# Patient Record
Sex: Female | Born: 1985 | Race: Black or African American | Hispanic: No | Smoking: Former smoker
Health system: Southern US, Community
[De-identification: ages and names within clinical notes are randomized; demographics above are authoritative.]

## PROBLEM LIST (undated history)

## (undated) ENCOUNTER — Inpatient Hospital Stay (HOSPITAL_COMMUNITY): Payer: Self-pay

## (undated) DIAGNOSIS — O139 Gestational [pregnancy-induced] hypertension without significant proteinuria, unspecified trimester: Secondary | ICD-10-CM

## (undated) DIAGNOSIS — IMO0002 Reserved for concepts with insufficient information to code with codable children: Secondary | ICD-10-CM

## (undated) DIAGNOSIS — K219 Gastro-esophageal reflux disease without esophagitis: Secondary | ICD-10-CM

## (undated) DIAGNOSIS — F319 Bipolar disorder, unspecified: Secondary | ICD-10-CM

## (undated) DIAGNOSIS — F4310 Post-traumatic stress disorder, unspecified: Secondary | ICD-10-CM

## (undated) DIAGNOSIS — N159 Renal tubulo-interstitial disease, unspecified: Secondary | ICD-10-CM

## (undated) DIAGNOSIS — J45909 Unspecified asthma, uncomplicated: Secondary | ICD-10-CM

## (undated) DIAGNOSIS — F419 Anxiety disorder, unspecified: Secondary | ICD-10-CM

## (undated) DIAGNOSIS — R87619 Unspecified abnormal cytological findings in specimens from cervix uteri: Secondary | ICD-10-CM

## (undated) DIAGNOSIS — N390 Urinary tract infection, site not specified: Secondary | ICD-10-CM

## (undated) DIAGNOSIS — F32A Depression, unspecified: Secondary | ICD-10-CM

## (undated) DIAGNOSIS — F603 Borderline personality disorder: Secondary | ICD-10-CM

## (undated) DIAGNOSIS — Z3490 Encounter for supervision of normal pregnancy, unspecified, unspecified trimester: Secondary | ICD-10-CM

## (undated) DIAGNOSIS — Z975 Presence of (intrauterine) contraceptive device: Principal | ICD-10-CM

## (undated) DIAGNOSIS — F329 Major depressive disorder, single episode, unspecified: Secondary | ICD-10-CM

## (undated) HISTORY — PX: TOOTH EXTRACTION: SUR596

## (undated) HISTORY — DX: Unspecified asthma, uncomplicated: J45.909

## (undated) HISTORY — DX: Gastro-esophageal reflux disease without esophagitis: K21.9

## (undated) HISTORY — DX: Borderline personality disorder: F60.3

## (undated) HISTORY — DX: Morbid (severe) obesity due to excess calories: E66.01

## (undated) HISTORY — DX: Gestational (pregnancy-induced) hypertension without significant proteinuria, unspecified trimester: O13.9

## (undated) HISTORY — DX: Post-traumatic stress disorder, unspecified: F43.10

## (undated) HISTORY — DX: Presence of (intrauterine) contraceptive device: Z97.5

---
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
## 2007-03-29 ENCOUNTER — Emergency Department (HOSPITAL_COMMUNITY): Admission: EM | Admit: 2007-03-29 | Discharge: 2007-03-29 | Payer: Self-pay | Admitting: Emergency Medicine

## 2007-10-26 ENCOUNTER — Other Ambulatory Visit: Admission: RE | Admit: 2007-10-26 | Discharge: 2007-10-26 | Payer: Self-pay | Admitting: Obstetrics and Gynecology

## 2008-04-19 ENCOUNTER — Ambulatory Visit (HOSPITAL_COMMUNITY): Admission: RE | Admit: 2008-04-19 | Discharge: 2008-04-19 | Payer: Self-pay | Admitting: Obstetrics & Gynecology

## 2008-04-30 ENCOUNTER — Inpatient Hospital Stay (HOSPITAL_COMMUNITY): Admission: AD | Admit: 2008-04-30 | Discharge: 2008-05-03 | Payer: Self-pay | Admitting: Obstetrics and Gynecology

## 2008-11-07 ENCOUNTER — Other Ambulatory Visit: Admission: RE | Admit: 2008-11-07 | Discharge: 2008-11-07 | Payer: Self-pay | Admitting: Obstetrics and Gynecology

## 2008-12-20 ENCOUNTER — Emergency Department (HOSPITAL_COMMUNITY): Admission: EM | Admit: 2008-12-20 | Discharge: 2008-12-20 | Payer: Self-pay | Admitting: Emergency Medicine

## 2009-01-10 ENCOUNTER — Emergency Department (HOSPITAL_COMMUNITY): Admission: EM | Admit: 2009-01-10 | Discharge: 2009-01-10 | Payer: Self-pay | Admitting: Emergency Medicine

## 2009-01-13 ENCOUNTER — Encounter (HOSPITAL_COMMUNITY): Admission: RE | Admit: 2009-01-13 | Discharge: 2009-02-12 | Payer: Self-pay | Admitting: Emergency Medicine

## 2009-01-13 ENCOUNTER — Ambulatory Visit (HOSPITAL_COMMUNITY): Payer: Self-pay | Admitting: Emergency Medicine

## 2009-03-27 ENCOUNTER — Emergency Department (HOSPITAL_COMMUNITY): Admission: EM | Admit: 2009-03-27 | Discharge: 2009-03-27 | Payer: Self-pay | Admitting: Emergency Medicine

## 2009-05-16 ENCOUNTER — Emergency Department (HOSPITAL_COMMUNITY): Admission: EM | Admit: 2009-05-16 | Discharge: 2009-05-17 | Payer: Self-pay | Admitting: Emergency Medicine

## 2009-08-15 ENCOUNTER — Emergency Department (HOSPITAL_COMMUNITY): Admission: EM | Admit: 2009-08-15 | Discharge: 2009-08-15 | Payer: Self-pay | Admitting: Emergency Medicine

## 2009-11-10 ENCOUNTER — Other Ambulatory Visit: Admission: RE | Admit: 2009-11-10 | Discharge: 2009-11-10 | Payer: Self-pay | Admitting: Obstetrics and Gynecology

## 2010-02-26 ENCOUNTER — Emergency Department (HOSPITAL_COMMUNITY): Admission: EM | Admit: 2010-02-26 | Discharge: 2010-02-26 | Payer: Self-pay | Admitting: Emergency Medicine

## 2010-03-14 ENCOUNTER — Emergency Department (HOSPITAL_COMMUNITY): Admission: EM | Admit: 2010-03-14 | Discharge: 2010-03-14 | Payer: Self-pay | Admitting: Emergency Medicine

## 2010-05-21 ENCOUNTER — Emergency Department (HOSPITAL_COMMUNITY): Admission: EM | Admit: 2010-05-21 | Discharge: 2010-05-21 | Payer: Self-pay | Admitting: Emergency Medicine

## 2010-05-23 ENCOUNTER — Emergency Department (HOSPITAL_COMMUNITY): Admission: EM | Admit: 2010-05-23 | Discharge: 2010-05-23 | Payer: Self-pay | Admitting: Emergency Medicine

## 2010-05-26 ENCOUNTER — Emergency Department (HOSPITAL_COMMUNITY): Admission: EM | Admit: 2010-05-26 | Discharge: 2010-05-26 | Payer: Self-pay | Admitting: Emergency Medicine

## 2010-07-26 ENCOUNTER — Other Ambulatory Visit
Admission: RE | Admit: 2010-07-26 | Discharge: 2010-07-26 | Payer: Self-pay | Source: Home / Self Care | Admitting: Obstetrics and Gynecology

## 2010-08-12 ENCOUNTER — Encounter: Payer: Self-pay | Admitting: Emergency Medicine

## 2010-09-18 ENCOUNTER — Emergency Department (HOSPITAL_COMMUNITY): Payer: Self-pay

## 2010-09-18 ENCOUNTER — Emergency Department (HOSPITAL_COMMUNITY)
Admission: EM | Admit: 2010-09-18 | Discharge: 2010-09-19 | Disposition: A | Discharge status: Home / Self Care | Payer: Self-pay | Attending: Emergency Medicine | Admitting: Emergency Medicine

## 2010-09-18 DIAGNOSIS — R42 Dizziness and giddiness: Secondary | ICD-10-CM | POA: Insufficient documentation

## 2010-09-18 DIAGNOSIS — R079 Chest pain, unspecified: Secondary | ICD-10-CM | POA: Insufficient documentation

## 2010-09-18 DIAGNOSIS — F319 Bipolar disorder, unspecified: Secondary | ICD-10-CM | POA: Insufficient documentation

## 2010-09-18 DIAGNOSIS — Z79899 Other long term (current) drug therapy: Secondary | ICD-10-CM | POA: Insufficient documentation

## 2010-09-18 LAB — DIFFERENTIAL
Basophils Absolute: 0 10*3/uL (ref 0.0–0.1)
Basophils Relative: 0 % (ref 0–1)
Eosinophils Absolute: 0.2 10*3/uL (ref 0.0–0.7)
Eosinophils Relative: 2 % (ref 0–5)
Lymphocytes Relative: 30 % (ref 12–46)
Lymphs Abs: 3 10*3/uL (ref 0.7–4.0)
Monocytes Absolute: 0.8 10*3/uL (ref 0.1–1.0)
Monocytes Relative: 8 % (ref 3–12)
Neutro Abs: 6.1 10*3/uL (ref 1.7–7.7)
Neutrophils Relative %: 61 % (ref 43–77)

## 2010-09-18 LAB — CBC
Hemoglobin: 11.6 g/dL — ABNORMAL LOW (ref 12.0–15.0)
MCHC: 32.5 g/dL (ref 30.0–36.0)
MCV: 81.7 fL (ref 78.0–100.0)
Platelets: 256 10*3/uL (ref 150–400)
RBC: 4.37 MIL/uL (ref 3.87–5.11)

## 2010-09-18 LAB — URINALYSIS, ROUTINE W REFLEX MICROSCOPIC
Bilirubin Urine: NEGATIVE
Ketones, ur: NEGATIVE mg/dL
Nitrite: NEGATIVE
Protein, ur: NEGATIVE mg/dL
Specific Gravity, Urine: 1.02 (ref 1.005–1.030)
Urobilinogen, UA: 1 mg/dL (ref 0.0–1.0)

## 2010-09-18 LAB — POCT CARDIAC MARKERS
CKMB, poc: <1 ng/mL — ABNORMAL LOW (ref 1.0–8.0)
Myoglobin, poc: 44.2 ng/mL (ref 12–200)
Troponin i, poc: <0.05 ng/mL (ref 0.00–0.09)

## 2010-09-18 LAB — POCT PREGNANCY, URINE: Preg Test, Ur: NEGATIVE

## 2010-09-19 LAB — COMPREHENSIVE METABOLIC PANEL
ALT: 35 U/L (ref 0–35)
AST: 23 U/L (ref 0–37)
Albumin: 3.1 g/dL — ABNORMAL LOW (ref 3.5–5.2)
Alkaline Phosphatase: 70 U/L (ref 39–117)
BUN: 11 mg/dL (ref 6–23)
CO2: 27 mEq/L (ref 19–32)
Calcium: 8.9 mg/dL (ref 8.4–10.5)
Chloride: 107 mEq/L (ref 96–112)
Creatinine, Ser: 1 mg/dL (ref 0.4–1.2)
GFR calc Af Amer: >60 mL/min (ref >60)
GFR calc non Af Amer: >60 mL/min (ref >60)
Glucose, Bld: 95 mg/dL (ref 70–99)
Potassium: 3.7 mEq/L (ref 3.5–5.1)
Sodium: 141 mEq/L (ref 135–145)
Total Bilirubin: 0.3 mg/dL (ref 0.3–1.2)
Total Protein: 5.8 g/dL — ABNORMAL LOW (ref 6.0–8.3)

## 2010-10-03 ENCOUNTER — Emergency Department (HOSPITAL_COMMUNITY): Payer: Self-pay

## 2010-10-03 ENCOUNTER — Emergency Department (HOSPITAL_COMMUNITY)
Admission: EM | Admit: 2010-10-03 | Discharge: 2010-10-03 | Disposition: A | Discharge status: Home / Self Care | Payer: Self-pay | Attending: Emergency Medicine | Admitting: Emergency Medicine

## 2010-10-03 DIAGNOSIS — R110 Nausea: Secondary | ICD-10-CM | POA: Insufficient documentation

## 2010-10-03 DIAGNOSIS — H81319 Aural vertigo, unspecified ear: Secondary | ICD-10-CM | POA: Insufficient documentation

## 2010-10-03 LAB — CBC
HCT: 36.3 % (ref 36.0–46.0)
Hemoglobin: 11.8 g/dL — ABNORMAL LOW (ref 12.0–15.0)
MCH: 25.7 pg — ABNORMAL LOW (ref 26.0–34.0)
MCHC: 32.5 g/dL (ref 30.0–36.0)
MCV: 78.9 fL (ref 78.0–100.0)
Platelets: 235 10*3/uL (ref 150–400)
RBC: 4.6 MIL/uL (ref 3.87–5.11)
RDW: 14.9 % (ref 11.5–15.5)
WBC: 9.1 10*3/uL (ref 4.0–10.5)

## 2010-10-03 LAB — BASIC METABOLIC PANEL
BUN: 10 mg/dL (ref 6–23)
CO2: 28 mEq/L (ref 19–32)
Calcium: 8.8 mg/dL (ref 8.4–10.5)
Chloride: 106 mEq/L (ref 96–112)
Creatinine, Ser: 0.86 mg/dL (ref 0.4–1.2)
GFR calc Af Amer: >60 mL/min (ref >60)
GFR calc non Af Amer: >60 mL/min (ref >60)
Glucose, Bld: 82 mg/dL (ref 70–99)
Potassium: 3.7 mEq/L (ref 3.5–5.1)
Sodium: 140 mEq/L (ref 135–145)

## 2010-10-03 LAB — POCT PREGNANCY, URINE
Preg Test, Ur: NEGATIVE
Preg Test, Ur: NEGATIVE

## 2010-10-03 LAB — DIFFERENTIAL
Basophils Absolute: 0 10*3/uL (ref 0.0–0.1)
Basophils Relative: 0 % (ref 0–1)
Eosinophils Absolute: 0.1 10*3/uL (ref 0.0–0.7)
Eosinophils Relative: 1 % (ref 0–5)
Lymphocytes Relative: 21 % (ref 12–46)
Lymphs Abs: 1.9 10*3/uL (ref 0.7–4.0)
Monocytes Absolute: 0.7 10*3/uL (ref 0.1–1.0)
Monocytes Relative: 7 % (ref 3–12)
Neutro Abs: 6.5 10*3/uL (ref 1.7–7.7)
Neutrophils Relative %: 71 % (ref 43–77)

## 2010-10-03 LAB — CK TOTAL AND CKMB (NOT AT ARMC)
CK, MB: 1.5 ng/mL (ref 0.3–4.0)
Relative Index: 1.1 (ref 0.0–2.5)
Total CK: 139 U/L (ref 7–177)

## 2010-10-03 LAB — D-DIMER, QUANTITATIVE: D-Dimer, Quant: 0.42 ug/mL-FEU (ref 0.00–0.48)

## 2010-10-03 LAB — TROPONIN I: Troponin I: 0.01 ng/mL (ref 0.00–0.06)

## 2010-10-05 LAB — URINE MICROSCOPIC-ADD ON

## 2010-10-05 LAB — URINALYSIS, ROUTINE W REFLEX MICROSCOPIC
Bilirubin Urine: NEGATIVE
Bilirubin Urine: NEGATIVE
Hgb urine dipstick: NEGATIVE
Ketones, ur: NEGATIVE mg/dL
Nitrite: NEGATIVE
Specific Gravity, Urine: 1.025 (ref 1.005–1.030)
Specific Gravity, Urine: >1.03 — ABNORMAL HIGH (ref 1.005–1.030)
Urobilinogen, UA: 0.2 mg/dL (ref 0.0–1.0)
pH: 5.5 (ref 5.0–8.0)
pH: 6 (ref 5.0–8.0)

## 2010-10-05 LAB — COMPREHENSIVE METABOLIC PANEL
Alkaline Phosphatase: 82 U/L (ref 39–117)
BUN: 6 mg/dL (ref 6–23)
CO2: 30 mEq/L (ref 19–32)
Chloride: 105 mEq/L (ref 96–112)
Creatinine, Ser: 0.89 mg/dL (ref 0.4–1.2)
GFR calc non Af Amer: >60 mL/min (ref >60)
Glucose, Bld: 84 mg/dL (ref 70–99)
Potassium: 3.4 mEq/L — ABNORMAL LOW (ref 3.5–5.1)
Total Bilirubin: 0.3 mg/dL (ref 0.3–1.2)

## 2010-10-05 LAB — CBC
HCT: 39.3 % (ref 36.0–46.0)
MCH: 26.4 pg (ref 26.0–34.0)
MCV: 81.8 fL (ref 78.0–100.0)
RBC: 4.81 MIL/uL (ref 3.87–5.11)
WBC: 15.1 10*3/uL — ABNORMAL HIGH (ref 4.0–10.5)

## 2010-10-05 LAB — POCT PREGNANCY, URINE: Preg Test, Ur: NEGATIVE

## 2010-10-05 LAB — DIFFERENTIAL
Basophils Absolute: 0.1 10*3/uL (ref 0.0–0.1)
Basophils Relative: 1 % (ref 0–1)
Lymphocytes Relative: 16 % (ref 12–46)
Monocytes Absolute: 0.6 10*3/uL (ref 0.1–1.0)
Neutro Abs: 11.6 10*3/uL — ABNORMAL HIGH (ref 1.7–7.7)
Neutrophils Relative %: 77 % (ref 43–77)

## 2010-10-05 LAB — LIPASE, BLOOD: Lipase: 30 U/L (ref 11–59)

## 2010-10-07 LAB — CBC
HCT: 39.4 % (ref 36.0–46.0)
MCHC: 32.7 g/dL (ref 30.0–36.0)
MCV: 81.3 fL (ref 78.0–100.0)
Platelets: 231 10*3/uL (ref 150–400)
WBC: 9.5 10*3/uL (ref 4.0–10.5)

## 2010-10-07 LAB — URINE CULTURE: Colony Count: >=100000

## 2010-10-07 LAB — DIFFERENTIAL
Basophils Absolute: 0 10*3/uL (ref 0.0–0.1)
Lymphocytes Relative: 20 % (ref 12–46)
Neutro Abs: 7 10*3/uL (ref 1.7–7.7)

## 2010-10-07 LAB — COMPREHENSIVE METABOLIC PANEL
BUN: 9 mg/dL (ref 6–23)
CO2: 28 mEq/L (ref 19–32)
Chloride: 108 mEq/L (ref 96–112)
Creatinine, Ser: 0.83 mg/dL (ref 0.4–1.2)
GFR calc non Af Amer: >60 mL/min (ref >60)
Total Bilirubin: 0.3 mg/dL (ref 0.3–1.2)

## 2010-10-07 LAB — PREGNANCY, URINE: Preg Test, Ur: NEGATIVE

## 2010-10-07 LAB — URINALYSIS, ROUTINE W REFLEX MICROSCOPIC
Glucose, UA: NEGATIVE mg/dL
Ketones, ur: NEGATIVE mg/dL
Urobilinogen, UA: 0.2 mg/dL (ref 0.0–1.0)

## 2010-10-25 LAB — DIFFERENTIAL
Eosinophils Absolute: 0.1 10*3/uL (ref 0.0–0.7)
Eosinophils Relative: 1 % (ref 0–5)
Lymphs Abs: 1.2 10*3/uL (ref 0.7–4.0)
Monocytes Absolute: 0.3 10*3/uL (ref 0.1–1.0)

## 2010-10-25 LAB — BASIC METABOLIC PANEL
BUN: 10 mg/dL (ref 6–23)
Chloride: 109 mEq/L (ref 96–112)
Glucose, Bld: 93 mg/dL (ref 70–99)
Potassium: 3.9 mEq/L (ref 3.5–5.1)

## 2010-10-25 LAB — URINALYSIS, ROUTINE W REFLEX MICROSCOPIC
Bilirubin Urine: NEGATIVE
Glucose, UA: NEGATIVE mg/dL
Ketones, ur: NEGATIVE mg/dL
pH: 5.5 (ref 5.0–8.0)

## 2010-10-25 LAB — CBC
HCT: 44.6 % (ref 36.0–46.0)
MCV: 80.7 fL (ref 78.0–100.0)
Platelets: 233 10*3/uL (ref 150–400)
WBC: 12.8 10*3/uL — ABNORMAL HIGH (ref 4.0–10.5)

## 2010-10-26 LAB — URINALYSIS, ROUTINE W REFLEX MICROSCOPIC
Bilirubin Urine: NEGATIVE
Glucose, UA: NEGATIVE mg/dL
Ketones, ur: NEGATIVE mg/dL
Leukocytes, UA: NEGATIVE
Protein, ur: NEGATIVE mg/dL

## 2010-10-26 LAB — URINE MICROSCOPIC-ADD ON

## 2010-10-26 LAB — URINE CULTURE: Colony Count: 35000

## 2010-10-29 LAB — URINALYSIS, ROUTINE W REFLEX MICROSCOPIC
Nitrite: NEGATIVE
Specific Gravity, Urine: >1.03 — ABNORMAL HIGH (ref 1.005–1.030)
Urobilinogen, UA: 0.2 mg/dL (ref 0.0–1.0)

## 2010-10-29 LAB — URINE CULTURE

## 2010-10-29 LAB — URINE MICROSCOPIC-ADD ON

## 2010-10-29 LAB — PREGNANCY, URINE: Preg Test, Ur: NEGATIVE

## 2010-11-29 ENCOUNTER — Emergency Department (HOSPITAL_COMMUNITY)
Admission: EM | Admit: 2010-11-29 | Discharge: 2010-11-30 | Disposition: A | Discharge status: Home / Self Care | Payer: Self-pay | Attending: Emergency Medicine | Admitting: Emergency Medicine

## 2010-11-29 DIAGNOSIS — R110 Nausea: Secondary | ICD-10-CM | POA: Insufficient documentation

## 2010-11-29 DIAGNOSIS — R51 Headache: Secondary | ICD-10-CM | POA: Insufficient documentation

## 2010-11-29 LAB — URINALYSIS, ROUTINE W REFLEX MICROSCOPIC
Nitrite: POSITIVE — AB
Protein, ur: NEGATIVE mg/dL
Specific Gravity, Urine: >1.03 — ABNORMAL HIGH (ref 1.005–1.030)
Urobilinogen, UA: 0.2 mg/dL (ref 0.0–1.0)

## 2010-11-29 LAB — URINE MICROSCOPIC-ADD ON

## 2010-11-29 LAB — PREGNANCY, URINE: Preg Test, Ur: NEGATIVE

## 2010-12-04 NOTE — Op Note (Signed)
NAME:  BALJIT, LIEBERT               ACCOUNT NO.:  [id number]   MEDICAL RECORD NO.:  [id number]          PATIENT TYPE:  INP   LOCATION:  9126                          FACILITY:  [facility]   PHYSICIAN:  Tilda Burrow, M.D. DATE OF BIRTH:  1985/08/15   DATE OF PROCEDURE:  05/01/2008  DATE OF DISCHARGE:                               OPERATIVE REPORT   LABOR SUMMARY AND DELIVERY NOTE   Ms. Sachs was admitted last night for Cytotec cervical ripening with a  very firm cervix at 40 weeks 6 days for induction.  This morning, she  had made an excellent progress in response to Cytotec and progressed to  labor in a steady fashion.  She received IV Stadol and Phenergan 12.5 mg  x1 at 8 a.m.  Labor was noted for some decreased beat-to-beat  variability.  During active phase, labor with no decelerations.  Fetal  scalp stimulation always resulted in good fetal heart rate changes with  10 x 10 fetal accelerations noted.  She progressed to completely dilated  and pushed through a 35-minute second stage delivering over a deep  second-degree laceration with mild shoulder dystocia, delivering a 9-  pound 5-ounce female infant, Apgars of 8 and 9.  Dystocia was relieved  by identifying the infant's right shoulder, which was posteriorly  located and already in the posterior vaginal vault in combining the  McRobert maneuver with a counter clockwise with screw maneuver using  axillary traction to rotate the vertex steadily.  The baby delivered  easily.  There was significant partial third-degree perineal laceration  that extended well up to vagina.   The repair consisted of firstly delivering the placenta, intact Tomasa Blase  presentation.  Cord blood was collected by the [facility]  representative.   The repair consisted of using Allis clamps to identify the anal  sphincter capsule and repairing it with 3 interrupted figure-of-eight  sutures around the capsule and then a single suture in the center of  the  perineal body.  The remainder of the vaginal repair consisted of  standard two-layer repair beginning at the apex of the laceration well  up to the posterior vaginal wall just to the left of the midline pulling  the tissues together with a running suture.  The perineal body itself  was a two-layer closure with a running 0-Vicryl, and at the end, tissue  approximation was quite good.  EBL was 500 mL.  The patient tolerated  the procedure well under local anesthesia.   ADDENDUM:  Baby had a blood sugar of 36 in the recovery and was attempted early  breast-feeding unsuccessfully, so we took the baby to the nursery for  early feeding.  Baby was noted to have an Erb's palsy in Nursery, with  strong wrist and hand movement and grip, to be followed for resolution.  10.15/2009  Patient will be a candidate for treatment like a gestational diabetic in  future pregnancies; she weighed 10+ pounds at birth, her father is huge,  6'6 and 400plus pounds. Her 1hr GTT was wnl at 28 wks.  Would check 1hr  gtt at 34 wks in future pregnancies and 38 wk u/s for efwt.      Tilda Burrow, M.D.  Electronically Signed     JVF/MEDQ  D:  05/01/2008  T:  05/02/2008  Job:  [id number]   cc:   Francoise Schaumann. Raynelle Highland  Fax: [phone]   [facility]

## 2010-12-08 ENCOUNTER — Emergency Department (HOSPITAL_COMMUNITY)
Admission: EM | Admit: 2010-12-08 | Discharge: 2010-12-08 | Disposition: A | Discharge status: Home / Self Care | Payer: Self-pay | Attending: Emergency Medicine | Admitting: Emergency Medicine

## 2010-12-08 DIAGNOSIS — R112 Nausea with vomiting, unspecified: Secondary | ICD-10-CM | POA: Insufficient documentation

## 2010-12-08 DIAGNOSIS — K089 Disorder of teeth and supporting structures, unspecified: Secondary | ICD-10-CM | POA: Insufficient documentation

## 2011-02-28 ENCOUNTER — Inpatient Hospital Stay (INDEPENDENT_AMBULATORY_CARE_PROVIDER_SITE_OTHER)
Admission: RE | Admit: 2011-02-28 | Discharge: 2011-02-28 | Disposition: A | Discharge status: Home / Self Care | Payer: Self-pay | Source: Ambulatory Visit | Attending: Family Medicine | Admitting: Family Medicine

## 2011-02-28 DIAGNOSIS — N390 Urinary tract infection, site not specified: Secondary | ICD-10-CM

## 2011-02-28 LAB — POCT URINALYSIS DIP (DEVICE)
Bilirubin Urine: NEGATIVE
Glucose, UA: NEGATIVE mg/dL
Ketones, ur: NEGATIVE mg/dL
Specific Gravity, Urine: 1.02 (ref 1.005–1.030)

## 2011-03-02 LAB — URINE CULTURE
Colony Count: >=100000
Culture  Setup Time: 201208091949

## 2011-04-23 LAB — RH IMMUNE GLOB WKUP(>/=20WKS)(NOT WOMEN'S HOSP): Fetal Screen: NEGATIVE

## 2011-04-23 LAB — RPR: RPR Ser Ql: NONREACTIVE

## 2011-04-23 LAB — CBC
HCT: 26.6 — ABNORMAL LOW
HCT: 35.6 — ABNORMAL LOW
Hemoglobin: 8.7 — ABNORMAL LOW
MCHC: 32.7
MCHC: 32.7
MCV: 81
Platelets: 162
Platelets: 210
RDW: 15.5

## 2011-05-01 ENCOUNTER — Inpatient Hospital Stay (INDEPENDENT_AMBULATORY_CARE_PROVIDER_SITE_OTHER)
Admission: RE | Admit: 2011-05-01 | Discharge: 2011-05-01 | Disposition: A | Discharge status: Home / Self Care | Payer: Self-pay | Source: Ambulatory Visit | Attending: Family Medicine | Admitting: Family Medicine

## 2011-05-01 DIAGNOSIS — J069 Acute upper respiratory infection, unspecified: Secondary | ICD-10-CM

## 2011-05-01 LAB — POCT URINALYSIS DIP (DEVICE)
Glucose, UA: NEGATIVE mg/dL
Hgb urine dipstick: NEGATIVE
Ketones, ur: NEGATIVE mg/dL
Specific Gravity, Urine: 1.02 (ref 1.005–1.030)
Urobilinogen, UA: 0.2 mg/dL (ref 0.0–1.0)

## 2011-05-01 LAB — POCT PREGNANCY, URINE: Preg Test, Ur: NEGATIVE

## 2011-05-03 LAB — URINALYSIS, ROUTINE W REFLEX MICROSCOPIC
Nitrite: NEGATIVE
Specific Gravity, Urine: >1.03 — ABNORMAL HIGH
Urobilinogen, UA: 0.2
pH: 6

## 2011-05-03 LAB — PREGNANCY, URINE: Preg Test, Ur: NEGATIVE

## 2011-05-13 ENCOUNTER — Inpatient Hospital Stay (INDEPENDENT_AMBULATORY_CARE_PROVIDER_SITE_OTHER)
Admission: RE | Admit: 2011-05-13 | Discharge: 2011-05-13 | Disposition: A | Discharge status: Home / Self Care | Payer: Self-pay | Source: Ambulatory Visit | Attending: Emergency Medicine | Admitting: Emergency Medicine

## 2011-05-13 DIAGNOSIS — Z331 Pregnant state, incidental: Secondary | ICD-10-CM

## 2011-05-13 DIAGNOSIS — J3489 Other specified disorders of nose and nasal sinuses: Secondary | ICD-10-CM

## 2011-05-13 LAB — POCT PREGNANCY, URINE: Preg Test, Ur: POSITIVE

## 2011-05-14 ENCOUNTER — Emergency Department (HOSPITAL_COMMUNITY)
Admission: EM | Admit: 2011-05-14 | Discharge: 2011-05-14 | Disposition: A | Discharge status: Home / Self Care | Payer: Self-pay | Attending: Emergency Medicine | Admitting: Emergency Medicine

## 2011-05-14 DIAGNOSIS — F319 Bipolar disorder, unspecified: Secondary | ICD-10-CM | POA: Insufficient documentation

## 2011-05-14 DIAGNOSIS — R109 Unspecified abdominal pain: Secondary | ICD-10-CM | POA: Insufficient documentation

## 2011-05-14 DIAGNOSIS — O99891 Other specified diseases and conditions complicating pregnancy: Secondary | ICD-10-CM | POA: Insufficient documentation

## 2011-05-14 DIAGNOSIS — L299 Pruritus, unspecified: Secondary | ICD-10-CM | POA: Insufficient documentation

## 2011-05-14 DIAGNOSIS — L500 Allergic urticaria: Secondary | ICD-10-CM | POA: Insufficient documentation

## 2011-05-14 DIAGNOSIS — F411 Generalized anxiety disorder: Secondary | ICD-10-CM | POA: Insufficient documentation

## 2011-05-20 ENCOUNTER — Emergency Department (HOSPITAL_COMMUNITY)
Admission: EM | Admit: 2011-05-20 | Discharge: 2011-05-20 | Disposition: A | Discharge status: Home / Self Care | Payer: Medicaid Other | Attending: Emergency Medicine | Admitting: Emergency Medicine

## 2011-05-20 DIAGNOSIS — Z3201 Encounter for pregnancy test, result positive: Secondary | ICD-10-CM | POA: Insufficient documentation

## 2011-05-20 DIAGNOSIS — O219 Vomiting of pregnancy, unspecified: Secondary | ICD-10-CM | POA: Insufficient documentation

## 2011-05-20 LAB — URINALYSIS, ROUTINE W REFLEX MICROSCOPIC
Bilirubin Urine: NEGATIVE
Ketones, ur: NEGATIVE mg/dL
Leukocytes, UA: NEGATIVE
Nitrite: NEGATIVE
Protein, ur: NEGATIVE mg/dL
Urobilinogen, UA: 1 mg/dL (ref 0.0–1.0)

## 2011-05-20 LAB — POCT PREGNANCY, URINE: Preg Test, Ur: POSITIVE

## 2011-05-21 ENCOUNTER — Inpatient Hospital Stay (HOSPITAL_COMMUNITY): Payer: Medicaid Other

## 2011-05-21 ENCOUNTER — Inpatient Hospital Stay (HOSPITAL_COMMUNITY)
Admission: AD | Admit: 2011-05-21 | Discharge: 2011-05-21 | Disposition: A | Discharge status: Home / Self Care | Payer: Medicaid Other | Source: Ambulatory Visit | Attending: Obstetrics and Gynecology | Admitting: Obstetrics and Gynecology

## 2011-05-21 ENCOUNTER — Encounter (HOSPITAL_COMMUNITY): Payer: Self-pay

## 2011-05-21 DIAGNOSIS — O26899 Other specified pregnancy related conditions, unspecified trimester: Secondary | ICD-10-CM

## 2011-05-21 DIAGNOSIS — O99891 Other specified diseases and conditions complicating pregnancy: Secondary | ICD-10-CM | POA: Insufficient documentation

## 2011-05-21 DIAGNOSIS — R109 Unspecified abdominal pain: Secondary | ICD-10-CM | POA: Insufficient documentation

## 2011-05-21 LAB — HCG, QUANTITATIVE, PREGNANCY: hCG, Beta Chain, Quant, S: 10010 m[IU]/mL — ABNORMAL HIGH (ref <5)

## 2011-05-21 LAB — WET PREP, GENITAL
Trich, Wet Prep: NONE SEEN
Yeast Wet Prep HPF POC: NONE SEEN

## 2011-05-21 NOTE — Progress Notes (Signed)
Pt states was seen at [facility] yesterday afternoon, told she was pregnant, cultures completed, pt states pain only when voiding, noted spotting since in MAU. Rates 8/10. Denies burning, does have urgency with voiding. No abnormal vaginal d/c changes. Pain began this am.

## 2011-05-21 NOTE — ED Provider Notes (Signed)
History   Pt presents today c/o possible UTI. She states she has noticed lower abd pain and pressure with urination since about 2am today. She denies vag bleeding but states her urine is an orange/red color from taking pyridium. She denies fever. She states she has had recent intercourse. She also c/o some vag dc with irritation.  Chief Complaint  Patient presents with  . Back Pain   HPI  OB History    Grav Para Term Preterm Abortions TAB SAB Ect Mult Living   2 1 1  0 0 0 0 0 0 1      History reviewed. No pertinent past medical history.  Past Surgical History  Procedure Date  . Tooth extraction     No family history on file.  History  Substance Use Topics  . Smoking status: Former Smoker    Quit date: 05/21/2007  . Smokeless tobacco: Never Used  . Alcohol Use: Yes     none in 2 weeks    Allergies:  Allergies  Allergen Reactions  . Shellfish Allergy Anaphylaxis and Rash    No prescriptions prior to admission    Review of Systems  Constitutional: Negative for fever.  Cardiovascular: Negative for chest pain.  Gastrointestinal: Positive for abdominal pain. Negative for nausea, vomiting, diarrhea and constipation.  Genitourinary: Positive for urgency and frequency. Negative for dysuria, hematuria and flank pain.  Neurological: Negative for dizziness and headaches.  Psychiatric/Behavioral: Negative for depression and suicidal ideas.   Physical Exam   Blood pressure 121/82, pulse 101, temperature 97.1 F (36.2 C), temperature source Oral, resp. rate 18, height 5\' 6"  (1.676 m), weight 301 lb 6 oz (136.703 kg), last menstrual period 03/06/2011.  Physical Exam  Nursing note and vitals reviewed. Constitutional: She is oriented to person, place, and time. She appears well-developed and well-nourished. No distress.  HENT:  Head: Normocephalic and atraumatic.  Eyes: EOM are normal. Pupils are equal, round, and reactive to light.  GI: Soft. She exhibits no distension.  There is tenderness (Suprapubic tenderness noted with palpation.). There is no rebound and no guarding.  Genitourinary: No bleeding around the vagina. Vaginal discharge found.       Bimanual exam difficult secondary to increased body habitus.  Neurological: She is alert and oriented to person, place, and time.  Skin: Skin is warm and dry. She is not diaphoretic.  Psychiatric: She has a normal mood and affect. Her behavior is normal. Judgment and thought content normal.    MAU Course  Procedures  Wet prep and GC/Chlamydia cultures done.  Results for orders placed during the hospital encounter of 05/21/11 (from the past 24 hour(s))  HCG, QUANTITATIVE, PREGNANCY     Status: Abnormal   Collection Time   05/21/11  7:45 AM      Component Value Range   hCG, Beta Chain, Quant, S 10010 (*) <5 (mIU/mL)  ABO/RH     Status: Normal   Collection Time   05/21/11  7:45 AM      Component Value Range   ABO/RH(D) O NEG    WET PREP, GENITAL     Status: Abnormal   Collection Time   05/21/11  8:37 AM      Component Value Range   Yeast, Wet Prep NONE SEEN  NONE SEEN    Trich, Wet Prep NONE SEEN  NONE SEEN    Clue Cells, Wet Prep FEW (*) NONE SEEN    WBC, Wet Prep HPF POC FEW (*) NONE SEEN  US shows early IUP with yolk sac consistent with about 6wks. Assessment and Plan  Abd pain in preg: discussed with pt at length. Pt has an early IUP. She is to begin prenatal care. Discussed diet, activity, risks, and precautions.  Clinton Gallant. Karlea Mckibbin III, DrHSc, MPAS, PA-C  05/21/2011, 8:20 AM   Henrietta Hoover, PA 05/21/11 [phone]

## 2011-05-22 LAB — GC/CHLAMYDIA PROBE AMP, GENITAL
Chlamydia, DNA Probe: NEGATIVE
GC Probe Amp, Genital: NEGATIVE

## 2011-05-22 LAB — URINE CULTURE: Colony Count: 15000

## 2011-05-24 ENCOUNTER — Encounter (HOSPITAL_COMMUNITY): Payer: Self-pay

## 2011-06-04 ENCOUNTER — Encounter (HOSPITAL_COMMUNITY): Payer: Self-pay

## 2011-06-18 ENCOUNTER — Inpatient Hospital Stay (HOSPITAL_COMMUNITY)
Admission: AD | Admit: 2011-06-18 | Discharge: 2011-06-18 | Disposition: A | Discharge status: Home / Self Care | Payer: Medicaid Other | Source: Ambulatory Visit | Attending: Obstetrics & Gynecology | Admitting: Obstetrics & Gynecology

## 2011-06-18 ENCOUNTER — Encounter (HOSPITAL_COMMUNITY): Payer: Self-pay | Admitting: *Deleted

## 2011-06-18 DIAGNOSIS — K219 Gastro-esophageal reflux disease without esophagitis: Secondary | ICD-10-CM | POA: Insufficient documentation

## 2011-06-18 DIAGNOSIS — O99891 Other specified diseases and conditions complicating pregnancy: Secondary | ICD-10-CM | POA: Insufficient documentation

## 2011-06-18 HISTORY — DX: Unspecified abnormal cytological findings in specimens from cervix uteri: R87.619

## 2011-06-18 HISTORY — DX: Bipolar disorder, unspecified: F31.9

## 2011-06-18 HISTORY — DX: Major depressive disorder, single episode, unspecified: F32.9

## 2011-06-18 HISTORY — DX: Anxiety disorder, unspecified: F41.9

## 2011-06-18 HISTORY — DX: Depression, unspecified: F32.A

## 2011-06-18 HISTORY — DX: Reserved for concepts with insufficient information to code with codable children: IMO0002

## 2011-06-18 LAB — URINALYSIS, ROUTINE W REFLEX MICROSCOPIC
Bilirubin Urine: NEGATIVE
Ketones, ur: 15 mg/dL — AB
Nitrite: POSITIVE — AB
Specific Gravity, Urine: 1.02 (ref 1.005–1.030)
Urobilinogen, UA: 1 mg/dL (ref 0.0–1.0)
pH: 6 (ref 5.0–8.0)

## 2011-06-18 LAB — URINE MICROSCOPIC-ADD ON

## 2011-06-18 MED ORDER — PROMETHAZINE HCL 25 MG PO TABS: 25.0000 mg | ORAL_TABLET | Freq: Once | ORAL | Status: DC

## 2011-06-18 MED ORDER — PROMETHAZINE HCL 25 MG PO TABS: 25.0000 mg | ORAL_TABLET | Freq: Four times a day (QID) | ORAL | Status: DC | PRN

## 2011-06-18 MED ORDER — OMEPRAZOLE 20 MG PO CPDR: 20.0000 mg | DELAYED_RELEASE_CAPSULE | Freq: Every day | ORAL | Status: DC

## 2011-06-18 MED ORDER — ONDANSETRON 4 MG PO TBDP
4.0000 mg | ORAL_TABLET | Freq: Once | ORAL | Status: AC
  Administered 2011-06-18: 4 mg via ORAL
  Filled 2011-06-18: qty 1

## 2011-06-18 NOTE — Progress Notes (Signed)
About 10days ago, was taking an acid reducer, that was helping keep food down.  Was helping for a while.  Then 2 days ago, got sick of it and decided I wasn't going to eat. Been throwing up since last night.  Sore throat and cough started day before yesterday, non-prod cough. Coughing gags her.

## 2011-06-18 NOTE — Progress Notes (Signed)
Denies fever or chills.  Though sore throat and cough was from throwing up

## 2011-06-18 NOTE — ED Provider Notes (Signed)
History     No chief complaint on file.  HPI This is a G2 P1 001 at 10 weeks who presents with ferning in her throat, nausea, vomiting that started approximately 10 days ago. She has been treated with over-the-counter medicine for acid reflux with ranitidine with good success until about 2 days ago. Medicine stopped working and she developed vomiting since that time. She is not able to keep food down but has tried different foods, drinking milk, and other remedies to no avail. She denies fevers, chills, diarrhea, constipation, body aches, muscle aches. She does have dry cough and scratchy throat that started approximately same time as her uncontrolled reflux.  OB History    Grav Para Term Preterm Abortions TAB SAB Ect Mult Living   2 1 1  0 0 0 0 0 0 1      Past Medical History  Diagnosis Date  . Abnormal Pap smear   . Anxiety   . Depression   . Bipolar 1 disorder     Past Surgical History  Procedure Date  . Tooth extraction     No family history on file.  History  Substance Use Topics  . Smoking status: Former Smoker    Quit date: 05/21/2007  . Smokeless tobacco: Never Used  . Alcohol Use: Yes     none in 2 weeks    Allergies:  Allergies  Allergen Reactions  . Shellfish Allergy Anaphylaxis and Rash    Prescriptions prior to admission  Medication Sig Dispense Refill  . folic acid (FOLVITE) 400 MCG tablet Take 400 mcg by mouth daily.        . Mirtazapine (REMERON PO) Take 1 tablet by mouth daily. Pt's states that she is on the lowest dose, goes to [facility].  Cannot verify at this time due to them being closed. Will follow up       . multivitamin (BARIATRIC VIT W/EXTRA C) CHEW Chew 2 tablets by mouth daily.          Review of Systems  All other systems reviewed and are negative.   Physical Exam   Blood pressure 104/75, pulse 131, temperature 99.2 F (37.3 C), temperature source Oral, resp. rate 24, height 5' 4.5" (1.638 m), weight 129.729 kg (286 lb), last  menstrual period 03/06/2011, SpO2 96.00%.  Physical Exam  Constitutional: She is oriented to person, place, and time. She appears well-developed and well-nourished.  HENT:  Head: Normocephalic and atraumatic.  Eyes: Pupils are equal, round, and reactive to light.  Neck:       Cobblestoning  Cardiovascular: Normal rate, regular rhythm and normal heart sounds.   Respiratory: Effort normal and breath sounds normal. No respiratory distress. She has no wheezes. She has no rales. She exhibits no tenderness.  GI: Soft. Bowel sounds are normal. She exhibits no distension and no mass. There is no tenderness. There is no rebound and no guarding.  Musculoskeletal: Normal range of motion.  Neurological: She is alert and oriented to person, place, and time.  Skin: Skin is warm and dry.    MAU Course  Procedures   Assessment and Plan  #50 25 year old G2 P1 at 10 weeks #2 gastroesophageal reflux disease  The patient was given Zofran ODT to help with nausea. Patient tolerated oral liquids without difficulty. Also the patient home with Phenergan tablets, as well as omeprazole as the H2-blocker has become ineffective. She will be calling for an initial prenatal care appointments in the near future.  Jerry Haugen JEHIEL  06/18/2011, 1:06 PM

## 2011-06-28 ENCOUNTER — Inpatient Hospital Stay (HOSPITAL_COMMUNITY): Payer: Medicaid Other

## 2011-06-28 ENCOUNTER — Encounter (HOSPITAL_COMMUNITY): Payer: Self-pay | Admitting: *Deleted

## 2011-06-28 ENCOUNTER — Inpatient Hospital Stay (HOSPITAL_COMMUNITY)
Admission: AD | Admit: 2011-06-28 | Discharge: 2011-06-28 | Disposition: A | Discharge status: Home / Self Care | Payer: Medicaid Other | Source: Ambulatory Visit | Attending: Family Medicine | Admitting: Family Medicine

## 2011-06-28 DIAGNOSIS — N390 Urinary tract infection, site not specified: Secondary | ICD-10-CM

## 2011-06-28 DIAGNOSIS — O039 Complete or unspecified spontaneous abortion without complication: Secondary | ICD-10-CM | POA: Insufficient documentation

## 2011-06-28 DIAGNOSIS — O034 Incomplete spontaneous abortion without complication: Secondary | ICD-10-CM

## 2011-06-28 LAB — CBC
HCT: 34.6 % — ABNORMAL LOW (ref 36.0–46.0)
Hemoglobin: 11.6 g/dL — ABNORMAL LOW (ref 12.0–15.0)
MCH: 24.6 pg — ABNORMAL LOW (ref 26.0–34.0)
MCV: 73.5 fL — ABNORMAL LOW (ref 78.0–100.0)
Platelets: 125 10*3/uL — ABNORMAL LOW (ref 150–400)
RBC: 4.71 MIL/uL (ref 3.87–5.11)
WBC: 13.6 10*3/uL — ABNORMAL HIGH (ref 4.0–10.5)

## 2011-06-28 LAB — URINALYSIS, ROUTINE W REFLEX MICROSCOPIC
Bilirubin Urine: NEGATIVE
Glucose, UA: NEGATIVE mg/dL
Specific Gravity, Urine: 1.025 (ref 1.005–1.030)

## 2011-06-28 LAB — URINE MICROSCOPIC-ADD ON

## 2011-06-28 MED ORDER — CEPHALEXIN 500 MG PO CAPS: 500.0000 mg | ORAL_CAPSULE | Freq: Three times a day (TID) | ORAL | Status: AC

## 2011-06-28 MED ORDER — MISOPROSTOL 200 MCG PO TABS
600.0000 ug | ORAL_TABLET | Freq: Once | ORAL | Status: AC
  Administered 2011-06-28: 600 ug via VAGINAL
  Filled 2011-06-28: qty 3

## 2011-06-28 MED ORDER — OXYCODONE-ACETAMINOPHEN 5-325 MG PO TABS: 1.0000 | ORAL_TABLET | Freq: Four times a day (QID) | ORAL | Status: DC | PRN

## 2011-06-28 MED ORDER — HYDROMORPHONE HCL PF 1 MG/ML IJ SOLN
1.0000 mg | Freq: Once | INTRAMUSCULAR | Status: AC
  Administered 2011-06-28: 1 mg via INTRAMUSCULAR
  Filled 2011-06-28: qty 1

## 2011-06-28 MED ORDER — RHO D IMMUNE GLOBULIN 1500 UNIT/2ML IJ SOLN
300.0000 ug | Freq: Once | INTRAMUSCULAR | Status: AC
  Administered 2011-06-28: 300 ug via INTRAMUSCULAR

## 2011-06-28 MED ORDER — IBUPROFEN 800 MG PO TABS: 800.0000 mg | ORAL_TABLET | Freq: Three times a day (TID) | ORAL | Status: AC | PRN

## 2011-06-28 MED ORDER — ONDANSETRON 8 MG PO TBDP
8.0000 mg | ORAL_TABLET | Freq: Once | ORAL | Status: AC
  Administered 2011-06-28: 8 mg via ORAL
  Filled 2011-06-28: qty 1

## 2011-06-28 NOTE — ED Provider Notes (Signed)
History     Chief Complaint  Patient presents with  . Vaginal Bleeding   HPI 25 y.o. G2P1001 at [redacted]w[redacted]d with heavy vaginal bleeding, onset about 1 hour ago, c/o ongoing low abd and back pain, which she states has been present throughout this pregnancy. Blood type O neg.     Past Medical History  Diagnosis Date  . Abnormal Pap smear   . Anxiety   . Depression   . Bipolar 1 disorder     Past Surgical History  Procedure Date  . Tooth extraction     No family history on file.  History  Substance Use Topics  . Smoking status: Former Smoker    Quit date: 05/21/2007  . Smokeless tobacco: Never Used  . Alcohol Use: Yes     none in 2 weeks    Allergies:  Allergies  Allergen Reactions  . Shellfish Allergy Anaphylaxis and Rash    Prescriptions prior to admission  Medication Sig Dispense Refill  . multivitamin (BARIATRIC VIT W/EXTRA C) CHEW Chew 2 tablets by mouth daily.        Marland Kitchen omeprazole (PRILOSEC) 20 MG capsule Take 1 capsule (20 mg total) by mouth daily.  30 capsule  0    Review of Systems  Constitutional: Negative.   Respiratory: Negative.   Cardiovascular: Negative.   Gastrointestinal: Positive for abdominal pain. Negative for nausea, vomiting, diarrhea and constipation.  Genitourinary: Negative for dysuria, urgency, frequency, hematuria and flank pain.       Positive for vaginal bleeding  Musculoskeletal: Positive for back pain.  Neurological: Negative.   Psychiatric/Behavioral: Negative.    Physical Exam   Blood pressure 113/81, pulse 127, temperature 100.1 F (37.8 C), temperature source Oral, resp. rate 20, last menstrual period 03/06/2011, SpO2 95.00%.  Physical Exam  Nursing note and vitals reviewed. Constitutional: She appears well-developed and well-nourished. No distress.  Cardiovascular: Normal rate.   Respiratory: Effort normal.  GI: Soft.  Genitourinary: There is no tenderness or lesion on the right labia. There is no tenderness or lesion on  the left labia. There is bleeding (clots removed from cervix, moderate amout of bleeding, no active bleeding after removal of clots from cervix) around the vagina.       Cervix open about 1 cm    MAU Course  Procedures  Results for orders placed during the hospital encounter of 06/28/11 (from the past 24 hour(s))  CBC     Status: Abnormal   Collection Time   06/28/11  3:30 AM      Component Value Range   WBC 13.6 (*) 4.0 - 10.5 (K/uL)   RBC 4.71  3.87 - 5.11 (MIL/uL)   Hemoglobin 11.6 (*) 12.0 - 15.0 (g/dL)   HCT 96.0 (*) 45.4 - 46.0 (%)   MCV 73.5 (*) 78.0 - 100.0 (fL)   MCH 24.6 (*) 26.0 - 34.0 (pg)   MCHC 33.5  30.0 - 36.0 (g/dL)   RDW 09.8  11.9 - 14.7 (%)   Platelets 125 (*) 150 - 400 (K/uL)  RH IG WORKUP, [facility]     Status: Normal (Preliminary result)   Collection Time   06/28/11  3:30 AM      Component Value Range   Gestational Age(Wks) 11.3     ABO/RH(D) O NEG     Antibody Screen NEG     Unit Number [id number]     Blood Component Type RHIG     Unit division 00     Status of Unit  ISSUED     Transfusion Status OK TO TRANSFUSE    URINALYSIS, ROUTINE W REFLEX MICROSCOPIC     Status: Abnormal   Collection Time   06/28/11  4:20 AM      Component Value Range   Color, Urine YELLOW  YELLOW    APPearance CLEAR  CLEAR    Specific Gravity, Urine 1.025  1.005 - 1.030    pH 5.5  5.0 - 8.0    Glucose, UA NEGATIVE  NEGATIVE (mg/dL)   Hgb urine dipstick MODERATE (*) NEGATIVE    Bilirubin Urine NEGATIVE  NEGATIVE    Ketones, ur NEGATIVE  NEGATIVE (mg/dL)   Protein, ur 578 (*) NEGATIVE (mg/dL)   Urobilinogen, UA 4.0 (*) 0.0 - 1.0 (mg/dL)   Nitrite POSITIVE (*) NEGATIVE    Leukocytes, UA MODERATE (*) NEGATIVE   URINE MICROSCOPIC-ADD ON     Status: Abnormal   Collection Time   06/28/11  4:20 AM      Component Value Range   Squamous Epithelial / LPF RARE  RARE    WBC, UA TOO NUMEROUS TO COUNT  <3 (WBC/hpf)   RBC / HPF TOO NUMEROUS TO COUNT  <3 (RBC/hpf)   Bacteria,  UA MANY (*) RARE    US Ob Comp Less 14 Wks  06/28/2011  *RADIOLOGY REPORT*  Clinical Data: Heavy vaginal bleeding.  OBSTETRIC <14 WK ULTRASOUND  Technique:  Transabdominal ultrasound was performed for evaluation of the gestation as well as the maternal uterus and adnexal regions.  Comparison:  Pelvic ultrasound performed 05/21/2011  Intrauterine gestational sac: None seen. Yolk sac: N/A Embryo: N/A Cardiac Activity: N/A  Maternal uterus/Adnexae: There is a large amount of high echogenicity material within the uterus, concerning for clot.  The endometrial echo complex measures 4.2 cm in thickness.  Focally increased blood flow is noted anteriorly on the right side of the uterus, raising concern for underlying retained products of conception.  The ovaries are unremarkable in appearance.  The right ovary measures 3.4 x 2.2 x 2.7 cm, while the left ovary measures 3.2 x 2.0 x 2.0 cm.  No suspicious adnexal masses are seen.  There is no definite evidence for ovarian torsion.  No free fluid is seen within the pelvic cul-de-sac.  IMPRESSION: No intrauterine gestational sac seen.  Large amount of clot noted within the uterus, with thickening of the endometrial echo complex to 4.2 cm.  Focally increased blood flow noted anteriorly on the right side of the uterus, raising concern for underlying retained products of conception.  Findings were discussed with Georges Mouse NP at 05:44 a.m. on 06/28/2011.  Original Report Authenticated By: Tonia Ghent, M.D.     Assessment and Plan  25 y.o. G2P1001 at [redacted]w[redacted]d SAB - possible retained POC on u/s, Cytotec administered in MAU O neg - rhogam administered UTI - rx Keflex  F/U in GYN clinic in 2 weeks  Lindzee Gouge 06/28/2011, 5:03 AM

## 2011-06-28 NOTE — Progress Notes (Signed)
Pt G2 P1 at 11.3wks woke up with vaginal bleeding, having cramping and lower back pain.

## 2011-06-28 NOTE — Progress Notes (Signed)
Pt arrived via EMS

## 2011-06-28 NOTE — ED Notes (Signed)
N. Frazier CNM in to see pt. 

## 2011-06-28 NOTE — ED Provider Notes (Signed)
Chart reviewed and agree with management and plan.  

## 2011-06-29 LAB — RH IG WORKUP (INCLUDES ABO/RH)
Antibody Screen: NEGATIVE
Unit division: 0

## 2011-06-30 LAB — URINE CULTURE: Culture  Setup Time: 201212071439

## 2011-07-03 ENCOUNTER — Ambulatory Visit: Payer: Medicaid Other | Admitting: Advanced Practice Midwife

## 2011-07-18 ENCOUNTER — Inpatient Hospital Stay (HOSPITAL_COMMUNITY)
Admission: EM | Admit: 2011-07-18 | Discharge: 2011-07-24 | DRG: 690 | Disposition: A | Discharge status: Home / Self Care | Payer: Medicaid Other | Attending: Family Medicine | Admitting: Family Medicine

## 2011-07-18 ENCOUNTER — Inpatient Hospital Stay (HOSPITAL_COMMUNITY): Payer: Medicaid Other

## 2011-07-18 ENCOUNTER — Encounter (HOSPITAL_COMMUNITY): Payer: Self-pay | Admitting: *Deleted

## 2011-07-18 ENCOUNTER — Emergency Department (HOSPITAL_COMMUNITY): Payer: Medicaid Other

## 2011-07-18 DIAGNOSIS — N172 Acute kidney failure with medullary necrosis: Secondary | ICD-10-CM

## 2011-07-18 DIAGNOSIS — R1110 Vomiting, unspecified: Secondary | ICD-10-CM

## 2011-07-18 DIAGNOSIS — R161 Splenomegaly, not elsewhere classified: Secondary | ICD-10-CM

## 2011-07-18 DIAGNOSIS — K219 Gastro-esophageal reflux disease without esophagitis: Secondary | ICD-10-CM

## 2011-07-18 DIAGNOSIS — B9789 Other viral agents as the cause of diseases classified elsewhere: Secondary | ICD-10-CM | POA: Diagnosis present

## 2011-07-18 DIAGNOSIS — E871 Hypo-osmolality and hyponatremia: Secondary | ICD-10-CM | POA: Diagnosis present

## 2011-07-18 DIAGNOSIS — N12 Tubulo-interstitial nephritis, not specified as acute or chronic: Principal | ICD-10-CM

## 2011-07-18 DIAGNOSIS — R112 Nausea with vomiting, unspecified: Secondary | ICD-10-CM | POA: Diagnosis present

## 2011-07-18 DIAGNOSIS — F3130 Bipolar disorder, current episode depressed, mild or moderate severity, unspecified: Secondary | ICD-10-CM | POA: Diagnosis present

## 2011-07-18 DIAGNOSIS — N10 Acute tubulo-interstitial nephritis: Secondary | ICD-10-CM

## 2011-07-18 DIAGNOSIS — I959 Hypotension, unspecified: Secondary | ICD-10-CM

## 2011-07-18 DIAGNOSIS — A419 Sepsis, unspecified organism: Secondary | ICD-10-CM

## 2011-07-18 DIAGNOSIS — N136 Pyonephrosis: Secondary | ICD-10-CM

## 2011-07-18 DIAGNOSIS — J069 Acute upper respiratory infection, unspecified: Secondary | ICD-10-CM | POA: Diagnosis present

## 2011-07-18 LAB — CBC
HCT: 31.4 % — ABNORMAL LOW (ref 36.0–46.0)
MCH: 24.7 pg — ABNORMAL LOW (ref 26.0–34.0)
MCHC: 32 g/dL (ref 30.0–36.0)
MCV: 77.3 fL — ABNORMAL LOW (ref 78.0–100.0)
Platelets: 177 10*3/uL (ref 150–400)
Platelets: 164 10*3/uL (ref 150–400)
RBC: 3.96 MIL/uL (ref 3.87–5.11)
RDW: 17.1 % — ABNORMAL HIGH (ref 11.5–15.5)
RDW: 17.3 % — ABNORMAL HIGH (ref 11.5–15.5)
WBC: 15.5 10*3/uL — ABNORMAL HIGH (ref 4.0–10.5)

## 2011-07-18 LAB — BASIC METABOLIC PANEL
Chloride: 96 mEq/L (ref 96–112)
GFR calc Af Amer: 88 mL/min — ABNORMAL LOW (ref >90)
Potassium: 3.6 mEq/L (ref 3.5–5.1)
Sodium: 132 mEq/L — ABNORMAL LOW (ref 135–145)

## 2011-07-18 LAB — HEPATIC FUNCTION PANEL
ALT: 17 U/L (ref 0–35)
AST: 27 U/L (ref 0–37)
Albumin: 2.6 g/dL — ABNORMAL LOW (ref 3.5–5.2)
Alkaline Phosphatase: 72 U/L (ref 39–117)
Bilirubin, Direct: 0.3 mg/dL (ref 0.0–0.3)
Total Bilirubin: 0.7 mg/dL (ref 0.3–1.2)

## 2011-07-18 LAB — URINALYSIS, ROUTINE W REFLEX MICROSCOPIC
Glucose, UA: NEGATIVE mg/dL
Nitrite: NEGATIVE
Protein, ur: NEGATIVE mg/dL

## 2011-07-18 LAB — INFLUENZA PANEL BY PCR (TYPE A & B): Influenza A By PCR: NEGATIVE

## 2011-07-18 LAB — POCT PREGNANCY, URINE: Preg Test, Ur: NEGATIVE

## 2011-07-18 LAB — URINE MICROSCOPIC-ADD ON

## 2011-07-18 LAB — CREATININE, SERUM: Creatinine, Ser: 1.03 mg/dL (ref 0.50–1.10)

## 2011-07-18 LAB — WET PREP, GENITAL: WBC, Wet Prep HPF POC: NONE SEEN

## 2011-07-18 MED ORDER — ACETAMINOPHEN 325 MG PO TABS
650.0000 mg | ORAL_TABLET | ORAL | Status: DC | PRN
  Administered 2011-07-18: 650 mg via ORAL
  Filled 2011-07-18: qty 2

## 2011-07-18 MED ORDER — SODIUM CHLORIDE 0.9 % IV BOLUS (SEPSIS)
2000.0000 mL | Freq: Once | INTRAVENOUS | Status: AC
  Administered 2011-07-18: 1000 mL via INTRAVENOUS

## 2011-07-18 MED ORDER — MORPHINE SULFATE 4 MG/ML IJ SOLN
4.0000 mg | Freq: Once | INTRAMUSCULAR | Status: AC
  Administered 2011-07-18: 4 mg via INTRAVENOUS
  Filled 2011-07-18: qty 1

## 2011-07-18 MED ORDER — ACETAMINOPHEN 325 MG PO TABS
ORAL_TABLET | ORAL | Status: AC
  Filled 2011-07-18: qty 2

## 2011-07-18 MED ORDER — ACETAMINOPHEN 325 MG PO TABS
ORAL_TABLET | ORAL | Status: AC
  Filled 2011-07-18: qty 1

## 2011-07-18 MED ORDER — ONDANSETRON HCL 4 MG/2ML IJ SOLN
4.0000 mg | Freq: Once | INTRAMUSCULAR | Status: AC
  Administered 2011-07-18: 4 mg via INTRAVENOUS
  Filled 2011-07-18: qty 2

## 2011-07-18 MED ORDER — SODIUM CHLORIDE 0.9 % IV SOLN
INTRAVENOUS | Status: DC
  Administered 2011-07-18 – 2011-07-21 (×4): via INTRAVENOUS

## 2011-07-18 MED ORDER — SODIUM CHLORIDE 0.9 % IV BOLUS (SEPSIS): 1000.0000 mL | Freq: Once | INTRAVENOUS | Status: DC

## 2011-07-18 MED ORDER — MORPHINE SULFATE 2 MG/ML IJ SOLN
1.0000 mg | INTRAMUSCULAR | Status: DC | PRN
  Administered 2011-07-18 – 2011-07-19 (×3): 1 mg via INTRAVENOUS
  Filled 2011-07-18 (×4): qty 1

## 2011-07-18 MED ORDER — ONDANSETRON HCL 4 MG/2ML IJ SOLN
4.0000 mg | Freq: Four times a day (QID) | INTRAMUSCULAR | Status: DC | PRN
  Administered 2011-07-18 – 2011-07-19 (×4): 4 mg via INTRAVENOUS
  Filled 2011-07-18 (×4): qty 2

## 2011-07-18 MED ORDER — IOHEXOL 300 MG/ML  SOLN: 20.0000 mL | INTRAMUSCULAR | Status: DC

## 2011-07-18 MED ORDER — IOHEXOL 300 MG/ML  SOLN
100.0000 mL | Freq: Once | INTRAMUSCULAR | Status: AC | PRN
  Administered 2011-07-18: 100 mL via INTRAVENOUS

## 2011-07-18 MED ORDER — ONDANSETRON HCL 4 MG PO TABS: 4.0000 mg | ORAL_TABLET | Freq: Four times a day (QID) | ORAL | Status: DC | PRN

## 2011-07-18 MED ORDER — DEXTROSE 5 % IV SOLN
1.0000 g | INTRAVENOUS | Status: DC
  Administered 2011-07-18 – 2011-07-21 (×4): 1 g via INTRAVENOUS
  Filled 2011-07-18 (×4): qty 10

## 2011-07-18 MED ORDER — CIPROFLOXACIN IN D5W 400 MG/200ML IV SOLN
400.0000 mg | Freq: Two times a day (BID) | INTRAVENOUS | Status: DC
  Administered 2011-07-18 – 2011-07-21 (×6): 400 mg via INTRAVENOUS
  Filled 2011-07-18 (×7): qty 200

## 2011-07-18 MED ORDER — ACETAMINOPHEN 325 MG PO TABS
650.0000 mg | ORAL_TABLET | Freq: Once | ORAL | Status: AC
  Administered 2011-07-18: 650 mg via ORAL
  Filled 2011-07-18: qty 1

## 2011-07-18 MED ORDER — ENOXAPARIN SODIUM 40 MG/0.4ML ~~LOC~~ SOLN
40.0000 mg | SUBCUTANEOUS | Status: DC
  Administered 2011-07-18 – 2011-07-23 (×6): 40 mg via SUBCUTANEOUS
  Filled 2011-07-18 (×7): qty 0.4

## 2011-07-18 MED ORDER — KETOROLAC TROMETHAMINE 30 MG/ML IJ SOLN
30.0000 mg | Freq: Once | INTRAMUSCULAR | Status: AC
  Administered 2011-07-18: 30 mg via INTRAVENOUS
  Filled 2011-07-18: qty 1

## 2011-07-18 NOTE — ED Notes (Signed)
Patient resting comfortably nausea improved.

## 2011-07-18 NOTE — ED Notes (Signed)
Pt states the contrast liquid is not sitting well on her stomach because it taste bad.

## 2011-07-18 NOTE — ED Notes (Signed)
[id number] READY

## 2011-07-18 NOTE — Consult Note (Signed)
Name: [name] [name] MRN: [id number] DOB: [birth year]    LOS: 0  PCCM CONSULT NOTE  History of Present Illness: 66 F who had a spontaneous abortion 2 wks prior to admission to the hospital.  She presents to the hospital with fever and feeling of abdominal fullness with N/V and cough x4 days.  Patient was seen in the ED and PCCM called to evaluate patient for admission.    Lines / Drains: PIV  Cultures: Blood 12/27>>> Urine 12/27>>> Sputum 12/27>>>  Antibiotics: Rocephin 12/27>>>  Tests / Events: Abd/Pelvic CT 12/27>>>Findings compatible with right pyelonephritis. No discrete abscess identified.  Splenomegaly.  Fatty infiltration of the liver.  Past Medical History  Diagnosis Date  . Abnormal Pap smear   . Anxiety   . Depression   . Bipolar 1 disorder    Past Surgical History  Procedure Date  . Tooth extraction    Prior to Admission medications   Medication Sig Start Date End Date Taking? Authorizing Provider  ibuprofen (ADVIL,MOTRIN) 800 MG tablet Take 800 mg by mouth every 8 (eight) hours as needed. For pain    Yes Historical Provider, MD   Allergies Allergies  Allergen Reactions  . Shellfish Allergy Anaphylaxis and Rash   Family History History reviewed. No pertinent family history.  Social History  reports that she quit smoking about 4 years ago. She has never used smokeless tobacco. She reports that she does not drink alcohol or use illicit drugs.  Review Of Systems  11 points review of systems is negative with an exception of listed in HPI.  Vital Signs: Filed Vitals:   07/18/11 1622  BP: 108/77  Pulse: 114  Temp: 100.3 F (37.9 C)  Resp: 22   No intake or output data in the 24 hours ending 07/18/11 1637  Physical Examination: General:  Well appearing, NAD. Neuro:  Alert and oriented, moving all extremities.   HEENT:  [place]/AT, PERRL, EOM-I and dry mucous membrane. Neck:  Supple, -LAN and -thyromegally.   Cardiovascular:  RRR, Nl S1/S2,  -M/R/G. Lungs:  CTA bilaterally. Abdomen:  Soft, NT, ND and +BS. Musculoskeletal:  -edema and -tenderness.  Labs and Imaging:   CBC    Component Value Date/Time   WBC 15.5* 07/18/2011 1014   RBC 4.09 07/18/2011 1014   HGB 10.3* 07/18/2011 1014   HCT 31.4* 07/18/2011 1014   PLT 177 07/18/2011 1014   MCV 76.8* 07/18/2011 1014   MCH 25.2* 07/18/2011 1014   MCHC 32.8 07/18/2011 1014   RDW 17.1* 07/18/2011 1014   LYMPHSABS 1.9 10/03/2010 1718   MONOABS 0.7 10/03/2010 1718   EOSABS 0.1 10/03/2010 1718   BASOSABS 0.0 10/03/2010 1718   BMET    Component Value Date/Time   NA 132* 07/18/2011 1014   K 3.6 07/18/2011 1014   CL 96 07/18/2011 1014   CO2 22 07/18/2011 1014   GLUCOSE 113* 07/18/2011 1014   BUN 8 07/18/2011 1014   CREATININE 1.03 07/18/2011 1014   CALCIUM 8.2* 07/18/2011 1014   GFRNONAA 76* 07/18/2011 1014   GFRAA 88* 07/18/2011 1014   ABG No results found for this basename: phart, pco2, pco2art, po2, po2art, hco3, tco2, acidbasedef, o2sat    No results found for this basename: MG in the last 168 hours Lab Results  Component Value Date   CALCIUM 8.2* 07/18/2011    Assessment and Plan: 27 F, recent abortion 2 wks ago who presents to the hospital with N/V.  She was noted to be hypotensive which is likely  hypovolemia from N/V.  Improved with IVF.  The patient is not a candidate for ICU admission at this point, would admit to SDU.  More importantly however, would strongly recommend that the patient have uterus addressed by OB to make sure that are no retained products of conception.  The other issue would be to address the splenomegaly.   No further need for PCCM, S/O, please call back if needed.  Koren Bound, M.D. Pulmonary and Critical Care Medicine [facility] [phone]  07/18/2011, 4:37 PM

## 2011-07-18 NOTE — ED Notes (Signed)
Admit Doctor at bedside.  

## 2011-07-18 NOTE — ED Notes (Signed)
Attempted to call report to [phone].  French Ana will be RN on [phone] but she was in contact room.  Will return call to ED when out of contact room

## 2011-07-18 NOTE — ED Notes (Signed)
Called report to [employer]

## 2011-07-18 NOTE — ED Notes (Signed)
MD notified of vitals.  

## 2011-07-18 NOTE — ED Provider Notes (Signed)
Pt seen by PCCM in ED, blood pressure has improved.  They have cleared her for admission to the floor.  D/w Family Practice resident who will see patient in the ED for admission.    Ethelda Chick, MD 07/18/11 [phone]

## 2011-07-18 NOTE — ED Notes (Signed)
Pt states throwing up for 3 days.  Unable to keep anything down.  Had miscarriage 2 weeks ago and has been weak since. Pain in abdomin, ribs and back.  Denies fever and diarrhea

## 2011-07-18 NOTE — ED Notes (Signed)
Pt has finished 1/2 contrast liquid

## 2011-07-18 NOTE — H&P (Signed)
[name] [name] is an 25 y.o. female.   Chief Complaint: Generalized abdominal pain, nausea and vomiting HPI:  25 year-old female with a past medical history is significant for 12 week spontaneous abortion, complicated by retained products of conception 3 weeks ago presents with a five-day history of generalized abdominal pain accompanied with nausea vomiting. Patient says her nausea and vomiting started on Sunday afternoon. She reports feeling nauseous and vomiting after eating and drinking. She reports 3-4 episodes daily of nonbloody occasionally bilious emesis. She presented to the ED due to persistent nausea and worsening generalized abdominal pain. She denies trauma to the abdomen, fever, sick contacts, dysuria, urinary frequency, and urinary urgency.   Regarding her spontaneous abortion on December 7. She was treated in [facility] 2 weeks ago. Vaginal ultrasound revealed questionable retained products of conception she was given a dose of oral Cytotec with instructions to followup in the GYN clinic in 2 weeks. She is also diagnosed with UTI at this time and treated with a seven-day course of Keflex. She made an appointment at the earliest available was January 9. She states that she had abdominal cramping and vaginal bleeding stopped after 5 days and she began to to feel better until the nausea vomiting started on Sunday, December 23rd.   In the ED the patient was found to be febrile with temperature 103 F, hypotensive with blood pressure 90/40 and tachycardic the heart rate into the 140s. Initially her blood pressure did not improve with IV fluid resuscitation so CCM was consulted for possible admission for septic shock. The patient was deemed stable for stepped-down. Her blood pressure improved to 108/77.   Past Medical History  Diagnosis Date  . Abnormal Pap smear   . Anxiety   . Depression   . Bipolar 1 disorder     Past Surgical History  Procedure Date  . Tooth extraction      History reviewed. No pertinent family history. Social History:  reports that she quit smoking about 4 years ago. She has never used smokeless tobacco. She reports that she does not drink alcohol or use illicit drugs.  Allergies:  Allergies  Allergen Reactions  . Shellfish Allergy Anaphylaxis and Rash    Pertinent labs: WBC 15.5  Lactic acid 2.2 Na 132 Cr 1.03 UA: mod Hgb, neg protein and nitrite, mod leukocytes,  11-20 WBC and  bacteria on micro Wet prep: negative   U preg negative   Urine culture from 12/7: Greater than 100,000 Escherichia coli sensitive to everything except for Bactrim.  Studies:  Ct Abdomen Pelvis W Contrast:  Mild fatty infiltration of the liver is identified with more focal fatty changes involving the posterior medial and lateral left hepatic segments. Moderate to marked splenomegaly is identified with a splenic volume of 1200 ml.  No focal splenic lesions are identified.  Ill-defined areas of decreased attenuation within the right kidney are noted with adjacent perinephric inflammation, compatible with pyelonephritis.  There is no evidence of discrete renal abscess. The left kidney, adrenal glands and pancreas are unremarkable.  No free fluid, enlarged lymph nodes, biliary dilation or abdominal aortic aneurysm identified. A 2.8 x 3.1 cm left ovarian cyst / follicle is noted. The uterus and right ovary are within normal limits. The bladder is unremarkable.  No acute or suspicious bony abnormalities are identified.  IMPRESSION: Findings compatible with right pyelonephritis.  No discrete abscess identified.  Splenomegaly.  Fatty infiltration of the liver.   ROS Pertinent review of systems  as per history of present illness. In addition the patient endorses sore throat. She denies cough.  Blood pressure 88/50, pulse 112, temperature 100.8 F (38.2 C), temperature source Oral, resp. rate 13, last menstrual period 03/06/2011, SpO2 100.00%, unknown if currently  breastfeeding. Physical Exam  General appearance: Obese female. Asleep but easily arousable. Patient is alert, cooperative and no distress Eyes: conjunctivae/corneas clear. PERRL, EOM's intact. Throat: lips, mucosa, and tongue normal; teeth and gums normal Neck: no adenopathy, no carotid bruit, no JVD, supple, symmetrical, trachea midline and thyroid not enlarged, symmetric, no tenderness/mass/nodules Back: symmetric, no curvature. No paraspinal tenderness. Patient with tenderness over right and left lumbar area. Patient with marked right CVA tenderness.  Lungs: clear to auscultation bilaterally Heart: regular rate and rhythm, S1, S2 normal, no murmur, click, rub or gallop Abdomen: Obese. Decreased bowel sounds. Tenderness to palpation diffusely. Tenderness in left lower abdomen and epigastric area greater than other areas of the abdomen. This without rebound or guarding. Pulses: 2+ and symmetric Skin: Skin color, texture, turgor normal. No rashes or lesions Neurologic: Grossly normal  Genitourinary: Performed by ED Provider Normal external genitalia without any discharge. Vaginal mucosa pink and moist with small amount of white discharge. Cervical os is closed without any discharge. There is no cervical motion tenderness. There is no adnexal mass or tenderness. Negative CVAT  Assessment/Plan 25 year old female presents with nausea vomiting and abdominal pain with CT findings consistent with right-sided pyelonephritis.  1. Pyelonephritis: A: Pyelnephritis is the most likely given the patient's clinical presentation and CT findings. I've also  considered endometritis in the setting of retained products of conception. However this is less likely since the patient reports that she has been without vaginal bleeding for 2 weeks now. She was improving, and her abdominal pain is not localized over uterus. Per her report patient has  completed a seven-day course of Keflex for her UTI diagnosed nearly 3  weeks ago. She does not appear septic or be criteria for septic shock. P:  -IV antibiotics will continue Rocephin and add on Cipro. -Obtain pelvic ultrasound to rule out further retained products of conception. -Followup urine Gram stain and culture. -f/u CG/Chlam  -Followup repeat CBC in a.m. -Followup influenza PCR. -Continue IV fluid resuscitation will give patient another liter bolus then NS at 100 cc per hour. -Tylenol when necessary for fever. -Morphine when necessary for pain.  2. Hyponatremia: Hypovolemic hyponatremia secondary to vomiting and poor by mouth intake. Plan to continue IV fluid resuscitation and recheck been in the morning.   3. FEN GI: Will add on liver function panel to bmet given evidence of fatty liver on CT abdomen. Patient states that she would like to try to eat will order clear liquid diet to be as tolerated. Zofran as needed for nausea.  3. DVT prophylaxis: Lovenox.  4. Disposition: Pending clinical improvement in response to IV fluid resuscitation plan to transfer patient to regular floor bed in the morning. Anticipate that she would be stable for discharge in one to two days.  Merelin Human 07/18/2011, 7:53 PM

## 2011-07-18 NOTE — ED Notes (Signed)
Pt placed in gown, on monitor with continuous blood pressure and pulse oximetry

## 2011-07-18 NOTE — ED Notes (Signed)
Attempted to call report to [name] [name] in [phone].  She will return call as she is getting report on the floor.

## 2011-07-18 NOTE — ED Notes (Addendum)
Patient states she started with N/V and coughing  x 4 days ago. Patient denies fever. Patient states she thinks she is pregnant because this ih how she felt when she was pregnant before.

## 2011-07-18 NOTE — ED Notes (Signed)
Ct came to get pt.  Pt had not finished contrast fluid.  Ct will return when pt finished. Pt advised

## 2011-07-19 ENCOUNTER — Inpatient Hospital Stay (HOSPITAL_COMMUNITY): Payer: Medicaid Other

## 2011-07-19 LAB — BASIC METABOLIC PANEL
CO2: 23 mEq/L (ref 19–32)
Chloride: 100 mEq/L (ref 96–112)
Creatinine, Ser: 1.09 mg/dL (ref 0.50–1.10)
Glucose, Bld: 115 mg/dL — ABNORMAL HIGH (ref 70–99)

## 2011-07-19 LAB — PROTIME-INR
INR: 1.34 (ref 0.00–1.49)
Prothrombin Time: 16.8 seconds — ABNORMAL HIGH (ref 11.6–15.2)

## 2011-07-19 LAB — CBC
Hemoglobin: 8.7 g/dL — ABNORMAL LOW (ref 12.0–15.0)
MCV: 77 fL — ABNORMAL LOW (ref 78.0–100.0)
Platelets: 158 10*3/uL (ref 150–400)
RBC: 3.52 MIL/uL — ABNORMAL LOW (ref 3.87–5.11)
WBC: 10.1 10*3/uL (ref 4.0–10.5)

## 2011-07-19 LAB — LIPASE, BLOOD: Lipase: 11 U/L (ref 11–59)

## 2011-07-19 LAB — MRSA PCR SCREENING: MRSA by PCR: NEGATIVE

## 2011-07-19 LAB — MONONUCLEOSIS SCREEN: Mono Screen: NEGATIVE

## 2011-07-19 MED ORDER — ONDANSETRON HCL 4 MG PO TABS
4.0000 mg | ORAL_TABLET | Freq: Four times a day (QID) | ORAL | Status: DC | PRN
  Administered 2011-07-20 – 2011-07-24 (×4): 4 mg via ORAL
  Filled 2011-07-19 (×5): qty 1

## 2011-07-19 MED ORDER — PROMETHAZINE HCL 25 MG/ML IJ SOLN
25.0000 mg | INTRAMUSCULAR | Status: DC | PRN
  Administered 2011-07-19 – 2011-07-22 (×6): 25 mg via INTRAVENOUS
  Filled 2011-07-19 (×9): qty 1

## 2011-07-19 MED ORDER — ACETAMINOPHEN 325 MG PO TABS
650.0000 mg | ORAL_TABLET | ORAL | Status: DC | PRN
  Administered 2011-07-20 – 2011-07-22 (×6): 650 mg via ORAL
  Filled 2011-07-19 (×6): qty 2

## 2011-07-19 MED ORDER — ACETAMINOPHEN 650 MG RE SUPP: 650.0000 mg | RECTAL | Status: DC | PRN

## 2011-07-19 MED ORDER — PANTOPRAZOLE SODIUM 40 MG PO TBEC
40.0000 mg | DELAYED_RELEASE_TABLET | Freq: Every day | ORAL | Status: DC
  Administered 2011-07-19 – 2011-07-24 (×6): 40 mg via ORAL
  Filled 2011-07-19 (×6): qty 1

## 2011-07-19 MED ORDER — MORPHINE SULFATE 2 MG/ML IJ SOLN
2.0000 mg | INTRAMUSCULAR | Status: DC | PRN
  Administered 2011-07-19 – 2011-07-22 (×15): 2 mg via INTRAVENOUS
  Filled 2011-07-19 (×17): qty 1

## 2011-07-19 MED ORDER — ACETAMINOPHEN 650 MG RE SUPP
650.0000 mg | RECTAL | Status: DC | PRN
  Administered 2011-07-19: 650 mg via RECTAL
  Filled 2011-07-19: qty 1

## 2011-07-19 MED ORDER — ONDANSETRON 8 MG/NS 50 ML IVPB
8.0000 mg | Freq: Four times a day (QID) | INTRAVENOUS | Status: DC | PRN
  Filled 2011-07-19 (×3): qty 8

## 2011-07-19 MED ORDER — BENZONATATE 100 MG PO CAPS
100.0000 mg | ORAL_CAPSULE | Freq: Three times a day (TID) | ORAL | Status: DC | PRN
  Administered 2011-07-19 – 2011-07-23 (×8): 100 mg via ORAL
  Filled 2011-07-19 (×7): qty 1

## 2011-07-19 MED ORDER — PANTOPRAZOLE SODIUM 40 MG PO TBEC: 40.0000 mg | DELAYED_RELEASE_TABLET | Freq: Every day | ORAL | Status: DC

## 2011-07-19 NOTE — H&P (Signed)
Seen and examined.  Chart reviewed and discussed with Dr. Armen Pickup.  Agree with her management.  Briefly, previously healthy 25 yo female with signs, symptoms, labs and X-rays all pointing to Rt pyelonephritis.  She was dehydrated and hypotensive on admit.  VS have nicely improved with hydration.  She does not yet feel better.  Two issues complicate this otherwise straightforward case. 1. She had recent treatment of UTI with Keflex, so we may be dealing with a resistant organism. 2. She had a recent abortion.  As such we need to be aware of the possibility of her having more than one problem causing her abd pain.    Agree with treatment.  Likely transition to the floor either this afternoon or tomorrow morning.

## 2011-07-19 NOTE — Progress Notes (Signed)
I saw Ms. Graves earlier today - please see my note.  Agree with documentation and management by Dr. Armen Pickup.

## 2011-07-19 NOTE — Progress Notes (Signed)
Subjective: Interval History: has no complaint of persistent abdominal pain. She describes anterior abdominal pressure and sharp back pain. Also has persistent nausea and emesis after drinking clears.   Objective: Vital signs in last 24 hours: Temp:  [98.9 F (37.2 C)-102.9 F (39.4 C)] 100.8 F (38.2 C) (12/28 0400) Pulse Rate:  [107-121] 121  (12/27 2015) Resp:  [13-25] 13  (12/27 1927) BP: (88-126)/(49-93) 112/56 mmHg (12/28 0400) SpO2:  [96 %-100 %] 96 % (12/27 2015) Weight:  [263 lb 14.3 oz (119.7 kg)] 263 lb 14.3 oz (119.7 kg) (12/27 2015)  Intake/Output from previous day: 12/27 0701 - 12/28 0700 In: 954 [I.V.:700] Out: -     Physical exam: General appearance: alert, cooperative, mild distress and morbidly obese Lungs: clear to auscultation bilaterally Heart: regular rate and rhythm, S1, S2 normal, no murmur, click, rub or gallop Abdomen: obese, normoactive BS, tender to palpation RUQ, no rebound or guarding.  Extremities: extremities normal, atraumatic, no cyanosis or edema Pulses: 2+ and symmetric Skin: Skin color, texture, turgor normal. Skin warm and dry.  Neurologic: Grossly normal  Labs:  WBC: 15.5 ---> 11.5 --> 10.1 Na 132 --> 134 Cr 1.03 --> 1.09 Lipase 11 UGm stain and U Cx-pending   Studies/Results: US Transvaginal Non-ob 07/18/2011: 1.  No evidence for retained products of conception. 2.  Bilateral ovarian cysts, measuring 4.2 cm on the left and 3.1 cm on the right.  The left-sided ovarian cyst is mildly complex. Given the rapidity of onset and the lack of associated symptoms, these are likely physiologic. 3.  Prominent Nabothian cysts noted at the cervix. 4.  No evidence for ovarian torsion.  Scheduled Meds:  All medications reviewed   Assessment/Plan: 25 yo F presents  with abdominal pain, nausea and vomiting.  1. Pyelonephritis A: symptoms and physical exam findings consistent with pyelonephritis. However, splenomegaly and  anterior abdominal pain is  concerning for additional pathology.  Pelvic U/S relatively normal. Patient with normal lipase, non-drinker, no history of gallstones. Overall, patient remains febrile. WBC and BP improved.  P:  -Continue IV CTX and Cipro -f/u urine Gm stain and culture. -Continue prn: tylenol, anti-emetics and morphine.   2. Cough/Sore throat A: mild cough that has worsened overnight. Patient also complained of sore throat on initial exam. Oropharynx was clear. ? Reflux vs viral etiology. Concerned for Mononucleosis.  P:  -Monospot -Continue protonix and tessalon pearles.    3. FEN/GI: Na improved. Continue NS at 100 cc/hr. continue clear liquid diet. Advance as tolerated. Wean IVF as PO intake improves.   4. DVT PPx: continue Lovenox.   5. Dispo: pending clinical improvement. I will keep patient in the stepdown unit as she is requiring close monitoring and care.    LOS: 1 day   [facility]

## 2011-07-19 NOTE — Progress Notes (Signed)
07-19-11 UR completed. Carlie Corpus RN BSN  

## 2011-07-20 LAB — GC/CHLAMYDIA PROBE AMP, GENITAL
Chlamydia, DNA Probe: NEGATIVE
GC Probe Amp, Genital: NEGATIVE

## 2011-07-20 MED ORDER — SODIUM CHLORIDE 0.9 % IJ SOLN
INTRAMUSCULAR | Status: AC
  Administered 2011-07-20: 10 mL
  Filled 2011-07-20: qty 10

## 2011-07-20 NOTE — Progress Notes (Signed)
Subjective: Nausea has subsided. Having dry persistant cough. Continues to have abdominal pain, but this is improved. Cannot tolerate solids yet.  Objective: Vital signs in last 24 hours: Temp:  [98.8 F (37.1 C)-101.4 F (38.6 C)] 98.8 F (37.1 C) (12/29 0719) Pulse Rate:  [95-112] 95  (12/29 0719) Resp:  [14-35] 29  (12/29 0719) BP: (99-117)/(45-60) 109/58 mmHg (12/29 0719) SpO2:  [94 %-100 %] 95 % (12/29 0719)  Intake/Output from previous day: 12/28 0701 - 12/29 0700 In: 960 [P.O.:360; I.V.:400] Out: -     Physical exam: General appearance: alert, cooperative, mild distress and morbidly obese, coughing Lungs: clear to auscultation bilaterally Heart: regular rate and rhythm, S1, S2 normal, no murmur, click, rub or gallop Abdomen: obese, normoactive BS, tender to palpation diffusly, no rebound or guarding.  Extremities: extremities normal, atraumatic, no cyanosis or edema Pulses: 2+ and symmetric Skin: Skin color, texture, turgor normal. Skin warm and dry. No rash Neurologic: Grossly normal  Labs:  Lab Results  Component Value Date   WBC 10.1 07/19/2011   HGB 8.7* 07/19/2011   HCT 27.1* 07/19/2011   MCV 77.0* 07/19/2011   PLT 158 07/19/2011    Studies/Results: US Transvaginal Non-ob 07/18/2011: 1.  No evidence for retained products of conception. 2.  Bilateral ovarian cysts, measuring 4.2 cm on the left and 3.1 cm on the right.  The left-sided ovarian cyst is mildly complex. Given the rapidity of onset and the lack of associated symptoms, these are likely physiologic. 3.  Prominent Nabothian cysts noted at the cervix. 4.  No evidence for ovarian torsion.  Scheduled Meds:  All medications reviewed   Assessment/Plan: 25 yo F presents  With rt. Pyelonephritis  1. Pyelonephritis A:  Still has abdominal pain and CVAT. Nausea resolved. Wbc decreasing. Still febrile overnight. Urine output not recorded, will add strict I's and O's P:  -Continue IV CTX and Cipro -f/u  urine Gm stain and culture. -Continue prn: tylenol, anti-emetics and morphine.   2. Cough A: continues to have dry cough. Chest xray normal 12/28. Mono negative. Will follow. P:  -Continue protonix and tessalon pearles.   3. FEN/GI: Na improved. Continue NS at 100 cc/hr. continue clear liquid diet. Advance as tolerated. Wean IVF as PO intake improves.   4. DVT PPx: continue Lovenox.   5. Dispo: pending clinical improvement. I will keep patient in the stepdown unit as she is requiring close monitoring and care.    LOS: 2 days   Edd Arbour

## 2011-07-20 NOTE — Progress Notes (Signed)
I interviewed and examined this patient and discussed the care plan with Dr. Rivka Safer and the [facility] team and agree with assessment and plan as documented in the progress note for today.    Caasi Giglia A. Sheffield Slider, MD Family Medicine Teaching Service Attending  07/20/2011 5:25 PM

## 2011-07-21 LAB — CBC
Hemoglobin: 8.6 g/dL — ABNORMAL LOW (ref 12.0–15.0)
MCHC: 31.7 g/dL (ref 30.0–36.0)
RBC: 3.5 MIL/uL — ABNORMAL LOW (ref 3.87–5.11)
WBC: 5.7 10*3/uL (ref 4.0–10.5)

## 2011-07-21 LAB — BASIC METABOLIC PANEL
CO2: 24 mEq/L (ref 19–32)
GFR calc non Af Amer: 85 mL/min — ABNORMAL LOW (ref >90)
Glucose, Bld: 99 mg/dL (ref 70–99)
Potassium: 3.4 mEq/L — ABNORMAL LOW (ref 3.5–5.1)
Sodium: 134 mEq/L — ABNORMAL LOW (ref 135–145)

## 2011-07-21 MED ORDER — POTASSIUM CHLORIDE CRYS ER 20 MEQ PO TBCR
40.0000 meq | EXTENDED_RELEASE_TABLET | Freq: Two times a day (BID) | ORAL | Status: AC
  Administered 2011-07-21 – 2011-07-22 (×2): 40 meq via ORAL
  Filled 2011-07-21 (×2): qty 2

## 2011-07-21 MED ORDER — CIPROFLOXACIN HCL 500 MG PO TABS
500.0000 mg | ORAL_TABLET | Freq: Two times a day (BID) | ORAL | Status: DC
  Administered 2011-07-22 – 2011-07-24 (×6): 500 mg via ORAL
  Filled 2011-07-21 (×9): qty 1

## 2011-07-21 NOTE — Progress Notes (Signed)
Subjective: Eating better. Cough subsided with tessalon perles. No N/V. Abdominal pain improving. No urinary complaints.  Objective: Vital signs in last 24 hours: Temp:  [98.5 F (36.9 C)-102.7 F (39.3 C)] 99.1 F (37.3 C) (12/30 0805) Pulse Rate:  [94-106] 94  (12/30 0805) Resp:  [19-37] 24  (12/30 0805) BP: (103-110)/(53-67) 109/67 mmHg (12/30 0805) SpO2:  [91 %-98 %] 91 % (12/30 0805)  Intake/Output from previous day: 12/29 0701 - 12/30 0700 In: 3030 [P.O.:480; I.V.:2100] Out: 2150 [Urine:2150]  Total I/O In: 100 [I.V.:100] Out: -  Physical exam: General appearance: alert, cooperative, mild distress and morbidly obese, coughing Lungs: clear to auscultation bilaterally Heart: regular rate and rhythm, S1, S2 normal, no murmur, click, rub or gallop Abdomen: obese, normoactive BS, tender to palpation diffusly, no rebound or guarding.  Extremities: extremities normal, atraumatic, no cyanosis or edema Pulses: 2+ and symmetric Skin: Skin color, texture, turgor normal. Skin warm and dry. No rash Neurologic: Grossly normal  Labs:  Lab Results  Component Value Date   WBC 5.7 07/21/2011   HGB 8.6* 07/21/2011   HCT 27.1* 07/21/2011   MCV 77.4* 07/21/2011   PLT 185 07/21/2011    Studies/Results: US Transvaginal Non-ob 07/18/2011: 1.  No evidence for retained products of conception. 2.  Bilateral ovarian cysts, measuring 4.2 cm on the left and 3.1 cm on the right.  The left-sided ovarian cyst is mildly complex. Given the rapidity of onset and the lack of associated symptoms, these are likely physiologic. 3.  Prominent Nabothian cysts noted at the cervix. 4.  No evidence for ovarian torsion.  Scheduled Meds:  All medications reviewed   Assessment/Plan: 25 yo F presents  With rt. Pyelonephritis  1. Pyelonephritis A:  Still has abdominal pain and CVAT. Nausea resolved. Wbc decreasing. Afebrile o/n. P:  - transfer to RNF - change to PO antibiotics. - cipro -Continue prn:  tylenol, anti-emetics and morphine.   2. Cough -Continue protonix and tessalon pearles.   3. FEN/GI: Na improved. Continue NS at 100 cc/hr. continue clear liquid diet. Advance as tolerated. Wean IVF as PO intake improves.   4. DVT PPx: continue Lovenox.   5. Dispo: pending clinical improvement. Transfer to [facility]. If afebrile for 24 hours, DC home.   LOS: 3 days   Tashyra Adduci MD 10:38 AM 07/21/2011

## 2011-07-21 NOTE — Progress Notes (Signed)
Pt transferred to [id number], report call to receiving nurse and all questions answered. Pt belongings transferred to new room.

## 2011-07-21 NOTE — Progress Notes (Signed)
I discussed the care plan with Dr. Rivka Safer and the [facility] team and agree with assessment and plan as documented in the progress note for today.    Faye Sanfilippo A. Sheffield Slider, MD Family Medicine Teaching Service Attending  07/21/2011 5:37 PM

## 2011-07-22 LAB — BASIC METABOLIC PANEL
CO2: 22 mEq/L (ref 19–32)
Calcium: 7.8 mg/dL — ABNORMAL LOW (ref 8.4–10.5)
Glucose, Bld: 98 mg/dL (ref 70–99)
Sodium: 136 mEq/L (ref 135–145)

## 2011-07-22 LAB — CBC
Hemoglobin: 8.3 g/dL — ABNORMAL LOW (ref 12.0–15.0)
MCH: 24.9 pg — ABNORMAL LOW (ref 26.0–34.0)
RBC: 3.33 MIL/uL — ABNORMAL LOW (ref 3.87–5.11)

## 2011-07-22 MED ORDER — CEFTRIAXONE SODIUM 1 G IJ SOLR
1.0000 g | Freq: Once | INTRAMUSCULAR | Status: AC
  Administered 2011-07-22: 1 g via INTRAMUSCULAR
  Filled 2011-07-22 (×2): qty 10

## 2011-07-22 MED ORDER — OXYCODONE-ACETAMINOPHEN 5-325 MG PO TABS
1.0000 | ORAL_TABLET | ORAL | Status: DC | PRN
  Administered 2011-07-23 – 2011-07-24 (×4): 1 via ORAL
  Filled 2011-07-22 (×4): qty 1

## 2011-07-22 MED ORDER — CEFTRIAXONE SODIUM 1 G IJ SOLR
1.0000 g | INTRAMUSCULAR | Status: DC
  Filled 2011-07-22 (×2): qty 10

## 2011-07-22 NOTE — Progress Notes (Signed)
Family Medicine Teaching Service Attending Note  I interviewed and examined patient [name] [name] and reviewed their tests and x-rays.  I discussed with Dr. Tye Savoy and reviewed their note for today.  I agree with their assessment and plan.     Additionally  Agree with ambulating and if able to do ADLs and feels comfortable ok to discharge

## 2011-07-22 NOTE — Progress Notes (Signed)
Subjective: Patient states she feels better today, but complains of low back pain.  She tolerated bagel this morning, but has not been eating as much as she normally does.  Denies any nausea or vomiting.  Tmax o/n was 100.6.  Patient wants to stay one more night; she is concerned she will feel worse if she goes home today and does not want to come back to the hospital.  Denies any urinary symptoms.  Objective: Vital signs in last 24 hours: Temp:  [98.4 F (36.9 C)-100.6 F (38.1 C)] 100.6 F (38.1 C) (12/31 0547) Pulse Rate:  [90-107] 104  (12/31 0547) Resp:  [18-27] 20  (12/31 0547) BP: (102-131)/(68-91) 113/77 mmHg (12/31 0547) SpO2:  [95 %-100 %] 100 % (12/31 0547) Weight change:  Last BM Date: 07/21/11  Intake/Output from previous day: 12/30 0701 - 12/31 0700 In: 340 [P.O.:240; I.V.:100] Out: 350 [Urine:350]   General appearance: alert, cooperative and mild distress Back: mild TTP lumbar paraspinal muscles Resp: clear to auscultation bilaterally Cardio: regular rate and rhythm, S1, S2 normal, no murmur, click, rub or gallop GI: soft, ND, NT, active BS Skin: Skin color, texture, turgor normal. No rashes or lesions  Lab Results:  [facility] 07/22/11 0655 07/21/11 0544  WBC 5.9 5.7  HGB 8.3* 8.6*  HCT 26.0* 27.1*  PLT 204 185   BMET  Basename 07/22/11 0655 07/21/11 0544  NA 136 134*  K 3.8 3.4*  CL 107 104  CO2 22 24  GLUCOSE 98 99  BUN <3* 5*  CREATININE 0.87 0.94  CALCIUM 7.8* 7.4*    Studies/Results: CT: right pyelo, no abscess; L ovarian cyst; fatty liver; mild splenomegaly  Medications: I have reviewed the patient's current medications.  Assessment/Plan: 1. Pyelonephritis: Persistent low back, CVA tenderness.  Nausea resolved.  No leukocytosis.  Tmax 100.6 - Continue PO Cipro for E.coli - Continue prn: tylenol, anti-emetics and morphine  2. Cough  - Continue protonix and tessalon perles   3. FEN/GI - Discontinue NS and see if this increases patient's  appetite - Push PO fluids - Advance to CHO mod diet as tolerated  4. DVT PPx: continue Lovenox.   5. Dispo: pending continued clinical improvement, if tolerating regular diet today, may consider late discharge today or in am   LOS: 4 days   DE LA CRUZ,Jamyson Jirak 07/22/2011, 10:05 AM

## 2011-07-22 NOTE — Progress Notes (Signed)
Oral temperature  101.1   MD notified.  Order for rocephin IV acknowledged.

## 2011-07-23 MED ORDER — MENTHOL 3 MG MT LOZG: 1.0000 | LOZENGE | OROMUCOSAL | Status: DC | PRN

## 2011-07-23 MED ORDER — ACETAMINOPHEN 325 MG PO TABS: 650.0000 mg | ORAL_TABLET | ORAL | Status: AC | PRN

## 2011-07-23 MED ORDER — CIPROFLOXACIN HCL 500 MG PO TABS: 500.0000 mg | ORAL_TABLET | Freq: Two times a day (BID) | ORAL | Status: AC

## 2011-07-23 MED ORDER — BENZONATATE 100 MG PO CAPS: 100.0000 mg | ORAL_CAPSULE | Freq: Three times a day (TID) | ORAL | Status: AC | PRN

## 2011-07-23 MED ORDER — MENTHOL 3 MG MT LOZG
1.0000 | LOZENGE | OROMUCOSAL | Status: DC | PRN
  Administered 2011-07-23: 3 mg via ORAL
  Filled 2011-07-23: qty 9

## 2011-07-23 MED ORDER — ONDANSETRON HCL 4 MG PO TABS: 4.0000 mg | ORAL_TABLET | Freq: Four times a day (QID) | ORAL | Status: AC | PRN

## 2011-07-23 MED ORDER — ACETAMINOPHEN 650 MG RE SUPP: 650.0000 mg | RECTAL | Status: DC | PRN

## 2011-07-23 NOTE — Progress Notes (Signed)
Patient refusing to ambulate in hall, stating, "They want me to walk the halls even though I get dizzy just walking to the bathroom.  I can't walk in the hall."

## 2011-07-23 NOTE — Discharge Summary (Signed)
I discussed with Dr Tye Savoy.  I agree with their plans documented in their  Note for today.

## 2011-07-23 NOTE — Discharge Summary (Signed)
Physician Discharge Summary  Patient ID: [name] [name] [id number] May 14, 1986 26 y.o.  Admit date: 07/18/2011 Discharge date: 07/23/2011  PCP: Ara Kussmaul, MD, MD   Discharge Diagnosis: 1. Right Pyelonephritis 2. Retained products of conception s/p spontaneous abortion, resolved. 3. Viral URI   Discharge Medications  Aftin, Lye  Home Medication Instructions [id number]   Printed on:07/23/11 1129  Medication Information                    acetaminophen (TYLENOL) 325 MG tablet Take 2 tablets (650 mg total) by mouth every 4 (four) hours as needed for fever.           acetaminophen (TYLENOL) 650 MG suppository Place 1 suppository (650 mg total) rectally every 4 (four) hours as needed for fever.           benzonatate (TESSALON) 100 MG capsule Take 1 capsule (100 mg total) by mouth 3 (three) times daily as needed for cough.           ciprofloxacin (CIPRO) 500 MG tablet Take 1 tablet (500 mg total) by mouth 2 (two) times daily. Take twice a day fr 4 more days.           menthol-cetylpyridinium (CEPACOL) 3 MG lozenge Take 1 lozenge (3 mg total) by mouth as needed.           ondansetron (ZOFRAN) 4 MG tablet Take 1 tablet (4 mg total) by mouth every 6 (six) hours as needed for nausea.             Labs: CBC  Lab 07/22/11 0655 07/21/11 0544 07/19/11 0655  WBC 5.9 5.7 10.1  HGB 8.3* 8.6* 8.7*  HCT 26.0* 27.1* 27.1*  PLT 204 185 158   BMET  Lab 07/22/11 0655 07/21/11 0544 07/19/11 0655 07/18/11 1915  NA 136 134* 134* --  K 3.8 3.4* 3.6 --  CL 107 104 100 --  CO2 22 24 23  --  BUN <3* 5* 9 --  CREATININE 0.87 0.94 1.09 --  CALCIUM 7.8* 7.4* 7.4* --  PROT -- -- -- 6.6  BILITOT -- -- -- 0.7  ALKPHOS -- -- -- 72  ALT -- -- -- 17  AST -- -- -- 27  GLUCOSE 98 99 115* --   Results for orders placed during the hospital encounter of 07/18/11 (from the past 72 hour(s))  CBC     Status: Abnormal   Collection Time   07/21/11  5:44 AM      Component Value Range  Comment   WBC 5.7  4.0 - 10.5 (K/uL)    RBC 3.50 (*) 3.87 - 5.11 (MIL/uL)    Hemoglobin 8.6 (*) 12.0 - 15.0 (g/dL)    HCT 29.5 (*) 28.4 - 46.0 (%)    MCV 77.4 (*) 78.0 - 100.0 (fL)    MCH 24.6 (*) 26.0 - 34.0 (pg)    MCHC 31.7  30.0 - 36.0 (g/dL)    RDW 13.2 (*) 44.0 - 15.5 (%)    Platelets 185  150 - 400 (K/uL)   BASIC METABOLIC PANEL     Status: Abnormal   Collection Time   07/21/11  5:44 AM      Component Value Range Comment   Sodium 134 (*) 135 - 145 (mEq/L)    Potassium 3.4 (*) 3.5 - 5.1 (mEq/L)    Chloride 104  96 - 112 (mEq/L)    CO2 24  19 - 32 (mEq/L)    Glucose, Bld 99  70 - 99 (mg/dL)    BUN 5 (*) 6 - 23 (mg/dL)    Creatinine, Ser 1.61  0.50 - 1.10 (mg/dL)    Calcium 7.4 (*) 8.4 - 10.5 (mg/dL)    GFR calc non Af Amer 85 (*) >90 (mL/min)    GFR calc Af Amer >90  >90 (mL/min)   CBC     Status: Abnormal   Collection Time   07/22/11  6:55 AM      Component Value Range Comment   WBC 5.9  4.0 - 10.5 (K/uL)    RBC 3.33 (*) 3.87 - 5.11 (MIL/uL)    Hemoglobin 8.3 (*) 12.0 - 15.0 (g/dL)    HCT 09.6 (*) 04.5 - 46.0 (%)    MCV 78.1  78.0 - 100.0 (fL)    MCH 24.9 (*) 26.0 - 34.0 (pg)    MCHC 31.9  30.0 - 36.0 (g/dL)    RDW 40.9 (*) 81.1 - 15.5 (%)    Platelets 204  150 - 400 (K/uL)   BASIC METABOLIC PANEL     Status: Abnormal   Collection Time   07/22/11  6:55 AM      Component Value Range Comment   Sodium 136  135 - 145 (mEq/L)    Potassium 3.8  3.5 - 5.1 (mEq/L)    Chloride 107  96 - 112 (mEq/L)    CO2 22  19 - 32 (mEq/L)    Glucose, Bld 98  70 - 99 (mg/dL)    BUN <3 (*) 6 - 23 (mg/dL) REPEATED TO VERIFY   Creatinine, Ser 0.87  0.50 - 1.10 (mg/dL)    Calcium 7.8 (*) 8.4 - 10.5 (mg/dL)    GFR calc non Af Amer >90  >90 (mL/min)    GFR calc Af Amer >90  >90 (mL/min)     Procedures/Imaging:  Dg Chest 2 View  07/19/2011  CHEST - 2 VIEW  IMPRESSION:  1.  Low lung volumes and minimal bibasilar atelectasis. 2.  No other significant airspace consolidation.    US Ob  Comp Less 14 Wks  06/28/2011  OBSTETRIC <14 WK ULTRASOUND  IMPRESSION: No intrauterine gestational sac seen.  Large amount of clot noted within the uterus, with thickening of the endometrial echo complex to 4.2 cm.  Focally increased blood flow noted anteriorly on the right side of the uterus, raising concern for underlying retained products of conception.  Findings were discussed with Georges Mouse NP at 05:44 a.m. on 06/28/2011.    US Transvaginal Non-ob  07/18/2011  TRANSABDOMINAL AND TRANSVAGINAL ULTRASOUND OF PELVIS  IMPRESSION:  1.  No evidence for retained products of conception. 2.  Bilateral ovarian cysts, measuring 4.2 cm on the left and 3.1 cm on the right.  The left-sided ovarian cyst is mildly complex. Given the rapidity of onset and the lack of associated symptoms, these are likely physiologic. 3.  Prominent Nabothian cysts noted at the cervix. 4.  No evidence for ovarian torsion.   US Pelvis Complete  07/18/2011  TRANSABDOMINAL AND TRANSVAGINAL ULTRASOUND OF PELVISIMPRESSION:  1.  No evidence for retained products of conception. 2.  Bilateral ovarian cysts, measuring 4.2 cm on the left and 3.1 cm on the right.  The left-sided ovarian cyst is mildly complex. Given the rapidity of onset and the lack of associated symptoms, these are likely physiologic. 3.  Prominent Nabothian cysts noted at the cervix. 4.  No evidence for ovarian torsion.   Ct Abdomen Pelvis W Contrast  07/18/2011  CT ABDOMEN  AND PELVIS WITH CONTRAST IMPRESSION: Findings compatible with right pyelonephritis.  No discrete abscess identified.  Splenomegaly.  Fatty infiltration of the liver.       Brief Hospital Course: 26 year-old female with a past medical history is significant for 12 week spontaneous abortion, complicated by retained products of conception 3 weeks ago presents with a five-day history of generalized abdominal pain accompanied with nausea vomiting.  Found to have temperature 103 on admission.  1.  Pyelonephritis:  Patient completed 5 day course of CTX IV then transitioned to Cipro 400 PO.  Urine culture grew E.coli.  Patient remained afebrile for > 24 hours prior to discharge.  CVA tenderness/low back pain was well controlled on Percocet and Tylenol PRN.  She complained of nausea and one episode of emesis with breakfast prior to discharge, but this improved after one dose of Zofran.  Patient was rehydrated with maintenance IVF.  Will discharge patient home with Zofran ODT PRN.  Patient to follow up with PCP and has an appointment with [facility].  2. Cough/Sore throat: likely secondary to URI, viral.  Mono spot NEG. Treated with Tessalon perles and Cepacol cough crops.  Cough improved throughout hospital course.  Encouraged PO fluids and plenty of rest.    3. FEN/GI:  Patient tolerated regular diet prior to discharge.    4. Retained POC after spontaneous abortion 12/7; repeat US on 12/27 showed no evidence of POC.  [facility]   Patient condition at time of discharge/disposition:  Patient is medically stable for discharge.   Follow up issues: 1. Right pyelonephritis 2. Spontaneous abortion 3. Viral URI  Discharge follow up: Discharge Orders    Future Appointments: Provider: Department: Dept Phone: Center:   07/31/2011 3:15 PM Deirdre Christy Gentles, CNM [facility] [phone] [facility]      Ramin [place], [place] [facility]

## 2011-07-24 LAB — BASIC METABOLIC PANEL
BUN: <3 mg/dL — ABNORMAL LOW (ref 6–23)
GFR calc Af Amer: >90 mL/min (ref >90)
GFR calc non Af Amer: >90 mL/min (ref >90)
Potassium: 3.9 mEq/L (ref 3.5–5.1)
Sodium: 137 mEq/L (ref 135–145)

## 2011-07-24 NOTE — Progress Notes (Signed)
Clinical Child psychotherapist (CSW) spoke with pt who stated her husband would provide her with clothing and transportation home. Pt declines needing transportation home. CSW signing off. Theresia Bough, MSW, Theresia Majors [phone]

## 2011-07-24 NOTE — Discharge Summary (Signed)
Physician Discharge Summary  Patient ID: [name] [name] [id number] 1985-09-07 26 y.o.  Admit date: 07/18/2011 Discharge date: 07/24/2011  PCP: none, patient new to [facility] and request f/u at [facility].    Discharge Diagnosis: 1. Right Pyelonephritis 2. Retained products of conception s/p spontaneous abortion, resolved. 3. Viral URI   Discharge Medications  [name], [name]  Home Medication Instructions [id number]   Printed on:07/23/11 1129  Medication Information                    acetaminophen (TYLENOL) 325 MG tablet Take 2 tablets (650 mg total) by mouth every 4 (four) hours as needed for fever.           benzonatate (TESSALON) 100 MG capsule Take 1 capsule (100 mg total) by mouth 3 (three) times daily as needed for cough.           ciprofloxacin (CIPRO) 500 MG tablet Take 1 tablet (500 mg total) by mouth 2 (two) times daily. Take twice a day fr 4 more days.           menthol-cetylpyridinium (CEPACOL) 3 MG lozenge Take 1 lozenge (3 mg total) by mouth as needed.           ondansetron (ZOFRAN) 4 MG tablet Take 1 tablet (4 mg total) by mouth every 6 (six) hours as needed for nausea.             Labs: CBC  Lab 07/22/11 0655 07/21/11 0544 07/19/11 0655  WBC 5.9 5.7 10.1  HGB 8.3* 8.6* 8.7*  HCT 26.0* 27.1* 27.1*  PLT 204 185 158   BMET  Lab 07/22/11 0655 07/21/11 0544 07/19/11 0655 07/18/11 1915  NA 136 134* 134* --  K 3.8 3.4* 3.6 --  CL 107 104 100 --  CO2 22 24 23  --  BUN <3* 5* 9 --  CREATININE 0.87 0.94 1.09 --  CALCIUM 7.8* 7.4* 7.4* --  PROT -- -- -- 6.6  BILITOT -- -- -- 0.7  ALKPHOS -- -- -- 72  ALT -- -- -- 17  AST -- -- -- 27  GLUCOSE 98 99 115* --    Procedures/Imaging:  Dg Chest 2 View  07/19/2011  CHEST - 2 VIEW  IMPRESSION:  1.  Low lung volumes and minimal bibasilar atelectasis. 2.  No other significant airspace consolidation.    US Ob Comp Less 14 Wks  06/28/2011  OBSTETRIC <14 WK ULTRASOUND  IMPRESSION: No intrauterine  gestational sac seen.  Large amount of clot noted within the uterus, with thickening of the endometrial echo complex to 4.2 cm.  Focally increased blood flow noted anteriorly on the right side of the uterus, raising concern for underlying retained products of conception.  Findings were discussed with Georges Mouse NP at 05:44 a.m. on 06/28/2011.    US Transvaginal Non-ob  07/18/2011  TRANSABDOMINAL AND TRANSVAGINAL ULTRASOUND OF PELVIS  IMPRESSION:  1.  No evidence for retained products of conception. 2.  Bilateral ovarian cysts, measuring 4.2 cm on the left and 3.1 cm on the right.  The left-sided ovarian cyst is mildly complex. Given the rapidity of onset and the lack of associated symptoms, these are likely physiologic. 3.  Prominent Nabothian cysts noted at the cervix. 4.  No evidence for ovarian torsion.   US Pelvis Complete  07/18/2011  TRANSABDOMINAL AND TRANSVAGINAL ULTRASOUND OF PELVISIMPRESSION:  1.  No evidence for retained products of conception. 2.  Bilateral ovarian cysts, measuring 4.2 cm on the left and  3.1 cm on the right.  The left-sided ovarian cyst is mildly complex. Given the rapidity of onset and the lack of associated symptoms, these are likely physiologic. 3.  Prominent Nabothian cysts noted at the cervix. 4.  No evidence for ovarian torsion.   Ct Abdomen Pelvis W Contrast  07/18/2011  CT ABDOMEN AND PELVIS WITH CONTRAST IMPRESSION: Findings compatible with right pyelonephritis.  No discrete abscess identified.  Splenomegaly.  Fatty infiltration of the liver.       Brief Hospital Course: 26 year-old female with a past medical history is significant for 12 week spontaneous abortion, complicated by retained products of conception 3 weeks ago presents with a five-day history of generalized abdominal pain accompanied with nausea vomiting.  Found to have temperature 103 on admission.  1. Pyelonephritis:  Patient completed 5 day course of CTX IV then transitioned to Cipro 400 PO.   Urine culture grew E.coli.  Patient remained afebrile for > 24 hours prior to discharge.  CVA tenderness/low back pain was well controlled on Percocet and Tylenol PRN.  She tolerated breakfast on day of discharge without emesis. Patient to follow up with Dr. Tye Savoy and has an appointment with [facility].  2. Cough/Sore throat: likely secondary to URI, viral.  Mono spot NEG. Treated with Tessalon perles and Cepacol cough crops.  Cough improved throughout hospital course.  Encouraged PO fluids and plenty of rest.    3. FEN/GI:  Patient tolerated regular diet prior to discharge.    4. Retained POC after spontaneous abortion 12/7; repeat US on 12/27 showed no evidence of POC.  [facility]   Patient condition at time of discharge/disposition:  Patient is medically stable for discharge.   Follow up issues: 1. Right pyelonephritis 2. Spontaneous abortion 3. Viral URI  Discharge follow up: Discharge Orders    Future Appointments: Provider: Department: Dept Phone: Center:   07/31/2011 3:15 PM Deirdre Christy Gentles, CNM [facility] [phone] [facility]        Stasia Cavalier [facility] Family Medicine Resident  2:26 PM 07/24/11

## 2011-07-24 NOTE — Discharge Summary (Signed)
I have reviewed this discharge summary and agree.    

## 2011-07-25 LAB — CULTURE, BLOOD (ROUTINE X 2): Culture  Setup Time: 201212281931

## 2011-07-29 ENCOUNTER — Ambulatory Visit: Payer: Medicaid Other | Admitting: Family Medicine

## 2011-07-31 ENCOUNTER — Encounter: Payer: Medicaid Other | Admitting: Obstetrics and Gynecology

## 2011-10-23 ENCOUNTER — Emergency Department (HOSPITAL_COMMUNITY): Payer: Medicaid Other

## 2011-10-23 ENCOUNTER — Encounter (HOSPITAL_COMMUNITY): Payer: Self-pay | Admitting: *Deleted

## 2011-10-23 ENCOUNTER — Emergency Department (HOSPITAL_COMMUNITY)
Admission: EM | Admit: 2011-10-23 | Discharge: 2011-10-24 | Disposition: A | Discharge status: Home / Self Care | Payer: Medicaid Other | Attending: Emergency Medicine | Admitting: Emergency Medicine

## 2011-10-23 ENCOUNTER — Other Ambulatory Visit: Payer: Self-pay

## 2011-10-23 DIAGNOSIS — R0789 Other chest pain: Secondary | ICD-10-CM | POA: Insufficient documentation

## 2011-10-23 DIAGNOSIS — F319 Bipolar disorder, unspecified: Secondary | ICD-10-CM | POA: Insufficient documentation

## 2011-10-23 DIAGNOSIS — F341 Dysthymic disorder: Secondary | ICD-10-CM | POA: Insufficient documentation

## 2011-10-23 DIAGNOSIS — R51 Headache: Secondary | ICD-10-CM | POA: Insufficient documentation

## 2011-10-23 LAB — BASIC METABOLIC PANEL
Chloride: 101 mEq/L (ref 96–112)
GFR calc Af Amer: >90 mL/min (ref >90)
GFR calc non Af Amer: 84 mL/min — ABNORMAL LOW (ref >90)
Glucose, Bld: 86 mg/dL (ref 70–99)
Potassium: 4.2 mEq/L (ref 3.5–5.1)
Sodium: 137 mEq/L (ref 135–145)

## 2011-10-23 LAB — CBC
Hemoglobin: 12.2 g/dL (ref 12.0–15.0)
MCHC: 31.8 g/dL (ref 30.0–36.0)
RBC: 4.86 MIL/uL (ref 3.87–5.11)
WBC: 11.4 10*3/uL — ABNORMAL HIGH (ref 4.0–10.5)

## 2011-10-23 LAB — TROPONIN I: Troponin I: <0.3 ng/mL (ref <0.30)

## 2011-10-23 NOTE — ED Provider Notes (Signed)
History    This chart was scribed for [name] Bonier, MD, MD by Smitty Pluck. The patient was seen in room APA09 and the patient's care was started at 9:15PM.   CSN: [id number]  Arrival date & time 10/23/11  2025   First MD Initiated Contact with Patient 10/23/11 2041      Chief Complaint  Patient presents with  . Chest Pain    (Consider location/radiation/quality/duration/timing/severity/associated sxs/prior treatment) Patient is a 26 y.o. female presenting with chest pain. The history is provided by the patient.  Chest Pain    [name] [name] is a 26 y.o. female who presents to the Emergency Department complaining of moderate left chest pain radiating to right onset 1 week ago. She reports getting a little sweaty with pain. The last time it happened was 4 hours ago. Pt reports that the pain is sharp. Pt reports that today she had a pain that made her "buckle." She denies any specific actions that causes pain. The pain last for about 10-20 minutes. She states that her chest feels heavy at the moment. She denies fevers. She had a TB infection 3 months ago. She reports that her left arm has mild pain. Denies HTN and diabetes. She has family hx of MI (dad was 30-30 yrs old when he had MI). Denies smoking.  [facility]  Past Medical History  Diagnosis Date  . Abnormal Pap smear   . Anxiety   . Depression   . Bipolar 1 disorder     Past Surgical History  Procedure Date  . Tooth extraction     History reviewed. No pertinent family history.  History  Substance Use Topics  . Smoking status: Former Smoker    Quit date: 05/21/2007  . Smokeless tobacco: Never Used  . Alcohol Use: No    OB History    Grav Para Term Preterm Abortions TAB SAB Ect Mult Living   2 1 1  0 1 0 1 0 0 1      Review of Systems  Cardiovascular: Positive for chest pain.  All other systems reviewed and are negative.   10 Systems reviewed and all are negative for acute change except as noted  in the HPI.   Allergies  Shellfish allergy  Home Medications   Current Outpatient Rx  Name Route Sig Dispense Refill  . MENTHOL 3 MG MT LOZG Oral Take 1 lozenge (3 mg total) by mouth as needed. 100 tablet 0    BP 125/73  Pulse 100  Temp(Src) 97.3 F (36.3 C) (Oral)  Resp 16  Ht 5\' 6"  (1.676 m)  Wt 263 lb (119.296 kg)  BMI 42.45 kg/m2  SpO2 100%  LMP 09/06/2011  Breastfeeding? No  Physical Exam  Nursing note and vitals reviewed. Constitutional: She is oriented to person, place, and time. She appears well-developed and well-nourished. No distress.  HENT:  Head: Normocephalic and atraumatic.  Eyes: EOM are normal. Pupils are equal, round, and reactive to light.  Cardiovascular: Normal rate, regular rhythm and normal heart sounds.  Exam reveals no gallop and no friction rub.   No murmur heard.      Good radial pulse in left radial artery Good profusion of hand in left   Pulmonary/Chest: Effort normal and breath sounds normal. No respiratory distress. She has no wheezes. She has no rales.  Abdominal: Soft. Bowel sounds are normal. She exhibits no distension. There is no tenderness. There is no rebound and no guarding.  Musculoskeletal: She exhibits  no tenderness.  Neurological: She is alert and oriented to person, place, and time.  Skin: Skin is warm and dry.  Psychiatric: She has a normal mood and affect. Her behavior is normal.    ED Course  Procedures (including critical care time) DIAGNOSTIC STUDIES: Oxygen Saturation is 100% on room air, normal by my interpretation.    COORDINATION OF CARE: 9:19PM EDP discusses pt ED treatment with pt    Labs Reviewed  CBC - Abnormal; Notable for the following:    WBC 11.4 (*)    MCH 25.1 (*)    All other components within normal limits  BASIC METABOLIC PANEL - Abnormal; Notable for the following:    GFR calc non Af Amer 84 (*)    All other components within normal limits  PRO B NATRIURETIC PEPTIDE  TROPONIN I   Chest  Portable 1 View  10/23/2011  *RADIOLOGY REPORT*  Clinical Data: Chest pain and headache.  PORTABLE CHEST - 1 VIEW  Comparison: 07/19/2011  Findings: Shallow inspiration.  Heart size and pulmonary vascularity are normal for technique.  No focal airspace consolidation in the lungs.  No blunting of costophrenic angles. No pneumothorax.  Atelectasis seen previously has resolved.  IMPRESSION: No evidence of active pulmonary disease.  Original Report Authenticated By: Marlon Pel, M.D.     No diagnosis found.   Date: 10/24/2011  Rate: 99  Rhythm: normal sinus rhythm  QRS Axis: normal  Intervals: normal  ST/T Wave abnormalities: normal  Conduction Disutrbances: none  Narrative Interpretation: unremarkable      MDM  Musculoskeletal chest pain, costochondritis, GERD, Gastrointestinal Chest Pain, Pleuritic Chest Pain, Pneumonia, Pneumothorax, Pulmonary Embolism, Esophageal Spasm, Arrhythmia considered among other potential etiologies in the patient's differential diagnosis. Unstable Angina, Acute Coronary Syndrome, Myocardial infarction are thought less likely based on lack of CAD risk factors, atypical features of chest pain, normal ECG and physical exam.  I personally performed the services described in this documentation, which was scribed in my presence. The recorded information has been reviewed and considered.         [name] Bonier, MD 10/24/11 [phone]

## 2011-10-23 NOTE — ED Notes (Signed)
Chest pain and headache,  NO cough or cold sx, no fever.

## 2011-10-24 MED ORDER — IBUPROFEN 200 MG PO TABS: 800.0000 mg | ORAL_TABLET | Freq: Three times a day (TID) | ORAL | Status: DC | PRN

## 2011-10-24 NOTE — Discharge Instructions (Signed)
Cardiac Biomarkers Cardiac biomarkers are enzymes, proteins, and hormones that are associated with heart function, damage or failure. Some of the tests are specific for the heart while others are also elevated with skeletal muscle damage. Cardiac biomarkers are used for diagnostic and prognostic purposes and are frequently ordered by caregivers when someone comes into the Emergency Room complaining of symptoms, such as chest pain, pressure, nausea, and shortness of breath. These tests are ordered, along with other laboratory and non-laboratory tests, to detect heart failure (which is often a chronic, progressive condition affecting the ability of the heart to fill with blood and pump efficiently) and the acute coronary syndromes (ACS) as well as to help determine prognosis for people who have had a heart attack. ACS is a group of symptoms that reflect a sudden decrease in the amount of blood and oxygen, also termed 'ischemia,' reaching the heart. This decrease is frequently due to either a narrowing of the coronary arteries (atherosclerosis or vessel spasm) or unstable plaques, which can cause a blood clot (thrombus) and blockage of blood flow. If the oxygen supply is low, it can cause angina (pain); if blood flow is reduced, it can cause death of heart cells (called myocardial infarction or heart attack) and can lead to death of the affected heart muscle cells and to permanent damage and scarring of the heart.  The goal with cardiac biomarkers is to be able to detect the presence and severity of an acute heart condition as soon as possible so that appropriate treatment can be initiated.  There are only a few cardiac biomarkers that are being routinely used by physicians. Some have been phased out because they are not as specific as the marker of choice - troponin. Many other potential cardiac biomarkers are still being researched but their clinical utility has yet to be established.  Note: Cardiac biomarkers  are not the same tests as those that are used to screen the general healthy population for their risk of developing heart disease. Those can be found under Cardiac Risk Assessment. LABORATORY TESTS CURRENT CARDIAC BIOMARKERS   CK and CK-MB   BNP or (NT-proBNP)   Troponin   Myoglobin (not always used; sometimes ordered with troponin)  MORE GENERAL TESTS FREQUENTLY ORDERED ALONG WITH CARDIAC BIOMARKERS   Blood Gases   CMP   BMP   Electrolytes   CBC  ON THE HORIZON Ischemia modified albumin (IMA) - Test has received FDA approval for use with troponin and electrocardiogram to rule out acute coronary syndrome (ACS) in patients with chest pain. May become useful for identifying patients at higher risk of heart attack and potentially could replace myoglobin one day.  NON-LABORATORY TESTS These tests allow caregivers to look at the size, shape, and function of the heart as it is beating. They can be used to detect changes to the rhythm of the heart as well as to detect and evaluate damaged tissues and blocked arteries.   EKG (ECG, electrocardiogram)   Coronary angiography (or arteriography)   Stress testing   Nuclear scan   ECG (echocardiogram)   Chest X-ray  THE FOLLOWING SUMMARIZES CURRENTLY USED CARDIAC BIOMARKERS. Marker: CK  What: Enzyme that exists in three different isoforms   Where Found: Heart, brain, and skeletal muscle   What Indicates: Injury to muscle cells   Time to Increase: 4 to 6 hours after injury, peaks in 18 to 24 hours   Time back to Normal: Normal in 48 to 72 hours, unless due to continuing injury  When/How Used: Being phased out, may be ordered prior to CK-MB  Marker: CK-MB  What: Heart- related portion of total CK enzyme   Where Found: Heart primarily, but also in skeletal muscle   What Indicates: Injury (cell death) to heart   Time to Increase: 4 to 6 hrs after heart attack, peaks in 12 to 20 hours   Time back to Normal: Returns to normal  in 24 to 48 hours unless new/continual damage   When/How Used: Not as specific as Troponin for heart injury/attack, may be ordered when Troponin is not available, may be ordered to monitor new/continuing damage  Marker: Myoglobin  What: Small oxygen-storing protein   Where Found: Heart and other muscle cells   What Indicates: Injury to heart or other muscle cells. Also elevated with kidney problems.   Time to Increase: Starts to rise within 2 to 3 hours, peaks in 8 to 12 hours.   Time back to Normal: Falls back to normal by about one day after injury occurred   When/How Used: Ordered along with Troponin, helps diagnose heart injury/attack  Marker: Cardiac Troponin  What: Components of a Regulatory protein complex. Two cardiac specific isoforms: T and I   Where Found: Heart muscle   What Indicates: Heart injury/damage   Time to Increase: 4 to 8 hours   Time back to Normal: Remains elevated for 7 to 14 days   When/How Used: Ordered to help assess prognosis and diagnose heart attack  Marker: LDH  What: Enzyme   Where Found: Almost all body tissues   What Indicates: General marker of injury to cells   When/How Used: Phased out, not specific  Marker: AST  What: Enzyme   Where Found: Almost all body tissues   What Indicates: General marker of injury to cells   When/How Used: Phased out, not specific  Marker: Hs-CRP  What: Protein   Where Found: Associated with athero-sclerosis   What Indicates: Inflammatory process   Time back to Normal: Elevated with inflammation   When/How Used: May help determine prognosis of patients who have had heart attack  Marker: BNP  What: Hormone   Where Found: Heart's left ventricle   What Indicates: Heart failure   Time back to Normal: Elevation related to severity   When/How Used: Help diagnose and evaluate heart failure, prognosis, and to monitor therapy  Document Released: 07/31/2004 Document Revised: 06/27/2011 Document  Reviewed: 04/17/2005 [facility] Patient Information 2012 [place], [place].Chest Pain (Nonspecific) It is often hard to give a specific diagnosis for the cause of chest pain. There is always a chance that your pain could be related to something serious, such as a heart attack or a blood clot in the lungs. You need to follow up with your caregiver for further evaluation. CAUSES   Heartburn.   Pneumonia or bronchitis.   Anxiety or stress.   Inflammation around your heart (pericarditis) or lung (pleuritis or pleurisy).   A blood clot in the lung.   A collapsed lung (pneumothorax). It can develop suddenly on its own (spontaneous pneumothorax) or from injury (trauma) to the chest.   Shingles infection (herpes zoster virus).  The chest wall is composed of bones, muscles, and cartilage. Any of these can be the source of the pain.  The bones can be bruised by injury.   The muscles or cartilage can be strained by coughing or overwork.   The cartilage can be affected by inflammation and become sore (costochondritis).  DIAGNOSIS  Lab tests or other  studies, such as X-rays, electrocardiography, stress testing, or cardiac imaging, may be needed to find the cause of your pain.  TREATMENT   Treatment depends on what may be causing your chest pain. Treatment may include:   Acid blockers for heartburn.   Anti-inflammatory medicine.   Pain medicine for inflammatory conditions.   Antibiotics if an infection is present.   You may be advised to change lifestyle habits. This includes stopping smoking and avoiding alcohol, caffeine, and chocolate.   You may be advised to keep your head raised (elevated) when sleeping. This reduces the chance of acid going backward from your stomach into your esophagus.   Most of the time, nonspecific chest pain will improve within 2 to 3 days with rest and mild pain medicine.  HOME CARE INSTRUCTIONS   If antibiotics were prescribed, take your antibiotics as  directed. Finish them even if you start to feel better.   For the next few days, avoid physical activities that bring on chest pain. Continue physical activities as directed.   Do not smoke.   Avoid drinking alcohol.   Only take over-the-counter or prescription medicine for pain, discomfort, or fever as directed by your caregiver.   Follow your caregiver's suggestions for further testing if your chest pain does not go away.   Keep any follow-up appointments you made. If you do not go to an appointment, you could develop lasting (chronic) problems with pain. If there is any problem keeping an appointment, you must call to reschedule.  SEEK MEDICAL CARE IF:   You think you are having problems from the medicine you are taking. Read your medicine instructions carefully.   Your chest pain does not go away, even after treatment.   You develop a rash with blisters on your chest.  SEEK IMMEDIATE MEDICAL CARE IF:   You have increased chest pain or pain that spreads to your arm, neck, jaw, back, or abdomen.   You develop shortness of breath, an increasing cough, or you are coughing up blood.   You have severe back or abdominal pain, feel nauseous, or vomit.   You develop severe weakness, fainting, or chills.   You have a fever.  THIS IS AN EMERGENCY. Do not wait to see if the pain will go away. Get medical help at once. Call your local emergency services (911 in [place] Do not drive yourself to the hospital. MAKE SURE YOU:   Understand these instructions.   Will watch your condition.   Will get help right away if you are not doing well or get worse.  Document Released: 04/17/2005 Document Revised: 06/27/2011 Document Reviewed: 02/11/2008 [facility] Patient Information 2012 [place], [place].Electrocardiography An electrocardiogram is also known as an EKG or ECG. An EKG records the electrical impulses of the heart and is an important test to assess heart health. It provides insight regarding  your heart rhythm, heart size, heart function and can can provide information as to whether you have had a heart attack. An EKG may be part of a routine physical exam and is done if you have experienced chest pain.  PROCEDURE   You will be asked to remove your clothes from the waist up, wear a hospital gown and lie down on your back for the test.   Electrodes or sticky patches are placed on your arms, legs and chest.   The electrodes are attached by wires to an EKG machine which records the electrical activity of your heart.   You will be asked to  relax and lie still for a few seconds.   The EKG test is simple, safe and painless. It takes only a few minutes to perform the test. You cannot be shocked by the machine and no electricity goes through your body.  AFTER THE EKG  If the EKG was part of a routine physical exam, you may return to normal activities as told by your caregiver.   If the EKG was done to evaluate chest pain, you may need additional testing as determined by your caregiver for your safety.   Your doctor or a specially trained heart doctor (Cardiologist) will read the EKG results.   Not all test results are available during your visit. If your test results are not back during the visit, make an appointment with your caregiver to find out the results. Do not assume everything is normal if you have not heard from your caregiver or the medical facility. It is important for you to follow up on all of your test results.  Document Released: 07/05/2000 Document Revised: 06/27/2011 Document Reviewed: 04/27/2008 [facility] Patient Information 2012 [place], [place].Heart Attack in Women Heart attack (myocardial infarction) is one of the leading causes of sudden, unexpected death in women. Early recognition of heart attack symptoms is critical. Do not ignore heart attack symptoms. If you experience heart attack symptoms, get immediate help. Early treatment helps reduce heart damage. CAUSES    A heart attack happens because the heart (coronary) arteries become blocked by fatty deposits (plaque) or blood clots. This reduces the oxygen and blood supply to the heart. When one or more of the heart arteries becomes blocked, that area of the heart will begin to die, causing the pain felt during a heart attack.  RISK FACTORS In women, as the level of estrogen in the blood decreases after menopause, the risk of heart attack increases. Other risk factors of heart attack in women include:  High blood pressure.   High cholesterol levels.   Diabetes.   Smoking.   Obesity.   Menopause.   Hysterectomy.   Previous heart attack.   Lack of regular exercise.   Family history of heart attacks.  SYMPTOMS  In women, heart attack symptoms may be different than those in men. Women may not experience the typical chest discomfort or pain, which is considered the primary heart attack symptom in men. Women may describe a feeling of pressure, ache, or tightness in the chest. Women may experience new or different physical symptoms sometimes a month or more before a heart attack. Unusual, unexplained fatigue may be the most frequently identified symptom. Sleep disturbances and weakness in the arms may also be considered warning signs.  Other heart attack symptoms that may occur more often in women are:  Unexplained feelings of nervousness or anxiety.   Discomfort between the shoulder blades.   Tingling in the hands and arms.   Swollen arms.   Headaches.  Heart attack symptoms for both men and women include:  Pain or discomfort spreading to the neck, shoulder, arm, or jaw.   Shortness of breath.   Sudden cold sweats.   Pain or discomfort in the abdomen.   Heartburn or indigestion with or without vomiting.   Sudden lightheadedness.   Sudden fainting or blackout.  PREVENTION The following healthy lifestyle habits may help decrease your risk of heart attacks:  Quitting smoking.    Keeping your blood pressure, blood sugar, and cholesterol levels within normal limits.   Maintaining a healthy weight.   Staying physically  active and exercising regularly.   Decreasing your salt intake.   Eating a diet low in saturated fats and cholesterol.   Increasing your fiber intake by including whole grains, vegetables, and fruits in your diet.   Avoiding situations that cause stress, anger, or depression.   Taking medicine as advised by your caregiver.  SEEK IMMEDIATE MEDICAL CARE IF:   You have pain or discomfort in the middle of your chest.   You have pain or discomfort in the upper part of your body, such as the arms, back, neck, jaw, or stomach.   You develop shortness of breath.   You break out into a cold sweat.   You feel nauseous or lightheaded.   You have a fainting episode.   You feel very unusual weakness.   You feel that your heart is pounding very hard or is beating irregularly.  MAKE SURE YOU:   Understand these instructions.   Will watch your condition.   Will get help right away if you are not doing well or get worse.  Document Released: 01/04/2008 Document Revised: 06/27/2011 Document Reviewed: 01/04/2008 [facility] Patient Information 2012 [place], [place].Pain of Unknown Etiology (Pain Without a Known Cause) You have come to your caregiver because of pain. Pain can occur in any part of the body. Often there is not a definite cause. If your laboratory (blood or urine) work was normal and x-rays or other studies were normal, your caregiver may treat you without knowing the cause of the pain. An example of this is the headache. Most headaches are diagnosed by taking a history. This means your caregiver asks you questions about your headaches. Your caregiver determines a treatment based on your answers. Usually testing done for headaches is normal. Often testing is not done unless there is no response to medications. Regardless of where your pain is  located today, you can be given medications to make you comfortable. If no physical cause of pain can be found, most cases of pain will gradually leave as suddenly as they came.  If you have a painful condition and no reason can be found for the pain, It is importantthat you follow up with your caregiver. If the pain becomes worse or does not go away, it may be necessary to repeat tests and look further for a possible cause.  Only take over-the-counter or prescription medicines for pain, discomfort, or fever as directed by your caregiver.   For the protection of your privacy, test results can not be given over the phone. Make sure you receive the results of your test. Ask as to how these results are to be obtained if you have not been informed. It is your responsibility to obtain your test results.   You may continue all activities unless the activities cause more pain. When the pain lessens, it is important to gradually resume normal activities. Resume activities by beginning slowly and gradually increasing the intensity and duration of the activities or exercise. During periods of severe pain, bed-rest may be helpful. Lay or sit in any position that is comfortable.   Ice used for acute (sudden) conditions may be effective. Use a large plastic bag filled with ice and wrapped in a towel. This may provide pain relief.   See your caregiver for continued problems. They can help or refer you for exercises or physical therapy if necessary.  If you were given medications for your condition, do not drive, operate machinery or power tools, or sign legal documents for 24  hours. Do not drink alcohol, take sleeping pills, or take other medications that may interfere with treatment. See your caregiver immediately if you have pain that is becoming worse and not relieved by medications. Document Released: 04/02/2001 Document Revised: 06/27/2011 Document Reviewed: 07/08/2005 [facility] Patient Information 2012  [place], [place].

## 2011-11-05 ENCOUNTER — Encounter (HOSPITAL_COMMUNITY): Payer: Self-pay

## 2011-11-05 ENCOUNTER — Emergency Department (HOSPITAL_COMMUNITY)
Admission: EM | Admit: 2011-11-05 | Discharge: 2011-11-05 | Disposition: A | Discharge status: Home / Self Care | Payer: Medicaid Other | Attending: Emergency Medicine | Admitting: Emergency Medicine

## 2011-11-05 DIAGNOSIS — R300 Dysuria: Secondary | ICD-10-CM | POA: Insufficient documentation

## 2011-11-05 DIAGNOSIS — R112 Nausea with vomiting, unspecified: Secondary | ICD-10-CM | POA: Insufficient documentation

## 2011-11-05 DIAGNOSIS — F3130 Bipolar disorder, current episode depressed, mild or moderate severity, unspecified: Secondary | ICD-10-CM | POA: Insufficient documentation

## 2011-11-05 DIAGNOSIS — Z79899 Other long term (current) drug therapy: Secondary | ICD-10-CM | POA: Insufficient documentation

## 2011-11-05 DIAGNOSIS — Z331 Pregnant state, incidental: Secondary | ICD-10-CM | POA: Insufficient documentation

## 2011-11-05 DIAGNOSIS — F411 Generalized anxiety disorder: Secondary | ICD-10-CM | POA: Insufficient documentation

## 2011-11-05 LAB — URINALYSIS, ROUTINE W REFLEX MICROSCOPIC
Ketones, ur: NEGATIVE mg/dL
Leukocytes, UA: NEGATIVE
Nitrite: NEGATIVE
Protein, ur: NEGATIVE mg/dL
pH: 5.5 (ref 5.0–8.0)

## 2011-11-05 NOTE — ED Provider Notes (Signed)
History     CSN: [id number]  Arrival date & time 11/05/11  1344   First MD Initiated Contact with Patient 11/05/11 1435      Chief Complaint  Patient presents with  . Emesis  . Possible Pregnancy  . Dysuria    (Consider location/radiation/quality/duration/timing/severity/associated sxs/prior treatment) Patient is a 26 y.o. female presenting with dysuria. The history is provided by the patient.  Dysuria  This is a new problem. The current episode started more than 1 week ago. The problem occurs intermittently. The problem has not changed since onset.The quality of the pain is described as burning. The pain is at a severity of 5/10. The pain is moderate. There has been no fever. She is sexually active. There is no history of pyelonephritis. Associated symptoms include nausea and possible pregnancy. Pertinent negatives include no chills, no vomiting, no discharge, no frequency, no hematuria, no hesitancy, no urgency and no flank pain. Associated symptoms comments: She took a home pregnancy test last week which was positive.  Her LMP was mid February, exact date unknown.  She denies vaginal discharge, no vaginal bleeding, pelvic cramping or pain.  She does have early morning nausea and vomited one time several days ago.. She has tried nothing for the symptoms. Her past medical history is significant for recurrent UTIs.    Past Medical History  Diagnosis Date  . Abnormal Pap smear   . Anxiety   . Depression   . Bipolar 1 disorder     Past Surgical History  Procedure Date  . Tooth extraction     No family history on file.  History  Substance Use Topics  . Smoking status: Former Smoker    Quit date: 05/21/2007  . Smokeless tobacco: Never Used  . Alcohol Use: No    OB History    Grav Para Term Preterm Abortions TAB SAB Ect Mult Living   2 1 1  0 1 0 1 0 0 1      Review of Systems  Constitutional: Negative for fever and chills.  HENT: Negative for congestion, sore throat  and neck pain.   Eyes: Negative.   Respiratory: Negative for chest tightness and shortness of breath.   Cardiovascular: Negative for chest pain.  Gastrointestinal: Positive for nausea. Negative for vomiting and abdominal pain.  Genitourinary: Positive for dysuria. Negative for hesitancy, urgency, frequency, hematuria, flank pain, vaginal bleeding, vaginal discharge, vaginal pain and pelvic pain.  Musculoskeletal: Negative for joint swelling and arthralgias.  Skin: Negative.  Negative for rash and wound.  Neurological: Negative for dizziness, weakness, light-headedness, numbness and headaches.  Hematological: Negative.   Psychiatric/Behavioral: Negative.     Allergies  Shellfish allergy  Home Medications   Current Outpatient Rx  Name Route Sig Dispense Refill  . BECLOMETHASONE DIPROPIONATE 40 MCG/ACT IN AERS Inhalation Inhale 1-2 puffs into the lungs 2 (two) times daily.    . IBUPROFEN 200 MG PO TABS Oral Take 4 tablets (800 mg total) by mouth every 8 (eight) hours as needed for pain. 30 tablet 0  . RISPERIDONE 1 MG PO TABS Oral Take 1 mg by mouth 2 (two) times daily.    . ASPIRIN-SALICYLAMIDE-CAFFEINE 325-95-16 MG PO TABS Oral Take 1-2 packets by mouth as needed. FOR HEADACHES      BP 126/74  Pulse 91  Temp(Src) 97.9 F (36.6 C) (Oral)  Resp 16  Ht 5\' 4"  (1.626 m)  Wt 280 lb (127.007 kg)  BMI 48.06 kg/m2  SpO2 100%  LMP 09/06/2011  Physical  Exam  Nursing note and vitals reviewed. Constitutional: She is oriented to person, place, and time. She appears well-developed and well-nourished.  HENT:  Head: Normocephalic and atraumatic.  Eyes: Conjunctivae are normal.  Neck: Normal range of motion.  Cardiovascular: Normal rate, regular rhythm, normal heart sounds and intact distal pulses.   Pulmonary/Chest: Effort normal and breath sounds normal. She has no wheezes.  Abdominal: Soft. Bowel sounds are normal. She exhibits no distension and no mass. There is no tenderness. There is  no rebound and no guarding.  Musculoskeletal: Normal range of motion.  Neurological: She is alert and oriented to person, place, and time.  Skin: Skin is warm and dry.  Psychiatric: She has a normal mood and affect.    ED Course  Procedures (including critical care time)  Labs Reviewed  URINALYSIS, ROUTINE W REFLEX MICROSCOPIC - Abnormal; Notable for the following:    Specific Gravity, Urine >1.030 (*)    All other components within normal limits  POCT PREGNANCY, URINE - Abnormal; Notable for the following:    Preg Test, Ur POSITIVE (*)    All other components within normal limits  URINE CULTURE   No results found.   1. Pregnancy as incidental finding       MDM  Positive pregnancy test.  Pelvic exam was not performed as patient denies any vaginal discharge bleeding or pelvic pain.  Urine has been sent for culture, but completely normal UA at this time - treatment for urinary symptoms pending culture results.  She was prescribed a prenatal vitamin and was encouraged to follow up with Dr. Emelda Fear who is her gynecologist.  Patient was completely comfortable during his ED visit with no nausea, no complaints of pain.  The patient appears reasonably screened and/or stabilized for discharge and I doubt any other medical condition or other [facility] requiring further screening, evaluation, or treatment in the ED at this time prior to discharge.         Candis Musa, PA 11/05/11 1805

## 2011-11-05 NOTE — ED Notes (Signed)
Nausea, vomiting, feels light headed at times.   Thinks she may be pregnant.  Dysuria

## 2011-11-05 NOTE — ED Notes (Signed)
Pt reports with multiple complaints. " I took a pregnancy last week and 3 days ago and it was positive. Im not sure if it's my kidney acting up or if Im pregnant. Im sick and hurts to pee really bad"

## 2011-11-05 NOTE — Discharge Instructions (Signed)
ABCs of Pregnancy A Antepartum care is very important. Be sure you see your doctor and get prenatal care as soon as you think you are pregnant. At this time, you will be tested for infection, genetic abnormalities and potential problems with you and the pregnancy. This is the time to discuss diet, exercise, work, medications, labor, pain medication during labor and the possibility of a cesarean delivery. Ask any questions that may concern you. It is important to see your doctor regularly throughout your pregnancy. Avoid exposure to toxic substances and chemicals - such as cleaning solvents, lead and mercury, some insecticides, and paint. Pregnant women should avoid exposure to paint fumes, and fumes that cause you to feel ill, dizzy or faint. When possible, it is a good idea to have a pre-pregnancy consultation with your caregiver to begin some important recommendations your caregiver suggests such as, taking folic acid, exercising, quitting smoking, avoiding alcoholic beverages, etc. B Breastfeeding is the healthiest choice for both you and your baby. It has many nutritional benefits for the baby and health benefits for the mother. It also creates a very tight and loving bond between the baby and mother. Talk to your doctor, your family and friends, and your employer about how you choose to feed your baby and how they can support you in your decision. Not all birth defects can be prevented, but a woman can take actions that may increase her chance of having a healthy baby. Many birth defects happen very early in pregnancy, sometimes before a woman even knows she is pregnant. Birth defects or abnormalities of any child in your or the father's family should be discussed with your caregiver. Get a good support bra as your breast size changes. Wear it especially when you exercise and when nursing.  C Celebrate the news of your pregnancy with the your spouse/father and family. Childbirth classes are helpful to  take for you and the spouse/father because it helps to understand what happens during the pregnancy, labor and delivery. Cesarean delivery should be discussed with your doctor so you are prepared for that possibility. The pros and cons of circumcision if it is a boy, should be discussed with your pediatrician. Cigarette smoking during pregnancy can result in low birth weight babies. It has been associated with infertility, miscarriages, tubal pregnancies, infant death (mortality) and poor health (morbidity) in childhood. Additionally, cigarette smoking may cause long-term learning disabilities. If you smoke, you should try to quit before getting pregnant and not smoke during the pregnancy. Secondary smoke may also harm a mother and her developing baby. It is a good idea to ask people to stop smoking around you during your pregnancy and after the baby is born. Extra calcium is necessary when you are pregnant and is found in your prenatal vitamin, in dairy products, green leafy vegetables and in calcium supplements. D A healthy diet according to your current weight and height, along with vitamins and mineral supplements should be discussed with your caregiver. Domestic abuse or violence should be made known to your doctor right away to get the situation corrected. Drink more water when you exercise to keep hydrated. Discomfort of your back and legs usually develops and progresses from the middle of the second trimester through to delivery of the baby. This is because of the enlarging baby and uterus, which may also affect your balance. Do not take illegal drugs. Illegal drugs can seriously harm the baby and you. Drink extra fluids (water is best) throughout pregnancy to help   your body keep up with the increases in your blood volume. Drink at least 6 to 8 glasses of water, fruit juice, or milk each day. A good way to know you are drinking enough fluid is when your urine looks almost like clear water or is very light  yellow.  E Eat healthy to get the nutrients you and your unborn baby need. Your meals should include the five basic food groups. Exercise (30 minutes of light to moderate exercise a day) is important and encouraged during pregnancy, if there are no medical problems or problems with the pregnancy. Exercise that causes discomfort or dizziness should be stopped and reported to your caregiver. Emotions during pregnancy can change from being ecstatic to depression and should be understood by you, your partner and your family. F Fetal screening with ultrasound, amniocentesis and monitoring during pregnancy and labor is common and sometimes necessary. Take 400 micrograms of folic acid daily both before, when possible, and during the first few months of pregnancy to reduce the risk of birth defects of the brain and spine. All women who could possibly become pregnant should take a vitamin with folic acid, every day. It is also important to eat a healthy diet with fortified foods (enriched grain products, including cereals, rice, breads, and pastas) and foods with natural sources of folate (orange juice, green leafy vegetables, beans, peanuts, broccoli, asparagus, peas, and lentils). The father should be involved with all aspects of the pregnancy including, the prenatal care, childbirth classes, labor, delivery, and postpartum time. Fathers may also have emotional concerns about being a father, financial needs, and raising a family. G Genetic testing should be done appropriately. It is important to know your family and the father's history. If there have been problems with pregnancies or birth defects in your family, report these to your doctor. Also, genetic counselors can talk with you about the information you might need in making decisions about having a family. You can call a major medical center in your area for help in finding a board-certified genetic counselor. Genetic testing and counseling should be done  before pregnancy when possible, especially if there is a history of problems in the mother's or father's family. Certain ethnic backgrounds are more at risk for genetic defects. H Get familiar with the hospital where you will be having your baby. Get to know how long it takes to get there, the labor and delivery area, and the hospital procedures. Be sure your medical insurance is accepted there. Get your home ready for the baby including, clothes, the baby's room (when possible), furniture and car seat. Hand washing is important throughout the day, especially after handling raw meat and poultry, changing the baby's diaper or using the bathroom. This can help prevent the spread of many bacteria and viruses that cause infection. Your hair may become dry and thinner, but will return to normal a few weeks after the baby is born. Heartburn is a common problem that can be treated by taking antacids recommended by your caregiver, eating smaller meals 5 or 6 times a day, not drinking liquids when eating, drinking between meals and raising the head of your bed 2 to 3 inches. I Insurance to cover you, the baby, doctor and hospital should be reviewed so that you will be prepared to pay any costs not covered by your insurance plan. If you do not have medical insurance, there are usually clinics and services available for you in your community. Take 30 milligrams of iron during   your pregnancy as prescribed by your doctor to reduce the risk of low red blood cells (anemia) later in pregnancy. All women of childbearing age should eat a diet rich in iron. J There should be a joint effort for the mother, father and any other children to adapt to the pregnancy financially, emotionally, and psychologically during the pregnancy. Join a support group for moms-to-be. Or, join a class on parenting or childbirth. Have the family participate when possible. K Know your limits. Let your caregiver know if you experience any of the  following:   Pain of any kind.   Strong cramps.   You develop a lot of weight in a short period of time (5 pounds in 3 to 5 days).   Vaginal bleeding, leaking of amniotic fluid.   Headache, vision problems.   Dizziness, fainting, shortness of breath.   Chest pain.   Fever of 102 F (38.9 C) or higher.   Gush of clear fluid from your vagina.   Painful urination.   Domestic violence.   Irregular heartbeat (palpitations).   Rapid beating of the heart (tachycardia).   Constant feeling sick to your stomach (nauseous) and vomiting.   Trouble walking, fluid retention (edema).   Muscle weakness.   If your baby has decreased activity.   Persistent diarrhea.   Abnormal vaginal discharge.   Uterine contractions at 20-minute intervals.   Back pain that travels down your leg.  L Learn and practice that what you eat and drink should be in moderation and healthy for you and your baby. Legal drugs such as alcohol and caffeine are important issues for pregnant women. There is no safe amount of alcohol a woman can drink while pregnant. Fetal alcohol syndrome, a disorder characterized by growth retardation, facial abnormalities, and central nervous system dysfunction, is caused by a woman's use of alcohol during pregnancy. Caffeine, found in tea, coffee, soft drinks and chocolate, should also be limited. Be sure to read labels when trying to cut down on caffeine during pregnancy. More than 200 foods, beverages, and over-the-counter medications contain caffeine and have a high salt content! There are coffees and teas that do not contain caffeine. M Medical conditions such as diabetes, epilepsy, and high blood pressure should be treated and kept under control before pregnancy when possible, but especially during pregnancy. Ask your caregiver about any medications that may need to be changed or adjusted during pregnancy. If you are currently taking any medications, ask your caregiver if it  is safe to take them while you are pregnant or before getting pregnant when possible. Also, be sure to discuss any herbs or vitamins you are taking. They are medicines, too! Discuss with your doctor all medications, prescribed and over-the-counter, that you are taking. During your prenatal visit, discuss the medications your doctor may give you during labor and delivery. N Never be afraid to ask your doctor or caregiver questions about your health, the progress of the pregnancy, family problems, stressful situations, and recommendation for a pediatrician, if you do not have one. It is better to take all precautions and discuss any questions or concerns you may have during your office visits. It is a good idea to write down your questions before you visit the doctor. O Over-the-counter cough and cold remedies may contain alcohol or other ingredients that should be avoided during pregnancy. Ask your caregiver about prescription, herbs or over-the-counter medications that you are taking or may consider taking while pregnant.  P Physical activity during pregnancy can   benefit both you and your baby by lessening discomfort and fatigue, providing a sense of well-being, and increasing the likelihood of early recovery after delivery. Light to moderate exercise during pregnancy strengthens the belly (abdominal) and back muscles. This helps improve posture. Practicing yoga, walking, swimming, and cycling on a stationary bicycle are usually safe exercises for pregnant women. Avoid scuba diving, exercise at high altitudes (over 3000 feet), skiing, horseback riding, contact sports, etc. Always check with your doctor before beginning any kind of exercise, especially during pregnancy and especially if you did not exercise before getting pregnant. Q Queasiness, stomach upset and morning sickness are common during pregnancy. Eating a couple of crackers or dry toast before getting out of bed. Foods that you normally love may  make you feel sick to your stomach. You may need to substitute other nutritious foods. Eating 5 or 6 small meals a day instead of 3 large ones may make you feel better. Do not drink with your meals, drink between meals. Questions that you have should be written down and asked during your prenatal visits. R Read about and make plans to baby-proof your home. There are important tips for making your home a safer environment for your baby. Review the tips and make your home safer for you and your baby. Read food labels regarding calories, salt and fat content in the food. S Saunas, hot tubs, and steam rooms should be avoided while you are pregnant. Excessive high heat may be harmful during your pregnancy. Your caregiver will screen and examine you for sexually transmitted diseases and genetic disorders during your prenatal visits. Learn the signs of labor. Sexual relations while pregnant is safe unless there is a medical or pregnancy problem and your caregiver advises against it. T Traveling long distances should be avoided especially in the third trimester of your pregnancy. If you do have to travel out of state, be sure to take a copy of your medical records and medical insurance plan with you. You should not travel long distances without seeing your doctor first. Most airlines will not allow you to travel after 36 weeks of pregnancy. Toxoplasmosis is an infection caused by a parasite that can seriously harm an unborn baby. Avoid eating undercooked meat and handling cat litter. Be sure to wear gloves when gardening. Tingling of the hands and fingers is not unusual and is due to fluid retention. This will go away after the baby is born. U Womb (uterus) size increases during the first trimester. Your kidneys will begin to function more efficiently. This may cause you to feel the need to urinate more often. You may also leak urine when sneezing, coughing or laughing. This is due to the growing uterus pressing  against your bladder, which lies directly in front of and slightly under the uterus during the first few months of pregnancy. If you experience burning along with frequency of urination or bloody urine, be sure to tell your doctor. The size of your uterus in the third trimester may cause a problem with your balance. It is advisable to maintain good posture and avoid wearing high heels during this time. An ultrasound of your baby may be necessary during your pregnancy and is safe for you and your baby. V Vaccinations are an important concern for pregnant women. Get needed vaccines before pregnancy. Center for Disease Control (www.cdc.gov) has clear guidelines for the use of vaccines during pregnancy. Review the list, be sure to discuss it with your doctor. Prenatal vitamins are helpful   and healthy for you and the baby. Do not take extra vitamins except what is recommended. Taking too much of certain vitamins can cause overdose problems. Continuous vomiting should be reported to your caregiver. Varicose veins may appear especially if there is a family history of varicose veins. They should subside after the delivery of the baby. Support hose helps if there is leg discomfort. W Being overweight or underweight during pregnancy may cause problems. Try to get within 15 pounds of your ideal weight before pregnancy. Remember, pregnancy is not a time to be dieting! Do not stop eating or start skipping meals as your weight increases. Both you and your baby need the calories and nutrition you receive from a healthy diet. Be sure to consult with your doctor about your diet. There is a formula and diet plan available depending on whether you are overweight or underweight. Your caregiver or nutritionist can help and advise you if necessary. X Avoid X-rays. If you must have dental work or diagnostic tests, tell your dentist or physician that you are pregnant so that extra care can be taken. X-rays should only be taken when  the risks of not taking them outweigh the risk of taking them. If needed, only the minimum amount of radiation should be used. When X-rays are necessary, protective lead shields should be used to cover areas of the body that are not being X-rayed. Y Your baby loves you. Breastfeeding your baby creates a loving and very close bond between the two of you. Give your baby a healthy environment to live in while you are pregnant. Infants and children require constant care and guidance. Their health and safety should be carefully watched at all times. After the baby is born, rest or take a nap when the baby is sleeping. Z Get your ZZZs. Be sure to get plenty of rest. Resting on your side as often as possible, especially on your left side is advised. It provides the best circulation to your baby and helps reduce swelling. Try taking a nap for 30 to 45 minutes in the afternoon when possible. After the baby is born rest or take a nap when the baby is sleeping. Try elevating your feet for that amount of time when possible. It helps the circulation in your legs and helps reduce swelling.  Most information courtesy of the CDC. Document Released: 07/08/2005 Document Revised: 06/27/2011 Document Reviewed: 03/22/2009 [employer] Patient Information 2012 [employer]. 

## 2011-11-06 LAB — URINE CULTURE: Culture  Setup Time: 201304161845

## 2011-11-06 NOTE — ED Provider Notes (Signed)
Medical screening examination/treatment/procedure(s) were performed by non-physician practitioner and as supervising physician I was immediately available for consultation/collaboration.   Benny Lennert, MD 11/06/11 [phone]

## 2011-11-10 ENCOUNTER — Encounter (HOSPITAL_COMMUNITY): Payer: Self-pay

## 2011-11-10 ENCOUNTER — Emergency Department (HOSPITAL_COMMUNITY)
Admission: EM | Admit: 2011-11-10 | Discharge: 2011-11-10 | Disposition: A | Discharge status: Home / Self Care | Payer: Medicaid Other | Attending: Emergency Medicine | Admitting: Emergency Medicine

## 2011-11-10 DIAGNOSIS — L259 Unspecified contact dermatitis, unspecified cause: Secondary | ICD-10-CM | POA: Insufficient documentation

## 2011-11-10 DIAGNOSIS — M25473 Effusion, unspecified ankle: Secondary | ICD-10-CM | POA: Insufficient documentation

## 2011-11-10 DIAGNOSIS — Z79899 Other long term (current) drug therapy: Secondary | ICD-10-CM | POA: Insufficient documentation

## 2011-11-10 DIAGNOSIS — R109 Unspecified abdominal pain: Secondary | ICD-10-CM | POA: Insufficient documentation

## 2011-11-10 DIAGNOSIS — F3130 Bipolar disorder, current episode depressed, mild or moderate severity, unspecified: Secondary | ICD-10-CM | POA: Insufficient documentation

## 2011-11-10 DIAGNOSIS — M25476 Effusion, unspecified foot: Secondary | ICD-10-CM | POA: Insufficient documentation

## 2011-11-10 DIAGNOSIS — F411 Generalized anxiety disorder: Secondary | ICD-10-CM | POA: Insufficient documentation

## 2011-11-10 DIAGNOSIS — E669 Obesity, unspecified: Secondary | ICD-10-CM | POA: Insufficient documentation

## 2011-11-10 DIAGNOSIS — M7989 Other specified soft tissue disorders: Secondary | ICD-10-CM | POA: Insufficient documentation

## 2011-11-10 LAB — URINALYSIS, ROUTINE W REFLEX MICROSCOPIC
Bilirubin Urine: NEGATIVE
Glucose, UA: NEGATIVE mg/dL
Hgb urine dipstick: NEGATIVE
Ketones, ur: NEGATIVE mg/dL
pH: 6 (ref 5.0–8.0)

## 2011-11-10 MED ORDER — TRIAMCINOLONE ACETONIDE 0.1 % EX CREA: TOPICAL_CREAM | Freq: Two times a day (BID) | CUTANEOUS | Status: DC

## 2011-11-10 MED ORDER — HYDROCORTISONE 1 % EX CREA
TOPICAL_CREAM | Freq: Once | CUTANEOUS | Status: AC
  Administered 2011-11-10: 1 via TOPICAL
  Filled 2011-11-10: qty 1.5

## 2011-11-10 NOTE — ED Notes (Signed)
Pt has red, warm and painful area to the left lower quad of abd since Thursday. Pt states it has gotten progressively bigger since then.

## 2011-11-10 NOTE — Discharge Instructions (Signed)
Contact Dermatitis Contact dermatitis is a rash that happens when something touches the skin. You touched something that irritates your skin, or you have allergies to something you touched. HOME CARE   Avoid the thing that caused your rash.   Keep your rash away from hot water, soap, sunlight, chemicals, and other things that might bother it.   Do not scratch your rash.   You can take cool baths to help stop itching.   Only take medicine as told by your doctor.   Keep all doctor visits as told.  GET HELP RIGHT AWAY IF:   Your rash is not better after 3 days.   Your rash gets worse.   Your rash is puffy (swollen), tender, red, sore, or warm.   You have problems with your medicine.  MAKE SURE YOU:   Understand these instructions.   Will watch your condition.   Will get help right away if you are not doing well or get worse.  Document Released: 05/05/2009 Document Revised: 06/27/2011 Document Reviewed: 12/11/2010 [facility] Patient Information 2012 [place], [place].   Elevate legs. Apply ointment 2 times a day. Recheck with your primary care Dr. This week

## 2011-11-10 NOTE — ED Provider Notes (Signed)
History     CSN: [id number]  Arrival date & time 11/10/11  1239   First MD Initiated Contact with Patient 11/10/11 1547      Chief Complaint  Patient presents with  . Abdominal Cramping  . Leg Swelling   Chief complaint rash on lower extremity. (Consider location/radiation/quality/duration/timing/severity/associated sxs/prior treatment) HPI.Marland Kitchen Chief Complaint  Patient presents with  . Abdominal Cramping  . Leg Swelling  patient complains of rash on bilateral lower extremity for approximately 2 days. she stands at a [employer] all day.  wears socks up to her ankles.  Recent diagnosis of pregnancy. Slight ankle swelling. No fever, chills, dysuria, vaginal bleeding, vaginal discharge. No abdominal cramping and now  Past Medical History  Diagnosis Date  . Abnormal Pap smear   . Anxiety   . Depression   . Bipolar 1 disorder     Past Surgical History  Procedure Date  . Tooth extraction     No family history on file.  History  Substance Use Topics  . Smoking status: Former Smoker    Quit date: 05/21/2007  . Smokeless tobacco: Never Used  . Alcohol Use: No    OB History    Grav Para Term Preterm Abortions TAB SAB Ect Mult Living   2 1 1  0 1 0 1 0 0 1      Review of Systems  All other systems reviewed and are negative.    Allergies  Shellfish allergy  Home Medications   Current Outpatient Rx  Name Route Sig Dispense Refill  . ASPIRIN-SALICYLAMIDE-CAFFEINE 325-95-16 MG PO TABS Oral Take 1-2 packets by mouth as needed. FOR HEADACHES    . BECLOMETHASONE DIPROPIONATE 40 MCG/ACT IN AERS Inhalation Inhale 1-2 puffs into the lungs 2 (two) times daily.    . IBUPROFEN 200 MG PO TABS Oral Take 4 tablets (800 mg total) by mouth every 8 (eight) hours as needed for pain. 30 tablet 0  . RISPERIDONE 1 MG PO TABS Oral Take 1 mg by mouth 2 (two) times daily.    . TRIAMCINOLONE ACETONIDE 0.1 % EX CREA Topical Apply topically 2 (two) times daily. 60 g 0    BP  110/53  Pulse 94  Temp(Src) 97.8 F (36.6 C) (Oral)  Resp 24  Ht 5\' 4"  (1.626 m)  Wt 280 lb (127.007 kg)  BMI 48.06 kg/m2  SpO2 100%  LMP 09/06/2011  Physical Exam  Nursing note and vitals reviewed. Constitutional: She is oriented to person, place, and time. She appears well-developed and well-nourished.       obese  HENT:  Head: Normocephalic and atraumatic.  Eyes: Conjunctivae and EOM are normal. Pupils are equal, round, and reactive to light.  Neck: Normal range of motion. Neck supple.  Cardiovascular: Normal rate and regular rhythm.   Pulmonary/Chest: Effort normal and breath sounds normal.  Abdominal: Soft. Bowel sounds are normal.       nontender   Musculoskeletal: Normal range of motion.  Neurological: She is alert and oriented to person, place, and time.  Skin: Skin is warm and dry.       Bilateral ankles: Macular erythematous rash more consistent with contact dermatitis than cellulitis  Psychiatric: She has a normal mood and affect.    ED Course  Procedures (including critical care time)  Labs Reviewed  POCT PREGNANCY, URINE - Abnormal; Notable for the following:    Preg Test, Ur POSITIVE (*)    All other components within normal limits  GLUCOSE, CAPILLARY - Abnormal;  Notable for the following:    Glucose-Capillary 101 (*)    All other components within normal limits  URINALYSIS, ROUTINE W REFLEX MICROSCOPIC   No results found.   1. Contact dermatitis       MDM  History and physical more consistent with contact dermatitis and cellulitis. Will Rx for Aristocort ointment. Patient has followup appointment with OB/GYN this week. We'll recheck at that time        Donnetta Hutching, MD 11/10/11 1933

## 2011-11-10 NOTE — ED Notes (Signed)
1.Pt having ab cramping for 2 days, was told last week by er that she was pregnant, denies any vaginal discharge. 2. bil leg swelling for 2 days.

## 2011-11-28 ENCOUNTER — Other Ambulatory Visit: Payer: Self-pay | Admitting: Adult Health

## 2011-11-28 ENCOUNTER — Other Ambulatory Visit (HOSPITAL_COMMUNITY)
Admission: RE | Admit: 2011-11-28 | Discharge: 2011-11-28 | Disposition: A | Discharge status: Home / Self Care | Payer: Medicaid Other | Source: Ambulatory Visit | Attending: Obstetrics and Gynecology | Admitting: Obstetrics and Gynecology

## 2011-11-28 DIAGNOSIS — Z01419 Encounter for gynecological examination (general) (routine) without abnormal findings: Secondary | ICD-10-CM | POA: Insufficient documentation

## 2011-11-28 DIAGNOSIS — Z113 Encounter for screening for infections with a predominantly sexual mode of transmission: Secondary | ICD-10-CM | POA: Insufficient documentation

## 2011-12-01 IMAGING — CT CT HEAD W/O CM
1 series · 16 of 30 positions shown, 20 images · non-contrast
Comparison: Head CT scan 09/18/2010.

CLINICAL DATA: Dizziness and headache.

CT HEAD WITHOUT CONTRAST
TECHNIQUE: Contiguous axial images were obtained from the base of
the skull through the vertex without contrast.

[Series 2: headseq 4.8 h37s · axial · 0.47mm/px · z∈[+78,+235]mm · 16 of 36 slices shown, 20 images]
[im 2/36  brain]
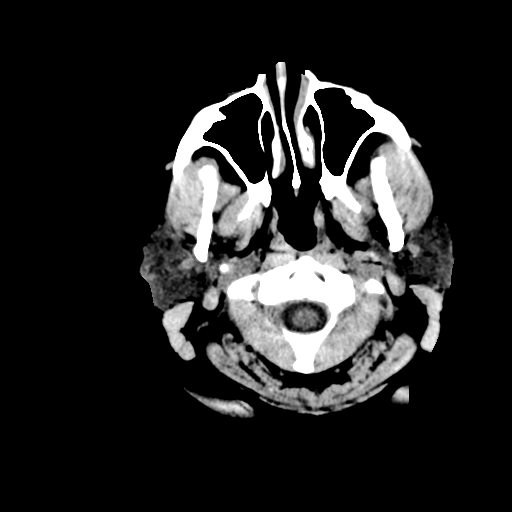
[im 2/36  bone]
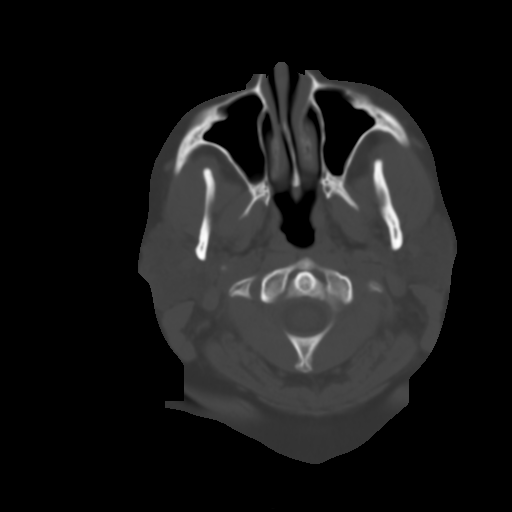
[im 4/36  brain]
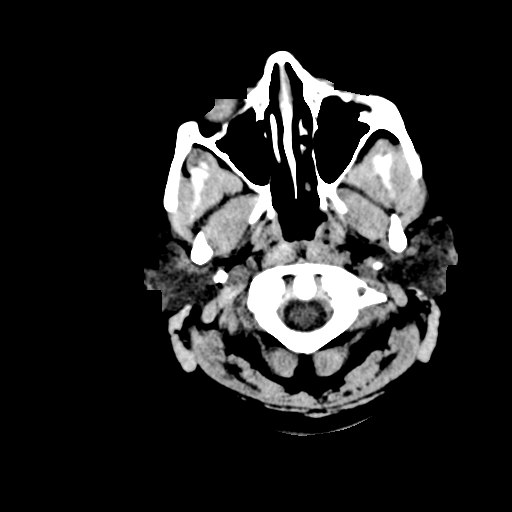
[im 7/36  brain]
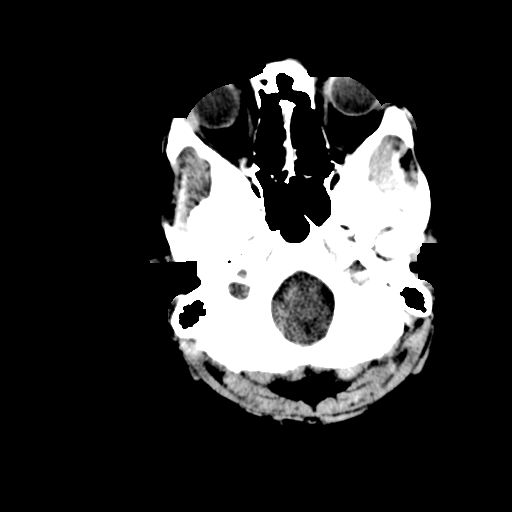
[im 9/36  brain]
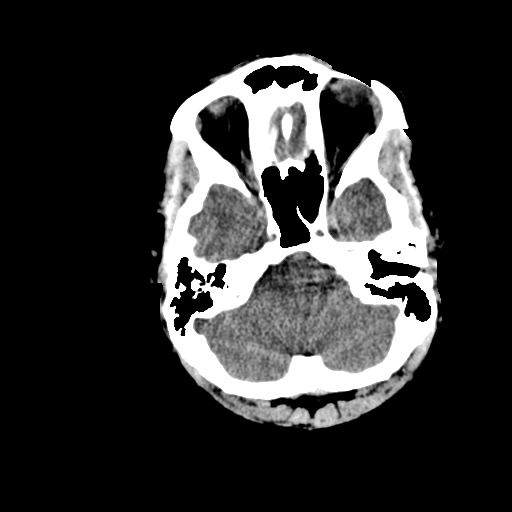
[im 10/36  brain]
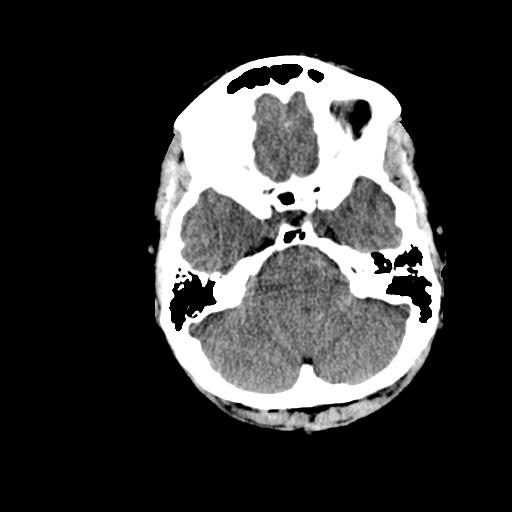
[im 10/36  bone]
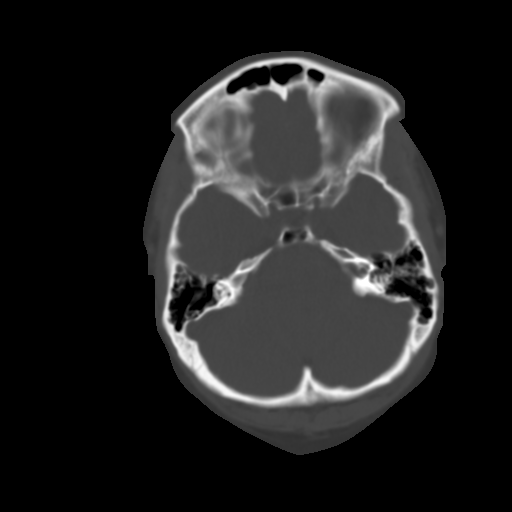
[im 13/36  brain]
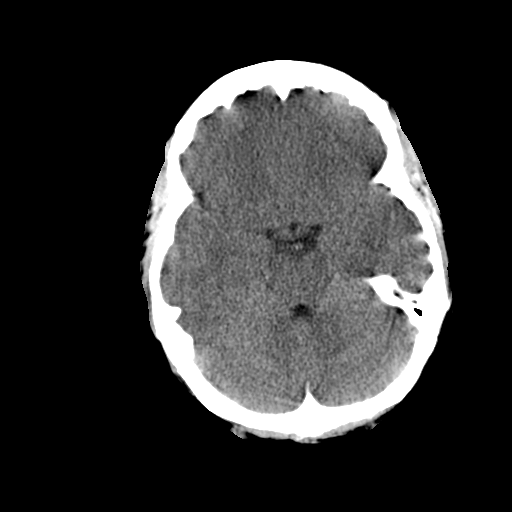
[im 15/36  brain]
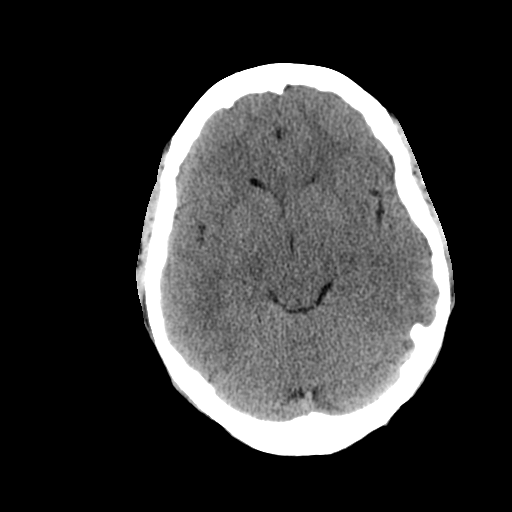
[im 17/36  brain]
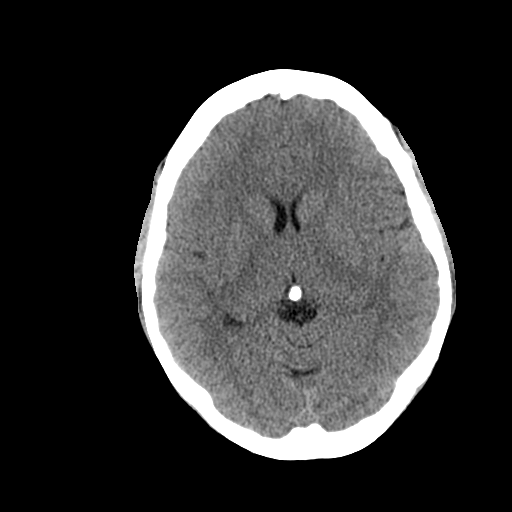
[im 19/36  brain]
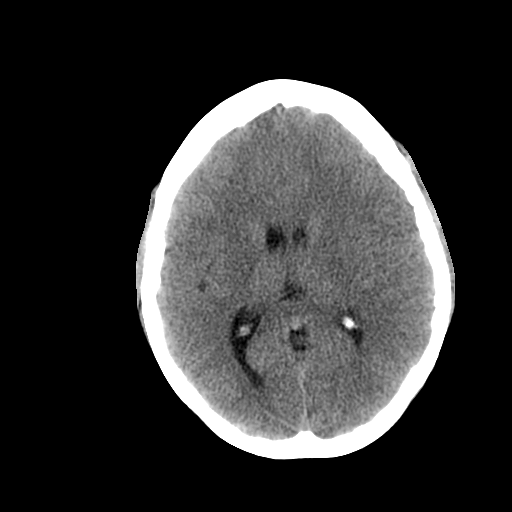
[im 19/36  bone]
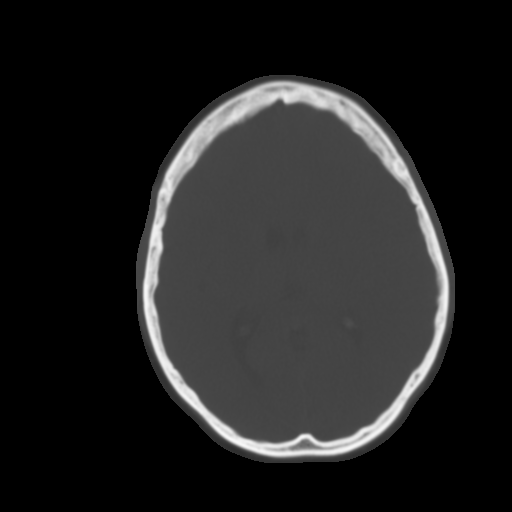
[im 21/36  brain]
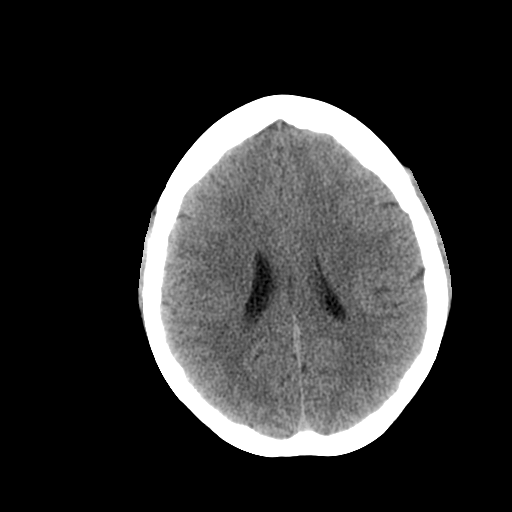
[im 23/36  brain]
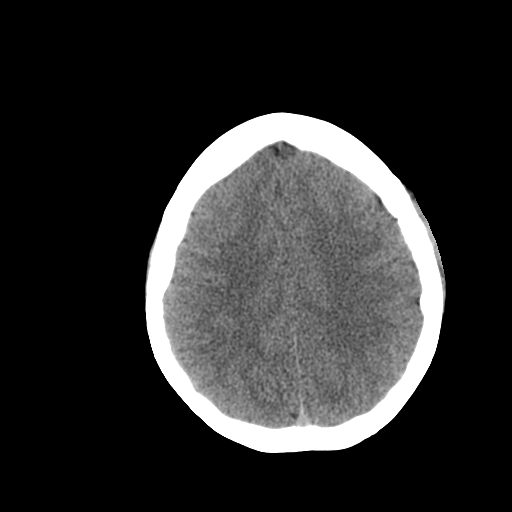
[im 26/36  brain]
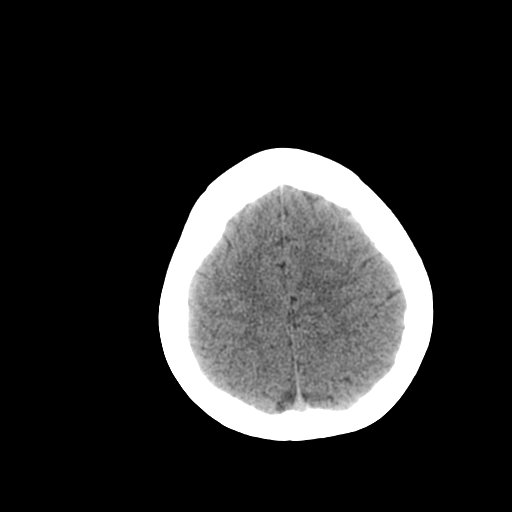
[im 27/36  brain]
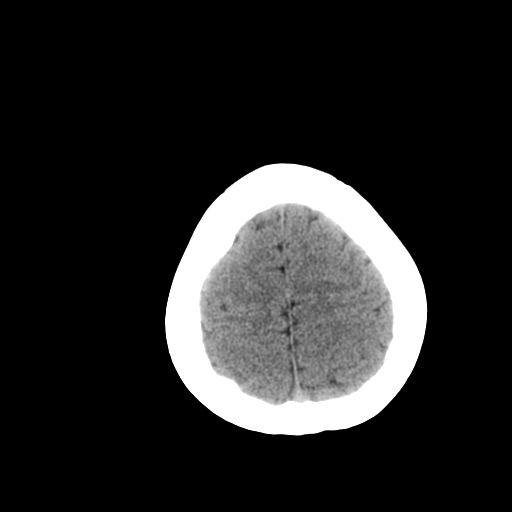
[im 27/36  bone]
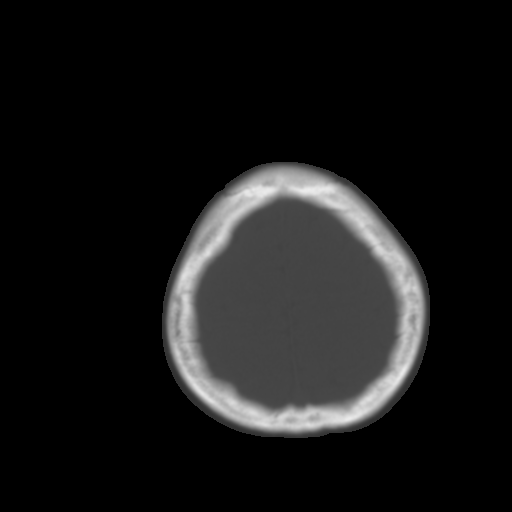
[im 29/36  brain]
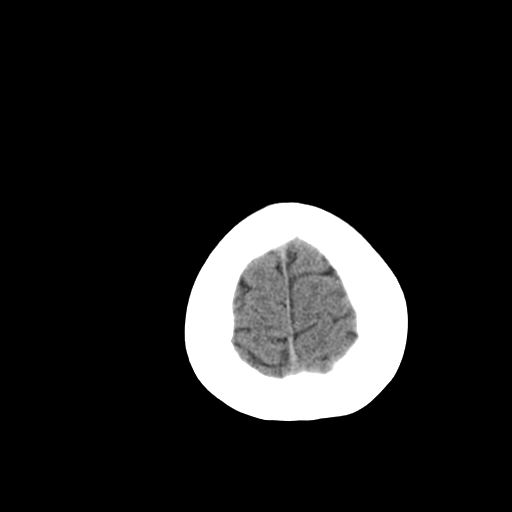
[im 32/36  brain]
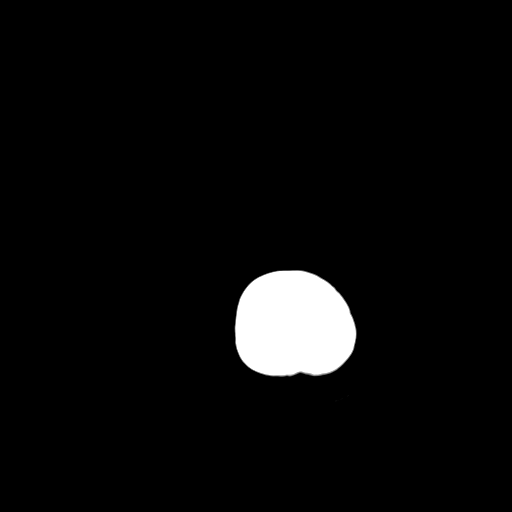
[im 34/36  brain]
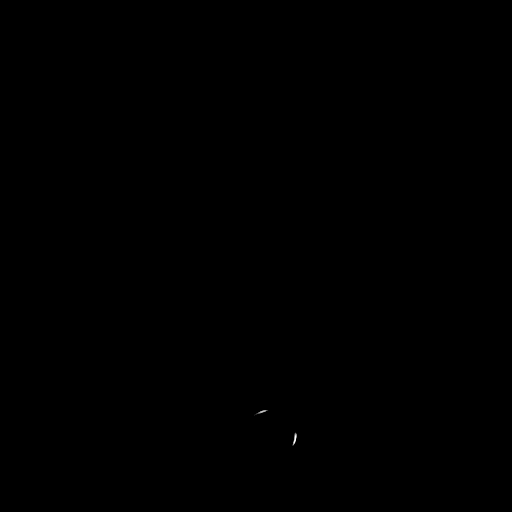

[16 of 30 positions shown; findings below may reference images not displayed]

FINDINGS: The brain appears normal without evidence of acute
infarction, hemorrhage, mass lesion, mass effect, midline shift or
abnormal extra-axial fluid collection.  There is no hydrocephalus.
The calvarium is intact.  Imaged paranasal sinuses and mastoid air
cells are clear.
IMPRESSION: Normal exam.

## 2011-12-05 ENCOUNTER — Encounter (HOSPITAL_COMMUNITY): Payer: Self-pay | Admitting: *Deleted

## 2011-12-05 ENCOUNTER — Emergency Department (HOSPITAL_COMMUNITY)
Admission: EM | Admit: 2011-12-05 | Discharge: 2011-12-05 | Disposition: A | Discharge status: Home / Self Care | Payer: Medicaid Other | Attending: Emergency Medicine | Admitting: Emergency Medicine

## 2011-12-05 DIAGNOSIS — N390 Urinary tract infection, site not specified: Secondary | ICD-10-CM | POA: Insufficient documentation

## 2011-12-05 DIAGNOSIS — R350 Frequency of micturition: Secondary | ICD-10-CM | POA: Insufficient documentation

## 2011-12-05 DIAGNOSIS — R300 Dysuria: Secondary | ICD-10-CM | POA: Insufficient documentation

## 2011-12-05 DIAGNOSIS — Z87891 Personal history of nicotine dependence: Secondary | ICD-10-CM | POA: Insufficient documentation

## 2011-12-05 HISTORY — DX: Renal tubulo-interstitial disease, unspecified: N15.9

## 2011-12-05 HISTORY — DX: Urinary tract infection, site not specified: N39.0

## 2011-12-05 LAB — URINALYSIS, ROUTINE W REFLEX MICROSCOPIC
Glucose, UA: NEGATIVE mg/dL
pH: 6 (ref 5.0–8.0)

## 2011-12-05 LAB — URINE MICROSCOPIC-ADD ON

## 2011-12-05 MED ORDER — NITROFURANTOIN MONOHYD MACRO 100 MG PO CAPS: 100.0000 mg | ORAL_CAPSULE | Freq: Two times a day (BID) | ORAL | Status: AC

## 2011-12-05 NOTE — Discharge Instructions (Signed)

## 2011-12-05 NOTE — ED Provider Notes (Signed)
Medical screening examination/treatment/procedure(s) were performed by non-physician practitioner and as supervising physician I was immediately available for consultation/collaboration.  Donnetta Hutching, MD 12/05/11 1626

## 2011-12-05 NOTE — ED Provider Notes (Signed)
History     CSN: [id number]  Arrival date & time 12/05/11  1436   First MD Initiated Contact with Patient 12/05/11 1510      Chief Complaint  Patient presents with  . Urinary Tract Infection    (Consider location/radiation/quality/duration/timing/severity/associated sxs/prior treatment) HPI Comments: Patient presents for evaluation of increased urinary frequency and burning with urination which started this morning.  She is approximately [redacted] weeks pregnant and hasn't established care with Dr. Emelda Fear for this pregnancy.  She was seen last week in his office and was diagnosed with bacterial vaginosis but just had her metronidazole filled yesterday, has had 2 doses so far.  She denies fevers or chills, no low back pain or flank pain.  She denies hematuria.  She has had nausea with emesis, but not worsened since this morning's symptoms began, and she blames this on her pregnancy area she is using Zofran when necessary for the nausea which has been helpful.  Patient is a 26 y.o. female presenting with urinary tract infection.  Urinary Tract Infection This is a new problem. The current episode started today. Pertinent negatives include no abdominal pain, arthralgias, chest pain, congestion, fever, headaches, joint swelling, nausea, neck pain, numbness, rash, sore throat or weakness.    Past Medical History  Diagnosis Date  . Abnormal Pap smear   . Anxiety   . Depression   . Bipolar 1 disorder   . Kidney infection   . UTI (urinary tract infection)     Past Surgical History  Procedure Date  . Tooth extraction     No family history on file.  History  Substance Use Topics  . Smoking status: Former Smoker    Quit date: 05/21/2007  . Smokeless tobacco: Never Used  . Alcohol Use: No    OB History    Grav Para Term Preterm Abortions TAB SAB Ect Mult Living   3 1 1  0 1 0 1 0 0 1      Review of Systems  Constitutional: Negative for fever.  HENT: Negative for congestion, sore  throat and neck pain.   Eyes: Negative.   Respiratory: Negative for chest tightness and shortness of breath.   Cardiovascular: Negative for chest pain.  Gastrointestinal: Negative for nausea and abdominal pain.  Genitourinary: Positive for dysuria and frequency. Negative for vaginal bleeding and vaginal discharge.  Musculoskeletal: Negative for joint swelling and arthralgias.  Skin: Negative.  Negative for rash and wound.  Neurological: Negative for dizziness, weakness, light-headedness, numbness and headaches.  Hematological: Negative.   Psychiatric/Behavioral: Negative.     Allergies  Shellfish allergy  Home Medications   Current Outpatient Rx  Name Route Sig Dispense Refill  . METRONIDAZOLE 250 MG PO TABS Oral Take 250 mg by mouth 3 (three) times daily.    Marland Kitchen ONDANSETRON HCL 8 MG PO TABS Oral Take by mouth every 8 (eight) hours as needed.    . ASPIRIN-SALICYLAMIDE-CAFFEINE 325-95-16 MG PO TABS Oral Take 1-2 packets by mouth as needed. FOR HEADACHES    . BECLOMETHASONE DIPROPIONATE 40 MCG/ACT IN AERS Inhalation Inhale 1-2 puffs into the lungs 2 (two) times daily.    . IBUPROFEN 200 MG PO TABS Oral Take 4 tablets (800 mg total) by mouth every 8 (eight) hours as needed for pain. 30 tablet 0  . NITROFURANTOIN MONOHYD MACRO 100 MG PO CAPS Oral Take 1 capsule (100 mg total) by mouth 2 (two) times daily. 14 capsule 0  . RISPERIDONE 1 MG PO TABS Oral Take 1  mg by mouth 2 (two) times daily.    . TRIAMCINOLONE ACETONIDE 0.1 % EX CREA Topical Apply topically 2 (two) times daily. 60 g 0    BP 121/65  Pulse 93  Temp(Src) 98.5 F (36.9 C) (Oral)  Resp 18  Ht 5\' 6"  (1.676 m)  Wt 285 lb (129.275 kg)  BMI 46.00 kg/m2  SpO2 97%  LMP 03/06/2011  Physical Exam  Nursing note and vitals reviewed. Constitutional: She appears well-developed and well-nourished.  HENT:  Head: Normocephalic and atraumatic.  Eyes: Conjunctivae are normal.  Neck: Normal range of motion.  Cardiovascular: Normal  rate, regular rhythm, normal heart sounds and intact distal pulses.   Pulmonary/Chest: Effort normal and breath sounds normal. She has no wheezes.  Abdominal: Soft. Bowel sounds are normal. There is no tenderness. There is no rebound and no guarding.       No CVA tenderness.  Musculoskeletal: Normal range of motion.  Neurological: She is alert.  Skin: Skin is warm and dry.  Psychiatric: She has a normal mood and affect.    ED Course  Procedures (including critical care time)  Labs Reviewed  URINALYSIS, ROUTINE W REFLEX MICROSCOPIC - Abnormal; Notable for the following:    Specific Gravity, Urine >1.030 (*)    Hgb urine dipstick MODERATE (*)    Protein, ur TRACE (*)    Leukocytes, UA TRACE (*)    All other components within normal limits  URINE MICROSCOPIC-ADD ON - Abnormal; Notable for the following:    Squamous Epithelial / LPF FEW (*)    Bacteria, UA FEW (*)    All other components within normal limits  URINE CULTURE   No results found.   1. UTI (lower urinary tract infection)       MDM  Patient was prescribed Macrobid twice a day x7 days.  Encouraged to increase fluid intake.  Followup with Dr. Emelda Fear the symptoms are not improved over the next several days, or if symptoms worsen, specifically fevers or chills, uncontrolled vomiting.        Burgess Amor, [place] 12/05/11 1538

## 2011-12-05 NOTE — ED Notes (Signed)
Burning with urinary frequency that started this morning.  Started abx for bacterial vaginosis yesterday.  Denies any other sx.

## 2011-12-07 LAB — URINE CULTURE: Colony Count: >=100000

## 2011-12-27 ENCOUNTER — Emergency Department (HOSPITAL_COMMUNITY)
Admission: EM | Admit: 2011-12-27 | Discharge: 2011-12-27 | Disposition: A | Discharge status: Home / Self Care | Payer: Medicaid Other | Attending: Emergency Medicine | Admitting: Emergency Medicine

## 2011-12-27 ENCOUNTER — Encounter (HOSPITAL_COMMUNITY): Payer: Self-pay | Admitting: *Deleted

## 2011-12-27 DIAGNOSIS — Z3490 Encounter for supervision of normal pregnancy, unspecified, unspecified trimester: Secondary | ICD-10-CM

## 2011-12-27 DIAGNOSIS — N390 Urinary tract infection, site not specified: Secondary | ICD-10-CM | POA: Insufficient documentation

## 2011-12-27 DIAGNOSIS — O9989 Other specified diseases and conditions complicating pregnancy, childbirth and the puerperium: Secondary | ICD-10-CM | POA: Insufficient documentation

## 2011-12-27 DIAGNOSIS — O2390 Unspecified genitourinary tract infection in pregnancy, unspecified trimester: Secondary | ICD-10-CM | POA: Insufficient documentation

## 2011-12-27 HISTORY — DX: Encounter for supervision of normal pregnancy, unspecified, unspecified trimester: Z34.90

## 2011-12-27 LAB — URINALYSIS, ROUTINE W REFLEX MICROSCOPIC
Glucose, UA: NEGATIVE mg/dL
Nitrite: NEGATIVE
Protein, ur: NEGATIVE mg/dL
Urobilinogen, UA: 0.2 mg/dL (ref 0.0–1.0)

## 2011-12-27 LAB — WET PREP, GENITAL

## 2011-12-27 LAB — URINE MICROSCOPIC-ADD ON

## 2011-12-27 MED ORDER — NITROFURANTOIN MONOHYD MACRO 100 MG PO CAPS: 100.0000 mg | ORAL_CAPSULE | Freq: Two times a day (BID) | ORAL | Status: AC

## 2011-12-27 MED ORDER — ABCIXIMAB 2 MG/ML IV SOLN
INTRAVENOUS | Status: AC
  Filled 2011-12-27: qty 5

## 2011-12-27 NOTE — ED Notes (Signed)
Pt c/o vaginal pressure, frequency with urination, burning with urination, pt states that she is [redacted] weeks pregnant, due date is 07/11/2012

## 2011-12-27 NOTE — ED Notes (Signed)
Pt reports for the past 2 days has felt pressure in vagina and feels like has to void all of the time.  Also reports vomiting x 1 week and diarrhea x 1 1/2 weeks.  Pt says her OBGYN says the vomiting is from acid reflux.  Pt reports has been eating and drinking well despite n/v/d.  Last vomited last night and last episode of diarrhea is this am.  Denies abd pain, denies abnormal vaginal bleeding or discharge.

## 2011-12-27 NOTE — ED Provider Notes (Addendum)
This chart was scribed for Ward Givens, MD by Wallis Mart. The patient was seen in room [facility] and the patient's care was started at 2:00 PM.   CSN: [id number]  Arrival date & time 12/27/11  1235   First MD Initiated Contact with Patient 12/27/11 1329      Chief Complaint  Patient presents with  . Vaginal Pain  . Urinary Frequency    (Consider location/radiation/quality/duration/timing/severity/associated sxs/prior treatment) HPI  [name] [name] is a 26 y.o. female who presents to the Emergency Department complaining of sudden onset, persistence of constat, gradually worsening, moderate to severe urinary frequency, dribbling and dysuria onset 2-3 days ago. Pt c/o vomiting and nausea since she has been pregnant, and  vaginal pressure.  Denies vaginal bleeding or discharge.  Pt also c/o assoicated right sided abdominal pain that shoots across her abdomen onset a week ago.  Pt is [redacted] weeks pregnant, due date is Dec 21. Pt has had some nausea and heart burn w/ the pregnancy. Pt was seen by her prenatal doctor yesterday and prescrived prilosec. She did not have a pelvic done.  She reports her UA was normal.  This is the patients 3rd pregnancy. She has one child and had a miscarriage at 8 weeks. [id number].Pt's last period was in March. Pt denies smoking  Pt was seen by Dr Emelda Fear yesterday and told her UA was normal. She was started on prilosec which she hasn't filled yet for GERD (burning heartburn).   Prenatal care: Dr Emelda Fear PCP: [facility] [place] [facility]  Past Medical History  Diagnosis Date  . Abnormal Pap smear   . Anxiety   . Depression   . Bipolar 1 disorder   . Kidney infection   . UTI (urinary tract infection)   . Pregnancy     Past Surgical History  Procedure Date  . Tooth extraction     No family history on file.  History  Substance Use Topics  . Smoking status: Former Smoker    Quit date: 05/21/2007  . Smokeless tobacco: Never Used  . Alcohol Use:  No  employed  OB History    Grav Para Term Preterm Abortions TAB SAB Ect Mult Living   3 1 1  0 1 0 1 0 0 1      Review of Systems  10 Systems reviewed and all are negative for acute change except as noted in the HPI.    Allergies  Shellfish allergy  Home Medications   Current Outpatient Rx  Name Route Sig Dispense Refill  . FLINTSTONES COMPLETE 60 MG PO CHEW Oral Chew 1 tablet by mouth daily.    Marland Kitchen FOLIC ACID 1 MG PO TABS Oral Take 1 mg by mouth daily.      BP 117/69  Pulse 87  Temp(Src) 98.3 F (36.8 C) (Oral)  Resp 20  Ht 5\' 5"  (1.651 m)  Wt 290 lb (131.543 kg)  BMI 48.26 kg/m2  SpO2 100%  LMP 03/06/2011  Vital signs normal    Physical Exam  Nursing note and vitals reviewed. Constitutional: She is oriented to person, place, and time. She appears well-developed and well-nourished. No distress.       Obese   HENT:  Head: Normocephalic and atraumatic.  Right Ear: External ear normal.  Nose: Nose normal.  Mouth/Throat: Oropharynx is clear and moist.  Eyes: Conjunctivae and EOM are normal. Pupils are equal, round, and reactive to light.  Neck: Normal range of motion. Neck supple. No tracheal deviation present.  Cardiovascular:  Normal rate, regular rhythm and normal heart sounds.   Pulmonary/Chest: Effort normal and breath sounds normal. No respiratory distress. She has no wheezes. She has no rales.  Abdominal: Soft. She exhibits no distension. There is no tenderness. There is no rebound and no guarding.  Genitourinary: No vaginal discharge found.       Normal external genitalia, minimal white vaginal discharge, adnexa nontender bilaterally without masses, uterus hard to palpate because of the thickness of the abdominal wall.  Musculoskeletal: Normal range of motion. She exhibits no edema.  Neurological: She is alert and oriented to person, place, and time. No sensory deficit.  Skin: Skin is warm and dry.  Psychiatric: She has a normal mood and affect. Her  behavior is normal.    ED Course  Procedures (including critical care time) DIAGNOSTIC STUDIES: Oxygen Saturation is 100% on room air, normal by my interpretation.    Bedside US done, one intrauterine fetus visualized with good heart activity, this machine does not allow for measurements to be done. Two views archived.  Nurse reports FHR 158    COORDINATION OF CARE:   Results for orders placed during the hospital encounter of 12/27/11  URINALYSIS, ROUTINE W REFLEX MICROSCOPIC      Component Value Range   Color, Urine YELLOW  YELLOW    APPearance CLOUDY (*) CLEAR    Specific Gravity, Urine >1.030 (*) 1.005 - 1.030    pH 5.5  5.0 - 8.0    Glucose, UA NEGATIVE  NEGATIVE (mg/dL)   Hgb urine dipstick MODERATE (*) NEGATIVE    Bilirubin Urine NEGATIVE  NEGATIVE    Ketones, ur NEGATIVE  NEGATIVE (mg/dL)   Protein, ur NEGATIVE  NEGATIVE (mg/dL)   Urobilinogen, UA 0.2  0.0 - 1.0 (mg/dL)   Nitrite NEGATIVE  NEGATIVE    Leukocytes, UA SMALL (*) NEGATIVE   URINE MICROSCOPIC-ADD ON      Component Value Range   Squamous Epithelial / LPF RARE  RARE    WBC, UA 3-6  <3 (WBC/hpf)   RBC / HPF 3-6  <3 (RBC/hpf)   Bacteria, UA FEW (*) RARE   WET PREP, GENITAL      Component Value Range   Yeast Wet Prep HPF POC NONE SEEN  NONE SEEN    Trich, Wet Prep NONE SEEN  NONE SEEN    Clue Cells Wet Prep HPF POC NONE SEEN  NONE SEEN    WBC, Wet Prep HPF POC FEW (*) NONE SEEN    Laboratory interpretation all normal except possible mild UTI     1. Urinary tract infection   2. Intrauterine pregnancy     New Prescriptions   NITROFURANTOIN, MACROCRYSTAL-MONOHYDRATE, (MACROBID) 100 MG CAPSULE    Take 1 capsule (100 mg total) by mouth 2 (two) times daily.   Plan discharge   Devoria Albe, MD, FACEP   MDM   I personally performed the services described in this documentation, which was scribed in my presence. The recorded information has been reviewed and considered. Devoria Albe, MD, FACEP   Ward Givens, MD 12/27/11 1622  Ward Givens, MD 12/27/11 [phone]

## 2011-12-27 NOTE — Discharge Instructions (Signed)
Drink plenty of fluids. Take the antibiotic until gone. Recheck if you get a fever, vomiting, vaginal spotting, pelvic pain or not improving over the weekend.

## 2012-02-06 ENCOUNTER — Emergency Department (HOSPITAL_COMMUNITY)
Admission: EM | Admit: 2012-02-06 | Discharge: 2012-02-06 | Disposition: A | Discharge status: Home / Self Care | Payer: Medicaid Other | Attending: Emergency Medicine | Admitting: Emergency Medicine

## 2012-02-06 ENCOUNTER — Encounter (HOSPITAL_COMMUNITY): Payer: Self-pay | Admitting: *Deleted

## 2012-02-06 DIAGNOSIS — Z87891 Personal history of nicotine dependence: Secondary | ICD-10-CM | POA: Insufficient documentation

## 2012-02-06 DIAGNOSIS — N390 Urinary tract infection, site not specified: Secondary | ICD-10-CM | POA: Insufficient documentation

## 2012-02-06 DIAGNOSIS — O2390 Unspecified genitourinary tract infection in pregnancy, unspecified trimester: Secondary | ICD-10-CM | POA: Insufficient documentation

## 2012-02-06 DIAGNOSIS — F319 Bipolar disorder, unspecified: Secondary | ICD-10-CM | POA: Insufficient documentation

## 2012-02-06 DIAGNOSIS — O99340 Other mental disorders complicating pregnancy, unspecified trimester: Secondary | ICD-10-CM | POA: Insufficient documentation

## 2012-02-06 LAB — ABO/RH: ABO/RH(D): O NEG

## 2012-02-06 LAB — URINALYSIS, ROUTINE W REFLEX MICROSCOPIC
Nitrite: NEGATIVE
Specific Gravity, Urine: >1.03 — ABNORMAL HIGH (ref 1.005–1.030)
pH: 6 (ref 5.0–8.0)

## 2012-02-06 LAB — BASIC METABOLIC PANEL
BUN: 8 mg/dL (ref 6–23)
Chloride: 103 mEq/L (ref 96–112)
GFR calc Af Amer: >90 mL/min (ref >90)
Glucose, Bld: 79 mg/dL (ref 70–99)
Potassium: 4.1 mEq/L (ref 3.5–5.1)
Sodium: 137 mEq/L (ref 135–145)

## 2012-02-06 LAB — CBC WITH DIFFERENTIAL/PLATELET
Hemoglobin: 13.1 g/dL (ref 12.0–15.0)
Lymphs Abs: 2.3 10*3/uL (ref 0.7–4.0)
Monocytes Relative: 5 % (ref 3–12)
Neutro Abs: 9.4 10*3/uL — ABNORMAL HIGH (ref 1.7–7.7)
Neutrophils Relative %: 75 % (ref 43–77)
RBC: 5.05 MIL/uL (ref 3.87–5.11)
WBC: 12.5 10*3/uL — ABNORMAL HIGH (ref 4.0–10.5)

## 2012-02-06 LAB — URINE MICROSCOPIC-ADD ON

## 2012-02-06 MED ORDER — MORPHINE SULFATE 4 MG/ML IJ SOLN
4.0000 mg | Freq: Once | INTRAMUSCULAR | Status: AC
  Administered 2012-02-06: 4 mg via INTRAVENOUS
  Filled 2012-02-06: qty 1

## 2012-02-06 MED ORDER — CEPHALEXIN 500 MG PO CAPS: 500.0000 mg | ORAL_CAPSULE | Freq: Four times a day (QID) | ORAL | Status: AC

## 2012-02-06 MED ORDER — DEXTROSE 5 % IV SOLN
1.0000 g | Freq: Once | INTRAVENOUS | Status: AC
  Administered 2012-02-06: 1 g via INTRAVENOUS
  Filled 2012-02-06: qty 10

## 2012-02-06 MED ORDER — HYDROCODONE-ACETAMINOPHEN 5-325 MG PO TABS: ORAL_TABLET | ORAL | Status: DC

## 2012-02-06 MED ORDER — CEFTRIAXONE SODIUM 1 G IJ SOLR
1.0000 g | Freq: Once | INTRAMUSCULAR | Status: DC
  Filled 2012-02-06: qty 10

## 2012-02-06 MED ORDER — SODIUM CHLORIDE 0.9 % IV SOLN
Freq: Once | INTRAVENOUS | Status: AC
  Administered 2012-02-06: 14:00:00 via INTRAVENOUS

## 2012-02-06 NOTE — ED Provider Notes (Signed)
History     CSN: [id number]  Arrival date & time 02/06/12  1316   First MD Initiated Contact with Patient 02/06/12 1348      Chief Complaint  Patient presents with  . Abdominal Pain    (Consider location/radiation/quality/duration/timing/severity/associated sxs/prior treatment) Patient is a 26 y.o. female presenting with abdominal pain. The history is provided by the patient (the pt complains of dysuria and lowe abd pain). No language interpreter was used.  Abdominal Pain The primary symptoms of the illness include abdominal pain and dysuria. The primary symptoms of the illness do not include fatigue or diarrhea. The current episode started 13 to 24 hours ago. The onset of the illness was gradual. The problem has not changed since onset. The dysuria is not associated with hematuria or frequency.  Associated with: hurts with urination. The patient states that she believes she is currently pregnant. The patient has not had a change in bowel habit. Symptoms associated with the illness do not include chills, anorexia, hematuria, frequency or back pain. Significant associated medical issues do not include PUD.    Past Medical History  Diagnosis Date  . Abnormal Pap smear   . Anxiety   . Depression   . Bipolar 1 disorder   . UTI (urinary tract infection)   . Pregnancy   . Kidney infection     Past Surgical History  Procedure Date  . Tooth extraction     History reviewed. No pertinent family history.  History  Substance Use Topics  . Smoking status: Former Smoker    Quit date: 05/21/2007  . Smokeless tobacco: Never Used  . Alcohol Use: No    OB History    Grav Para Term Preterm Abortions TAB SAB Ect Mult Living   3 1 1  0 1 0 1 0 0 1      Review of Systems  Constitutional: Negative for chills and fatigue.  HENT: Negative for congestion, sinus pressure and ear discharge.   Eyes: Negative for discharge.  Respiratory: Negative for cough.   Cardiovascular: Negative for  chest pain.  Gastrointestinal: Positive for abdominal pain. Negative for diarrhea and anorexia.  Genitourinary: Positive for dysuria. Negative for frequency and hematuria.  Musculoskeletal: Negative for back pain.  Skin: Negative for rash.  Neurological: Negative for seizures and headaches.  Hematological: Negative.   Psychiatric/Behavioral: Negative for hallucinations.    Allergies  Shellfish allergy  Home Medications   Current Outpatient Rx  Name Route Sig Dispense Refill  . FLINTSTONES COMPLETE 60 MG PO CHEW Oral Chew 1 tablet by mouth daily.    Marland Kitchen FOLIC ACID 400 MCG PO TABS Oral Take 400 mcg by mouth every morning.    Marland Kitchen OMEPRAZOLE 20 MG PO CPDR Oral Take 20 mg by mouth every morning.      BP 105/59  Pulse 92  Temp 97.5 F (36.4 C) (Oral)  Resp 22  Ht 5\' 7"  (1.702 m)  Wt 280 lb (127.007 kg)  BMI 43.85 kg/m2  SpO2 98%  LMP 03/06/2011  Physical Exam  Constitutional: She is oriented to person, place, and time. She appears well-developed.  HENT:  Head: Normocephalic and atraumatic.  Eyes: Conjunctivae and EOM are normal. No scleral icterus.  Neck: Neck supple. No thyromegaly present.  Cardiovascular: Normal rate and regular rhythm.  Exam reveals no gallop and no friction rub.   No murmur heard. Pulmonary/Chest: No stridor. She has no wheezes. She has no rales. She exhibits no tenderness.  Abdominal:  Pt has a uterus consitent with 18 weeks pregnancy.  Mild suprapubic tenderness  Genitourinary:       Vaginal exam normal.  No blood cervix closed and no blood seen in vault.  Cervix not tender.    Musculoskeletal: Normal range of motion. She exhibits no edema.  Lymphadenopathy:    She has no cervical adenopathy.  Neurological: She is oriented to person, place, and time. Coordination normal.  Skin: No rash noted. No erythema.  Psychiatric: She has a normal mood and affect. Her behavior is normal.    ED Course  Procedures (including critical care time)  Labs  Reviewed  URINALYSIS, ROUTINE W REFLEX MICROSCOPIC - Abnormal; Notable for the following:    Color, Urine BROWN (*)  BIOCHEMICALS MAY BE AFFECTED BY COLOR   APPearance CLOUDY (*)     Specific Gravity, Urine >1.030 (*)     Hgb urine dipstick LARGE (*)     Bilirubin Urine SMALL (*)     Protein, ur >300 (*)     Leukocytes, UA MODERATE (*)     All other components within normal limits  CBC WITH DIFFERENTIAL - Abnormal; Notable for the following:    WBC 12.5 (*)     MCH 25.9 (*)     RDW 15.9 (*)     Neutro Abs 9.4 (*)     All other components within normal limits  URINE MICROSCOPIC-ADD ON - Abnormal; Notable for the following:    Squamous Epithelial / LPF FEW (*)     Bacteria, UA MANY (*)     All other components within normal limits  BASIC METABOLIC PANEL  ABO/RH  URINE CULTURE   No results found.   No diagnosis found.   I spoke with dr. Emelda Fear and he agrees with tx of keflex.  He will follow up monday MDM  uti        Benny Lennert, MD 02/06/12 [phone]

## 2012-02-06 NOTE — ED Notes (Signed)
Low abd pain , dysuria, N/V/D,  Pt is pregnant.  Vag "spotting" for 2 days.

## 2012-02-08 LAB — URINE CULTURE: Colony Count: >=100000

## 2012-03-16 ENCOUNTER — Emergency Department (HOSPITAL_COMMUNITY)
Admission: EM | Admit: 2012-03-16 | Discharge: 2012-03-16 | Disposition: A | Discharge status: Home / Self Care | Payer: Medicaid Other | Attending: Emergency Medicine | Admitting: Emergency Medicine

## 2012-03-16 ENCOUNTER — Emergency Department (HOSPITAL_COMMUNITY): Payer: Medicaid Other

## 2012-03-16 ENCOUNTER — Encounter (HOSPITAL_COMMUNITY): Payer: Self-pay | Admitting: *Deleted

## 2012-03-16 DIAGNOSIS — Z87891 Personal history of nicotine dependence: Secondary | ICD-10-CM | POA: Insufficient documentation

## 2012-03-16 DIAGNOSIS — Y92009 Unspecified place in unspecified non-institutional (private) residence as the place of occurrence of the external cause: Secondary | ICD-10-CM | POA: Insufficient documentation

## 2012-03-16 DIAGNOSIS — S93409A Sprain of unspecified ligament of unspecified ankle, initial encounter: Secondary | ICD-10-CM | POA: Insufficient documentation

## 2012-03-16 DIAGNOSIS — X500XXA Overexertion from strenuous movement or load, initial encounter: Secondary | ICD-10-CM | POA: Insufficient documentation

## 2012-03-16 DIAGNOSIS — N390 Urinary tract infection, site not specified: Secondary | ICD-10-CM | POA: Insufficient documentation

## 2012-03-16 DIAGNOSIS — F319 Bipolar disorder, unspecified: Secondary | ICD-10-CM | POA: Insufficient documentation

## 2012-03-16 DIAGNOSIS — S93402A Sprain of unspecified ligament of left ankle, initial encounter: Secondary | ICD-10-CM

## 2012-03-16 DIAGNOSIS — Z3490 Encounter for supervision of normal pregnancy, unspecified, unspecified trimester: Secondary | ICD-10-CM

## 2012-03-16 LAB — URINALYSIS, ROUTINE W REFLEX MICROSCOPIC
Bilirubin Urine: NEGATIVE
Glucose, UA: NEGATIVE mg/dL
Ketones, ur: NEGATIVE mg/dL
Protein, ur: NEGATIVE mg/dL
pH: 6 (ref 5.0–8.0)

## 2012-03-16 LAB — URINE MICROSCOPIC-ADD ON

## 2012-03-16 MED ORDER — CEPHALEXIN 500 MG PO CAPS: 500.0000 mg | ORAL_CAPSULE | Freq: Four times a day (QID) | ORAL | Status: AC

## 2012-03-16 NOTE — ED Notes (Signed)
Pt also [redacted] weeks pregnant, last OB/Gyn visit couple of weeks ago, normal per pt.

## 2012-03-16 NOTE — ED Notes (Signed)
C/o left ankle pain x 3 days, fell down some steps; pt also with pain and burning on urination, also with frequent urination

## 2012-03-16 NOTE — ED Provider Notes (Signed)
History     CSN: [id number]  Arrival date & time 03/16/12  1138   First MD Initiated Contact with Patient 03/16/12 1305      Chief Complaint  Patient presents with  . Ankle Pain    (Consider location/radiation/quality/duration/timing/severity/associated sxs/prior treatment) HPI Comments: Also complains of burning with urination.  No fevers, abd pain, discharge, bleeding, bowel issues.  Similar to previous utis.    Patient is a 26 y.o. female presenting with ankle pain. The history is provided by the patient.  Ankle Pain  The incident occurred 2 days ago. The incident occurred at home. Injury mechanism: missed step and rolled ankle. The quality of the pain is described as throbbing. The pain is moderate. The pain has been constant since onset. Pertinent negatives include no numbness and no inability to bear weight. The symptoms are aggravated by activity, bearing weight and palpation.    Past Medical History  Diagnosis Date  . Abnormal Pap smear   . Anxiety   . Depression   . Bipolar 1 disorder   . UTI (urinary tract infection)   . Pregnancy   . Kidney infection     Past Surgical History  Procedure Date  . Tooth extraction     History reviewed. No pertinent family history.  History  Substance Use Topics  . Smoking status: Former Smoker    Quit date: 05/21/2007  . Smokeless tobacco: Never Used  . Alcohol Use: No    OB History    Grav Para Term Preterm Abortions TAB SAB Ect Mult Living   3 1 1  0 1 0 1 0 0 1      Review of Systems  Neurological: Negative for numbness.  All other systems reviewed and are negative.    Allergies  Shellfish allergy  Home Medications   Current Outpatient Rx  Name Route Sig Dispense Refill  . FLINTSTONES COMPLETE 60 MG PO CHEW Oral Chew 1 tablet by mouth daily.    Marland Kitchen FOLIC ACID 400 MCG PO TABS Oral Take 400 mcg by mouth every morning.    Marland Kitchen HYDROCODONE-ACETAMINOPHEN 5-325 MG PO TABS  Take one every 6 hours for pain not helped  by tylenol 12 tablet 0  . OMEPRAZOLE 20 MG PO CPDR Oral Take 20 mg by mouth every morning.      BP 115/68  Pulse 83  Temp 98.3 F (36.8 C) (Oral)  Resp 20  Ht 5\' 4"  (1.626 m)  Wt 274 lb (124.286 kg)  BMI 47.03 kg/m2  SpO2 99%  LMP 03/06/2011  Physical Exam  Nursing note and vitals reviewed. Constitutional: She is oriented to person, place, and time. She appears well-developed and well-nourished. No distress.  HENT:  Head: Normocephalic and atraumatic.  Neck: Normal range of motion. Neck supple.  Abdominal: Soft. Bowel sounds are normal. She exhibits no distension. There is no tenderness.  Musculoskeletal:       The left ankle appears grossly normal.  There is minimal, if any, swelling.  There is no proximal fib ttp and no 5th mt ttp.  Neurological: She is alert and oriented to person, place, and time.  Skin: Skin is warm and dry. She is not diaphoretic.    ED Course  Procedures (including critical care time)   Labs Reviewed  URINALYSIS, ROUTINE W REFLEX MICROSCOPIC  PREGNANCY, URINE   Dg Ankle Complete Left  03/16/2012  *RADIOLOGY REPORT*  Clinical Data: Lateral ankle pain following twisting injury 3 weeks ago.  LEFT ANKLE COMPLETE - 3+  VIEW  Comparison: None.  Findings: The patient was shielded for the examination due to pregnancy.  The mineralization and alignment are normal.  There is no evidence of acute fracture or dislocation.  The joint spaces are preserved. There is an os peroneum.  No focal soft tissue swelling is evident.  IMPRESSION: No acute osseous findings.   Original Report Authenticated By: Gerrianne Scale, M.D.      No diagnosis found.    MDM  The xrays are negative for fracture.  Will treat as a sprain.  The uti is suggestive of uti.  Due to her symptoms, will treat with keflex.          Geoffery Lyons, MD 03/16/12 1336

## 2012-04-04 ENCOUNTER — Encounter (HOSPITAL_COMMUNITY): Payer: Self-pay | Admitting: Emergency Medicine

## 2012-04-04 ENCOUNTER — Inpatient Hospital Stay (HOSPITAL_COMMUNITY)
Admission: EM | Admit: 2012-04-04 | Discharge: 2012-04-05 | Disposition: A | Discharge status: Home / Self Care | Payer: Medicaid Other | Attending: Obstetrics and Gynecology | Admitting: Obstetrics and Gynecology

## 2012-04-04 DIAGNOSIS — O4700 False labor before 37 completed weeks of gestation, unspecified trimester: Secondary | ICD-10-CM | POA: Insufficient documentation

## 2012-04-04 DIAGNOSIS — O479 False labor, unspecified: Secondary | ICD-10-CM | POA: Diagnosis present

## 2012-04-04 DIAGNOSIS — R109 Unspecified abdominal pain: Secondary | ICD-10-CM | POA: Insufficient documentation

## 2012-04-04 LAB — POCT I-STAT, CHEM 8
Chloride: 104 mEq/L (ref 96–112)
Glucose, Bld: 80 mg/dL (ref 70–99)
HCT: 36 % (ref 36.0–46.0)
Hemoglobin: 12.2 g/dL (ref 12.0–15.0)
Potassium: 3.7 mEq/L (ref 3.5–5.1)

## 2012-04-04 MED ORDER — SODIUM CHLORIDE 0.9 % IV BOLUS (SEPSIS)
500.0000 mL | Freq: Once | INTRAVENOUS | Status: AC
  Administered 2012-04-04: 1000 mL via INTRAVENOUS

## 2012-04-04 MED ORDER — ONDANSETRON HCL 4 MG/2ML IJ SOLN
4.0000 mg | Freq: Once | INTRAMUSCULAR | Status: AC
  Administered 2012-04-04: 4 mg via INTRAVENOUS
  Filled 2012-04-04: qty 2

## 2012-04-04 MED ORDER — SODIUM CHLORIDE 0.9 % IV SOLN: Freq: Once | INTRAVENOUS | Status: DC

## 2012-04-04 NOTE — ED Notes (Signed)
Nitrazine paper test completed; pH 6

## 2012-04-04 NOTE — Progress Notes (Signed)
Gave report of pt @ [facility] to K. Grace Bushy, CNM.  Pt c/o pressure.  CNM stated to have pt transferred to MAU for evaluation and for pt not to have SVE so that a FFN test could be done in MAU.  This information was given to [place], [place]

## 2012-04-04 NOTE — ED Provider Notes (Signed)
History   This chart was scribed for Ward Givens, MD scribed by Magnus Sinning. The patient was seen in room APA01/[facility]01 at 21:58   CSN: [id number]  Arrival date & time 04/04/12  2114      Chief Complaint  Patient presents with  . Abdominal Pain    (Consider location/radiation/quality/duration/timing/severity/associated sxs/prior treatment) Patient is a 26 y.o. female presenting with abdominal pain. The history is provided by the patient. No language interpreter was used.  Abdominal Pain The primary symptoms of the illness include abdominal pain.   [name] [name] is a 26 y.o. female who is [redacted] weeks pregnant who presents to the Emergency Department complaining of intermittent moderate lower abd pain ,onset three days with associated vomiting (5 episodes per days), dull lower back pain, which says feels similar to her hx of nephritis. She says she suspected her pains were braxton hicks three days ago, stating they lasted 2-3 minutes and came once every hour. She says today the pain changed and describes that it was a feeling that something was going to "tear" out of her and says she woke up today in puddle of fluid. She says it started this morning and that it comes twice every hour and lasted about one minute. Patient says she is currently mildly nauseous, but denies vaginal bleeding, diarrhea, dysuria. Patient states she has had hx of yeast infections and UTI during pregnancy. She states she just completed cream for treatment of a yeast infection three days ago.  Patient says she has been busy these past couple of days,stating she has done a lot of cleaning in her apt  Patient is unemployed and is a former smoker. She says she was more sick with prior pregnancies.  She also states her previous pregnancy was delivered at 42 weeks and child was born at 10 pounds.   P:4 G:1 AB:2  EDC: 07/11/12  OBGYN: Dr. Despina Hidden  Past Medical History  Diagnosis Date  . Abnormal Pap smear   . Anxiety   .  Depression   . Bipolar 1 disorder   . UTI (urinary tract infection)   . Pregnancy   . Kidney infection     Past Surgical History  Procedure Date  . Tooth extraction     No family history on file.  History  Substance Use Topics  . Smoking status: Former Smoker    Quit date: 05/21/2007  . Smokeless tobacco: Never Used  . Alcohol Use: No  unemployed Lives with spouse  OB History    Grav Para Term Preterm Abortions TAB SAB Ect Mult Living   3 1 1  0 1 0 1 0 0 1      Review of Systems  Gastrointestinal: Positive for abdominal pain.  10 Systems reviewed and are negative for acute change except as noted in the HPI. Allergies  Shellfish allergy  Home Medications   Current Outpatient Rx  Name Route Sig Dispense Refill  . ACETAMINOPHEN 325 MG PO TABS Oral Take 650 mg by mouth every 6 (six) hours as needed. Patient took 4 tabs this morning before coming to ER for pain    . FLINTSTONES COMPLETE 60 MG PO CHEW Oral Chew 1 tablet by mouth daily.    Marland Kitchen FOLIC ACID 400 MCG PO TABS Oral Take 400 mcg by mouth every morning.    Marland Kitchen OMEPRAZOLE 20 MG PO CPDR Oral Take 20 mg by mouth every morning.      BP 112/47  Pulse 96  Temp 97.7  F (36.5 C) (Oral)  Resp 20  Ht 5\' 6"  (1.676 m)  Wt 290 lb (131.543 kg)  BMI 46.81 kg/m2  SpO2 98%  LMP 03/06/2011  Vital signs normal    Physical Exam  Nursing note and vitals reviewed. Constitutional: She is oriented to person, place, and time. She appears well-developed and well-nourished. No distress.  HENT:  Head: Normocephalic and atraumatic.  Right Ear: External ear normal.  Left Ear: External ear normal.  Nose: Nose normal.       Tongue mildly dry.  Eyes: Conjunctivae normal and EOM are normal. Pupils are equal, round, and reactive to light.  Neck: Neck supple. No tracheal deviation present.  Cardiovascular: Normal rate, regular rhythm, normal heart sounds and intact distal pulses.   Pulmonary/Chest: Effort normal and breath sounds  normal. No respiratory distress. She has no wheezes. She has no rales. She exhibits no tenderness.  Abdominal: Soft. Bowel sounds are normal. She exhibits no distension and no mass. There is no tenderness. There is no rebound and no guarding.       Uterus c/w dates  Genitourinary:       Deferred   Musculoskeletal: Normal range of motion. She exhibits no edema and no tenderness.  Neurological: She is alert and oriented to person, place, and time. No sensory deficit.  Skin: Skin is warm and dry.  Psychiatric: She has a normal mood and affect. Her behavior is normal.    ED Course  Procedures (including critical care time)   Medications  ondansetron (ZOFRAN) injection 4 mg (not administered)  sodium chloride 0.9 % bolus 500 mL (not administered)  0.9 %  sodium chloride infusion (not administered)   Nurses checked nitrazene test which was negative (pH 6)  DIAGNOSTIC STUDIES: Oxygen Saturation is 100% on room air, normal by my interpretation.    COORDINATION OF CARE: 22:06: Fetal monitor shows heart rate of 135-156  Pt is having contractions on her monitor.   22: 15: Consult completed with ob gyn, Dr. Cherly Hensen. Patient cased discussed.She agrees with no pelvic exam being done and states no further medications suggested.  Agrees to accept at MAU.   1. Preterm labor    Plan transfer to MAU at [facility]   Devoria Albe, MD, FACEP   CRITICAL CARE Performed by: Devoria Albe L   Total critical care time: 32 min Critical care time was exclusive of separately billable procedures and treating other patients.  Critical care was necessary to treat or prevent imminent or life-threatening deterioration.  Critical care was time spent personally by me on the following activities: development of treatment plan with patient and/or surrogate as well as nursing, discussions with consultants, evaluation of patient's response to treatment, examination of patient, obtaining history from patient or  surrogate, ordering and performing treatments and interventions, ordering and review of laboratory studies, ordering and review of radiographic studies, pulse oximetry and re-evaluation of patient's condition.    MDM   I personally performed the services described in this documentation, which was scribed in my presence. The recorded information has been reviewed and considered.  Devoria Albe, MD, Armando Gang         Ward Givens, MD 04/05/12 [phone]

## 2012-04-04 NOTE — ED Notes (Signed)
[facility] called pt is contracting; MD notifed

## 2012-04-04 NOTE — Progress Notes (Signed)
Pt to [facility] reporting abdominal pain/pressure x 3 days.  Pt reports to [place], [place] that she is not having any bleeding or leaking of fluid.  +FM.

## 2012-04-04 NOTE — Progress Notes (Signed)
Pt off monitor for transport via [facility]. Report given to Alvino Chapel, RN in MAU

## 2012-04-04 NOTE — ED Notes (Addendum)
Patient states she is [redacted] weeks pregnant and has been having abdominal pain x 3 days. States she called PCP and he stated to come in if it gets worse. States "I woke up in a puddle and I don't know if I peed on myself or what."

## 2012-04-04 NOTE — ED Notes (Signed)
Patient placed on fetal monitor and Morrie Sheldon at [facility] notified.  Patient c/o intermittent abdominal pressure x 3 days; states her EDD is December 21.  Patient is followed by Dr. Despina Hidden.

## 2012-04-05 DIAGNOSIS — O479 False labor, unspecified: Secondary | ICD-10-CM

## 2012-04-05 LAB — WET PREP, GENITAL
Clue Cells Wet Prep HPF POC: NONE SEEN
Yeast Wet Prep HPF POC: NONE SEEN

## 2012-04-05 LAB — URINALYSIS, ROUTINE W REFLEX MICROSCOPIC
Hgb urine dipstick: NEGATIVE
Nitrite: NEGATIVE
Specific Gravity, Urine: 1.025 (ref 1.005–1.030)
Urobilinogen, UA: 0.2 mg/dL (ref 0.0–1.0)
pH: 6 (ref 5.0–8.0)

## 2012-04-05 LAB — FETAL FIBRONECTIN: Fetal Fibronectin: NEGATIVE

## 2012-04-05 LAB — URINE MICROSCOPIC-ADD ON

## 2012-04-05 LAB — AMNISURE RUPTURE OF MEMBRANE (ROM) NOT AT ARMC: Amnisure ROM: NEGATIVE

## 2012-04-05 NOTE — MAU Provider Note (Signed)
Chief Complaint:  Abdominal Pain   None     HPI: [name] [name] is a 26 y.o. G4P1011 at [redacted]w[redacted]d who presents to maternity admissions as a [facility] transfer from [facility] for abdominal cramping x3 days and single episode of gush of fluid this afternoon.  She reports she was cleaning all day, did not drink many fluids, and then she laid down to take a nap.  She woke up in a puddle of fluid.  After cleaning up from this episode, she has felt no more leakage of fluid. She does also report some pink spotting when she wipes for the last few days.  She reports good fetal movement, denies vaginal bleeding, vaginal itching/burning, urinary symptoms, h/a, dizziness, n/v, or fever/chills.      Past Medical History: Past Medical History  Diagnosis Date  . Abnormal Pap smear   . Anxiety   . Depression   . Bipolar 1 disorder   . UTI (urinary tract infection)   . Pregnancy   . Kidney infection      Past Surgical History: Past Surgical History  Procedure Date  . Tooth extraction     Family History: Family History  Problem Relation Age of Onset  . Hypertension Father   . Diabetes Father   . Diabetes Paternal Aunt   . Diabetes Paternal Uncle   . Hypertension Paternal Grandmother   . Diabetes Paternal Grandmother   . Diabetes Paternal Grandfather     Social History: History  Substance Use Topics  . Smoking status: Former Smoker    Quit date: 05/21/2007  . Smokeless tobacco: Never Used  . Alcohol Use: No    Allergies:  Allergies  Allergen Reactions  . Shellfish Allergy Anaphylaxis    Throat swelling    Meds:  Prescriptions prior to admission  Medication Sig Dispense Refill  . acetaminophen (TYLENOL) 325 MG tablet Take 650 mg by mouth every 6 (six) hours as needed. Patient took 4 tabs this morning before coming to ER for pain      . flintstones complete (FLINTSTONES) 60 MG chewable tablet Chew 1 tablet by mouth daily.      . folic acid (FOLVITE) 400 MCG tablet Take 400 mcg by  mouth every morning.      Marland Kitchen omeprazole (PRILOSEC) 20 MG capsule Take 20 mg by mouth every morning.        ROS: Pertinent findings in history of present illness.  Physical Exam  Blood pressure 103/65, pulse 80, temperature 97.2 F (36.2 C), temperature source Oral, resp. rate 20, height 5' 6.5" (1.689 m), weight 83.915 kg (185 lb), last menstrual period 03/06/2011, SpO2 99.00%. GENERAL: Well-developed, well-nourished female in no acute distress.  HEENT: normocephalic HEART: normal rate RESP: normal effort ABDOMEN: Soft, non-tender, gravid appropriate for gestational age EXTREMITIES: Nontender, no edema NEURO: alert and oriented Pelvic exam: Cervix pink, visually closed, without lesion, moderate amount white/yellow creamy discharge, vaginal walls and external genitalia normal External os 1cm, internal os closed  Dilation: Closed Effacement (%): Thick Exam by:: Sharen Counter, CNM  FHT:  Baseline 135 , moderate variability, accelerations present (15x15), no decelerations Contractions: No contractions on toco or palpable   Labs: Results for orders placed during the hospital encounter of 04/04/12 (from the past 48 hour(s))  POCT I-STAT, CHEM 8     Status: Normal   Collection Time   04/04/12 10:59 PM      Component Value Range Comment   Sodium 138  135 - 145 mEq/L  Potassium 3.7  3.5 - 5.1 mEq/L    Chloride 104  96 - 112 mEq/L    BUN 6  6 - 23 mg/dL    Creatinine, Ser 1.61  0.50 - 1.10 mg/dL    Glucose, Bld 80  70 - 99 mg/dL    Calcium, Ion 0.96  0.45 - 1.23 mmol/L    TCO2 22  0 - 100 mmol/L    Hemoglobin 12.2  12.0 - 15.0 g/dL    HCT 40.9  81.1 - 91.4 %   WET PREP, GENITAL     Status: Abnormal   Collection Time   04/05/12 12:10 AM      Component Value Range Comment   Yeast Wet Prep HPF POC NONE SEEN  NONE SEEN    Trich, Wet Prep NONE SEEN  NONE SEEN    Clue Cells Wet Prep HPF POC NONE SEEN  NONE SEEN    WBC, Wet Prep HPF POC MODERATE (*) NONE SEEN FEW BACTERIA SEEN    FETAL FIBRONECTIN     Status: Normal   Collection Time   04/05/12 12:10 AM      Component Value Range Comment   Fetal Fibronectin NEGATIVE  NEGATIVE   AMNISURE RUPTURE OF MEMBRANE (ROM)     Status: Normal   Collection Time   04/05/12 12:10 AM      Component Value Range Comment   Amnisure ROM NEGATIVE     URINALYSIS, ROUTINE W REFLEX MICROSCOPIC     Status: Abnormal   Collection Time   04/05/12 12:45 AM      Component Value Range Comment   Color, Urine YELLOW  YELLOW    APPearance CLEAR  CLEAR    Specific Gravity, Urine 1.025  1.005 - 1.030    pH 6.0  5.0 - 8.0    Glucose, UA NEGATIVE  NEGATIVE mg/dL    Hgb urine dipstick NEGATIVE  NEGATIVE    Bilirubin Urine NEGATIVE  NEGATIVE    Ketones, ur NEGATIVE  NEGATIVE mg/dL    Protein, ur NEGATIVE  NEGATIVE mg/dL    Urobilinogen, UA 0.2  0.0 - 1.0 mg/dL    Nitrite NEGATIVE  NEGATIVE    Leukocytes, UA TRACE (*) NEGATIVE   URINE MICROSCOPIC-ADD ON     Status: Abnormal   Collection Time   04/05/12 12:45 AM      Component Value Range Comment   Squamous Epithelial / LPF FEW (*) RARE    WBC, UA 3-6  <3 WBC/hpf    Bacteria, UA RARE  RARE    Urine-Other MUCOUS PRESENT         Assessment: 1. Braxton Hicks contractions     Plan: Discharge home Labor precautions and fetal kick counts F/U at [facility] Return to MAU as needed    Medication List     As of 04/05/2012 12:15 AM    ASK your doctor about these medications         acetaminophen 325 MG tablet   Commonly known as: TYLENOL   Take 650 mg by mouth every 6 (six) hours as needed. Patient took 4 tabs this morning before coming to ER for pain      flintstones complete 60 MG chewable tablet   Chew 1 tablet by mouth daily.      folic acid 400 MCG tablet   Commonly known as: FOLVITE   Take 400 mcg by mouth every morning.      omeprazole 20 MG capsule   Commonly known as:  PRILOSEC   Take 20 mg by mouth every morning.        Sharen Counter Certified  Nurse-Midwife 04/05/2012 12:15 AM

## 2012-04-05 NOTE — MAU Note (Signed)
Patient arrived by EMS from [facility]. Patient is discharged home.  Patient states she has been calling to have someone come pick her up.  States her husband does not have a car and she has not been able to wake anyone to come get her. [facility] [employer] RN and instructed to give cab voucher since transported from outlying hospital. Cab voucher filled out, signed and approval given by house coverage RN.

## 2012-04-06 NOTE — MAU Provider Note (Signed)
Agree with above note.  Arine Foley 04/06/2012 2:23 PM   

## 2012-04-11 ENCOUNTER — Encounter (HOSPITAL_COMMUNITY): Payer: Self-pay | Admitting: *Deleted

## 2012-04-11 ENCOUNTER — Emergency Department (HOSPITAL_COMMUNITY)
Admission: EM | Admit: 2012-04-11 | Discharge: 2012-04-11 | Disposition: A | Discharge status: Home / Self Care | Payer: Medicaid Other | Attending: Emergency Medicine | Admitting: Emergency Medicine

## 2012-04-11 DIAGNOSIS — R300 Dysuria: Secondary | ICD-10-CM | POA: Insufficient documentation

## 2012-04-11 DIAGNOSIS — F319 Bipolar disorder, unspecified: Secondary | ICD-10-CM | POA: Insufficient documentation

## 2012-04-11 DIAGNOSIS — Z87891 Personal history of nicotine dependence: Secondary | ICD-10-CM | POA: Insufficient documentation

## 2012-04-11 DIAGNOSIS — O99340 Other mental disorders complicating pregnancy, unspecified trimester: Secondary | ICD-10-CM | POA: Insufficient documentation

## 2012-04-11 DIAGNOSIS — A749 Chlamydial infection, unspecified: Secondary | ICD-10-CM

## 2012-04-11 DIAGNOSIS — O99891 Other specified diseases and conditions complicating pregnancy: Secondary | ICD-10-CM | POA: Insufficient documentation

## 2012-04-11 DIAGNOSIS — N390 Urinary tract infection, site not specified: Secondary | ICD-10-CM

## 2012-04-11 DIAGNOSIS — Z3490 Encounter for supervision of normal pregnancy, unspecified, unspecified trimester: Secondary | ICD-10-CM

## 2012-04-11 LAB — URINALYSIS, ROUTINE W REFLEX MICROSCOPIC
Glucose, UA: NEGATIVE mg/dL
Ketones, ur: NEGATIVE mg/dL
Protein, ur: 100 mg/dL — AB
Urobilinogen, UA: 1 mg/dL (ref 0.0–1.0)

## 2012-04-11 LAB — URINE MICROSCOPIC-ADD ON

## 2012-04-11 MED ORDER — AZITHROMYCIN 250 MG PO TABS
1000.0000 mg | ORAL_TABLET | Freq: Once | ORAL | Status: AC
  Administered 2012-04-11: 1000 mg via ORAL
  Filled 2012-04-11: qty 4

## 2012-04-11 MED ORDER — CEFTRIAXONE SODIUM 250 MG IJ SOLR
250.0000 mg | Freq: Once | INTRAMUSCULAR | Status: AC
  Administered 2012-04-11: 250 mg via INTRAMUSCULAR
  Filled 2012-04-11: qty 250

## 2012-04-11 MED ORDER — NITROFURANTOIN MONOHYD MACRO 100 MG PO CAPS: 100.0000 mg | ORAL_CAPSULE | Freq: Two times a day (BID) | ORAL | Status: DC

## 2012-04-11 NOTE — ED Provider Notes (Signed)
History     CSN: [id number]  Arrival date & time 04/11/12  1609   First MD Initiated Contact with Patient 04/11/12 1651      Chief Complaint  Patient presents with  . Urinary Tract Infection  . Vaginal Pain    (Consider location/radiation/quality/duration/timing/severity/associated sxs/prior treatment) HPI Comments: Patient is G4P1021 at [redacted] weeks gestation.  She was seen here two weeks ago for abd pain and was transferred to [facility] as she was contracting.  Her chlamydia test was found to be positive but they were unable to locate her after she was discharged.  She is continuing with vaginal pain, dysuria, and discharge.  There are no fevers or bleeding.    Patient is a 26 y.o. female presenting with urinary tract infection. The history is provided by the patient.  Urinary Tract Infection This is a new problem. Episode onset: one week ago. The problem occurs constantly. The problem has been gradually worsening. Nothing aggravates the symptoms. Nothing relieves the symptoms.    Past Medical History  Diagnosis Date  . Abnormal Pap smear   . Anxiety   . Depression   . Bipolar 1 disorder   . UTI (urinary tract infection)   . Pregnancy   . Kidney infection     Past Surgical History  Procedure Date  . Tooth extraction     Family History  Problem Relation Age of Onset  . Hypertension Father   . Diabetes Father   . Diabetes Paternal Aunt   . Diabetes Paternal Uncle   . Hypertension Paternal Grandmother   . Diabetes Paternal Grandmother   . Diabetes Paternal Grandfather     History  Substance Use Topics  . Smoking status: Former Smoker    Quit date: 05/21/2007  . Smokeless tobacco: Never Used  . Alcohol Use: No    OB History    Grav Para Term Preterm Abortions TAB SAB Ect Mult Living   4 1 1  0 1 0 1 0 0 1      Review of Systems  All other systems reviewed and are negative.    Allergies  Shellfish allergy  Home Medications   Current Outpatient Rx  Name  Route Sig Dispense Refill  . ACETAMINOPHEN 325 MG PO TABS Oral Take 650 mg by mouth every 6 (six) hours as needed. Patient took 4 tabs this morning before coming to ER for pain    . FLINTSTONES COMPLETE 60 MG PO CHEW Oral Chew 1 tablet by mouth daily.    Marland Kitchen FOLIC ACID 400 MCG PO TABS Oral Take 400 mcg by mouth every morning.    Marland Kitchen NAPROXEN SODIUM 220 MG PO TABS Oral Take 440 mg by mouth every 6 (six) hours as needed. pain    . OMEPRAZOLE 20 MG PO CPDR Oral Take 20 mg by mouth every morning.      BP 125/77  Pulse 99  Temp 98.6 F (37 C)  Resp 20  SpO2 100%  LMP 03/06/2011  Physical Exam  Nursing note and vitals reviewed. Constitutional: She is oriented to person, place, and time. She appears well-developed and well-nourished. No distress.  HENT:  Head: Normocephalic and atraumatic.  Mouth/Throat: Oropharynx is clear and moist.  Neck: Normal range of motion. Neck supple.  Cardiovascular: Normal rate and regular rhythm.   No murmur heard. Pulmonary/Chest: Effort normal. No respiratory distress.  Abdominal: Soft. Bowel sounds are normal. She exhibits no distension. There is no tenderness.       Gravid uterus palpable,  consistent with gestational age.  Musculoskeletal: Normal range of motion.  Neurological: She is alert and oriented to person, place, and time.  Skin: Skin is warm and dry. She is not diaphoretic.    ED Course  Procedures (including critical care time)   Labs Reviewed  URINALYSIS, ROUTINE W REFLEX MICROSCOPIC   No results found.   No diagnosis found.    MDM  The patient presents here with dysuria, having had an untreated positive chlamydial test in the past two weeks.  She is pregnant, but has been monitored, and is not contracting.  Will treat for both gc and chlamydia as well as the uti with macrobid.  Return prn for any problems.          Geoffery Lyons, MD 04/11/12 [phone]

## 2012-04-11 NOTE — ED Notes (Signed)
Pt c/o burning with urination, vaginal pain that started a day or two ago, pt states that she is [redacted] weeks pregnant, EDD 07/11/2012. Pregnancy is pt's 4th pregnancy, one live birth, two previous miscarriages.

## 2012-04-11 NOTE — Progress Notes (Signed)
Spoke with [facility] RN about pt being dc'd home and treated for UTI and chlamydia. [facility] reassuring. RN states that pt denies feeling any contractions.

## 2012-04-11 NOTE — ED Notes (Signed)
[name] [name] with [facility] states, " baby looks good. Ok to go home"

## 2012-10-12 ENCOUNTER — Ambulatory Visit (INDEPENDENT_AMBULATORY_CARE_PROVIDER_SITE_OTHER): Payer: Medicaid Other | Admitting: Adult Health

## 2012-10-12 VITALS — BP 120/68 | Ht 65.0 in | Wt 308.0 lb

## 2012-10-12 DIAGNOSIS — Z3202 Encounter for pregnancy test, result negative: Secondary | ICD-10-CM

## 2012-10-12 DIAGNOSIS — Z309 Encounter for contraceptive management, unspecified: Secondary | ICD-10-CM

## 2012-10-12 DIAGNOSIS — Z3049 Encounter for surveillance of other contraceptives: Secondary | ICD-10-CM

## 2012-10-12 MED ORDER — MEDROXYPROGESTERONE ACETATE 150 MG/ML IM SUSP
150.0000 mg | Freq: Once | INTRAMUSCULAR | Status: AC
  Administered 2012-10-12: 150 mg via INTRAMUSCULAR

## 2012-10-12 NOTE — Addendum Note (Signed)
Addended by: Richardson Chiquito on: 10/12/2012 09:29 AM   Modules accepted: Level of Service

## 2012-11-27 ENCOUNTER — Encounter: Payer: Self-pay | Admitting: *Deleted

## 2012-11-30 ENCOUNTER — Ambulatory Visit: Payer: Medicaid Other | Admitting: Adult Health

## 2013-01-08 ENCOUNTER — Telehealth: Payer: Self-pay | Admitting: Adult Health

## 2013-01-08 NOTE — Telephone Encounter (Signed)
Left message. JSY 

## 2013-01-08 NOTE — Telephone Encounter (Signed)
Spoke with pt. Having irregular bleeding. On Depo, due another shot on Tuesday. Also having pelvic pain. Pt to see provider on Tuesday as well.

## 2013-01-12 ENCOUNTER — Ambulatory Visit: Payer: Medicaid Other

## 2013-01-12 ENCOUNTER — Ambulatory Visit (INDEPENDENT_AMBULATORY_CARE_PROVIDER_SITE_OTHER): Payer: Medicaid Other | Admitting: Women's Health

## 2013-01-12 ENCOUNTER — Encounter: Payer: Self-pay | Admitting: Women's Health

## 2013-01-12 ENCOUNTER — Other Ambulatory Visit: Payer: Self-pay | Admitting: Women's Health

## 2013-01-12 VITALS — BP 120/80 | Ht 68.0 in | Wt 330.0 lb

## 2013-01-12 VITALS — BP 138/80 | Ht 69.0 in | Wt 330.4 lb

## 2013-01-12 DIAGNOSIS — Z3200 Encounter for pregnancy test, result unknown: Secondary | ICD-10-CM

## 2013-01-12 DIAGNOSIS — N938 Other specified abnormal uterine and vaginal bleeding: Secondary | ICD-10-CM

## 2013-01-12 DIAGNOSIS — N949 Unspecified condition associated with female genital organs and menstrual cycle: Secondary | ICD-10-CM

## 2013-01-12 DIAGNOSIS — N920 Excessive and frequent menstruation with regular cycle: Secondary | ICD-10-CM

## 2013-01-12 DIAGNOSIS — R635 Abnormal weight gain: Secondary | ICD-10-CM

## 2013-01-12 DIAGNOSIS — Z309 Encounter for contraceptive management, unspecified: Secondary | ICD-10-CM

## 2013-01-12 DIAGNOSIS — Z3202 Encounter for pregnancy test, result negative: Secondary | ICD-10-CM

## 2013-01-12 DIAGNOSIS — N926 Irregular menstruation, unspecified: Secondary | ICD-10-CM

## 2013-01-12 DIAGNOSIS — R102 Pelvic and perineal pain: Secondary | ICD-10-CM

## 2013-01-12 LAB — POCT HEMOGLOBIN: Hemoglobin: 12.1 g/dL — AB (ref 12.2–16.2)

## 2013-01-12 MED ORDER — MEDROXYPROGESTERONE ACETATE 150 MG/ML IM SUSP
150.0000 mg | Freq: Once | INTRAMUSCULAR | Status: AC
  Administered 2013-01-12: 150 mg via INTRAMUSCULAR

## 2013-01-12 NOTE — Patient Instructions (Signed)
Levonorgestrel intrauterine device (IUD)- Mirena What is this medicine? LEVONORGESTREL IUD (LEE voe nor jes trel) is a contraceptive (birth control) device. The device is placed inside the uterus by a healthcare professional. It is used to prevent pregnancy and can also be used to treat heavy bleeding that occurs during your period. Depending on the device, it can be used for 3 to 5 years. This medicine may be used for other purposes; ask your health care provider or pharmacist if you have questions. What should I tell my health care provider before I take this medicine? They need to know if you have any of these conditions: -abnormal Pap smear -cancer of the breast, uterus, or cervix -diabetes -endometritis -genital or pelvic infection now or in the past -have more than one sexual partner or your partner has more than one partner -heart disease -history of an ectopic or tubal pregnancy -immune system problems -IUD in place -liver disease or tumor -problems with blood clots or take blood-thinners -use intravenous drugs -uterus of unusual shape -vaginal bleeding that has not been explained -an unusual or allergic reaction to levonorgestrel, other hormones, silicone, or polyethylene, medicines, foods, dyes, or preservatives -pregnant or trying to get pregnant -breast-feeding How should I use this medicine? This device is placed inside the uterus by a health care professional. Talk to your pediatrician regarding the use of this medicine in children. Special care may be needed. Overdosage: If you think you have taken too much of this medicine contact a poison control center or emergency room at once. NOTE: This medicine is only for you. Do not share this medicine with others. What if I miss a dose? This does not apply. What may interact with this medicine? Do not take this medicine with any of the following medications: -amprenavir -bosentan -fosamprenavir This medicine may also  interact with the following medications: -aprepitant -barbiturate medicines for inducing sleep or treating seizures -bexarotene -griseofulvin -medicines to treat seizures like carbamazepine, ethotoin, felbamate, oxcarbazepine, phenytoin, topiramate -modafinil -pioglitazone -rifabutin -rifampin -rifapentine -some medicines to treat HIV infection like atazanavir, indinavir, lopinavir, nelfinavir, tipranavir, ritonavir -St. John's wort -warfarin This list may not describe all possible interactions. Give your health care provider a list of all the medicines, herbs, non-prescription drugs, or dietary supplements you use. Also tell them if you smoke, drink alcohol, or use illegal drugs. Some items may interact with your medicine. What should I watch for while using this medicine? Visit your doctor or health care professional for regular check ups. See your doctor if you or your partner has sexual contact with others, becomes HIV positive, or gets a sexual transmitted disease. This product does not protect you against HIV infection (AIDS) or other sexually transmitted diseases. You can check the placement of the IUD yourself by reaching up to the top of your vagina with clean fingers to feel the threads. Do not pull on the threads. It is a good habit to check placement after each menstrual period. Call your doctor right away if you feel more of the IUD than just the threads or if you cannot feel the threads at all. The IUD may come out by itself. You may become pregnant if the device comes out. If you notice that the IUD has come out use a backup birth control method like condoms and call your health care provider. Using tampons will not change the position of the IUD and are okay to use during your period. What side effects may I notice from receiving this  medicine? Side effects that you should report to your doctor or health care professional as soon as possible: -allergic reactions like skin rash,  itching or hives, swelling of the face, lips, or tongue -fever, flu-like symptoms -genital sores -high blood pressure -no menstrual period for 6 weeks during use -pain, swelling, warmth in the leg -pelvic pain or tenderness -severe or sudden headache -signs of pregnancy -stomach cramping -sudden shortness of breath -trouble with balance, talking, or walking -unusual vaginal bleeding, discharge -yellowing of the eyes or skin Side effects that usually do not require medical attention (report to your doctor or health care professional if they continue or are bothersome): -acne -breast pain -change in sex drive or performance -changes in weight -cramping, dizziness, or faintness while the device is being inserted -headache -irregular menstrual bleeding within first 3 to 6 months of use -nausea This list may not describe all possible side effects. Call your doctor for medical advice about side effects. You may report side effects to FDA at 1-800-FDA-1088. Where should I keep my medicine? This does not apply. NOTE: This sheet is a summary. It may not cover all possible information. If you have questions about this medicine, talk to your doctor, pharmacist, or health care provider.  2013, [employer]. (08/08/2011 1:54:04 PM)

## 2013-01-12 NOTE — Progress Notes (Addendum)
Subjective:     Patient ID: [name] [name], female   DOB: Jul 10, 1986, 27 y.o.   MRN: [id number]  HPI [name] [name] is a 27 y.o. female who presents w/ report of bleeding x 1.5 months and pelvic cramping. Has received 2 doses of depo provera since birth of child.  Due for 3rd today.  Was previously prescribed megace for DUB on depo, which helped. Has gained 30-40lbs since initiating depo.  Feels hot all the time.  Wants to change contraception- reviewed options and r/b, desires mirena. Last sexual intercourse 01/10/13. Denies abnormal/malodorous d/c, or vulvovaginal itching/irritation. Bottlefeeding.  Normal pap 2013  Review of Systems Pertinent positives listed above    Objective:   Physical Exam    BP 138/80  Ht 5\' 9"  (1.753 m)  Wt 330 lb 6.4 oz (149.868 kg)  BMI 48.77 kg/m2  LMP 11/03/2012   Spec exam: fleshy irregularity at 3 o'clock and 6 o'clock, co-exam w/ JAG                      No active bleeding, no abnormal or malodorous discharge  Bimanual: uterurs normal size/shape/contour                  Adnexae non-tender and non-palpable                  No CMT Assessment:  7 months s/p c/s Bottlefeeding DUB and significant weight gain on depo Obesity: BMI 48.77 Contraception counseling  Plan:  GC/CH, TSH today Not to receive depo today Return one day this week for mirena insertion No sex until after Mirena placed  Marge Duncans, CNM 01/12/2013 2:55 PM

## 2013-01-13 DIAGNOSIS — N938 Other specified abnormal uterine and vaginal bleeding: Secondary | ICD-10-CM | POA: Insufficient documentation

## 2013-01-13 LAB — TSH: TSH: 1.759 u[IU]/mL (ref 0.350–4.500)

## 2013-01-13 NOTE — Progress Notes (Signed)
There was no available appt for Mirena insertion this week, and pt states unable to remain abstinent until next week. Decided to receive depo today and will come back w/in next 12wks for Mirena insertion. Marge Duncans

## 2013-01-16 ENCOUNTER — Telehealth: Payer: Self-pay | Admitting: Women's Health

## 2013-01-16 NOTE — Telephone Encounter (Signed)
Left message for pt to return call. + Ch, need for treatment. Marge Duncans, [place] 01/16/2013 1:40 PM

## 2013-01-18 ENCOUNTER — Telehealth: Payer: Self-pay | Admitting: Women's Health

## 2013-01-18 DIAGNOSIS — A749 Chlamydial infection, unspecified: Secondary | ICD-10-CM | POA: Insufficient documentation

## 2013-01-18 MED ORDER — AZITHROMYCIN 500 MG PO TABS: 1000.0000 mg | ORAL_TABLET | Freq: Once | ORAL | Status: DC

## 2013-01-18 NOTE — Telephone Encounter (Signed)
Notified pt of +chlamydia, e-scribed azithromycin 1gm po x 1 to her pharmacy. Rx for same for partner Canary Brim DOB: 08-23-76, NKDA, left at counter for pt to pick up. No sex x 7d after both treated.  Cheron Every [place], [place] 01/18/2013 9:15 AM

## 2013-01-19 ENCOUNTER — Ambulatory Visit: Payer: Medicaid Other | Admitting: Advanced Practice Midwife

## 2013-03-01 ENCOUNTER — Ambulatory Visit: Payer: Medicaid Other | Admitting: Women's Health

## 2013-03-11 ENCOUNTER — Ambulatory Visit: Payer: Medicaid Other | Admitting: Advanced Practice Midwife

## 2013-03-31 ENCOUNTER — Ambulatory Visit (INDEPENDENT_AMBULATORY_CARE_PROVIDER_SITE_OTHER): Payer: Medicaid Other | Admitting: Advanced Practice Midwife

## 2013-03-31 ENCOUNTER — Encounter: Payer: Self-pay | Admitting: Advanced Practice Midwife

## 2013-03-31 VITALS — BP 120/72 | Ht 67.0 in | Wt 331.4 lb

## 2013-03-31 DIAGNOSIS — Z113 Encounter for screening for infections with a predominantly sexual mode of transmission: Secondary | ICD-10-CM

## 2013-03-31 DIAGNOSIS — Z30430 Encounter for insertion of intrauterine contraceptive device: Secondary | ICD-10-CM

## 2013-03-31 DIAGNOSIS — Z3202 Encounter for pregnancy test, result negative: Secondary | ICD-10-CM

## 2013-03-31 LAB — POCT URINE PREGNANCY: Preg Test, Ur: NEGATIVE

## 2013-03-31 NOTE — Progress Notes (Signed)
[name] [name] is a 26 y.o. year old African American female Gravida 4 Para 1112 Last pregnancy delivered: <1 years ago  who presents for placement of a Mirena IUD.  She has been on Depo and gained a lot of weight.  The risks and benefits of the method and placement have been thouroughly reviewed with the patient and all questions were answered.  Specifically the patient is aware of failure rate of 07/998, expulsion of the IUD and of possible perforation.  The patient is aware of irregular bleeding due to the method and understands the incidence of irregular bleeding diminishes with time.  Time out was performed.  A Graves speculum was placed.  The cervix was prepped using Betadine. The uterus was found to be neutral and it sounded to 8 cm.  The cervix was grasped with a tenaculum and the IUD was inserted to 8 cm.  It was pulled back 1 cm and the IUD was disengaged.  The strings were trimmed to 3 cm.  Sonogram was performed and the proper placement of the IUD was verified.  The patient was instructed on signs and symptoms of infection and to check for the strings after each menses or each month.  The patient is to refrain from intercourse for 3 days.  The patient is scheduled for a return appointment after her first menses or 4 weeks.  CRESENZO-DISHMAN,Jolonda Gomm 03/31/2013 2:54 PM

## 2013-04-01 LAB — GC/CHLAMYDIA PROBE AMP: GC Probe RNA: NEGATIVE

## 2013-04-08 ENCOUNTER — Encounter (HOSPITAL_COMMUNITY): Payer: Self-pay | Admitting: Emergency Medicine

## 2013-04-08 ENCOUNTER — Emergency Department (HOSPITAL_COMMUNITY)
Admission: EM | Admit: 2013-04-08 | Discharge: 2013-04-08 | Disposition: A | Discharge status: Home / Self Care | Payer: Medicaid Other | Attending: Emergency Medicine | Admitting: Emergency Medicine

## 2013-04-08 ENCOUNTER — Emergency Department (HOSPITAL_COMMUNITY): Payer: Medicaid Other

## 2013-04-08 DIAGNOSIS — Z87440 Personal history of urinary (tract) infections: Secondary | ICD-10-CM | POA: Insufficient documentation

## 2013-04-08 DIAGNOSIS — Y849 Medical procedure, unspecified as the cause of abnormal reaction of the patient, or of later complication, without mention of misadventure at the time of the procedure: Secondary | ICD-10-CM | POA: Insufficient documentation

## 2013-04-08 DIAGNOSIS — Z87891 Personal history of nicotine dependence: Secondary | ICD-10-CM | POA: Insufficient documentation

## 2013-04-08 DIAGNOSIS — Z8659 Personal history of other mental and behavioral disorders: Secondary | ICD-10-CM | POA: Insufficient documentation

## 2013-04-08 DIAGNOSIS — N949 Unspecified condition associated with female genital organs and menstrual cycle: Secondary | ICD-10-CM | POA: Insufficient documentation

## 2013-04-08 DIAGNOSIS — Z3202 Encounter for pregnancy test, result negative: Secondary | ICD-10-CM | POA: Insufficient documentation

## 2013-04-08 DIAGNOSIS — T8339XA Other mechanical complication of intrauterine contraceptive device, initial encounter: Secondary | ICD-10-CM | POA: Insufficient documentation

## 2013-04-08 DIAGNOSIS — Z79899 Other long term (current) drug therapy: Secondary | ICD-10-CM | POA: Insufficient documentation

## 2013-04-08 DIAGNOSIS — T8389XA Other specified complication of genitourinary prosthetic devices, implants and grafts, initial encounter: Secondary | ICD-10-CM

## 2013-04-08 LAB — PREGNANCY, URINE: Preg Test, Ur: NEGATIVE

## 2013-04-08 LAB — URINALYSIS, ROUTINE W REFLEX MICROSCOPIC
Glucose, UA: NEGATIVE mg/dL
Leukocytes, UA: NEGATIVE
Nitrite: NEGATIVE
Protein, ur: NEGATIVE mg/dL
pH: 6 (ref 5.0–8.0)

## 2013-04-08 NOTE — ED Notes (Signed)
Patient states she lost her IUD strings and feels pressure in pelvis, like her IUD is coming out.  She states she called her doctor and was told to come to ER.

## 2013-04-10 NOTE — ED Provider Notes (Signed)
CSN: [id number]     Arrival date & time 04/08/13  2207 History   First MD Initiated Contact with Patient 04/08/13 2223     Chief Complaint  Patient presents with  . Pelvic Pain   (Consider location/radiation/quality/duration/timing/severity/associated sxs/prior Treatment) HPI Comments: [name] [name] is a 27 y.o. Female presenting  With mild pelvic pressure sensation and inability to find her IUD strings tonight, the device being placed 8 days ago by her gynecologist.  She denies abdominal pain, nausea, vomiting, vaginal discharge or bleeding and has no dysuria or hematuria.  Nothing makes her symptoms better or worse.      The history is provided by the patient.    Past Medical History  Diagnosis Date  . Abnormal Pap smear   . Anxiety   . Depression   . Bipolar 1 disorder   . UTI (urinary tract infection)   . Pregnancy   . Kidney infection    Past Surgical History  Procedure Laterality Date  . Tooth extraction    . Cesarean section     Family History  Problem Relation Age of Onset  . Hypertension Father   . Diabetes Father   . Diabetes Paternal Aunt   . Diabetes Paternal Uncle   . Hypertension Paternal Grandmother   . Diabetes Paternal Grandmother   . Diabetes Paternal Grandfather    History  Substance Use Topics  . Smoking status: Former Smoker    Types: Cigarettes    Quit date: 05/21/2007  . Smokeless tobacco: Never Used  . Alcohol Use: No     Comment: occ.    OB History   Grav Para Term Preterm Abortions TAB SAB Ect Mult Living   4 2 1 1 2  0 2 0 0 2     Review of Systems  Constitutional: Negative for fever and chills.  HENT: Negative for congestion, sore throat and neck pain.   Eyes: Negative.   Respiratory: Negative.   Cardiovascular: Negative.   Gastrointestinal: Negative for nausea, vomiting and abdominal pain.  Genitourinary: Negative.  Negative for dysuria, hematuria, flank pain, vaginal discharge and vaginal pain.  Musculoskeletal: Negative  for joint swelling and arthralgias.  Skin: Negative.  Negative for rash and wound.  Neurological: Negative for dizziness, weakness, light-headedness, numbness and headaches.  Psychiatric/Behavioral: Negative.     Allergies  Shellfish allergy  Home Medications   Current Outpatient Rx  Name  Route  Sig  Dispense  Refill  . omeprazole (PRILOSEC) 20 MG capsule   Oral   Take 20 mg by mouth every morning.         Marland Kitchen levonorgestrel (MIRENA) 20 MCG/24HR IUD   Intrauterine   1 each by Intrauterine route once.         . megestrol (MEGACE) 40 MG tablet   Oral   Take 40 mg by mouth daily.          BP 138/86  Pulse 93  Temp(Src) 97.9 F (36.6 C) (Oral)  Resp 20  Ht 5\' 7"  (1.702 m)  Wt 310 lb (140.615 kg)  BMI 48.54 kg/m2  SpO2 100%  LMP 03/29/2013 Physical Exam  Nursing note and vitals reviewed. Constitutional: She appears well-developed and well-nourished.  HENT:  Head: Normocephalic and atraumatic.  Eyes: Conjunctivae are normal.  Neck: Normal range of motion.  Cardiovascular: Normal rate, regular rhythm, normal heart sounds and intact distal pulses.   Pulmonary/Chest: Effort normal and breath sounds normal. She has no wheezes.  Abdominal: Soft. Bowel sounds are normal.  There is no tenderness. There is no guarding.  Genitourinary: Uterus normal. Uterus is not tender. Cervix exhibits no motion tenderness and no discharge. Right adnexum displays no tenderness. Left adnexum displays no tenderness. No signs of injury around the vagina. No vaginal discharge found.  Unable to find IUD strings.  Cervix is parous..  Musculoskeletal: Normal range of motion.  Neurological: She is alert.  Skin: Skin is warm and dry.  Psychiatric: She has a normal mood and affect.    ED Course  Procedures (including critical care time) Labs Review Labs Reviewed  URINALYSIS, ROUTINE W REFLEX MICROSCOPIC - Abnormal; Notable for the following:    Specific Gravity, Urine >1.030 (*)    All other  components within normal limits  PREGNANCY, URINE   Imaging Review Dg Abd 1 View  04/08/2013   CLINICAL DATA:  Pelvic pain.  Evaluate IUD placement.  EXAM: ABDOMEN - 1 VIEW  COMPARISON:  No priors.  FINDINGS: Gas and stool are seen scattered throughout the colon extending to the level of the distal rectum. No pathologic distension of small bowel is noted. No gross evidence of pneumoperitoneum. IUD in position over the mid pelvis.  IMPRESSION: 1. IUD seen projecting over the central pelvis. 2. Nonobstructive bowel gas pattern. No pneumoperitoneum.   Electronically Signed   By: Trudie Reed M.D.   On: 04/08/2013 23:20    MDM   1. IUD complication, initial encounter    Patients labs and/or radiological studies were viewed and considered during the medical decision making and disposition process. IUD appears in position per KUB,  But unable to identify strings on pelvic exam.  Pt advised to f/u with her gyn tomorrow.  She agrees with this plan.    Burgess Amor, PA-C 04/10/13 1228

## 2013-04-12 ENCOUNTER — Encounter: Payer: Self-pay | Admitting: Adult Health

## 2013-04-12 ENCOUNTER — Ambulatory Visit (INDEPENDENT_AMBULATORY_CARE_PROVIDER_SITE_OTHER): Payer: Medicaid Other | Admitting: Adult Health

## 2013-04-12 VITALS — BP 112/74 | Ht 67.0 in | Wt 333.0 lb

## 2013-04-12 DIAGNOSIS — Z975 Presence of (intrauterine) contraceptive device: Secondary | ICD-10-CM

## 2013-04-12 DIAGNOSIS — Z30431 Encounter for routine checking of intrauterine contraceptive device: Secondary | ICD-10-CM

## 2013-04-12 HISTORY — DX: Presence of (intrauterine) contraceptive device: Z97.5

## 2013-04-12 NOTE — Progress Notes (Signed)
Subjective:     Patient ID: [name] [name], female   DOB: [birth year], 27 y.o.   MRN: [id number]  HPI [name] [name] is in complaining of being about to feel IUD strings.She had it inserted 9/8  And went to ER 9/18 they could not see strings either and confirmed on KUB.  Review of Systems See HPI Reviewed past medical,surgical, social and family history. Reviewed medications and allergies.     Objective:   Physical Exam BP 112/74  Ht 5\' 7"  (1.702 m)  Wt 333 lb (151.048 kg)  BMI 52.14 kg/m2  LMP 03/29/2013   On exam could not see strings and could not tease out with EC brush, did see on [place], and is in correct  placement  Assessment:     IUD in place    Plan:     Follow up prn

## 2013-04-12 NOTE — Patient Instructions (Addendum)

## 2013-04-12 NOTE — ED Provider Notes (Signed)
Medical screening examination/treatment/procedure(s) were performed by non-physician practitioner and as supervising physician I was immediately available for consultation/collaboration.   Azarah Dacy, MD 04/12/13 0905 

## 2013-04-28 ENCOUNTER — Ambulatory Visit: Payer: Medicaid Other | Admitting: Advanced Practice Midwife

## 2013-05-12 ENCOUNTER — Encounter: Payer: Self-pay | Admitting: Family Medicine

## 2013-05-12 ENCOUNTER — Ambulatory Visit (INDEPENDENT_AMBULATORY_CARE_PROVIDER_SITE_OTHER): Payer: Medicaid Other | Admitting: Family Medicine

## 2013-05-12 ENCOUNTER — Other Ambulatory Visit: Payer: Self-pay | Admitting: Family Medicine

## 2013-05-12 VITALS — BP 110/62 | HR 101 | Temp 97.3°F | Ht 61.0 in | Wt 334.1 lb

## 2013-05-12 DIAGNOSIS — Z7251 High risk heterosexual behavior: Secondary | ICD-10-CM

## 2013-05-12 DIAGNOSIS — M2550 Pain in unspecified joint: Secondary | ICD-10-CM

## 2013-05-12 DIAGNOSIS — R51 Headache: Secondary | ICD-10-CM

## 2013-05-12 DIAGNOSIS — E669 Obesity, unspecified: Secondary | ICD-10-CM

## 2013-05-12 DIAGNOSIS — R1013 Epigastric pain: Secondary | ICD-10-CM

## 2013-05-12 LAB — CBC WITH DIFFERENTIAL/PLATELET
Basophils Absolute: 0 10*3/uL (ref 0.0–0.1)
Basophils Relative: 0 % (ref 0–1)
Hemoglobin: 12.9 g/dL (ref 12.0–15.0)
MCHC: 33.3 g/dL (ref 30.0–36.0)
Monocytes Relative: 7 % (ref 3–12)
Neutro Abs: 8.3 10*3/uL — ABNORMAL HIGH (ref 1.7–7.7)
Neutrophils Relative %: 70 % (ref 43–77)
Platelets: 306 10*3/uL (ref 150–400)

## 2013-05-12 LAB — RHEUMATOID FACTOR: Rhuematoid fact SerPl-aCnc: 10 IU/mL (ref <=14)

## 2013-05-12 LAB — POCT URINALYSIS DIPSTICK
Bilirubin, UA: NEGATIVE
Glucose, UA: NEGATIVE
Ketones, UA: NEGATIVE
Leukocytes, UA: NEGATIVE
Spec Grav, UA: >=1.03

## 2013-05-12 LAB — COMPREHENSIVE METABOLIC PANEL
ALT: 29 U/L (ref 0–35)
AST: 20 U/L (ref 0–37)
Albumin: 4.3 g/dL (ref 3.5–5.2)
Alkaline Phosphatase: 81 U/L (ref 39–117)
Glucose, Bld: 87 mg/dL (ref 70–99)
Potassium: 4.4 mEq/L (ref 3.5–5.3)
Sodium: 140 mEq/L (ref 135–145)
Total Bilirubin: 0.3 mg/dL (ref 0.3–1.2)
Total Protein: 7.1 g/dL (ref 6.0–8.3)

## 2013-05-12 LAB — C-REACTIVE PROTEIN: CRP: 2.4 mg/dL — ABNORMAL HIGH (ref <0.60)

## 2013-05-12 MED ORDER — BUTALBITAL-APAP-CAFFEINE 50-325-40 MG PO TABS: ORAL_TABLET | ORAL | Status: DC

## 2013-05-12 NOTE — Patient Instructions (Signed)
Water - drink at least 64 ounces per day (spread out) Caffeine - no more than 2 cups of coffee per day Sleep - goal of 8 hours, keep working on sleep training CJ, try to get yourself to bed around 10:30pm  Headaches, Frequently Asked Questions MIGRAINE HEADACHES Q: What is migraine? What causes it? How can I treat it? A: Generally, migraine headaches begin as a dull ache. Then they develop into a constant, throbbing, and pulsating pain. You may experience pain at the temples. You may experience pain at the front or back of one or both sides of the head. The pain is usually accompanied by a combination of:  Nausea.  Vomiting.  Sensitivity to light and noise. Some people (about 15%) experience an aura (see below) before an attack. The cause of migraine is believed to be chemical reactions in the brain. Treatment for migraine may include over-the-counter or prescription medications. It may also include self-help techniques. These include relaxation training and biofeedback.  Q: What is an aura? A: About 15% of people with migraine get an "aura". This is a sign of neurological symptoms that occur before a migraine headache. You may see wavy or jagged lines, dots, or flashing lights. You might experience tunnel vision or blind spots in one or both eyes. The aura can include visual or auditory hallucinations (something imagined). It may include disruptions in smell (such as strange odors), taste or touch. Other symptoms include:  Numbness.  A "pins and needles" sensation.  Difficulty in recalling or speaking the correct word. These neurological events may last as long as 60 minutes. These symptoms will fade as the headache begins. Q: What is a trigger? A: Certain physical or environmental factors can lead to or "trigger" a migraine. These include:  Foods.  Hormonal changes.  Weather.  Stress. It is important to remember that triggers are different for everyone. To help prevent migraine  attacks, you need to figure out which triggers affect you. Keep a headache diary. This is a good way to track triggers. The diary will help you talk to your healthcare professional about your condition. Q: Does weather affect migraines? A: Bright sunshine, hot, humid conditions, and drastic changes in barometric pressure may lead to, or "trigger," a migraine attack in some people. But studies have shown that weather does not act as a trigger for everyone with migraines. Q: What is the link between migraine and hormones? A: Hormones start and regulate many of your body's functions. Hormones keep your body in balance within a constantly changing environment. The levels of hormones in your body are unbalanced at times. Examples are during menstruation, pregnancy, or menopause. That can lead to a migraine attack. In fact, about three quarters of all women with migraine report that their attacks are related to the menstrual cycle.  Q: Is there an increased risk of stroke for migraine sufferers? A: The likelihood of a migraine attack causing a stroke is very remote. That is not to say that migraine sufferers cannot have a stroke associated with their migraines. In persons under age 78, the most common associated factor for stroke is migraine headache. But over the course of a person's normal life span, the occurrence of migraine headache may actually be associated with a reduced risk of dying from cerebrovascular disease due to stroke.  Q: What are acute medications for migraine? A: Acute medications are used to treat the pain of the headache after it has started. Examples over-the-counter medications, NSAIDs, ergots, and triptans.  Q: What are the triptans? A: Triptans are the newest class of abortive medications. They are specifically targeted to treat migraine. Triptans are vasoconstrictors. They moderate some chemical reactions in the brain. The triptans work on receptors in your brain. Triptans help to  restore the balance of a neurotransmitter called serotonin. Fluctuations in levels of serotonin are thought to be a main cause of migraine.  Q: Are over-the-counter medications for migraine effective? A: Over-the-counter, or "OTC," medications may be effective in relieving mild to moderate pain and associated symptoms of migraine. But you should see your caregiver before beginning any treatment regimen for migraine.  Q: What are preventive medications for migraine? A: Preventive medications for migraine are sometimes referred to as "prophylactic" treatments. They are used to reduce the frequency, severity, and length of migraine attacks. Examples of preventive medications include antiepileptic medications, antidepressants, beta-blockers, calcium channel blockers, and NSAIDs (nonsteroidal anti-inflammatory drugs). Q: Why are anticonvulsants used to treat migraine? A: During the past few years, there has been an increased interest in antiepileptic drugs for the prevention of migraine. They are sometimes referred to as "anticonvulsants". Both epilepsy and migraine may be caused by similar reactions in the brain.  Q: Why are antidepressants used to treat migraine? A: Antidepressants are typically used to treat people with depression. They may reduce migraine frequency by regulating chemical levels, such as serotonin, in the brain.  Q: What alternative therapies are used to treat migraine? A: The term "alternative therapies" is often used to describe treatments considered outside the scope of conventional Western medicine. Examples of alternative therapy include acupuncture, acupressure, and yoga. Another common alternative treatment is herbal therapy. Some herbs are believed to relieve headache pain. Always discuss alternative therapies with your caregiver before proceeding. Some herbal products contain arsenic and other toxins. TENSION HEADACHES Q: What is a tension-type headache? What causes it? How can I  treat it? A: Tension-type headaches occur randomly. They are often the result of temporary stress, anxiety, fatigue, or anger. Symptoms include soreness in your temples, a tightening band-like sensation around your head (a "vice-like" ache). Symptoms can also include a pulling feeling, pressure sensations, and contracting head and neck muscles. The headache begins in your forehead, temples, or the back of your head and neck. Treatment for tension-type headache may include over-the-counter or prescription medications. Treatment may also include self-help techniques such as relaxation training and biofeedback. CLUSTER HEADACHES Q: What is a cluster headache? What causes it? How can I treat it? A: Cluster headache gets its name because the attacks come in groups. The pain arrives with little, if any, warning. It is usually on one side of the head. A tearing or bloodshot eye and a runny nose on the same side of the headache may also accompany the pain. Cluster headaches are believed to be caused by chemical reactions in the brain. They have been described as the most severe and intense of any headache type. Treatment for cluster headache includes prescription medication and oxygen. SINUS HEADACHES Q: What is a sinus headache? What causes it? How can I treat it? A: When a cavity in the bones of the face and skull (a sinus) becomes inflamed, the inflammation will cause localized pain. This condition is usually the result of an allergic reaction, a tumor, or an infection. If your headache is caused by a sinus blockage, such as an infection, you will probably have a fever. An x-ray will confirm a sinus blockage. Your caregiver's treatment might include antibiotics for the infection,  as well as antihistamines or decongestants.  REBOUND HEADACHES Q: What is a rebound headache? What causes it? How can I treat it? A: A pattern of taking acute headache medications too often can lead to a condition known as "rebound  headache." A pattern of taking too much headache medication includes taking it more than 2 days per week or in excessive amounts. That means more than the label or a caregiver advises. With rebound headaches, your medications not only stop relieving pain, they actually begin to cause headaches. Doctors treat rebound headache by tapering the medication that is being overused. Sometimes your caregiver will gradually substitute a different type of treatment or medication. Stopping may be a challenge. Regularly overusing a medication increases the potential for serious side effects. Consult a caregiver if you regularly use headache medications more than 2 days per week or more than the label advises. ADDITIONAL QUESTIONS AND ANSWERS Q: What is biofeedback? A: Biofeedback is a self-help treatment. Biofeedback uses special equipment to monitor your body's involuntary physical responses. Biofeedback monitors:  Breathing.  Pulse.  Heart rate.  Temperature.  Muscle tension.  Brain activity. Biofeedback helps you refine and perfect your relaxation exercises. You learn to control the physical responses that are related to stress. Once the technique has been mastered, you do not need the equipment any more. Q: Are headaches hereditary? A: Four out of five (80%) of people that suffer report a family history of migraine. Scientists are not sure if this is genetic or a family predisposition. Despite the uncertainty, a child has a 50% chance of having migraine if one parent suffers. The child has a 75% chance if both parents suffer.  Q: Can children get headaches? A: By the time they reach high school, most young people have experienced some type of headache. Many safe and effective approaches or medications can prevent a headache from occurring or stop it after it has begun.  Q: What type of doctor should I see to diagnose and treat my headache? A: Start with your primary caregiver. Discuss his or her  experience and approach to headaches. Discuss methods of classification, diagnosis, and treatment. Your caregiver may decide to recommend you to a headache specialist, depending upon your symptoms or other physical conditions. Having diabetes, allergies, etc., may require a more comprehensive and inclusive approach to your headache. The National Headache Foundation will provide, upon request, a list of [facility] physician members in your state. Document Released: 09/28/2003 Document Revised: 09/30/2011 Document Reviewed: 03/07/2008 [facility] Patient Information 2014 [place].

## 2013-05-13 LAB — GC/CHLAMYDIA PROBE AMP
CT Probe RNA: NEGATIVE
GC Probe RNA: NEGATIVE

## 2013-05-13 LAB — ANGIOTENSIN CONVERTING ENZYME: Angiotensin-Converting Enzyme: 22 U/L (ref 8–52)

## 2013-05-13 LAB — SEDIMENTATION RATE: Sed Rate: 14 mm/hr (ref 0–22)

## 2013-05-13 NOTE — Progress Notes (Signed)
  Subjective:    Patient ID: [name] [name], female    DOB: 05/26/1986, 27 y.o.   MRN: [id number]  HPI Pt here with headaches for the past month. She drinks minimal water and at least 5 cups of coffee each day. Her son (age 73 mos) still gets up to eat every 2-3 hours. She goes to bed at 2am and hets up around, then takes a short nap ion the morning. She has a hiogh stress level. She has recently started working toward sleep training her son.  Headaches are bilateral, fronatl not associated with nausea/vomiting/frevers. No associated sx.   She also mentions that she has had joint pain for several years. Previously she had been told she had arthriotis but did not know what kind and did not recall having any blood/xray/other tests for it.   She had chlamydia over the summer. Was treated.    Review of Systems per hpi     Objective:   Physical Exam  Constitutional: She is oriented to person, place, and time.  Neurological: She is alert and oriented to person, place, and time. She has normal strength. No cranial nerve deficit or sensory deficit. She displays a negative Romberg sign. GCS eye subscore is 4. GCS verbal subscore is 5. GCS motor subscore is 6.  Visual fields, eom, finger nose, rapid alternating movements, and heel toe all wnl    Nursing note and vitals reviewed. Constitutional: She is oriented to person, place, and time. She appears well-developed and well-nourished.  HENT:  Right Ear: External ear normal.  Left Ear: External ear normal.  Nose: Nose normal.  Mouth/Throat: Oropharynx is clear and moist. No oropharyngeal exudate.  Eyes: Conjunctivae are normal. Pupils are equal, round, and reactive to light.  Neck: Normal range of motion. Neck supple. No thyromegaly present.  Cardiovascular: Normal rate, regular rhythm and normal heart sounds.   Pulmonary/Chest: Effort normal and breath sounds normal.  Abdominal: Soft. Bowel sounds are normal. She exhibits no distension. There  is no tenderness. There is no rebound.  Lymphadenopathy:    She has no cervical adenopathy.  Neurological: She is alert and oriented to person, place, and time. She has normal reflexes.  Skin: Skin is warm and dry.  Psychiatric: She has a normal mood and affect. Her behavior is normal.        Assessment & Plan:  Headache(784.0) - Plan: butalbital-acetaminophen-caffeine (FIORICET) 50-325-40 MG per tablet, CBC with Differential, TSH, POCT urinalysis dipstick, Urine culture  Obese - Plan: Comprehensive metabolic panel, Hemoglobin A1c, Vit D  25 hydroxy (rtn osteoporosis monitoring), TSH, POCT urinalysis dipstick  Arthralgia - Plan: Angiotensin converting enzyme, Rheumatoid factor, Cyclic Citrul Peptide Antibody, IGG, Sedimentation Rate, C-reactive protein, B. Burgdorfi Antibodies  Problems related to high-risk sexual behavior - Plan: GC/chlamydia probe amp, urine  Orders as above. Will see her back in a month to see how the headaches are going with imprved lifestyle - increas esleep and water, decrease coffee.

## 2013-05-14 LAB — URINE CULTURE: Organism ID, Bacteria: NO GROWTH

## 2013-05-14 LAB — B. BURGDORFI ANTIBODIES BY WB: B burgdorferi IgG Abs (IB): NEGATIVE

## 2013-05-20 ENCOUNTER — Ambulatory Visit: Payer: Medicaid Other | Admitting: Family Medicine

## 2013-05-24 ENCOUNTER — Encounter: Payer: Self-pay | Admitting: Family Medicine

## 2013-05-24 ENCOUNTER — Ambulatory Visit: Payer: Medicaid Other | Admitting: Family Medicine

## 2013-05-24 ENCOUNTER — Ambulatory Visit (INDEPENDENT_AMBULATORY_CARE_PROVIDER_SITE_OTHER): Payer: Medicaid Other | Admitting: Family Medicine

## 2013-05-24 VITALS — BP 118/62 | Temp 97.0°F | Wt 337.0 lb

## 2013-05-24 DIAGNOSIS — J302 Other seasonal allergic rhinitis: Secondary | ICD-10-CM

## 2013-05-24 DIAGNOSIS — N390 Urinary tract infection, site not specified: Secondary | ICD-10-CM

## 2013-05-24 DIAGNOSIS — J309 Allergic rhinitis, unspecified: Secondary | ICD-10-CM

## 2013-05-24 LAB — POCT URINALYSIS DIPSTICK
Nitrite, UA: POSITIVE
Urobilinogen, UA: 4
pH, UA: 6.5

## 2013-05-24 MED ORDER — LORATADINE 10 MG PO TABS: 10.0000 mg | ORAL_TABLET | Freq: Every day | ORAL | Status: DC

## 2013-05-24 MED ORDER — SULFAMETHOXAZOLE-TMP DS 800-160 MG PO TABS: 1.0000 | ORAL_TABLET | Freq: Two times a day (BID) | ORAL | Status: DC

## 2013-05-24 MED ORDER — PHENAZOPYRIDINE HCL 200 MG PO TABS: 200.0000 mg | ORAL_TABLET | Freq: Three times a day (TID) | ORAL | Status: DC | PRN

## 2013-05-24 NOTE — Progress Notes (Signed)
  Subjective:    Patient ID: [name] [name], female    DOB: Nov 05, 1985, 27 y.o.   MRN: [id number]  HPI Pt here with 2 days of urinary frequency, urgency, and burning. She has had no fevers or vomiting but has had nausea and some low back pain. Sx have been worsening today. She has a h/o multiple UTIs in the past - roughly every 4 mos she says she has one. One physician told her she likely had a reflux but she has never had any imaging to confirm this. She has not seen urology.   She also requests a refill on her claritin as her allergies have been bothering her recently.  Review of Systems per hpi     Objective:   Physical Exam Nursing note and vitals reviewed. Constitutional: She is oriented to person, place, and time. She appears well-developed and well-nourished. morbidly obese. Cardiovascular: Normal rate, regular rhythm and normal heart sounds.   Pulmonary/Chest: Effort normal and breath sounds normal.  Abdominal: Soft. Bowel sounds are normal. She exhibits no distension.  There is no rebound.  + suprapubic ttp and mild cvat Neurological: She is alert and oriented to person, place, and time. She has normal reflexes.  Skin: Skin is warm and dry.  Psychiatric: She has a normal mood and affect. Her behavior is normal.         Assessment & Plan:  UTI (urinary tract infection) - Plan: POCT urinalysis dipstick, Urine culture, sulfamethoxazole-trimethoprim (BACTRIM DS) 800-160 MG per tablet, phenazopyridine (PYRIDIUM) 200 MG tablet  Recurrent UTI - Plan: Ambulatory referral to Urology  Seasonal allergies - Plan: loratadine (CLARITIN) 10 MG tablet

## 2013-05-24 NOTE — Patient Instructions (Signed)
Urinary Tract Infection  Urinary tract infections (UTIs) can develop anywhere along your urinary tract. Your urinary tract is your body's drainage system for removing wastes and extra water. Your urinary tract includes two kidneys, two ureters, a bladder, and a urethra. Your kidneys are a pair of bean-shaped organs. Each kidney is about the size of your fist. They are located below your ribs, one on each side of your spine.  CAUSES  Infections are caused by microbes, which are microscopic organisms, including fungi, viruses, and bacteria. These organisms are so small that they can only be seen through a microscope. Bacteria are the microbes that most commonly cause UTIs.  SYMPTOMS   Symptoms of UTIs may vary by age and gender of the patient and by the location of the infection. Symptoms in young women typically include a frequent and intense urge to urinate and a painful, burning feeling in the bladder or urethra during urination. Older women and men are more likely to be tired, shaky, and weak and have muscle aches and abdominal pain. A fever may mean the infection is in your kidneys. Other symptoms of a kidney infection include pain in your back or sides below the ribs, nausea, and vomiting.  DIAGNOSIS  To diagnose a UTI, your caregiver will ask you about your symptoms. Your caregiver also will ask to provide a urine sample. The urine sample will be tested for bacteria and white blood cells. White blood cells are made by your body to help fight infection.  TREATMENT   Typically, UTIs can be treated with medication. Because most UTIs are caused by a bacterial infection, they usually can be treated with the use of antibiotics. The choice of antibiotic and length of treatment depend on your symptoms and the type of bacteria causing your infection.  HOME CARE INSTRUCTIONS   If you were prescribed antibiotics, take them exactly as your caregiver instructs you. Finish the medication even if you feel better after you  have only taken some of the medication.   Drink enough water and fluids to keep your urine clear or pale yellow.   Avoid caffeine, tea, and carbonated beverages. They tend to irritate your bladder.   Empty your bladder often. Avoid holding urine for long periods of time.   Empty your bladder before and after sexual intercourse.   After a bowel movement, women should cleanse from front to back. Use each tissue only once.  SEEK MEDICAL CARE IF:    You have back pain.   You develop a fever.   Your symptoms do not begin to resolve within 3 days.  SEEK IMMEDIATE MEDICAL CARE IF:    You have severe back pain or lower abdominal pain.   You develop chills.   You have nausea or vomiting.   You have continued burning or discomfort with urination.  MAKE SURE YOU:    Understand these instructions.   Will watch your condition.   Will get help right away if you are not doing well or get worse.  Document Released: 04/17/2005 Document Revised: 01/07/2012 Document Reviewed: 08/16/2011  ExitCare Patient Information 2014 [employer].

## 2013-05-26 ENCOUNTER — Telehealth: Payer: Self-pay | Admitting: *Deleted

## 2013-05-26 LAB — URINE CULTURE

## 2013-05-26 NOTE — Telephone Encounter (Signed)
Message copied by Rolena Infante on Wed May 26, 2013  4:07 PM ------      Message from: Acey Lav      Created: Wed May 26, 2013  1:05 PM       Please let pt know that her uti was caused by e coli as expected. It is sensitive to the bactrim I prescribed. If she is not feeling better please let me know. Thanks AW. ------

## 2013-05-26 NOTE — Telephone Encounter (Signed)
Called to let pt know of her lab results.

## 2013-05-31 ENCOUNTER — Other Ambulatory Visit (HOSPITAL_COMMUNITY): Payer: Self-pay | Admitting: Urology

## 2013-05-31 DIAGNOSIS — N390 Urinary tract infection, site not specified: Secondary | ICD-10-CM

## 2013-06-02 ENCOUNTER — Other Ambulatory Visit: Payer: Self-pay | Admitting: Family Medicine

## 2013-06-03 ENCOUNTER — Ambulatory Visit (HOSPITAL_COMMUNITY): Payer: Medicaid Other

## 2013-06-07 ENCOUNTER — Ambulatory Visit (HOSPITAL_COMMUNITY)
Admission: RE | Admit: 2013-06-07 | Discharge: 2013-06-07 | Disposition: A | Discharge status: Home / Self Care | Payer: Medicaid Other | Source: Ambulatory Visit | Attending: Urology | Admitting: Urology

## 2013-06-07 DIAGNOSIS — N390 Urinary tract infection, site not specified: Secondary | ICD-10-CM | POA: Insufficient documentation

## 2013-06-07 DIAGNOSIS — Z87440 Personal history of urinary (tract) infections: Secondary | ICD-10-CM | POA: Insufficient documentation

## 2013-06-09 ENCOUNTER — Ambulatory Visit: Payer: Medicaid Other | Admitting: Family Medicine

## 2013-06-10 ENCOUNTER — Ambulatory Visit: Payer: Medicaid Other | Admitting: Family Medicine

## 2013-06-14 ENCOUNTER — Telehealth (HOSPITAL_COMMUNITY): Payer: Self-pay | Admitting: Dietician

## 2013-06-14 ENCOUNTER — Encounter: Payer: Self-pay | Admitting: Family Medicine

## 2013-06-14 ENCOUNTER — Ambulatory Visit (INDEPENDENT_AMBULATORY_CARE_PROVIDER_SITE_OTHER): Payer: Medicaid Other | Admitting: Family Medicine

## 2013-06-14 VITALS — BP 128/86 | HR 102 | Temp 97.0°F | Resp 24 | Ht 63.0 in | Wt 336.2 lb

## 2013-06-14 DIAGNOSIS — R7303 Prediabetes: Secondary | ICD-10-CM

## 2013-06-14 DIAGNOSIS — R059 Cough, unspecified: Secondary | ICD-10-CM

## 2013-06-14 DIAGNOSIS — R05 Cough: Secondary | ICD-10-CM

## 2013-06-14 DIAGNOSIS — R7309 Other abnormal glucose: Secondary | ICD-10-CM

## 2013-06-14 MED ORDER — ALBUTEROL SULFATE HFA 108 (90 BASE) MCG/ACT IN AERS: 2.0000 | INHALATION_SPRAY | Freq: Four times a day (QID) | RESPIRATORY_TRACT | Status: DC | PRN

## 2013-06-14 MED ORDER — AEROCHAMBER PLUS MISC: Status: DC

## 2013-06-14 NOTE — Telephone Encounter (Signed)
Referral located in workque from [facility] for dx: prediabetes.

## 2013-06-14 NOTE — Telephone Encounter (Signed)
Called at [phone] and call went straight to voicemail. Left message on voicemail and instructed to call back.

## 2013-06-14 NOTE — Progress Notes (Signed)
  Subjective:    Patient ID: [name] [name], female    DOB: [birth year], 27 y.o.   MRN: [id number]  HPI Pt here for f/u  Headaches - improved, completely off caffeine (soda, coffee), sleeping 6-7 hours per night, hydrating better (unsure how many ounces).  Arthralgia - worse with either inimal movement or lots of movement, no fevers or joint specific pain - is all over body pains.  Morbid obesity - pt says she has tried eating better but it does not work. She would like diet pills. Significant other in room asks if wii fit or the game dance dance revolution would be good exercise. They play 5-6 hours daily of video games anyway so he thought this might help her weight.   UTIs - had urethra stretched, following in 1 month with uro. No UTI sx since.   Vitamin D - found to be low on lab  Pre-DM - seen on lab, no polyuria or polydipsia. Has strong FH of DM  Cough - going on over 2 mos. Family has had URIs but everyone elses coughs have gotten better. Worse at night. No h/o asthma. No chest pain or hemoptysis. Former smoker (quit 2008)  Review of Systems per hpi     Objective:   Physical Exam Nursing note and vitals reviewed. Constitutional: She is oriented to person, place, and time. She appears well-developed and well-nourished. morbidly obese HENT:  Right Ear: External ear normal.  Left Ear: External ear normal.  Nose: Nose normal.  Mouth/Throat: Oropharynx is clear and moist. No oropharyngeal exudate.  Eyes: Conjunctivae are normal. Pupils are equal, round, and reactive to light.  Neck: Normal range of motion. Neck supple. No thyromegaly present.  Cardiovascular: Normal rate, regular rhythm and normal heart sounds.   Pulmonary/Chest: Effort normal and breath sounds normal. large breasts. Abdominal: Soft. Bowel sounds are normal. She exhibits no distension. There is no tenderness. There is no rebound.  Lymphadenopathy:    She has no cervical adenopathy.  Neurological: She is  alert and oriented to person, place, and time. She has normal reflexes.  Skin: Skin is warm and dry.  Psychiatric: She has a normal mood and affect. Her behavior is normal.         Assessment & Plan:   Pre-diabetes - Plan: Ambulatory referral to diabetic education, recheck a1c in 3-6 mos  Cough - Plan: albuterol (PROVENTIL HFA;VENTOLIN HFA) 108 (90 BASE) MCG/ACT inhaler, Spacer/Aero-Holding Chambers (AEROCHAMBER PLUS) inhaler, will see if this helps. If not will need cxr.   Morbid obesity - unable to go to baritrics due to mcd. i think the wii fit and DDR is a great idea and encouraged her to do this 20-30 mins (minimum) at least 5-6 dsys weekly. Thyroid and related labs have been reassuring.   Vit D - OTC replacement for now, 600-800 IU daily, recheck at 3 mo f/u. If not back to wnl consider high dose.   Arthralgia - labs reassuring, crp elevated but in isloation im les concerned than if was with other abnormals. Will follow for now. Suspect related to obesity. Mush of her pain, per her, is also in her back and i suspect related to breast size. Could at some point consider augmentation.   Headaches - cont current course. Imaging if worsens. Also may need to consider sleep study.   Will see her in 3 mos for cpe and f/u, earlier if needed UTIs cont to f/u w uro

## 2013-06-14 NOTE — Patient Instructions (Signed)
Vitamin D Deficiency  Vitamin D is an important vitamin that your body needs. Having too little of it in your body is called a deficiency. A very bad deficiency can make your bones soft and can cause a condition called rickets.   Vitamin D is important to your body for different reasons, such as:   · It helps your body absorb 2 minerals called calcium and phosphorus.  · It helps make your bones healthy.  · It may prevent some diseases, such as diabetes and multiple sclerosis.  · It helps your muscles and heart.  You can get vitamin D in several ways. It is a natural part of some foods. The vitamin is also added to some dairy products and cereals. Some people take vitamin D supplements. Also, your body makes vitamin D when you are in the sun. It changes the sun's rays into a form of the vitamin that your body can use.  CAUSES   · Not eating enough foods that contain vitamin D.  · Not getting enough sunlight.  · Having certain digestive system diseases that make it hard to absorb vitamin D. These diseases include Crohn's disease, chronic pancreatitis, and cystic fibrosis.  · Having a surgery in which part of the stomach or small intestine is removed.  · Being obese. Fat cells pull vitamin D out of your blood. That means that obese people may not have enough vitamin D left in their blood and in other body tissues.  · Having chronic kidney or liver disease.  RISK FACTORS  Risk factors are things that make you more likely to develop a vitamin D deficiency. They include:  · Being older.  · Not being able to get outside very much.  · Living in a nursing home.  · Having had broken bones.  · Having weak or thin bones (osteoporosis).  · Having a disease or condition that changes how your body absorbs vitamin D.  · Having dark skin.  · Some medicines such as seizure medicines or steroids.  · Being overweight or obese.  SYMPTOMS  Mild cases of vitamin D deficiency may not have any symptoms. If you have a very bad case, symptoms  may include:  · Bone pain.  · Muscle pain.  · Falling often.  · Broken bones caused by a minor injury, due to osteoporosis.  DIAGNOSIS  A blood test is the best way to tell if you have a vitamin D deficiency.  TREATMENT  Vitamin D deficiency can be treated in different ways. Treatment for vitamin D deficiency depends on what is causing it. Options include:  · Taking vitamin D supplements.  · Taking a calcium supplement. Your caregiver will suggest what dose is best for you.  HOME CARE INSTRUCTIONS  · Take any supplements that your caregiver prescribes. Follow the directions carefully. Take only the suggested amount.  · Have your blood tested 2 months after you start taking supplements.  · Eat foods that contain vitamin D. Healthy choices include:  · Fortified dairy products, cereals, or juices. Fortified means vitamin D has been added to the food. Check the label on the package to be sure.  · Fatty fish like salmon or trout.  · Eggs.  · Oysters.  · Do not use a tanning bed.  · Keep your weight at a healthy level. Lose weight if you need to.  · Keep all follow-up appointments. Your caregiver will need to perform blood tests to make sure your vitamin D deficiency   is going away.  SEEK MEDICAL CARE IF:  · You have any questions about your treatment.  · You continue to have symptoms of vitamin D deficiency.  · You have nausea or vomiting.  · You are constipated.  · You feel confused.  · You have severe abdominal or back pain.  MAKE SURE YOU:  · Understand these instructions.  · Will watch your condition.  · Will get help right away if you are not doing well or get worse.  Document Released: 09/30/2011 Document Revised: 11/02/2012 Document Reviewed: 09/30/2011  ExitCare® Patient Information ©2014 [employer].

## 2013-06-21 NOTE — Telephone Encounter (Signed)
Called back at [phone]. Appointment scheduled for 06/23/13 at 0900.

## 2013-06-21 NOTE — Telephone Encounter (Signed)
Called at [phone]. Left message to call back.

## 2013-06-21 NOTE — Telephone Encounter (Signed)
Received voicemail left on 11/26 at 1537.

## 2013-06-21 NOTE — Telephone Encounter (Signed)
Received voicemail left at [phone].

## 2013-06-23 ENCOUNTER — Encounter (HOSPITAL_COMMUNITY): Payer: Self-pay | Admitting: Dietician

## 2013-06-23 NOTE — Progress Notes (Signed)
Outpatient Initial Nutrition Assessment  Date:06/23/2013   Appt Start Time: 0923  Pt was 20 minutes late for appointment   Referring Physician: [facility] Reason for Visit: obesity, prediabetes  Nutrition Assessment:  Height: 5\' 3"  (160 cm)   Weight: 335 lb (151.955 kg)   IBW: 115#  %IBW: 291% UBW: 285#  %UBW: 118% Body mass index is 59.36 kg/(m^2). Meets criteria for extreme obesity, class III.  Goal Weight: 301# (10% weight loss of current weight) Weight hx: Wt Readings from Last 10 Encounters:  06/23/13 335 lb (151.955 kg)  06/14/13 336 lb 4 oz (152.522 kg)  05/24/13 337 lb (152.862 kg)  05/12/13 334 lb 2 oz (151.558 kg)  04/12/13 333 lb (151.048 kg)  04/08/13 310 lb (140.615 kg)  03/31/13 331 lb 6.4 oz (150.322 kg)  01/12/13 330 lb (149.687 kg)  01/12/13 330 lb 6.4 oz (149.868 kg)  10/12/12 308 lb (139.708 kg)   Pt reports UBW of 285# approximately one year ago. She reports Lowest adult weight of 210#, prior to having her 78 year old daughter.   Estimated nutritional needs:  Kcals/ day: 2200-2300 Protein (grams)/day: 122-152 Fluid (L)/ day: 2.2-2.3  PMH:  Past Medical History  Diagnosis Date  . Abnormal Pap smear   . Anxiety   . Depression   . Bipolar 1 disorder   . UTI (urinary tract infection)   . Pregnancy   . Kidney infection   . IUD (intrauterine device) in place 04/12/2013    Had IUD inserted 03/29/13 could not feel strings went to ER 9/18 and KUB saw IUD still could not see string, can't see now but seen in [place]    Medications:  Current Outpatient Rx  Name  Route  Sig  Dispense  Refill  . albuterol (PROVENTIL HFA;VENTOLIN HFA) 108 (90 BASE) MCG/ACT inhaler   Inhalation   Inhale 2 puffs into the lungs every 6 (six) hours as needed for wheezing or shortness of breath.   1 Inhaler   0   . butalbital-acetaminophen-caffeine (FIORICET) 50-325-40 MG per tablet      1-2 tabs up to every 4 hours as needed for headache. Do not take more than 6 tabs in 24 hours.   40  tablet   0   . levonorgestrel (MIRENA) 20 MCG/24HR IUD   Intrauterine   1 each by Intrauterine route once.         . loratadine (CLARITIN) 10 MG tablet   Oral   Take 1 tablet (10 mg total) by mouth daily.   30 tablet   11   . Spacer/Aero-Holding Chambers (AEROCHAMBER PLUS) inhaler      Use as instructed   1 each   2     Labs: CMP     Component Value Date/Time   NA 140 05/12/2013 1534   K 4.4 05/12/2013 1534   CL 107 05/12/2013 1534   CO2 25 05/12/2013 1534   GLUCOSE 87 05/12/2013 1534   BUN 13 05/12/2013 1534   CREATININE 0.90 05/12/2013 1534   CREATININE 0.80 04/04/2012 2259   CALCIUM 9.5 05/12/2013 1534   PROT 7.1 05/12/2013 1534   ALBUMIN 4.3 05/12/2013 1534   AST 20 05/12/2013 1534   ALT 29 05/12/2013 1534   ALKPHOS 81 05/12/2013 1534   BILITOT 0.3 05/12/2013 1534   GFRNONAA >90 02/06/2012 1352   GFRAA >90 02/06/2012 1352    Lipid Panel  No results found for this basename: chol, trig, hdl, cholhdl, vldl, ldlcalc     Lab  Results  Component Value Date   HGBA1C 5.7* 05/12/2013   Lab Results  Component Value Date   CREATININE 0.90 05/12/2013     Lifestyle/ social habits: Ms. Luiz Blare resides in [place] with her 46 month old son and roommate. She reports her husband of 5 years lives a few houses down and does not currently live with her due to the lack of space in her current home (they are currently looking for a larger home to fit their needs). She reports she also has a 66 year old daughter who lives with her mother.  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  She reports that her stress level is averages, rating her stress on a 4/10 scale, citing her living environment and lack of employment,ent are her main sources of stress.  Unfortunately, she is physically inactive. She reports physical activity is  difficult for her due to arthritis, which she reports is due to her weight. She reports she walks in stores 3-4 times per week and has an active job. 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 She also purchased a Liana Crocker DVD, which she is contemplating using as well.  Nutrition hx/habits: Ms. Luiz Blare reports no changes in her diet in the past 6 months to a year. She reports that she recently started using Slim Quick and Hydroxycut "to help get a least 20 pounds off of me". She reports that he has tried multiple fad diets in the past including, [place], Grapefruit Diet, Bread Diet (carb only diet), and the Potato Diet (only consumed sweet potatoes). She reports she did not lose weight while on any of these plans; she recalled that she gained 30# on the Potato Diet.  She skips breakfast and admits to eating a very large snack in the afternoon. She also eats late at night, usually around 9 PM. She reports that she is always hungry and craves bread, which she described as her greatest weakness. She also reports concern for her health, revealing a strong family hx of diabetes, including multiple family members on her father's side. Her mother has prediabetes. She also reports that obesity is prevalent in her family; her sisters (ages 51 and 78) are also morbidly obese, per her report.   Diet recall: Breakfast: skip; Lunch: 6 inch [employer] cold cut sandwich, 2 oatmeal raisin cookies, sprakling water; Afternoon snack (3 PM): 1.5 dinner rolls, 3 pieces of breaded cat fish, mashed potatoes, one can on Brisk Tea; Dinner: bowl of hamburger helper. Beverages consist mostly of water, sparkling water, and one can of Brisk Tea daily.   Nutrition Diagnosis: Excessive carbohydrate intake r/t disordered eating pattern, undesirable food choices AEB Hgb A1c: 5.7.  Nutrition Intervention: Nutrition rx: 1700-1800 kcal NAS, no sugar added dietl 3 meals per  day; limit one starch per meal; no snacks; low calorie beverages only; 30 minutes physical activity 5 times per week  Education/Counseling Provided: Educated pt on principles of weight management. Reviewed labs and diet recall with pt. Educated pt on Hgb A1c and discussed that she is at higher risk for developing diabetes due to lab values, obesity, physical inactivity, and family hx. Discussed how lifestyle modifications can improve her overall health and prevent onset of diabetes. Discussed principles of energy expenditure and how changes in diet  and physical activity affect weight status. Discussed nutritional content of commonly eaten foods and suggested healthier alternatives. Educated pt on plate method and a general, healthful diet that includes low fat dairy, lean meats, whole fruits and vegetables, and whole grains most often. Discussed pros and cons of fad diets and OTC diet pills and encouraged with loss through commitment to lifestyle modifications. Discussed importance of a healthy diet along with regular physical activity (at least 30 minutes 5 times per week) to achieve weight loss goals. Discussed importance of a consistent eating pattern to improve glucemic control. Encouraged slow, moderate weight loss (0.5-2# weight loss per week) and adopting healthy lifestyle changes vs. obtaining a certain body type or weight. Encouraged weighing self weekly at a consistent day and time of choice. Showed pt functionality of MyFitnessPal and encouraged using a food diary to better track caloric intake. Provided AND Nutrition Care Manual's "1500 Calorie Diet" and "Weight Loss Tips" handouts. 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 Used TeachBack to assess understanding.   Understanding, Motivation, Ability to Follow Recommendations: Expect fair to good compliance.   Monitoring and Evaluation: Goals: 1) 0.5-2# weight loss per week; 2) 30 minutes physical activity daily; 3) Hgb A1c <  5.6; 4) 3 meals per day  Recommendations: 1) Break up exercise into smaller, more frequent sessions; 2) Keep food diary (ex. MyFitness Pal); 3) Keep quick breakfast foods on hand; 4) Discontinue diet pills  F/U: 6-8 weeks. Scheduled for 08/06/13 at 0900.   Dnyla Antonetti A. Mayford Knife, RD, LDN 06/23/2013  Appt EndTime: 1021

## 2013-08-03 ENCOUNTER — Encounter: Payer: Self-pay | Admitting: Family Medicine

## 2013-08-03 ENCOUNTER — Ambulatory Visit (INDEPENDENT_AMBULATORY_CARE_PROVIDER_SITE_OTHER): Payer: Medicaid Other | Admitting: Family Medicine

## 2013-08-03 VITALS — BP 112/78 | HR 99 | Temp 98.0°F | Resp 20 | Ht 63.0 in | Wt 335.0 lb

## 2013-08-03 DIAGNOSIS — Z975 Presence of (intrauterine) contraceptive device: Secondary | ICD-10-CM

## 2013-08-03 DIAGNOSIS — R0683 Snoring: Secondary | ICD-10-CM | POA: Insufficient documentation

## 2013-08-03 DIAGNOSIS — R0989 Other specified symptoms and signs involving the circulatory and respiratory systems: Secondary | ICD-10-CM

## 2013-08-03 DIAGNOSIS — R519 Headache, unspecified: Secondary | ICD-10-CM | POA: Insufficient documentation

## 2013-08-03 DIAGNOSIS — R51 Headache: Secondary | ICD-10-CM

## 2013-08-03 DIAGNOSIS — R0609 Other forms of dyspnea: Secondary | ICD-10-CM

## 2013-08-03 MED ORDER — SUMATRIPTAN SUCCINATE 50 MG PO TABS: ORAL_TABLET | ORAL | Status: DC

## 2013-08-03 MED ORDER — NAPROXEN 500 MG PO TABS: ORAL_TABLET | ORAL | Status: DC

## 2013-08-03 NOTE — Patient Instructions (Signed)
Sumatriptan tablets What is this medicine? SUMATRIPTAN (soo ma TRIP tan) is used to treat migraines with or without aura. An aura is a strange feeling or visual disturbance that warns you of an attack. It is not used to prevent migraines. This medicine may be used for other purposes; ask your health care provider or pharmacist if you have questions. COMMON BRAND NAME(S): Imitrex What should I tell my health care provider before I take this medicine? They need to know if you have any of these conditions: -bowel disease or colitis -diabetes -family history of heart disease -fast or irregular heart beat -heart or blood vessel disease, angina (chest pain), or previous heart attack -high blood pressure -high cholesterol -history of stroke, transient ischemic attacks (TIAs or mini-strokes), or intracranial bleeding -kidney or liver disease -overweight -poor circulation -postmenopausal or surgical removal of uterus and ovaries -Raynaud's disease -seizure disorder -an unusual or allergic reaction to sumatriptan, other medicines, foods, dyes, or preservatives -pregnant or trying to get pregnant -breast-feeding How should I use this medicine? Take this medicine by mouth with a glass of water. Follow the directions on the prescription label. This medicine is taken at the first symptoms of a migraine. It is not for everyday use. If your migraine headache returns after one dose, you can take another dose as directed. You must leave at least 2 hours between doses, and do not take more than 100 mg as a single dose. Do not take more than 200 mg total in any 24 hour period. If there is no improvement at all after the first dose, do not take a second dose without talking to your doctor or health care professional. Do not take your medicine more often than directed. Talk to your pediatrician regarding the use of this medicine in children. Special care may be needed. Overdosage: If you think you have taken  too much of this medicine contact a poison control center or emergency room at once. NOTE: This medicine is only for you. Do not share this medicine with others. What if I miss a dose? This does not apply; this medicine is not for regular use. What may interact with this medicine? Do not take this medicine with any of the following medicines: -amphetamine or cocaine -dihydroergotamine, ergotamine, ergoloid mesylates, methysergide, or ergot-type medication - do not take within 24 hours of taking sumatriptan -feverfew -MAOIs like Carbex, Eldepryl, Marplan, Nardil, and Parnate - do not take sumatriptan within 2 weeks of stopping MAOI therapy -other migraine medicines like almotriptan, eletriptan, naratriptan, rizatriptan, zolmitriptan - do not take within 24 hours of taking sumatriptan -tryptophan This medicine may also interact with the following medications: -lithium -medicines for mental depression, anxiety or mood problems -medicines for weight loss such as dexfenfluramine, dextroamphetamine, fenfluramine, or sibutramine -St. John's wort This list may not describe all possible interactions. Give your health care provider a list of all the medicines, herbs, non-prescription drugs, or dietary supplements you use. Also tell them if you smoke, drink alcohol, or use illegal drugs. Some items may interact with your medicine. What should I watch for while using this medicine? Only take this medicine for a migraine headache. Take it if you get warning symptoms or at the start of a migraine attack. It is not for regular use to prevent migraine attacks. You may get drowsy or dizzy. Do not drive, use machinery, or do anything that needs mental alertness until you know how this medicine affects you. To reduce dizzy or fainting spells, do not  sit or stand up quickly, especially if you are an older patient. Alcohol can increase drowsiness, dizziness and flushing. Avoid alcoholic drinks. Smoking cigarettes  may increase the risk of heart-related side effects from using this medicine. If you take migraine medicines for 10 or more days a month, your migraines may get worse. Keep a diary of headache days and medicine use. Contact your healthcare professional if your migraine attacks occur more frequently. What side effects may I notice from receiving this medicine? Side effects that you should report to your doctor or health care professional as soon as possible: -allergic reactions like skin rash, itching or hives, swelling of the face, lips, or tongue -fast, slow, or irregular heart beat -hallucinations -increased or decreased blood pressure -seizures -severe stomach pain and cramping, bloody diarrhea -signs and symptoms of a blood clot such as breathing problems; changes in vision; chest pain; severe, sudden headache; pain, swelling, warmth in the leg; trouble speaking; sudden numbness or weakness of the face, arm or leg -tingling, pain, or numbness in the face, hands or feet  Side effects that usually do not require medical attention (report to your doctor or health care professional if they continue or are bothersome): -drowsiness -feeling warm, flushing, or redness of the face -headache -muscle cramps, pain -nausea, vomiting -unusually weak or tired This list may not describe all possible side effects. Call your doctor for medical advice about side effects. You may report side effects to FDA at 1-800-FDA-1088. Where should I keep my medicine? Keep out of the reach of children. Store at room temperature between 2 and 30 degrees C (36 and 86 degrees F). Throw away any unused medicine after the expiration date. NOTE: This sheet is a summary. It may not cover all possible information. If you have questions about this medicine, talk to your doctor, pharmacist, or health care provider.  2014, Elsevier/Gold Standard. (2013-03-09 10:12:47) Sleep Apnea  Sleep apnea is a sleep disorder characterized  by abnormal pauses in breathing while you sleep. When your breathing pauses, the level of oxygen in your blood decreases. This causes you to move out of deep sleep and into light sleep. As a result, your quality of sleep is poor, and the system that carries your blood throughout your body (cardiovascular system) experiences stress. If sleep apnea remains untreated, the following conditions can develop:  High blood pressure (hypertension).  Coronary artery disease.  Inability to achieve or maintain an erection (impotence).  Impairment of your thought process (cognitive dysfunction). There are three types of sleep apnea: 1. Obstructive sleep apnea Pauses in breathing during sleep because of a blocked airway. 2. Central sleep apnea Pauses in breathing during sleep because the area of the brain that controls your breathing does not send the correct signals to the muscles that control breathing. 3. Mixed sleep apnea A combination of both obstructive and central sleep apnea. RISK FACTORS The following risk factors can increase your risk of developing sleep apnea:  Being overweight.  Smoking.  Having narrow passages in your nose and throat.  Being of older age.  Being female.  Alcohol use.  Sedative and tranquilizer use.  Ethnicity. Among individuals younger than 35 years, African Americans are at increased risk of sleep apnea. SYMPTOMS   Difficulty staying asleep.  Daytime sleepiness and fatigue.  Loss of energy.  Irritability.  Loud, heavy snoring.  Morning headaches.  Trouble concentrating.  Forgetfulness.  Decreased interest in sex. DIAGNOSIS  In order to diagnose sleep apnea, your caregiver will perform  a physical examination. Your caregiver may suggest that you take a home sleep test. Your caregiver may also recommend that you spend the night in a sleep lab. In the sleep lab, several monitors record information about your heart, lungs, and brain while you sleep. Your  leg and arm movements and blood oxygen level are also recorded. TREATMENT The following actions may help to resolve mild sleep apnea:  Sleeping on your side.   Using a decongestant if you have nasal congestion.   Avoiding the use of depressants, including alcohol, sedatives, and narcotics.   Losing weight and modifying your diet if you are overweight. There also are devices and treatments to help open your airway:  Oral appliances. These are custom-made mouthpieces that shift your lower jaw forward and slightly open your bite. This opens your airway.  Devices that create positive airway pressure. This positive pressure "splints" your airway open to help you breathe better during sleep. The following devices create positive airway pressure:  Continuous positive airway pressure (CPAP) device. The CPAP device creates a continuous level of air pressure with an air pump. The air is delivered to your airway through a mask while you sleep. This continuous pressure keeps your airway open.  Nasal expiratory positive airway pressure (EPAP) device. The EPAP device creates positive air pressure as you exhale. The device consists of single-use valves, which are inserted into each nostril and held in place by adhesive. The valves create very little resistance when you inhale but create much more resistance when you exhale. That increased resistance creates the positive airway pressure. This positive pressure while you exhale keeps your airway open, making it easier to breath when you inhale again.  Bilevel positive airway pressure (BPAP) device. The BPAP device is used mainly in patients with central sleep apnea. This device is similar to the CPAP device because it also uses an air pump to deliver continuous air pressure through a mask. However, with the BPAP machine, the pressure is set at two different levels. The pressure when you exhale is lower than the pressure when you inhale.  Surgery. Typically,  surgery is only done if you cannot comply with less invasive treatments or if the less invasive treatments do not improve your condition. Surgery involves removing excess tissue in your airway to create a wider passage way. Document Released: 06/28/2002 Document Revised: 11/02/2012 Document Reviewed: 11/14/2011 [facility] Patient Information 2014 [place], [place]. Levonorgestrel intrauterine device (IUD) What is this medicine? LEVONORGESTREL IUD (LEE voe nor jes trel) is a contraceptive (birth control) device. The device is placed inside the uterus by a healthcare professional. It is used to prevent pregnancy and can also be used to treat heavy bleeding that occurs during your period. Depending on the device, it can be used for 3 to 5 years. This medicine may be used for other purposes; ask your health care provider or pharmacist if you have questions. COMMON BRAND NAME(S): Gretta Cool What should I tell my health care provider before I take this medicine? They need to know if you have any of these conditions: -abnormal Pap smear -cancer of the breast, uterus, or cervix -diabetes -endometritis -genital or pelvic infection now or in the past -have more than one sexual partner or your partner has more than one partner -heart disease -history of an ectopic or tubal pregnancy -immune system problems -IUD in place -liver disease or tumor -problems with blood clots or take blood-thinners -use intravenous drugs -uterus of unusual shape -vaginal bleeding that has not  been explained -an unusual or allergic reaction to levonorgestrel, other hormones, silicone, or polyethylene, medicines, foods, dyes, or preservatives -pregnant or trying to get pregnant -breast-feeding How should I use this medicine? This device is placed inside the uterus by a health care professional. Talk to your pediatrician regarding the use of this medicine in children. Special care may be needed. Overdosage: If you think  you have taken too much of this medicine contact a poison control center or emergency room at once. NOTE: This medicine is only for you. Do not share this medicine with others. What if I miss a dose? This does not apply. What may interact with this medicine? Do not take this medicine with any of the following medications: -amprenavir -bosentan -fosamprenavir This medicine may also interact with the following medications: -aprepitant -barbiturate medicines for inducing sleep or treating seizures -bexarotene -griseofulvin -medicines to treat seizures like carbamazepine, ethotoin, felbamate, oxcarbazepine, phenytoin, topiramate -modafinil -pioglitazone -rifabutin -rifampin -rifapentine -some medicines to treat HIV infection like atazanavir, indinavir, lopinavir, nelfinavir, tipranavir, ritonavir -St. John's wort -warfarin This list may not describe all possible interactions. Give your health care provider a list of all the medicines, herbs, non-prescription drugs, or dietary supplements you use. Also tell them if you smoke, drink alcohol, or use illegal drugs. Some items may interact with your medicine. What should I watch for while using this medicine? Visit your doctor or health care professional for regular check ups. See your doctor if you or your partner has sexual contact with others, becomes HIV positive, or gets a sexual transmitted disease. This product does not protect you against HIV infection (AIDS) or other sexually transmitted diseases. You can check the placement of the IUD yourself by reaching up to the top of your vagina with clean fingers to feel the threads. Do not pull on the threads. It is a good habit to check placement after each menstrual period. Call your doctor right away if you feel more of the IUD than just the threads or if you cannot feel the threads at all. The IUD may come out by itself. You may become pregnant if the device comes out. If you notice that the  IUD has come out use a backup birth control method like condoms and call your health care provider. Using tampons will not change the position of the IUD and are okay to use during your period. What side effects may I notice from receiving this medicine? Side effects that you should report to your doctor or health care professional as soon as possible: -allergic reactions like skin rash, itching or hives, swelling of the face, lips, or tongue -fever, flu-like symptoms -genital sores -high blood pressure -no menstrual period for 6 weeks during use -pain, swelling, warmth in the leg -pelvic pain or tenderness -severe or sudden headache -signs of pregnancy -stomach cramping -sudden shortness of breath -trouble with balance, talking, or walking -unusual vaginal bleeding, discharge -yellowing of the eyes or skin Side effects that usually do not require medical attention (report to your doctor or health care professional if they continue or are bothersome): -acne -breast pain -change in sex drive or performance -changes in weight -cramping, dizziness, or faintness while the device is being inserted -headache -irregular menstrual bleeding within first 3 to 6 months of use -nausea This list may not describe all possible side effects. Call your doctor for medical advice about side effects. You may report side effects to FDA at 1-800-FDA-1088. Where should I keep my medicine? This does  not apply. NOTE: This sheet is a summary. It may not cover all possible information. If you have questions about this medicine, talk to your doctor, pharmacist, or health care provider.  2014, Elsevier/Gold Standard. (2011-08-08 13:54:04)

## 2013-08-03 NOTE — Progress Notes (Signed)
Subjective:     Patient ID: [name] [name], female   DOB: January 10, 1986, 28 y.o.   MRN: [id number]  Headache  This is a chronic problem. Episode onset: 6 months. The problem occurs daily. The problem has been gradually worsening. The pain is located in the left unilateral region. Radiates to: to other areas of her head. The pain quality is not similar to prior headaches (worsened in the last 6 months). The quality of the pain is described as sharp. The pain is at a severity of 6/10. The pain is moderate. Associated symptoms include nausea. Pertinent negatives include no abdominal pain, blurred vision, coughing, dizziness, eye pain, facial sweating, numbness, phonophobia, photophobia, scalp tenderness, seizures, sinus pressure, tingling, vomiting or weakness. Associated symptoms comments: She does have some black dots she has been seeing even before the headaches got worse, but now, it's more noticeable. The symptoms are aggravated by bright light. Treatments tried: fiorcet. The treatment provided mild (some relief but never really goes completely away) relief. Her past medical history is significant for migraine headaches, obesity and sinus disease. (She does have hx of nasal turbinate surgery when she was 28 years old)   She has been seen for the headaches in the past. She was placed on fiorcet for this and labs were done. Imaging was considered as well. She says the fiorcet helped a great deal but the headache would come right back and stronger in intensity it seemed like. She says she has had headaches in teh past and usually, she doesn't have to take anything in order to relieve the headache. She can continue doing her daily activities despite having a headache. Now, she says they pretty much make her lay down in a dark room. They occur mostly on the left temple area and may radiate to other parts of her head. They appear to be worse upon first waking up in the morning time. She use to have migraines as a  teenager, mostly before her periods. She does also report a hx of amenorrhea and irregular periods as well. She does have the Mirena in and was placed in Sept 2014. She says she use to be on the depo shot before that time, but got off the shot because of an 80 pound weight gain.    Past Medical History  Diagnosis Date  . Abnormal Pap smear   . Anxiety   . Depression   . Bipolar 1 disorder   . UTI (urinary tract infection)   . Pregnancy   . Kidney infection   . IUD (intrauterine device) in place 04/12/2013    Had IUD inserted 03/29/13 could not feel strings went to ER 9/18 and KUB saw IUD still could not see string, can't see now but seen in [place]   Current Outpatient Prescriptions on File Prior to Visit  Medication Sig Dispense Refill  . albuterol (PROVENTIL HFA;VENTOLIN HFA) 108 (90 BASE) MCG/ACT inhaler Inhale 2 puffs into the lungs every 6 (six) hours as needed for wheezing or shortness of breath.  1 Inhaler  0  . levonorgestrel (MIRENA) 20 MCG/24HR IUD 1 each by Intrauterine route once.      Marland Kitchen Spacer/Aero-Holding Chambers (AEROCHAMBER PLUS) inhaler Use as instructed  1 each  2  . loratadine (CLARITIN) 10 MG tablet Take 1 tablet (10 mg total) by mouth daily.  30 tablet  11   No current facility-administered medications on file prior to visit.   Allergies  Allergen Reactions  . Shellfish Allergy Anaphylaxis  Throat swelling   Past Surgical History  Procedure Laterality Date  . Tooth extraction    . Cesarean section          Nasal surgery at the age of 9    Review of Systems  Constitutional: Positive for fatigue. Negative for chills, activity change, appetite change and unexpected weight change.  HENT: Negative for sinus pressure.   Eyes: Negative for blurred vision, photophobia and pain.  Respiratory: Negative for cough, shortness of breath and wheezing.   Cardiovascular: Negative for chest pain and palpitations.  Gastrointestinal: Positive for nausea. Negative for vomiting,  abdominal pain, diarrhea and constipation.       Nausea during her headaches  Genitourinary: Positive for menstrual problem. Negative for dysuria, hematuria and flank pain.  Allergic/Immunologic: Positive for environmental allergies. Negative for immunocompromised state.  Neurological: Positive for headaches. Negative for dizziness, tingling, seizures, syncope, speech difficulty, weakness, light-headedness and numbness.  Psychiatric/Behavioral: Positive for sleep disturbance. Negative for agitation.       Snoring       Objective:   Physical Exam  Nursing note and vitals reviewed. Constitutional: She is oriented to person, place, and time.  obesed AAF in NAD   HENT:  Head: Normocephalic and atraumatic.  Right Ear: External ear normal.  Left Ear: External ear normal.  Nose: Nose normal.  Mouth/Throat: Oropharynx is clear and moist.  Mallampati score IV-unable to visualize the posterior palate or uvula with patient opening mouth  Eyes: Conjunctivae and EOM are normal. Pupils are equal, round, and reactive to light.  Neck: Normal range of motion. Neck supple. No thyromegaly present.  Cardiovascular: Normal rate, regular rhythm, normal heart sounds and intact distal pulses.   Pulmonary/Chest: Effort normal and breath sounds normal. No respiratory distress. She has no wheezes.  Abdominal: Soft. Bowel sounds are normal. She exhibits no distension. There is no tenderness.  Neurological: She is alert and oriented to person, place, and time. She has normal reflexes.  Skin: Skin is warm and dry.  Psychiatric: She has a normal mood and affect. Her behavior is normal. Thought content normal.       Assessment:     [name] [name] was seen today for headache and dizziness.  Diagnoses and associated orders for this visit:  Persistent headaches - Nocturnal polysomnography (NPSG); Future - naproxen (NAPROSYN) 500 MG tablet; Take 1 tab po every 12 hours as needed for headaches, with  food - SUMAtriptan (IMITREX) 50 MG tablet; May repeat in 2 hours if headache persists or recurs.  Obesity, morbid, BMI 50 or higher - Nocturnal polysomnography (NPSG); Future  Snoring - Nocturnal polysomnography (NPSG); Future       Plan:     Due to headaches worse in the morning, hx of snoring per what her husband says, and Mallampati score IV, will schedule sleep study. Likely has a component of sleep apnea which may be making the headaches worse.   She also has an IUD in place and this could also be causing her headaches; esp the fact that she says the headaches never really go away completely. (likely due to the continuous release of hormones from the IUD. May also be explaining her nausea as well) Will try naproxen and imitrex prn for now and have her do the sleep test. Follow up after the sleep test or in 2 weeks if sleep test can't be done by 2 weeks.   Have discussed weight management and weight loss in order to improve sleep apnea symptoms.  Also need  to think about Pseudotumor cerebrii in this patient, but she doesn't have any vomiting, eye pain with movement, or changes in eye vision.  Will keep this on differential as well. To follow up in 2 weeks or sooner if condition worsens.

## 2013-08-05 ENCOUNTER — Other Ambulatory Visit: Payer: Self-pay | Admitting: Family Medicine

## 2013-08-05 ENCOUNTER — Telehealth: Payer: Self-pay | Admitting: Family Medicine

## 2013-08-05 DIAGNOSIS — R0683 Snoring: Secondary | ICD-10-CM

## 2013-08-05 DIAGNOSIS — R519 Headache, unspecified: Secondary | ICD-10-CM

## 2013-08-05 DIAGNOSIS — R51 Headache: Principal | ICD-10-CM

## 2013-08-05 NOTE — Telephone Encounter (Signed)
So it's ordered. By drop order, do you mean release?

## 2013-08-05 NOTE — Telephone Encounter (Signed)
plz drop order for sleep study

## 2013-08-06 ENCOUNTER — Telehealth (HOSPITAL_COMMUNITY): Payer: Self-pay | Admitting: Dietician

## 2013-08-06 NOTE — Telephone Encounter (Signed)
Pt was a no-show for appointment scheduled for 08/06/2013 at 0900. Sent letter to pt home notifying pt of no-show and requesting rescheduling appointment.

## 2013-08-11 ENCOUNTER — Encounter: Payer: Self-pay | Admitting: Neurology

## 2013-08-11 ENCOUNTER — Ambulatory Visit: Payer: Medicaid Other | Attending: Family Medicine | Admitting: Sleep Medicine

## 2013-08-11 DIAGNOSIS — R0683 Snoring: Secondary | ICD-10-CM

## 2013-08-11 DIAGNOSIS — R519 Headache, unspecified: Secondary | ICD-10-CM

## 2013-08-11 DIAGNOSIS — G4733 Obstructive sleep apnea (adult) (pediatric): Secondary | ICD-10-CM | POA: Insufficient documentation

## 2013-08-11 DIAGNOSIS — R51 Headache: Secondary | ICD-10-CM

## 2013-08-21 NOTE — Procedures (Signed)
  [facility] [name] Ryser A. Gerilyn Pilgrimoonquah, MD     [email or web address]          LOCATION: SLEEP LAB FACILITY: [facility]  PHYSICIAN: Tanja Gift A. Gerilyn [name], M.D.   DATE OF STUDY: 08/11/2013  NOCTURNAL POLYSOMNOGRAM   REFERRING PHYSICIAN: Alethea Barriono.  INDICATIONS: This is a 28 year old lady who presents with hypersomnia and loud snoring.  MEDICATIONS:  Prior to Admission medications   Medication Sig Start Date End Date Taking? Authorizing Provider  albuterol (PROVENTIL HFA;VENTOLIN HFA) 108 (90 BASE) MCG/ACT inhaler Inhale 2 puffs into the lungs every 6 (six) hours as needed for wheezing or shortness of breath. 06/14/13   Acey LavAllison L Wood, MD  levonorgestrel (MIRENA) 20 MCG/24HR IUD 1 each by Intrauterine route once.    Historical Provider, MD  loratadine (CLARITIN) 10 MG tablet Take 1 tablet (10 mg total) by mouth daily. 05/24/13   Acey LavAllison L Wood, MD  naproxen (NAPROSYN) 500 MG tablet Take 1 tab po every 12 hours as needed for headaches, with food 08/03/13   Kela MillinAlethea Y Barrino, MD  Spacer/Aero-Holding Chambers (AEROCHAMBER PLUS) inhaler Use as instructed 06/14/13   Acey LavAllison L Wood, MD  SUMAtriptan (IMITREX) 50 MG tablet May repeat in 2 hours if headache persists or recurs. 08/03/13   Kela MillinAlethea Y Barrino, MD      EPWORTH SLEEPINESS SCALE: 10.   BMI: 56.   ARCHITECTURAL SUMMARY: Total recording time was 375 minutes. Sleep efficiency 84%. Sleep latency 9.5 minutes. REM latency 65 minutes. Stage NI 5.1%, N2 54% and N3 15% and REM sleep 26%.    RESPIRATORY DATA:  Baseline oxygen saturation is 98%. The lowest saturation is 87%. The diagnostic AHI is 10. The RDI is 12. The REM AHI is 36.  LIMB MOVEMENT SUMMARY: PLM index 0.   ELECTROCARDIOGRAM SUMMARY: Average heart rate is 84  with no significant  dysrhythmias observed.   IMPRESSION:  1. 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 We recommend formal CPAP titration recording.  Thanks for this referral.  [name] A. Gerilyn [name],  M.D. Diplomat, Biomedical engineerAmerican Board of Sleep Medicine.

## 2013-09-21 ENCOUNTER — Ambulatory Visit: Payer: Medicaid Other | Admitting: Family Medicine

## 2014-01-01 ENCOUNTER — Emergency Department (HOSPITAL_COMMUNITY)
Admission: EM | Admit: 2014-01-01 | Discharge: 2014-01-02 | Disposition: A | Discharge status: Home / Self Care | Payer: Medicaid Other | Attending: Emergency Medicine | Admitting: Emergency Medicine

## 2014-01-01 ENCOUNTER — Encounter (HOSPITAL_COMMUNITY): Payer: Self-pay | Admitting: Emergency Medicine

## 2014-01-01 DIAGNOSIS — Z791 Long term (current) use of non-steroidal anti-inflammatories (NSAID): Secondary | ICD-10-CM | POA: Insufficient documentation

## 2014-01-01 DIAGNOSIS — X131XXA Other contact with steam and other hot vapors, initial encounter: Secondary | ICD-10-CM

## 2014-01-01 DIAGNOSIS — Y929 Unspecified place or not applicable: Secondary | ICD-10-CM | POA: Insufficient documentation

## 2014-01-01 DIAGNOSIS — Z8659 Personal history of other mental and behavioral disorders: Secondary | ICD-10-CM | POA: Insufficient documentation

## 2014-01-01 DIAGNOSIS — Z87440 Personal history of urinary (tract) infections: Secondary | ICD-10-CM | POA: Insufficient documentation

## 2014-01-01 DIAGNOSIS — Z87891 Personal history of nicotine dependence: Secondary | ICD-10-CM | POA: Insufficient documentation

## 2014-01-01 DIAGNOSIS — X12XXXA Contact with other hot fluids, initial encounter: Secondary | ICD-10-CM | POA: Insufficient documentation

## 2014-01-01 DIAGNOSIS — T2122XA Burn of second degree of abdominal wall, initial encounter: Secondary | ICD-10-CM | POA: Insufficient documentation

## 2014-01-01 DIAGNOSIS — Y93G9 Activity, other involving cooking and grilling: Secondary | ICD-10-CM | POA: Insufficient documentation

## 2014-01-01 DIAGNOSIS — Z975 Presence of (intrauterine) contraceptive device: Secondary | ICD-10-CM | POA: Insufficient documentation

## 2014-01-01 DIAGNOSIS — Z79899 Other long term (current) drug therapy: Secondary | ICD-10-CM | POA: Insufficient documentation
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
## 2014-01-02 MED ORDER — IBUPROFEN 400 MG PO TABS
600.0000 mg | ORAL_TABLET | Freq: Once | ORAL | Status: AC
  Administered 2014-01-02: 600 mg via ORAL
  Filled 2014-01-02: qty 2

## 2014-01-02 MED ORDER — SILVER SULFADIAZINE 1 % EX CREA: 1.0000 "application " | TOPICAL_CREAM | Freq: Every day | CUTANEOUS | Status: DC

## 2014-01-02 MED ORDER — HYDROCODONE-ACETAMINOPHEN 5-325 MG PO TABS: 1.0000 | ORAL_TABLET | Freq: Four times a day (QID) | ORAL | Status: DC | PRN

## 2014-01-02 MED ORDER — HYDROCODONE-ACETAMINOPHEN 5-325 MG PO TABS
2.0000 | ORAL_TABLET | Freq: Once | ORAL | Status: AC
  Administered 2014-01-02: 2 via ORAL
  Filled 2014-01-02: qty 2

## 2014-01-02 MED ORDER — SILVER SULFADIAZINE 1 % EX CREA
TOPICAL_CREAM | Freq: Once | CUTANEOUS | Status: AC
  Administered 2014-01-02: 1 via TOPICAL
  Filled 2014-01-02: qty 50

## 2014-01-02 NOTE — Discharge Instructions (Signed)

## 2014-01-02 NOTE — ED Notes (Signed)
Wound care complete

## 2014-01-02 NOTE — ED Notes (Signed)
Patient given discharge instruction, verbalized understand. Patient ambulatory out of the department.  

## 2014-05-11 ENCOUNTER — Emergency Department (HOSPITAL_COMMUNITY)
Admission: EM | Admit: 2014-05-11 | Discharge: 2014-05-11 | Disposition: A | Discharge status: Home / Self Care | Payer: Medicaid Other | Attending: Emergency Medicine | Admitting: Emergency Medicine

## 2014-05-11 ENCOUNTER — Encounter (HOSPITAL_COMMUNITY): Payer: Self-pay | Admitting: Emergency Medicine

## 2014-05-11 DIAGNOSIS — Z79899 Other long term (current) drug therapy: Secondary | ICD-10-CM | POA: Diagnosis not present

## 2014-05-11 DIAGNOSIS — Z3202 Encounter for pregnancy test, result negative: Secondary | ICD-10-CM | POA: Insufficient documentation

## 2014-05-11 DIAGNOSIS — N390 Urinary tract infection, site not specified: Secondary | ICD-10-CM | POA: Diagnosis not present

## 2014-05-11 DIAGNOSIS — Z8659 Personal history of other mental and behavioral disorders: Secondary | ICD-10-CM | POA: Insufficient documentation

## 2014-05-11 DIAGNOSIS — Z87891 Personal history of nicotine dependence: Secondary | ICD-10-CM | POA: Diagnosis not present

## 2014-05-11 DIAGNOSIS — R3915 Urgency of urination: Secondary | ICD-10-CM | POA: Diagnosis present

## 2014-05-11 LAB — URINE MICROSCOPIC-ADD ON

## 2014-05-11 LAB — URINALYSIS, ROUTINE W REFLEX MICROSCOPIC
Bilirubin Urine: NEGATIVE
GLUCOSE, UA: NEGATIVE mg/dL
Ketones, ur: NEGATIVE mg/dL
NITRITE: NEGATIVE
PH: 6 (ref 5.0–8.0)
Protein, ur: 30 mg/dL — AB
Specific Gravity, Urine: 1.025 (ref 1.005–1.030)
Urobilinogen, UA: 0.2 mg/dL (ref 0.0–1.0)

## 2014-05-11 LAB — PREGNANCY, URINE: PREG TEST UR: NEGATIVE

## 2014-05-11 MED ORDER — NITROFURANTOIN MACROCRYSTAL 100 MG PO CAPS
100.0000 mg | ORAL_CAPSULE | Freq: Once | ORAL | Status: AC
  Administered 2014-05-11: 100 mg via ORAL
  Filled 2014-05-11: qty 1

## 2014-05-11 MED ORDER — NITROFURANTOIN MONOHYD MACRO 100 MG PO CAPS: 100.0000 mg | ORAL_CAPSULE | Freq: Two times a day (BID) | ORAL | Status: DC

## 2014-05-11 MED ORDER — OXYCODONE-ACETAMINOPHEN 5-325 MG PO TABS: 1.0000 | ORAL_TABLET | ORAL | Status: DC | PRN

## 2014-05-11 MED ORDER — NITROFURANTOIN MACROCRYSTAL 50 MG PO CAPS
ORAL_CAPSULE | ORAL | Status: AC
  Filled 2014-05-11: qty 2

## 2014-05-11 MED ORDER — OXYCODONE-ACETAMINOPHEN 5-325 MG PO TABS
1.0000 | ORAL_TABLET | Freq: Once | ORAL | Status: AC
  Administered 2014-05-11: 1 via ORAL
  Filled 2014-05-11: qty 1

## 2014-05-11 NOTE — ED Notes (Signed)
Patient dysuria x 4 days.

## 2014-05-11 NOTE — ED Provider Notes (Signed)
CSN: [id number]     Arrival date & time 05/11/14  0220 History   First MD Initiated Contact with Patient 05/11/14 0231     Chief Complaint  Patient presents with  . Urinary Tract Infection     (Consider location/radiation/quality/duration/timing/severity/associated sxs/prior Treatment) Patient is a 28 y.o. female presenting with urinary tract infection. The history is provided by the patient.  Urinary Tract Infection  She complains of a four-day history of urinary urgency, frequency, tenesmus, and dysuria with associated lower back pain. She rates her back pain it is 7/10. She denies fever, chills, sweats. There has been no nausea or vomiting or diarrhea. She has a history of frequent urinary tract infections and this feels like prior UTIs. She's tried a variety of over-the-counter measures without any relief.  Past Medical History  Diagnosis Date  . Abnormal Pap smear   . Anxiety   . Depression   . Bipolar 1 disorder   . UTI (urinary tract infection)   . Pregnancy   . Kidney infection   . IUD (intrauterine device) in place 04/12/2013    Had IUD inserted 03/29/13 could not feel strings went to ER 9/18 and KUB saw IUD still could not see string, can't see now but seen in US   Past Surgical History  Procedure Laterality Date  . Tooth extraction    . Cesarean section     Family History  Problem Relation Age of Onset  . Hypertension Father   . Diabetes Father   . Diabetes Paternal Aunt   . Diabetes Paternal Uncle   . Hypertension Paternal Grandmother   . Diabetes Paternal Grandmother   . Diabetes Paternal Grandfather    History  Substance Use Topics  . Smoking status: Former Smoker    Types: Cigarettes    Quit date: 05/21/2007  . Smokeless tobacco: Never Used  . Alcohol Use: No     Comment: occ.    OB History   Grav Para Term Preterm Abortions TAB SAB Ect Mult Living   4 2 1 1 2  0 2 0 0 2     Review of Systems  All other systems reviewed and are  negative.     Allergies  Shellfish allergy  Home Medications   Prior to Admission medications   Medication Sig Start Date End Date Taking? Authorizing Provider  albuterol (PROVENTIL HFA;VENTOLIN HFA) 108 (90 BASE) MCG/ACT inhaler Inhale 2 puffs into the lungs every 6 (six) hours as needed for wheezing or shortness of breath. 06/14/13  Yes Acey LavAllison L Wood, MD  levonorgestrel (MIRENA) 20 MCG/24HR IUD 1 each by Intrauterine route once.   Yes Historical Provider, MD  naproxen (NAPROSYN) 500 MG tablet Take 1 tab po every 12 hours as needed for headaches, with food 08/03/13  Yes Kela MillinAlethea Y Barrino, MD  Spacer/Aero-Holding Chambers (AEROCHAMBER PLUS) inhaler Use as instructed 06/14/13  Yes Acey LavAllison L Wood, MD  HYDROcodone-acetaminophen (NORCO/VICODIN) 5-325 MG per tablet Take 1 tablet by mouth every 6 (six) hours as needed. 01/02/14   Derwood KaplanAnkit Nanavati, MD  loratadine (CLARITIN) 10 MG tablet Take 1 tablet (10 mg total) by mouth daily. 05/24/13   Acey LavAllison L Wood, MD  silver sulfADIAZINE (SILVADENE) 1 % cream Apply 1 application topically daily. 01/02/14   Derwood KaplanAnkit Nanavati, MD  SUMAtriptan (IMITREX) 50 MG tablet May repeat in 2 hours if headache persists or recurs. 08/03/13   Kela MillinAlethea Y Barrino, MD   BP 100/60  Pulse 106  Temp(Src) 97.7 F (36.5 C) (Oral)  Resp  18  Ht 5\' 7"  (1.702 m)  Wt 380 lb (172.367 kg)  BMI 59.50 kg/m2  SpO2 98% Physical Exam  Nursing note and vitals reviewed.  Morbidly obese 28 year old female, resting comfortably and in no acute distress. Vital signs are significant for mild tachycardia. Oxygen saturation is 98%, which is normal. Head is normocephalic and atraumatic. PERRLA, EOMI. Oropharynx is clear. Neck is nontender and supple without adenopathy or JVD. Back is nontender and there is no CVA tenderness. Lungs are clear without rales, wheezes, or rhonchi. Chest is nontender. Heart has regular rate and rhythm without murmur. Abdomen is soft, flat, nontender without masses or  hepatosplenomegaly and peristalsis is normoactive. Extremities have no cyanosis or edema, full range of motion is present. Skin is warm and dry without rash. Neurologic: Mental status is normal, cranial nerves are intact, there are no motor or sensory deficits.  ED Course  Procedures (including critical care time) Labs Review Results for orders placed during the hospital encounter of 05/11/14  URINALYSIS, ROUTINE W REFLEX MICROSCOPIC      Result Value Ref Range   Color, Urine YELLOW  YELLOW   APPearance HAZY (*) CLEAR   Specific Gravity, Urine 1.025  1.005 - 1.030   pH 6.0  5.0 - 8.0   Glucose, UA NEGATIVE  NEGATIVE mg/dL   Hgb urine dipstick SMALL (*) NEGATIVE   Bilirubin Urine NEGATIVE  NEGATIVE   Ketones, ur NEGATIVE  NEGATIVE mg/dL   Protein, ur 30 (*) NEGATIVE mg/dL   Urobilinogen, UA 0.2  0.0 - 1.0 mg/dL   Nitrite NEGATIVE  NEGATIVE   Leukocytes, UA SMALL (*) NEGATIVE  PREGNANCY, URINE      Result Value Ref Range   Preg Test, Ur NEGATIVE  NEGATIVE  URINE MICROSCOPIC-ADD ON      Result Value Ref Range   Squamous Epithelial / LPF MANY (*) RARE   WBC, UA TOO NUMEROUS TO COUNT  <3 WBC/hpf   RBC / HPF 0-2  <3 RBC/hpf   Bacteria, UA MANY (*) RARE    MDM   Final diagnoses:  Urinary tract infection without hematuria, site unspecified    Probable UTI. Urinalysis has been sent. Old records are reviewed and she has several ED visits for UTIs and also hospital admission for pyelonephritis-however, last ED visit for UTI was 2 years ago.  Urinalysis confirms presence of urinary tract infection. Urine is sent for culture. She is discharged with prescription for nitrofurantoin and is also given a take-home pack of oxycodone and acetaminophen.  Dione Boozeavid Syana Degraffenreid, MD 05/11/14 [phone]

## 2014-05-11 NOTE — Discharge Instructions (Signed)
Urinary Tract Infection °Urinary tract infections (UTIs) can develop anywhere along your urinary tract. Your urinary tract is your body's drainage system for removing wastes and extra water. Your urinary tract includes two kidneys, two ureters, a bladder, and a urethra. Your kidneys are a pair of bean-shaped organs. Each kidney is about the size of your fist. They are located below your ribs, one on each side of your spine. °CAUSES °Infections are caused by microbes, which are microscopic organisms, including fungi, viruses, and bacteria. These organisms are so small that they can only be seen through a microscope. Bacteria are the microbes that most commonly cause UTIs. °SYMPTOMS  °Symptoms of UTIs may vary by age and gender of the patient and by the location of the infection. Symptoms in young women typically include a frequent and intense urge to urinate and a painful, burning feeling in the bladder or urethra during urination. Older women and men are more likely to be tired, shaky, and weak and have muscle aches and abdominal pain. A fever may mean the infection is in your kidneys. Other symptoms of a kidney infection include pain in your back or sides below the ribs, nausea, and vomiting. °DIAGNOSIS °To diagnose a UTI, your caregiver will ask you about your symptoms. Your caregiver also will ask to provide a urine sample. The urine sample will be tested for bacteria and white blood cells. White blood cells are made by your body to help fight infection. °TREATMENT  °Typically, UTIs can be treated with medication. Because most UTIs are caused by a bacterial infection, they usually can be treated with the use of antibiotics. The choice of antibiotic and length of treatment depend on your symptoms and the type of bacteria causing your infection. °HOME CARE INSTRUCTIONS °· If you were prescribed antibiotics, take them exactly as your caregiver instructs you. Finish the medication even if you feel better after you  have only taken some of the medication. °· Drink enough water and fluids to keep your urine clear or pale yellow. °· Avoid caffeine, tea, and carbonated beverages. They tend to irritate your bladder. °· Empty your bladder often. Avoid holding urine for long periods of time. °· Empty your bladder before and after sexual intercourse. °· After a bowel movement, women should cleanse from front to back. Use each tissue only once. °SEEK MEDICAL CARE IF:  °· You have back pain. °· You develop a fever. °· Your symptoms do not begin to resolve within 3 days. °SEEK IMMEDIATE MEDICAL CARE IF:  °· You have severe back pain or lower abdominal pain. °· You develop chills. °· You have nausea or vomiting. °· You have continued burning or discomfort with urination. °MAKE SURE YOU:  °· Understand these instructions. °· Will watch your condition. °· Will get help right away if you are not doing well or get worse. °Document Released: 04/17/2005 Document Revised: 01/07/2012 Document Reviewed: 08/16/2011 °[employer]® Patient Information ©2015 [employer]. This information is not intended to replace advice given to you by your health care provider. Make sure you discuss any questions you have with your health care provider. ° °Nitrofurantoin tablets or capsules °What is this medicine? °NITROFURANTOIN (nye troe fyoor AN toyn) is an antibiotic. It is used to treat urinary tract infections. °This medicine may be used for other purposes; ask your health care provider or pharmacist if you have questions. °COMMON BRAND NAME(S): Macrobid, Macrodantin, Urotoin °What should I tell my health care provider before I take this medicine? °They need to   know if you have any of these conditions: -anemia -diabetes -glucose-6-phosphate dehydrogenase deficiency -kidney disease -liver disease -lung disease -other chronic illness -an unusual or allergic reaction to nitrofurantoin, other antibiotics, other medicines, foods, dyes or  preservatives -pregnant or trying to get pregnant -breast-feeding How should I use this medicine? Take this medicine by mouth with a glass of water. Follow the directions on the prescription label. Take this medicine with food or milk. Take your doses at regular intervals. Do not take your medicine more often than directed. Do not stop taking except on your doctor's advice. Talk to your pediatrician regarding the use of this medicine in children. While this drug may be prescribed for selected conditions, precautions do apply. Overdosage: If you think you have taken too much of this medicine contact a poison control center or emergency room at once. NOTE: This medicine is only for you. Do not share this medicine with others. What if I miss a dose? If you miss a dose, take it as soon as you can. If it is almost time for your next dose, take only that dose. Do not take double or extra doses. What may interact with this medicine? -antacids containing magnesium trisilicate -probenecid -quinolone antibiotics like ciprofloxacin, lomefloxacin, norfloxacin and ofloxacin -sulfinpyrazone This list may not describe all possible interactions. Give your health care provider a list of all the medicines, herbs, non-prescription drugs, or dietary supplements you use. Also tell them if you smoke, drink alcohol, or use illegal drugs. Some items may interact with your medicine. What should I watch for while using this medicine? Tell your doctor or health care professional if your symptoms do not improve or if you get new symptoms. Drink several glasses of water a day. If you are taking this medicine for a long time, visit your doctor for regular checks on your progress. If you are diabetic, you may get a false positive result for sugar in your urine with certain brands of urine tests. Check with your doctor. What side effects may I notice from receiving this medicine? Side effects that you should report to your  doctor or health care professional as soon as possible: -allergic reactions like skin rash or hives, swelling of the face, lips, or tongue -chest pain -cough -difficulty breathing -dizziness, drowsiness -fever or infection -joint aches or pains -pale or blue-tinted skin -redness, blistering, peeling or loosening of the skin, including inside the mouth -tingling, burning, pain, or numbness in hands or feet -unusual bleeding or bruising -unusually weak or tired -yellowing of eyes or skin Side effects that usually do not require medical attention (report to your doctor or health care professional if they continue or are bothersome): -dark urine -diarrhea -headache -loss of appetite -nausea or vomiting -temporary hair loss This list may not describe all possible side effects. Call your doctor for medical advice about side effects. You may report side effects to FDA at 1-800-FDA-1088. Where should I keep my medicine? Keep out of the reach of children. Store at room temperature between 15 and 30 degrees C (59 and 86 degrees F). Protect from light. Throw away any unused medicine after the expiration date. NOTE: This sheet is a summary. It may not cover all possible information. If you have questions about this medicine, talk to your doctor, pharmacist, or health care provider.  2015, [employer] Standard. (2008-01-27 15:56:47)  Acetaminophen; Oxycodone tablets What is this medicine? ACETAMINOPHEN; OXYCODONE (a set a MEE noe fen; ox i KOE done) is a pain reliever. It  is used to treat mild to moderate pain. This medicine may be used for other purposes; ask your health care provider or pharmacist if you have questions. COMMON BRAND NAME(S): Endocet, Magnacet, Narvox, Percocet, Perloxx, Primalev, Primlev, Roxicet, Xolox What should I tell my health care provider before I take this medicine? They need to know if you have any of these conditions: -brain tumor -Crohn's disease, inflammatory  bowel disease, or ulcerative colitis -drug abuse or addiction -head injury -heart or circulation problems -if you often drink alcohol -kidney disease or problems going to the bathroom -liver disease -lung disease, asthma, or breathing problems -an unusual or allergic reaction to acetaminophen, oxycodone, other opioid analgesics, other medicines, foods, dyes, or preservatives -pregnant or trying to get pregnant -breast-feeding How should I use this medicine? Take this medicine by mouth with a full glass of water. Follow the directions on the prescription label. Take your medicine at regular intervals. Do not take your medicine more often than directed. Talk to your pediatrician regarding the use of this medicine in children. Special care may be needed. Patients over 26 years old may have a stronger reaction and need a smaller dose. Overdosage: If you think you have taken too much of this medicine contact a poison control center or emergency room at once. NOTE: This medicine is only for you. Do not share this medicine with others. What if I miss a dose? If you miss a dose, take it as soon as you can. If it is almost time for your next dose, take only that dose. Do not take double or extra doses. What may interact with this medicine? -alcohol -antihistamines -barbiturates like amobarbital, butalbital, butabarbital, methohexital, pentobarbital, phenobarbital, thiopental, and secobarbital -benztropine -drugs for bladder problems like solifenacin, trospium, oxybutynin, tolterodine, hyoscyamine, and methscopolamine -drugs for breathing problems like ipratropium and tiotropium -drugs for certain stomach or intestine problems like propantheline, homatropine methylbromide, glycopyrrolate, atropine, belladonna, and dicyclomine -general anesthetics like etomidate, ketamine, nitrous oxide, propofol, desflurane, enflurane, halothane, isoflurane, and sevoflurane -medicines for depression, anxiety, or  psychotic disturbances -medicines for sleep -muscle relaxants -naltrexone -narcotic medicines (opiates) for pain -phenothiazines like perphenazine, thioridazine, chlorpromazine, mesoridazine, fluphenazine, prochlorperazine, promazine, and trifluoperazine -scopolamine -tramadol -trihexyphenidyl This list may not describe all possible interactions. Give your health care provider a list of all the medicines, herbs, non-prescription drugs, or dietary supplements you use. Also tell them if you smoke, drink alcohol, or use illegal drugs. Some items may interact with your medicine. What should I watch for while using this medicine? Tell your doctor or health care professional if your pain does not go away, if it gets worse, or if you have new or a different type of pain. You may develop tolerance to the medicine. Tolerance means that you will need a higher dose of the medication for pain relief. Tolerance is normal and is expected if you take this medicine for a long time. Do not suddenly stop taking your medicine because you may develop a severe reaction. Your body becomes used to the medicine. This does NOT mean you are addicted. Addiction is a behavior related to getting and using a drug for a non-medical reason. If you have pain, you have a medical reason to take pain medicine. Your doctor will tell you how much medicine to take. If your doctor wants you to stop the medicine, the dose will be slowly lowered over time to avoid any side effects. You may get drowsy or dizzy. Do not drive, use machinery, or do anything  that needs mental alertness until you know how this medicine affects you. Do not stand or sit up quickly, especially if you are an older patient. This reduces the risk of dizzy or fainting spells. Alcohol may interfere with the effect of this medicine. Avoid alcoholic drinks. There are different types of narcotic medicines (opiates) for pain. If you take more than one type at the same time, you  may have more side effects. Give your health care provider a list of all medicines you use. Your doctor will tell you how much medicine to take. Do not take more medicine than directed. Call emergency for help if you have problems breathing. The medicine will cause constipation. Try to have a bowel movement at least every 2 to 3 days. If you do not have a bowel movement for 3 days, call your doctor or health care professional. Do not take Tylenol (acetaminophen) or medicines that have acetaminophen with this medicine. Too much acetaminophen can be very dangerous. Many nonprescription medicines contain acetaminophen. Always read the labels carefully to avoid taking more acetaminophen. What side effects may I notice from receiving this medicine? Side effects that you should report to your doctor or health care professional as soon as possible: -allergic reactions like skin rash, itching or hives, swelling of the face, lips, or tongue -breathing difficulties, wheezing -confusion -light headedness or fainting spells -severe stomach pain -unusually weak or tired -yellowing of the skin or the whites of the eyes Side effects that usually do not require medical attention (report to your doctor or health care professional if they continue or are bothersome): -dizziness -drowsiness -nausea -vomiting This list may not describe all possible side effects. Call your doctor for medical advice about side effects. You may report side effects to FDA at 1-800-FDA-1088. Where should I keep my medicine? Keep out of the reach of children. This medicine can be abused. Keep your medicine in a safe place to protect it from theft. Do not share this medicine with anyone. Selling or giving away this medicine is dangerous and against the law. Store at room temperature between 20 and 25 degrees C (68 and 77 degrees F). Keep container tightly closed. Protect from light. This medicine may cause accidental overdose and death if  it is taken by other adults, children, or pets. Flush any unused medicine down the toilet to reduce the chance of harm. Do not use the medicine after the expiration date. NOTE: This sheet is a summary. It may not cover all possible information. If you have questions about this medicine, talk to your doctor, pharmacist, or health care provider.  2015, [employer] Standard. (2013-03-01 13:17:35)

## 2014-05-12 LAB — URINE CULTURE

## 2014-05-16 MED FILL — Oxycodone w/ Acetaminophen Tab 5-325 MG: ORAL | Qty: 6 | Status: AC

## 2014-05-23 ENCOUNTER — Encounter (HOSPITAL_COMMUNITY): Payer: Self-pay | Admitting: Emergency Medicine

## 2014-08-15 ENCOUNTER — Encounter (HOSPITAL_COMMUNITY): Payer: Self-pay | Admitting: *Deleted

## 2014-08-15 ENCOUNTER — Emergency Department (HOSPITAL_COMMUNITY)
Admission: EM | Admit: 2014-08-15 | Discharge: 2014-08-16 | Disposition: A | Discharge status: Home / Self Care | Payer: Medicaid Other | Attending: Emergency Medicine | Admitting: Emergency Medicine

## 2014-08-15 DIAGNOSIS — Z791 Long term (current) use of non-steroidal anti-inflammatories (NSAID): Secondary | ICD-10-CM | POA: Insufficient documentation

## 2014-08-15 DIAGNOSIS — N390 Urinary tract infection, site not specified: Secondary | ICD-10-CM | POA: Diagnosis not present

## 2014-08-15 DIAGNOSIS — Z3202 Encounter for pregnancy test, result negative: Secondary | ICD-10-CM | POA: Diagnosis not present

## 2014-08-15 DIAGNOSIS — Z87891 Personal history of nicotine dependence: Secondary | ICD-10-CM | POA: Insufficient documentation

## 2014-08-15 DIAGNOSIS — R300 Dysuria: Secondary | ICD-10-CM | POA: Diagnosis present

## 2014-08-15 DIAGNOSIS — Z79899 Other long term (current) drug therapy: Secondary | ICD-10-CM | POA: Insufficient documentation

## 2014-08-15 DIAGNOSIS — F319 Bipolar disorder, unspecified: Secondary | ICD-10-CM | POA: Insufficient documentation

## 2014-08-15 DIAGNOSIS — F419 Anxiety disorder, unspecified: Secondary | ICD-10-CM | POA: Diagnosis not present

## 2014-08-15 LAB — URINALYSIS, ROUTINE W REFLEX MICROSCOPIC
Bilirubin Urine: NEGATIVE
Glucose, UA: 500 mg/dL — AB
Ketones, ur: 15 mg/dL — AB
Nitrite: POSITIVE — AB
PH: 6.5 (ref 5.0–8.0)
Protein, ur: >300 mg/dL — AB
SPECIFIC GRAVITY, URINE: 1.025 (ref 1.005–1.030)
Urobilinogen, UA: >8 mg/dL — ABNORMAL HIGH (ref 0.0–1.0)

## 2014-08-15 LAB — URINE MICROSCOPIC-ADD ON

## 2014-08-15 NOTE — ED Notes (Signed)
Dysuria and frequency or urination. Has been taking AZO   otc  NO NVD    NO fever

## 2014-08-16 LAB — I-STAT CHEM 8, ED
BUN: 16 mg/dL (ref 6–23)
CHLORIDE: 105 mmol/L (ref 96–112)
Calcium, Ion: 1.18 mmol/L (ref 1.12–1.23)
Creatinine, Ser: 1 mg/dL (ref 0.50–1.10)
Glucose, Bld: 93 mg/dL (ref 70–99)
HCT: 42 % (ref 36.0–46.0)
Hemoglobin: 14.3 g/dL (ref 12.0–15.0)
Potassium: 3.8 mmol/L (ref 3.5–5.1)
Sodium: 144 mmol/L (ref 135–145)
TCO2: 23 mmol/L (ref 0–100)

## 2014-08-16 LAB — PREGNANCY, URINE: Preg Test, Ur: NEGATIVE

## 2014-08-16 MED ORDER — CEPHALEXIN 500 MG PO CAPS
1000.0000 mg | ORAL_CAPSULE | Freq: Once | ORAL | Status: AC
  Administered 2014-08-16: 1000 mg via ORAL
  Filled 2014-08-16: qty 2

## 2014-08-16 MED ORDER — CEPHALEXIN 500 MG PO CAPS: 1000.0000 mg | ORAL_CAPSULE | Freq: Two times a day (BID) | ORAL | Status: DC

## 2014-08-16 NOTE — ED Provider Notes (Signed)
CSN: [id number]     Arrival date & time 08/15/14  2250 History   First MD Initiated Contact with Patient 08/16/14 0013     Chief Complaint  Patient presents with  . Dysuria     (Consider location/radiation/quality/duration/timing/severity/associated sxs/prior Treatment) Patient is a 29 y.o. female presenting with dysuria. The history is provided by the patient.  Dysuria Pain quality:  Burning Pain severity:  Severe Onset quality:  Gradual Duration:  2 days Timing:  Intermittent (with urination only) Progression:  Unchanged Chronicity:  Recurrent Recent urinary tract infections: yes (reports frequent uti's)   Relieved by:  Nothing Worsened by:  Nothing tried Ineffective treatments:  Phenazopyridine Urinary symptoms: foul-smelling urine and frequent urination   Urinary symptoms: no bladder incontinence   Associated symptoms: no abdominal pain, no fever, no nausea, no vaginal discharge and no vomiting   Risk factors comment:  Reports is borderline diabetic   Past Medical History  Diagnosis Date  . Abnormal Pap smear   . Anxiety   . Depression   . Bipolar 1 disorder   . UTI (urinary tract infection)   . Pregnancy   . Kidney infection   . IUD (intrauterine device) in place 04/12/2013    Had IUD inserted 03/29/13 could not feel strings went to ER 9/18 and KUB saw IUD still could not see string, can't see now but seen in US   Past Surgical History  Procedure Laterality Date  . Tooth extraction    . Cesarean section     Family History  Problem Relation Age of Onset  . Hypertension Father   . Diabetes Father   . Diabetes Paternal Aunt   . Diabetes Paternal Uncle   . Hypertension Paternal Grandmother   . Diabetes Paternal Grandmother   . Diabetes Paternal Grandfather    History  Substance Use Topics  . Smoking status: Former Smoker    Types: Cigarettes    Quit date: 05/21/2007  . Smokeless tobacco: Never Used  . Alcohol Use: No     Comment: occ.    OB History    Gravida Para Term Preterm AB TAB SAB Ectopic Multiple Living   4 2 1 1 2  0 2 0 0 2     Review of Systems  Constitutional: Negative for fever and chills.  HENT: Negative for congestion and sore throat.   Eyes: Negative.   Respiratory: Negative for chest tightness and shortness of breath.   Cardiovascular: Negative for chest pain.  Gastrointestinal: Negative for nausea, vomiting and abdominal pain.  Genitourinary: Positive for dysuria, urgency and frequency. Negative for hematuria and vaginal discharge.  Musculoskeletal: Negative for joint swelling, arthralgias and neck pain.  Skin: Negative.  Negative for rash and wound.  Neurological: Negative for dizziness, weakness, light-headedness, numbness and headaches.  Psychiatric/Behavioral: Negative.       Allergies  Shellfish allergy  Home Medications   Prior to Admission medications   Medication Sig Start Date End Date Taking? Authorizing Provider  albuterol (PROVENTIL HFA;VENTOLIN HFA) 108 (90 BASE) MCG/ACT inhaler Inhale 2 puffs into the lungs every 6 (six) hours as needed for wheezing or shortness of breath. 06/14/13  Yes Acey LavAllison L Wood, MD  cephALEXin (KEFLEX) 500 MG capsule Take 2 capsules (1,000 mg total) by mouth 2 (two) times daily. 08/16/14   Burgess AmorJulie Mordche Hedglin, PA-C  HYDROcodone-acetaminophen (NORCO/VICODIN) 5-325 MG per tablet Take 1 tablet by mouth every 6 (six) hours as needed. 01/02/14   Derwood KaplanAnkit Nanavati, MD  levonorgestrel (MIRENA) 20 MCG/24HR IUD 1 each  by Intrauterine route once.    Historical Provider, MD  loratadine (CLARITIN) 10 MG tablet Take 1 tablet (10 mg total) by mouth daily. 05/24/13   Acey Lav, MD  naproxen (NAPROSYN) 500 MG tablet Take 1 tab po every 12 hours as needed for headaches, with food 08/03/13   Kela Millin, MD  nitrofurantoin, macrocrystal-monohydrate, (MACROBID) 100 MG capsule Take 1 capsule (100 mg total) by mouth 2 (two) times daily. X 7 days 05/11/14   Dione Booze, MD  oxyCODONE-acetaminophen  (PERCOCET/ROXICET) 5-325 MG per tablet Take 1 tablet by mouth every 4 (four) hours as needed. 05/11/14   Dione Booze, MD  silver sulfADIAZINE (SILVADENE) 1 % cream Apply 1 application topically daily. 01/02/14   Derwood Kaplan, MD  Spacer/Aero-Holding Chambers (AEROCHAMBER PLUS) inhaler Use as instructed 06/14/13   Acey Lav, MD  SUMAtriptan [facility]) 50 MG tablet May repeat in 2 hours if headache persists or recurs. 08/03/13   Kela Millin, MD   BP 133/74 mmHg  Pulse 86  Temp(Src) 98.8 F (37.1 C) (Oral)  Resp 18  Ht  (1.676 m)  Wt 380 lb (172.367 kg)  BMI 61.36 kg/m2  SpO2 99% Physical Exam  Constitutional: She appears well-developed and well-nourished.  HENT:  Head: Normocephalic and atraumatic.  Eyes: Conjunctivae are normal.  Neck: Normal range of motion.  Cardiovascular: Normal rate, regular rhythm, normal heart sounds and intact distal pulses.   Pulmonary/Chest: Effort normal and breath sounds normal. She has no wheezes.  Abdominal: Soft. Bowel sounds are normal. There is no tenderness. There is no guarding.  Mild suprapubic discomfort.  No cva tenderness.  Musculoskeletal: Normal range of motion.  Neurological: She is alert.  Skin: Skin is warm and dry.  Psychiatric: She has a normal mood and affect.  Nursing note and vitals reviewed.   ED Course  Procedures (including critical care time) Labs Review Labs Reviewed  URINALYSIS, ROUTINE W REFLEX MICROSCOPIC - Abnormal; Notable for the following:    Color, Urine ORANGE (*)    APPearance CLOUDY (*)    Glucose, UA 500 (*)    Hgb urine dipstick LARGE (*)    Ketones, ur 15 (*)    Protein, ur >300 (*)    Urobilinogen, UA >8.0 (*)    Nitrite POSITIVE (*)    Leukocytes, UA SMALL (*)    All other components within normal limits  URINE MICROSCOPIC-ADD ON - Abnormal; Notable for the following:    Squamous Epithelial / LPF MANY (*)    Bacteria, UA MANY (*)    All other components within normal limits  URINE  CULTURE  PREGNANCY, URINE  I-STAT CHEM 8, ED    Imaging Review No results found.   EKG Interpretation None      MDM   Final diagnoses:  UTI (lower urinary tract infection)    Urine cx sent.  Pt prescribed keflex.  Advised increased fluid intake.  Repeat UA after abx completed.  Recheck sooner for worse sx, fever, vomiting.    The patient appears reasonably screened and/or stabilized for discharge and I doubt any other medical condition or other [facility] requiring further screening, evaluation, or treatment in the ED at this time prior to discharge.  Patients labs and/or radiological studies were viewed and considered during the medical decision making and disposition process.   Burgess Amor, PA-C 08/16/14 1315  Ward Givens, MD 08/21/14 2256

## 2014-08-16 NOTE — Discharge Instructions (Signed)

## 2014-08-17 LAB — URINE CULTURE: Colony Count: 30000

## 2014-09-17 ENCOUNTER — Emergency Department (HOSPITAL_COMMUNITY)
Admission: EM | Admit: 2014-09-17 | Discharge: 2014-09-18 | Disposition: A | Discharge status: Home / Self Care | Payer: Medicaid Other | Attending: Emergency Medicine | Admitting: Emergency Medicine

## 2014-09-17 ENCOUNTER — Emergency Department (HOSPITAL_COMMUNITY): Payer: Medicaid Other

## 2014-09-17 ENCOUNTER — Encounter (HOSPITAL_COMMUNITY): Payer: Self-pay | Admitting: *Deleted

## 2014-09-17 DIAGNOSIS — Z793 Long term (current) use of hormonal contraceptives: Secondary | ICD-10-CM | POA: Diagnosis not present

## 2014-09-17 DIAGNOSIS — J209 Acute bronchitis, unspecified: Secondary | ICD-10-CM | POA: Insufficient documentation

## 2014-09-17 DIAGNOSIS — Z87440 Personal history of urinary (tract) infections: Secondary | ICD-10-CM | POA: Insufficient documentation

## 2014-09-17 DIAGNOSIS — Z87891 Personal history of nicotine dependence: Secondary | ICD-10-CM | POA: Diagnosis not present

## 2014-09-17 DIAGNOSIS — Z79899 Other long term (current) drug therapy: Secondary | ICD-10-CM | POA: Diagnosis not present

## 2014-09-17 DIAGNOSIS — F419 Anxiety disorder, unspecified: Secondary | ICD-10-CM | POA: Diagnosis not present

## 2014-09-17 DIAGNOSIS — R05 Cough: Secondary | ICD-10-CM | POA: Diagnosis present

## 2014-09-17 DIAGNOSIS — F319 Bipolar disorder, unspecified: Secondary | ICD-10-CM | POA: Diagnosis not present

## 2014-09-17 DIAGNOSIS — Z975 Presence of (intrauterine) contraceptive device: Secondary | ICD-10-CM | POA: Diagnosis not present

## 2014-09-17 MED ORDER — BENZONATATE 200 MG PO CAPS: 200.0000 mg | ORAL_CAPSULE | Freq: Three times a day (TID) | ORAL | Status: DC

## 2014-09-17 MED ORDER — BENZONATATE 100 MG PO CAPS
200.0000 mg | ORAL_CAPSULE | Freq: Once | ORAL | Status: AC
  Administered 2014-09-17: 200 mg via ORAL
  Filled 2014-09-17: qty 2

## 2014-09-17 NOTE — ED Notes (Signed)
Cough and chest congestion x 3 weeks.  Has been using Mucinex DM Max without any relief of symptoms.  Denies fevers.  Mild nausea one week ago none now.

## 2014-09-17 NOTE — Discharge Instructions (Signed)
Acute Bronchitis Bronchitis is inflammation of the airways that extend from the windpipe into the lungs (bronchi). The inflammation often causes mucus to develop. This leads to a cough, which is the most common symptom of bronchitis.  In acute bronchitis, the condition usually develops suddenly and goes away over time, usually in a couple weeks. Smoking, allergies, and asthma can make bronchitis worse. Repeated episodes of bronchitis may cause further lung problems.  CAUSES Acute bronchitis is most often caused by the same virus that causes a cold. The virus can spread from person to person (contagious) through coughing, sneezing, and touching contaminated objects. SIGNS AND SYMPTOMS   Cough.   Fever.   Coughing up mucus.   Body aches.   Chest congestion.   Chills.   Shortness of breath.   Sore throat.  DIAGNOSIS  Acute bronchitis is usually diagnosed through a physical exam. Your health care provider will also ask you questions about your medical history. Tests, such as chest X-rays, are sometimes done to rule out other conditions.  TREATMENT  Acute bronchitis usually goes away in a couple weeks. Oftentimes, no medical treatment is necessary. Medicines are sometimes given for relief of fever or cough. Antibiotic medicines are usually not needed but may be prescribed in certain situations. In some cases, an inhaler may be recommended to help reduce shortness of breath and control the cough. A cool mist vaporizer may also be used to help thin bronchial secretions and make it easier to clear the chest.  HOME CARE INSTRUCTIONS  Get plenty of rest.   Drink enough fluids to keep your urine clear or pale yellow (unless you have a medical condition that requires fluid restriction). Increasing fluids may help thin your respiratory secretions (sputum) and reduce chest congestion, and it will prevent dehydration.   Take medicines only as directed by your health care provider.  If  you were prescribed an antibiotic medicine, finish it all even if you start to feel better.  Avoid smoking and secondhand smoke. Exposure to cigarette smoke or irritating chemicals will make bronchitis worse. If you are a smoker, consider using nicotine gum or skin patches to help control withdrawal symptoms. Quitting smoking will help your lungs heal faster.   Reduce the chances of another bout of acute bronchitis by washing your hands frequently, avoiding people with cold symptoms, and trying not to touch your hands to your mouth, nose, or eyes.   Keep all follow-up visits as directed by your health care provider.  SEEK MEDICAL CARE IF: Your symptoms do not improve after 1 week of treatment.  SEEK IMMEDIATE MEDICAL CARE IF:  You develop an increased fever or chills.   You have chest pain.   You have severe shortness of breath.  You have bloody sputum.   You develop dehydration.  You faint or repeatedly feel like you are going to pass out.  You develop repeated vomiting.  You develop a severe headache. MAKE SURE YOU:   Understand these instructions.  Will watch your condition.  Will get help right away if you are not doing well or get worse. Document Released: 08/15/2004 Document Revised: 11/22/2013 Document Reviewed: 12/29/2012 [facility] Patient Information 2015 [place], [place]. This information is not intended to replace advice given to you by your health care provider. Make sure you discuss any questions you have with your health care provider.   Use your albuterol inhaler - 2 puffs every 4 hours if you are wheezing, coughing or short of breath.  Take the  tessalon for cough suppression.

## 2014-09-18 MED ORDER — ALBUTEROL SULFATE HFA 108 (90 BASE) MCG/ACT IN AERS
2.0000 | INHALATION_SPRAY | RESPIRATORY_TRACT | Status: DC | PRN
  Administered 2014-09-18: 2 via RESPIRATORY_TRACT
  Filled 2014-09-18: qty 6.7

## 2014-09-18 NOTE — ED Notes (Signed)
Discharge instructions and prescription given and reviewed with patient.  Patient verbalized understanding to take medication as directed and to follow up with [facility] for further care.  Patient ambulatory; discharged home in good condition.

## 2014-09-18 NOTE — ED Provider Notes (Signed)
CSN: [id number]     Arrival date & time 09/17/14  2119 History   First MD Initiated Contact with Patient 09/17/14 2146     Chief Complaint  Patient presents with  . Cough     (Consider location/radiation/quality/duration/timing/severity/associated sxs/prior Treatment) The history is provided by the patient.   [name] [name] is a 29 y.o. female with no significant past medical history presenting with persistent non productive cough with occasional clear sputum production for the past 3 weeks since recovering from an upper uri which included sore throat, nasal congestion and discharge with resolution of these symptoms.  She denies shortness of breath but does endorse occasional wheezing, most noticeable at night.  Denies asthma history, but has hx of acute bronchitis.  She does not smoke but is exposed to 2nd hand smoke at home.  She has taken Mucinex without improvement.  No current fevers.  Denies chest pain.     Past Medical History  Diagnosis Date  . Abnormal Pap smear   . Anxiety   . Depression   . Bipolar 1 disorder   . UTI (urinary tract infection)   . Pregnancy   . Kidney infection   . IUD (intrauterine device) in place 04/12/2013    Had IUD inserted 03/29/13 could not feel strings went to ER 9/18 and KUB saw IUD still could not see string, can't see now but seen in [place]   Past Surgical History  Procedure Laterality Date  . Tooth extraction    . Cesarean section     Family History  Problem Relation Age of Onset  . Hypertension Father   . Diabetes Father   . Diabetes Paternal Aunt   . Diabetes Paternal Uncle   . Hypertension Paternal Grandmother   . Diabetes Paternal Grandmother   . Diabetes Paternal Grandfather    History  Substance Use Topics  . Smoking status: Former Smoker    Types: Cigarettes    Quit date: 05/21/2007  . Smokeless tobacco: Never Used  . Alcohol Use: No     Comment: occ.    OB History    Gravida Para Term Preterm AB TAB SAB Ectopic Multiple  Living   0 2 0 0 2     Review of Systems  Constitutional: Negative for fever and chills.  HENT: Negative for congestion, ear pain, rhinorrhea, sinus pressure, sore throat, trouble swallowing and voice change.   Eyes: Negative for discharge.  Respiratory: Positive for cough and wheezing. Negative for shortness of breath and stridor.   Cardiovascular: Negative for chest pain.  Gastrointestinal: Negative for nausea, vomiting and abdominal pain.  Genitourinary: Negative.       Allergies  Shellfish allergy  Home Medications   Prior to Admission medications   Medication Sig Start Date End Date Taking? Authorizing Provider  albuterol (PROVENTIL HFA;VENTOLIN HFA) 108 (90 BASE) MCG/ACT inhaler Inhale 2 puffs into the lungs every 6 (six) hours as needed for wheezing or shortness of breath. 06/14/13   Acey Lav, MD  benzonatate (TESSALON) 200 MG capsule Take 1 capsule (200 mg total) by mouth every 8 (eight) hours. 09/17/14   Burgess Amor, PA-C  cephALEXin (KEFLEX) 500 MG capsule Take 2 capsules (1,000 mg total) by mouth 2 (two) times daily. 08/16/14   Burgess Amor, PA-C  HYDROcodone-acetaminophen (NORCO/VICODIN) 5-325 MG per tablet Take 1 tablet by mouth every 6 (six) hours as needed. 01/02/14   Derwood Kaplan, MD  levonorgestrel (MIRENA) 20 MCG/24HR IUD 1 each  by Intrauterine route once.    Historical Provider, MD  loratadine (CLARITIN) 10 MG tablet Take 1 tablet (10 mg total) by mouth daily. 05/24/13   Acey LavAllison L Wood, MD  naproxen (NAPROSYN) 500 MG tablet Take 1 tab po every 12 hours as needed for headaches, with food 08/03/13   Kela MillinAlethea Y Barrino, MD  nitrofurantoin, macrocrystal-monohydrate, (MACROBID) 100 MG capsule Take 1 capsule (100 mg total) by mouth 2 (two) times daily. X 7 days 05/11/14   Dione Boozeavid Glick, MD  oxyCODONE-acetaminophen (PERCOCET/ROXICET) 5-325 MG per tablet Take 1 tablet by mouth every 4 (four) hours as needed. 05/11/14   Dione Boozeavid Glick, MD  silver sulfADIAZINE (SILVADENE)  1 % cream Apply 1 application topically daily. 01/02/14   Derwood KaplanAnkit Nanavati, MD  Spacer/Aero-Holding Chambers (AEROCHAMBER PLUS) inhaler Use as instructed 06/14/13   Acey LavAllison L Wood, MD  SUMAtriptan [facility](IMITREX) 50 MG tablet May repeat in 2 hours if headache persists or recurs. 08/03/13   Kela MillinAlethea Y Barrino, MD   BP 140/82 mmHg  Pulse 80  Temp(Src) 98.4 F (36.9 C) (Oral)  Resp 18  Ht 5\' 9"  (1.753 m)  Wt 350 lb (158.759 kg)  BMI 51.66 kg/m2  SpO2 99% Physical Exam  Constitutional: She appears well-developed and well-nourished.  Morbidly obese, pleasant patient.  HENT:  Head: Normocephalic and atraumatic.  Right Ear: Tympanic membrane normal.  Left Ear: Tympanic membrane normal.  Nose: No mucosal edema or rhinorrhea.  Mouth/Throat: Uvula is midline and oropharynx is clear and moist. No oropharyngeal exudate, posterior oropharyngeal edema, posterior oropharyngeal erythema or tonsillar abscesses.  Eyes: Conjunctivae are normal.  Neck: Normal range of motion.  Cardiovascular: Normal rate, regular rhythm, normal heart sounds and intact distal pulses.   Pulmonary/Chest: Effort normal. She has wheezes. She has no rhonchi.  Expiratory wheeze which is occasional in lower lung fields, clears with cough.  No significant decreased air movement, no rhonchi.  Abdominal: Soft. Bowel sounds are normal. There is no tenderness.  Musculoskeletal: Normal range of motion.  Neurological: She is alert.  Skin: Skin is warm and dry.  Psychiatric: She has a normal mood and affect.  Nursing note and vitals reviewed.   ED Course  Procedures (including critical care time) Labs Review Labs Reviewed - No data to display  Imaging Review Dg Chest 2 View  09/17/2014   CLINICAL DATA:  Productive cough for 1 month, chest pain.  EXAM: CHEST  2 VIEW  COMPARISON:  None.  FINDINGS: Cardiomediastinal silhouette is unremarkable for this low inspiratory examination with crowded vasculature markings. The lungs are clear without  pleural effusions or focal consolidations. Mild bronchitic changes. Trachea projects midline and there is no pneumothorax. Included soft tissue planes and osseous structures are non-suspicious. Large body habitus.  IMPRESSION: Mild bronchitic changes in this low inspiratory examination.   Electronically Signed   By: Awilda Metroourtnay  Bloomer   On: 09/17/2014 23:24     EKG Interpretation None      MDM   Final diagnoses:  Acute bronchitis, unspecified organism    Patients labs and/or radiological studies were viewed and considered during the medical decision making and disposition process. Pt given new albuterol mdi, with resolution of wheezing, tessalon for cough suppression.  Advised pt husband should not smoke around her, preferably outside so she is not exposed to any smoke residue.  Advised f/u for any worsened sx.  Minimal wheezing sx, doubt need for steroids at this time.    Burgess AmorJulie Ceasar Decandia, PA-C 09/18/14 1404  Donnetta HutchingBrian Cook, MD 09/18/14 [phone]

## 2014-11-04 ENCOUNTER — Encounter (HOSPITAL_COMMUNITY): Payer: Self-pay | Admitting: *Deleted

## 2014-11-04 ENCOUNTER — Emergency Department (HOSPITAL_COMMUNITY)
Admission: EM | Admit: 2014-11-04 | Discharge: 2014-11-04 | Disposition: A | Discharge status: Home / Self Care | Payer: Medicaid Other | Attending: Emergency Medicine | Admitting: Emergency Medicine

## 2014-11-04 DIAGNOSIS — F419 Anxiety disorder, unspecified: Secondary | ICD-10-CM | POA: Diagnosis not present

## 2014-11-04 DIAGNOSIS — F319 Bipolar disorder, unspecified: Secondary | ICD-10-CM | POA: Diagnosis not present

## 2014-11-04 DIAGNOSIS — Z9889 Other specified postprocedural states: Secondary | ICD-10-CM | POA: Diagnosis not present

## 2014-11-04 DIAGNOSIS — N390 Urinary tract infection, site not specified: Secondary | ICD-10-CM | POA: Insufficient documentation

## 2014-11-04 DIAGNOSIS — Z79899 Other long term (current) drug therapy: Secondary | ICD-10-CM | POA: Insufficient documentation

## 2014-11-04 DIAGNOSIS — Z791 Long term (current) use of non-steroidal anti-inflammatories (NSAID): Secondary | ICD-10-CM | POA: Insufficient documentation

## 2014-11-04 DIAGNOSIS — Z3202 Encounter for pregnancy test, result negative: Secondary | ICD-10-CM | POA: Insufficient documentation

## 2014-11-04 DIAGNOSIS — Z792 Long term (current) use of antibiotics: Secondary | ICD-10-CM | POA: Insufficient documentation

## 2014-11-04 DIAGNOSIS — R350 Frequency of micturition: Secondary | ICD-10-CM | POA: Diagnosis present

## 2014-11-04 LAB — URINALYSIS, ROUTINE W REFLEX MICROSCOPIC
GLUCOSE, UA: 100 mg/dL — AB
KETONES UR: 15 mg/dL — AB
Nitrite: POSITIVE — AB
PH: 5 (ref 5.0–8.0)
Protein, ur: 100 mg/dL — AB
Specific Gravity, Urine: >1.03 — ABNORMAL HIGH (ref 1.005–1.030)
Urobilinogen, UA: 4 mg/dL — ABNORMAL HIGH (ref 0.0–1.0)

## 2014-11-04 LAB — URINE MICROSCOPIC-ADD ON

## 2014-11-04 LAB — POC URINE PREG, ED: Preg Test, Ur: NEGATIVE

## 2014-11-04 MED ORDER — CEPHALEXIN 500 MG PO CAPS
1000.0000 mg | ORAL_CAPSULE | Freq: Once | ORAL | Status: AC
  Administered 2014-11-04: 1000 mg via ORAL
  Filled 2014-11-04: qty 2

## 2014-11-04 MED ORDER — CEPHALEXIN 500 MG PO CAPS: 1000.0000 mg | ORAL_CAPSULE | Freq: Two times a day (BID) | ORAL | Status: DC

## 2014-11-04 NOTE — ED Notes (Signed)
Pt reports pain and burning on urination with lower abdominal discomfort. Reports nausea but denies emesis.

## 2014-11-04 NOTE — Discharge Instructions (Signed)

## 2014-11-07 LAB — URINE CULTURE

## 2014-11-07 NOTE — ED Provider Notes (Signed)
**Note [name]-Identified via Obfuscation** CSN: [id number]     Arrival date & time 11/04/14  1418 History   First MD Initiated Contact with Patient 11/04/14 1446     Chief Complaint  Patient presents with  . Urinary Tract Infection     (Consider location/radiation/quality/duration/timing/severity/associated sxs/prior Treatment) HPI   [name] [name] is a 29 y.o. female who presents to the Emergency Department complaining of pain, burning with urination and urinary frequency for several days.  She reports a h/o recurrent UTI's and states the present symptoms are similar to previous infections.  She describes an aching, pressure to her lower abdomen with intermittent nausea.  She has tried OTC AZO without relief.  She denies fever, chills, vomiting, back pain, vaginal bleeding, pain or discharge.    Past Medical History  Diagnosis Date  . Abnormal Pap smear   . Anxiety   . Depression   . Bipolar 1 disorder   . UTI (urinary tract infection)   . Pregnancy   . Kidney infection   . IUD (intrauterine device) in place 04/12/2013    Had IUD inserted 03/29/13 could not feel strings went to ER 9/18 and KUB saw IUD still could not see string, can't see now but seen in [place]   Past Surgical History  Procedure Laterality Date  . Tooth extraction    . Cesarean section     Family History  Problem Relation Age of Onset  . Hypertension Father   . Diabetes Father   . Diabetes Paternal Aunt   . Diabetes Paternal Uncle   . Hypertension Paternal Grandmother   . Diabetes Paternal Grandmother   . Diabetes Paternal Grandfather    History  Substance Use Topics  . Smoking status: Former Smoker    Types: Cigarettes    Quit date: 05/21/2007  . Smokeless tobacco: Never Used  . Alcohol Use: No     Comment: occ.    OB History    Gravida Para Term Preterm AB TAB SAB Ectopic Multiple Living   4 2 1 1 2  0 2 0 0 2     Review of Systems  Constitutional: Negative for fever, chills, activity change and appetite change.  Respiratory: Negative  for chest tightness and shortness of breath.   Gastrointestinal: Positive for nausea. Negative for vomiting and abdominal pain.  Genitourinary: Positive for dysuria, urgency and frequency. Negative for hematuria, flank pain, decreased urine volume, vaginal bleeding, vaginal discharge, difficulty urinating and pelvic pain.  Musculoskeletal: Negative for back pain.  Skin: Negative for rash.  Neurological: Negative for dizziness, weakness and numbness.  Hematological: Negative for adenopathy.  Psychiatric/Behavioral: Negative for confusion.  All other systems reviewed and are negative.     Allergies  Shellfish allergy  Home Medications   Prior to Admission medications   Medication Sig Start Date End Date Taking? Authorizing Provider  albuterol (PROVENTIL HFA;VENTOLIN HFA) 108 (90 BASE) MCG/ACT inhaler Inhale 2 puffs into the lungs every 6 (six) hours as needed for wheezing or shortness of breath. 06/14/13  Yes Acey LavAllison L Wood, MD  levonorgestrel (MIRENA) 20 MCG/24HR IUD 1 each by Intrauterine route once.   Yes Historical Provider, MD  naproxen sodium (ANAPROX) 220 MG tablet Take 440 mg by mouth daily.   Yes Historical Provider, MD  Phenazopyridine HCl (AZO TABS PO) Take 1 Dose by mouth once.   Yes Historical Provider, MD  benzonatate (TESSALON) 200 MG capsule Take 1 capsule (200 mg total) by mouth every 8 (eight) hours. Patient not taking: Reported on 11/04/2014 09/17/14  Burgess Amor, PA-C  cephALEXin (KEFLEX) 500 MG capsule Take 2 capsules (1,000 mg total) by mouth 2 (two) times daily. For 7 days 11/04/14   Jadis Mika, PA-C  HYDROcodone-acetaminophen (NORCO/VICODIN) 5-325 MG per tablet Take 1 tablet by mouth every 6 (six) hours as needed. Patient not taking: Reported on 11/04/2014 01/02/14   Derwood Kaplan, MD  loratadine (CLARITIN) 10 MG tablet Take 1 tablet (10 mg total) by mouth daily. Patient not taking: Reported on 11/04/2014 05/24/13   Acey Lav, MD  naproxen (NAPROSYN) 500 MG  tablet Take 1 tab po every 12 hours as needed for headaches, with food Patient not taking: Reported on 11/04/2014 08/03/13   Kela Millin, MD  nitrofurantoin, macrocrystal-monohydrate, (MACROBID) 100 MG capsule Take 1 capsule (100 mg total) by mouth 2 (two) times daily. X 7 days Patient not taking: Reported on 11/04/2014 05/11/14   Dione Booze, MD  oxyCODONE-acetaminophen (PERCOCET/ROXICET) 5-325 MG per tablet Take 1 tablet by mouth every 4 (four) hours as needed. Patient not taking: Reported on 11/04/2014 05/11/14   Dione Booze, MD  silver sulfADIAZINE (SILVADENE) 1 % cream Apply 1 application topically daily. Patient not taking: Reported on 11/04/2014 01/02/14   Derwood Kaplan, MD  Spacer/Aero-Holding Chambers (AEROCHAMBER PLUS) inhaler Use as instructed Patient not taking: Reported on 11/04/2014 06/14/13   Acey Lav, MD  SUMAtriptan [facility]) 50 MG tablet May repeat in 2 hours if headache persists or recurs. Patient not taking: Reported on 11/04/2014 08/03/13   Kela Millin, MD   BP 128/84 mmHg  Pulse 79  Temp(Src) 98.4 F (36.9 C) (Oral)  Resp 18  Ht  (1.753 m)  Wt 350 lb (158.759 kg)  BMI 51.66 kg/m2  SpO2 100% Physical Exam  Constitutional: She is oriented to person, place, and time. She appears well-developed and well-nourished. No distress.  HENT:  Head: Normocephalic and atraumatic.  Cardiovascular: Normal rate, regular rhythm, normal heart sounds and intact distal pulses.   No murmur heard. Pulmonary/Chest: Effort normal and breath sounds normal. No respiratory distress. She has no wheezes. She has no rales.  Abdominal: Soft. Normal appearance. She exhibits no distension and no mass. There is no hepatosplenomegaly. There is tenderness in the suprapubic area. There is no rigidity, no rebound, no guarding, no CVA tenderness and no tenderness at McBurney's point.  Mild ttp of the suprapubic region.  Remaining abdomen is soft, non-tender without guarding or rebound  tenderness. No CVA tenderness  Musculoskeletal: Normal range of motion. She exhibits no edema.  Neurological: She is alert and oriented to person, place, and time. Coordination normal.  Skin: Skin is warm and dry. No rash noted.  Nursing note and vitals reviewed.   ED Course  Procedures (including critical care time) Labs Review Labs Reviewed  URINALYSIS, ROUTINE W REFLEX MICROSCOPIC - Abnormal; Notable for the following:    Color, Urine ORANGE (*)    Specific Gravity, Urine >1.030 (*)    Glucose, UA 100 (*)    Hgb urine dipstick SMALL (*)    Bilirubin Urine SMALL (*)    Ketones, ur 15 (*)    Protein, ur 100 (*)    Urobilinogen, UA 4.0 (*)    Nitrite POSITIVE (*)    Leukocytes, UA TRACE (*)    All other components within normal limits  URINE MICROSCOPIC-ADD ON - Abnormal; Notable for the following:    Squamous Epithelial / LPF MANY (*)    Bacteria, UA MANY (*)    All other components within  normal limits  URINE CULTURE  POC URINE PREG, ED    Imaging Review No results found.   EKG Interpretation None      MDM   Final diagnoses:  UTI (lower urinary tract infection)    Pt is well appearing, urine culture pending.  No concerning sx's for pyelonephritis.  Agrees to increase water intake, keflex and close PMD f/u.  Advised to return here for any worsening sx's     Pauline Aus, PA-C 11/07/14 1542  Bethann Berkshire, MD 11/07/14 [phone]

## 2014-12-30 ENCOUNTER — Emergency Department (HOSPITAL_COMMUNITY)
Admission: EM | Admit: 2014-12-30 | Discharge: 2014-12-30 | Disposition: A | Discharge status: Home / Self Care | Payer: Medicaid Other | Attending: Emergency Medicine | Admitting: Emergency Medicine

## 2014-12-30 ENCOUNTER — Emergency Department (HOSPITAL_COMMUNITY): Payer: Medicaid Other

## 2014-12-30 ENCOUNTER — Encounter (HOSPITAL_COMMUNITY): Payer: Self-pay | Admitting: *Deleted

## 2014-12-30 DIAGNOSIS — M722 Plantar fascial fibromatosis: Secondary | ICD-10-CM | POA: Insufficient documentation

## 2014-12-30 DIAGNOSIS — Z87448 Personal history of other diseases of urinary system: Secondary | ICD-10-CM | POA: Insufficient documentation

## 2014-12-30 DIAGNOSIS — Z975 Presence of (intrauterine) contraceptive device: Secondary | ICD-10-CM | POA: Insufficient documentation

## 2014-12-30 DIAGNOSIS — Y9389 Activity, other specified: Secondary | ICD-10-CM | POA: Insufficient documentation

## 2014-12-30 DIAGNOSIS — Y9289 Other specified places as the place of occurrence of the external cause: Secondary | ICD-10-CM | POA: Insufficient documentation

## 2014-12-30 DIAGNOSIS — Z79899 Other long term (current) drug therapy: Secondary | ICD-10-CM | POA: Insufficient documentation

## 2014-12-30 DIAGNOSIS — X58XXXA Exposure to other specified factors, initial encounter: Secondary | ICD-10-CM | POA: Insufficient documentation

## 2014-12-30 DIAGNOSIS — Y998 Other external cause status: Secondary | ICD-10-CM | POA: Insufficient documentation

## 2014-12-30 DIAGNOSIS — F419 Anxiety disorder, unspecified: Secondary | ICD-10-CM | POA: Insufficient documentation

## 2014-12-30 DIAGNOSIS — Z791 Long term (current) use of non-steroidal anti-inflammatories (NSAID): Secondary | ICD-10-CM | POA: Insufficient documentation

## 2014-12-30 DIAGNOSIS — Z792 Long term (current) use of antibiotics: Secondary | ICD-10-CM | POA: Insufficient documentation

## 2014-12-30 DIAGNOSIS — Z87440 Personal history of urinary (tract) infections: Secondary | ICD-10-CM | POA: Insufficient documentation

## 2014-12-30 DIAGNOSIS — S93401A Sprain of unspecified ligament of right ankle, initial encounter: Secondary | ICD-10-CM | POA: Insufficient documentation

## 2014-12-30 DIAGNOSIS — Z87891 Personal history of nicotine dependence: Secondary | ICD-10-CM | POA: Insufficient documentation

## 2014-12-30 DIAGNOSIS — F319 Bipolar disorder, unspecified: Secondary | ICD-10-CM | POA: Insufficient documentation

## 2014-12-30 MED ORDER — NAPROXEN 500 MG PO TABS: 500.0000 mg | ORAL_TABLET | Freq: Two times a day (BID) | ORAL | Status: DC

## 2014-12-30 MED ORDER — TRAMADOL HCL 50 MG PO TABS: 50.0000 mg | ORAL_TABLET | Freq: Four times a day (QID) | ORAL | Status: DC | PRN

## 2014-12-30 NOTE — ED Notes (Signed)
Pain  Rt heel and rt ankle .

## 2014-12-30 NOTE — ED Provider Notes (Signed)
CSN: [id number]     Arrival date & time 12/30/14  1618 History   First MD Initiated Contact with Patient 12/30/14 1713     Chief Complaint  Patient presents with  . Ankle Pain     (Consider location/radiation/quality/duration/timing/severity/associated sxs/prior Treatment) HPI   [name] [name] is a 29 y.o. female who presents to the Emergency Department complaining of right ankle and heel pain.  She reports an eversion injury to the ankle that occurred one day prior to arrival.  Pain is worse with weight bearing.  She also c/o right heel pain that she reports is "ongoing"  Reports hx of planter fasciitis,  She denies swelling, numbness or weakness of the extremity, and pain proximal to the ankle.     Past Medical History  Diagnosis Date  . Abnormal Pap smear   . Anxiety   . Depression   . Bipolar 1 disorder   . UTI (urinary tract infection)   . Pregnancy   . IUD (intrauterine device) in place 04/12/2013    Had IUD inserted 03/29/13 could not feel strings went to ER 9/18 and KUB saw IUD still could not see string, can't see now but seen in [place]  . Kidney infection    Past Surgical History  Procedure Laterality Date  . Tooth extraction    . Cesarean section     Family History  Problem Relation Age of Onset  . Hypertension Father   . Diabetes Father   . Diabetes Paternal Aunt   . Diabetes Paternal Uncle   . Hypertension Paternal Grandmother   . Diabetes Paternal Grandmother   . Diabetes Paternal Grandfather    History  Substance Use Topics  . Smoking status: Former Smoker    Types: Cigarettes    Quit date: 05/21/2007  . Smokeless tobacco: Never Used  . Alcohol Use: No     Comment: occ.    OB History    Gravida Para Term Preterm AB TAB SAB Ectopic Multiple Living   0 2 0 0 2     Review of Systems  Constitutional: Negative for fever and chills.  Musculoskeletal: Positive for arthralgias (Right ankle and heel pain). Negative for joint swelling.  Skin:  Negative for color change and wound.  Neurological: Negative for dizziness, weakness and numbness.  All other systems reviewed and are negative.     Allergies  Shellfish allergy  Home Medications   Prior to Admission medications   Medication Sig Start Date End Date Taking? Authorizing Provider  albuterol (PROVENTIL HFA;VENTOLIN HFA) 108 (90 BASE) MCG/ACT inhaler Inhale 2 puffs into the lungs every 6 (six) hours as needed for wheezing or shortness of breath. 06/14/13   Acey Lav, MD  benzonatate (TESSALON) 200 MG capsule Take 1 capsule (200 mg total) by mouth every 8 (eight) hours. Patient not taking: Reported on 11/04/2014 09/17/14   Burgess Amor, PA-C  cephALEXin (KEFLEX) 500 MG capsule Take 2 capsules (1,000 mg total) by mouth 2 (two) times daily. For 7 days 11/04/14   Harlynn Kimbell, PA-C  HYDROcodone-acetaminophen (NORCO/VICODIN) 5-325 MG per tablet Take 1 tablet by mouth every 6 (six) hours as needed. Patient not taking: Reported on 11/04/2014 01/02/14   Derwood Kaplan, MD  levonorgestrel (MIRENA) 20 MCG/24HR IUD 1 each by Intrauterine route once.    Historical Provider, MD  loratadine (CLARITIN) 10 MG tablet Take 1 tablet (10 mg total) by mouth daily. Patient not taking: Reported on 11/04/2014 05/24/13   Revonda Standard  Nena Polio, MD  naproxen (NAPROSYN) 500 MG tablet Take 1 tab po every 12 hours as needed for headaches, with food Patient not taking: Reported on 11/04/2014 08/03/13   Kela Millin, MD  naproxen sodium (ANAPROX) 220 MG tablet Take 440 mg by mouth daily.    Historical Provider, MD  nitrofurantoin, macrocrystal-monohydrate, (MACROBID) 100 MG capsule Take 1 capsule (100 mg total) by mouth 2 (two) times daily. X 7 days Patient not taking: Reported on 11/04/2014 05/11/14   Dione Booze, MD  oxyCODONE-acetaminophen (PERCOCET/ROXICET) 5-325 MG per tablet Take 1 tablet by mouth every 4 (four) hours as needed. Patient not taking: Reported on 11/04/2014 05/11/14   Dione Booze, MD   Phenazopyridine HCl (AZO TABS PO) Take 1 Dose by mouth once.    Historical Provider, MD  silver sulfADIAZINE (SILVADENE) 1 % cream Apply 1 application topically daily. Patient not taking: Reported on 11/04/2014 01/02/14   Derwood Kaplan, MD  Spacer/Aero-Holding Chambers (AEROCHAMBER PLUS) inhaler Use as instructed Patient not taking: Reported on 11/04/2014 06/14/13   Acey Lav, MD  SUMAtriptan [facility]) 50 MG tablet May repeat in 2 hours if headache persists or recurs. Patient not taking: Reported on 11/04/2014 08/03/13   Kela Millin, MD   BP 129/79 mmHg  Pulse 98  Temp(Src) 98.3 F (36.8 C) (Oral)  Resp 20  Ht 5\' 9"  (1.753 m)  Wt 280 lb (127.007 kg)  BMI 41.33 kg/m2  SpO2 100% Physical Exam  Constitutional: She is oriented to person, place, and time. She appears well-developed and well-nourished. No distress.  HENT:  Head: Normocephalic and atraumatic.  Cardiovascular: Normal rate, regular rhythm, normal heart sounds and intact distal pulses.   Pulmonary/Chest: Effort normal and breath sounds normal.  Musculoskeletal: She exhibits tenderness.  ttp of the lateral right ankle.  No edema.   ROM is preserved.  Tender to plantar surface of the heel.  DP pulse is brisk,distal sensation intact.  No erythema, abrasion, bruising or bony deformity.  No proximal tenderness.  Neurological: She is alert and oriented to person, place, and time. She exhibits normal muscle tone. Coordination normal.  Skin: Skin is warm and dry.  Nursing note and vitals reviewed.   ED Course  Procedures (including critical care time) Labs Review Labs Reviewed - No data to display  Imaging Review Dg Ankle Complete Right  12/30/2014   CLINICAL DATA:  Recent inversion injury of the ankle, initial encounter  EXAM: RIGHT ANKLE - COMPLETE 3+ VIEW  COMPARISON:  None.  FINDINGS: Generalized soft tissue swelling is noted consistent with a recent injury. No acute fracture or dislocation is noted. Some degenerative  changes in the tarsal bones are noted.  IMPRESSION: Soft tissue swelling without acute bony abnormality.   Electronically Signed   By: Alcide Clever M.D.   On: 12/30/2014 17:46     EKG Interpretation None      MDM   Final diagnoses:  Ankle sprain, right, initial encounter  Plantar fasciitis of right foot    Neg XR.  Likely ankle sprain.  ASO applied, pain improved, remains NV intact.  Given referral info for podiatry, stable for d/c    Pauline Aus, PA-C 01/01/15 1605  Bethann Berkshire, MD 01/03/15 [phone]

## 2014-12-30 NOTE — ED Notes (Signed)
Pain rt ankle after "stepped off the porch wrong".  Pain rt heel also, but this is chronic.  Says motrin is not working for the heel pain anymore.

## 2014-12-30 NOTE — Discharge Instructions (Signed)
Ankle Sprain An ankle sprain is an injury to the strong, fibrous tissues (ligaments) that hold your ankle bones together.  HOME CARE   Put ice on your ankle for 1-2 days or as told by your doctor.  Put ice in a plastic bag.  Place a towel between your skin and the bag.  Leave the ice on for 15-20 minutes at a time, every 2 hours while you are awake.  Only take medicine as told by your doctor.  Raise (elevate) your injured ankle above the level of your heart as much as possible for 2-3 days.  Use crutches if your doctor tells you to. Slowly put your own weight on the affected ankle. Use the crutches until you can walk without pain.  If you have a plaster splint:  Do not rest it on anything harder than a pillow for 24 hours.  Do not put weight on it.  Do not get it wet.  Take it off to shower or bathe.  If given, use an elastic wrap or support stocking for support. Take the wrap off if your toes lose feeling (numb), tingle, or turn cold or blue.  If you have an air splint:  Add or let out air to make it comfortable.  Take it off at night and to shower and bathe.  Wiggle your toes and move your ankle up and down often while you are wearing it. GET HELP IF:  You have rapidly increasing bruising or puffiness (swelling).  Your toes feel very cold.  You lose feeling in your foot.  Your medicine does not help your pain. GET HELP RIGHT AWAY IF:   Your toes lose feeling (numb) or turn blue.  You have severe pain that is increasing. MAKE SURE YOU:   Understand these instructions.  Will watch your condition.  Will get help right away if you are not doing well or get worse. Document Released: 12/25/2007 Document Revised: 11/22/2013 Document Reviewed: 01/20/2012 [facility] Patient Information 2015 [place], [place]. This information is not intended to replace advice given to you by your health care provider. Make sure you discuss any questions you have with your health care  provider.  Plantar Fasciitis Plantar fasciitis is a common condition that causes foot pain. It is soreness (inflammation) of the band of tough fibrous tissue on the bottom of the foot that runs from the heel bone (calcaneus) to the ball of the foot. The cause of this soreness may be from excessive standing, poor fitting shoes, running on hard surfaces, being overweight, having an abnormal walk, or overuse (this is common in runners) of the painful foot or feet. It is also common in aerobic exercise dancers and ballet dancers. SYMPTOMS  Most people with plantar fasciitis complain of:  Severe pain in the morning on the bottom of their foot especially when taking the first steps out of bed. This pain recedes after a few minutes of walking.  Severe pain is experienced also during walking following a long period of inactivity.  Pain is worse when walking barefoot or up stairs DIAGNOSIS   Your caregiver will diagnose this condition by examining and feeling your foot.  Special tests such as X-rays of your foot, are usually not needed. PREVENTION   Consult a sports medicine professional before beginning a new exercise program.  Walking programs offer a good workout. With walking there is a lower chance of overuse injuries common to runners. There is less impact and less jarring of the joints.  Begin  all new exercise programs slowly. If problems or pain develop, decrease the amount of time or distance until you are at a comfortable level.  Wear good shoes and replace them regularly.  Stretch your foot and the heel cords at the back of the ankle (Achilles tendon) both before and after exercise.  Run or exercise on even surfaces that are not hard. For example, asphalt is better than pavement.  Do not run barefoot on hard surfaces.  If using a treadmill, vary the incline.  Do not continue to workout if you have foot or joint problems. Seek professional help if they do not improve. HOME CARE  INSTRUCTIONS   Avoid activities that cause you pain until you recover.  Use ice or cold packs on the problem or painful areas after working out.  Only take over-the-counter or prescription medicines for pain, discomfort, or fever as directed by your caregiver.  Soft shoe inserts or athletic shoes with air or gel sole cushions may be helpful.  If problems continue or become more severe, consult a sports medicine caregiver or your own health care provider. Cortisone is a potent anti-inflammatory medication that may be injected into the painful area. You can discuss this treatment with your caregiver. MAKE SURE YOU:   Understand these instructions.  Will watch your condition.  Will get help right away if you are not doing well or get worse. Document Released: 04/02/2001 Document Revised: 09/30/2011 Document Reviewed: 06/01/2008 [facility] Patient Information 2015 [place], [place]. This information is not intended to replace advice given to you by your health care provider. Make sure you discuss any questions you have with your health care provider.

## 2015-05-06 ENCOUNTER — Encounter (HOSPITAL_COMMUNITY): Payer: Self-pay | Admitting: Emergency Medicine

## 2015-05-06 ENCOUNTER — Emergency Department (HOSPITAL_COMMUNITY)
Admission: EM | Admit: 2015-05-06 | Discharge: 2015-05-06 | Disposition: A | Discharge status: Home / Self Care | Payer: Medicaid Other | Attending: Emergency Medicine | Admitting: Emergency Medicine

## 2015-05-06 DIAGNOSIS — G4489 Other headache syndrome: Secondary | ICD-10-CM

## 2015-05-06 DIAGNOSIS — Z791 Long term (current) use of non-steroidal anti-inflammatories (NSAID): Secondary | ICD-10-CM | POA: Insufficient documentation

## 2015-05-06 DIAGNOSIS — Z975 Presence of (intrauterine) contraceptive device: Secondary | ICD-10-CM | POA: Insufficient documentation

## 2015-05-06 DIAGNOSIS — Z87440 Personal history of urinary (tract) infections: Secondary | ICD-10-CM | POA: Insufficient documentation

## 2015-05-06 DIAGNOSIS — Z8659 Personal history of other mental and behavioral disorders: Secondary | ICD-10-CM | POA: Insufficient documentation

## 2015-05-06 DIAGNOSIS — Z87891 Personal history of nicotine dependence: Secondary | ICD-10-CM | POA: Insufficient documentation

## 2015-05-06 DIAGNOSIS — R51 Headache: Secondary | ICD-10-CM | POA: Insufficient documentation

## 2015-05-06 DIAGNOSIS — R6883 Chills (without fever): Secondary | ICD-10-CM | POA: Insufficient documentation

## 2015-05-06 DIAGNOSIS — J0110 Acute frontal sinusitis, unspecified: Secondary | ICD-10-CM | POA: Insufficient documentation

## 2015-05-06 DIAGNOSIS — Z79899 Other long term (current) drug therapy: Secondary | ICD-10-CM | POA: Insufficient documentation

## 2015-05-06 MED ORDER — AZITHROMYCIN 250 MG PO TABS: 250.0000 mg | ORAL_TABLET | Freq: Every day | ORAL | Status: DC

## 2015-05-06 MED ORDER — METOCLOPRAMIDE HCL 5 MG/ML IJ SOLN
10.0000 mg | Freq: Once | INTRAMUSCULAR | Status: AC
  Administered 2015-05-06: 10 mg via INTRAMUSCULAR
  Filled 2015-05-06: qty 2

## 2015-05-06 MED ORDER — DIPHENHYDRAMINE HCL 50 MG/ML IJ SOLN
50.0000 mg | Freq: Once | INTRAMUSCULAR | Status: AC
  Administered 2015-05-06: 50 mg via INTRAMUSCULAR
  Filled 2015-05-06: qty 1

## 2015-05-06 NOTE — Discharge Instructions (Signed)

## 2015-05-06 NOTE — ED Notes (Signed)
Pt states she has had chest and nasal congestion x1.5 weeks with headache. Taken otc medication with minimal releif.

## 2015-05-06 NOTE — ED Notes (Signed)
Discharge instructions given, pt demonstrated teach back and verbal understanding. No concerns voiced.  

## 2015-05-06 NOTE — ED Provider Notes (Signed)
CSN: [id number]     Arrival date & time 05/06/15  0122 History   First MD Initiated Contact with Patient 05/06/15 0134     Chief Complaint  Patient presents with  . Nasal Congestion  . Headache     Patient is a 29 y.o. female presenting with headaches. The history is provided by the patient.  Headache Pain location:  Frontal Quality:  Dull Onset quality:  Gradual Duration:  2 weeks Timing:  Constant Progression:  Worsening Chronicity:  New Similar to prior headaches: yes   Relieved by:  Nothing Worsened by:  Nothing Associated symptoms: congestion and cough   Associated symptoms: no vomiting and no weakness   Pt reports frontal HA for past 1.5-2 weeks She reports cough/congestion She reports chills No focal weakness No falls or head injury No recent travel No tick bites Pt reports similar HA but was over 3 yrs ago No visual changes She also reports feeling of having a sinus infection for past 2 weeks not responding to OTC meds  Past Medical History  Diagnosis Date  . Abnormal Pap smear   . Anxiety   . Depression   . Bipolar 1 disorder (HCC)   . UTI (urinary tract infection)   . Pregnancy   . IUD (intrauterine device) in place 04/12/2013    Had IUD inserted 03/29/13 could not feel strings went to ER 9/18 and KUB saw IUD still could not see string, can't see now but seen in [place]  . Kidney infection    Past Surgical History  Procedure Laterality Date  . Tooth extraction    . Cesarean section     Family History  Problem Relation Age of Onset  . Hypertension Father   . Diabetes Father   . Diabetes Paternal Aunt   . Diabetes Paternal Uncle   . Hypertension Paternal Grandmother   . Diabetes Paternal Grandmother   . Diabetes Paternal Grandfather    Social History  Substance Use Topics  . Smoking status: Former Smoker    Types: Cigarettes    Quit date: 05/21/2007  . Smokeless tobacco: Never Used  . Alcohol Use: No     Comment: occ.    OB History    Gravida  Para Term Preterm AB TAB SAB Ectopic Multiple Living   0 2 0 0 2     Review of Systems  Constitutional: Positive for chills.  HENT: Positive for congestion.   Respiratory: Positive for cough.   Gastrointestinal: Negative for vomiting.  Neurological: Positive for headaches. Negative for weakness.  All other systems reviewed and are negative.     Allergies  Shellfish allergy  Home Medications   Prior to Admission medications   Medication Sig Start Date End Date Taking? Authorizing Provider  albuterol (PROVENTIL HFA;VENTOLIN HFA) 108 (90 BASE) MCG/ACT inhaler Inhale 2 puffs into the lungs every 6 (six) hours as needed for wheezing or shortness of breath. 06/14/13   Acey Lav, MD  azithromycin (ZITHROMAX) 250 MG tablet Take 1 tablet (250 mg total) by mouth daily. Take first 2 tablets together, then 1 every day until finished. 05/06/15   Zadie Rhine, MD  levonorgestrel (MIRENA) 20 MCG/24HR IUD 1 each by Intrauterine route once.    Historical Provider, MD  naproxen (NAPROSYN) 500 MG tablet Take 1 tablet (500 mg total) by mouth 2 (two) times daily with a meal. 12/30/14   Tammy Triplett, PA-C  naproxen sodium (ANAPROX) 220 MG tablet Take 440 mg by  mouth daily.    Historical Provider, MD   BP 130/81 mmHg  Pulse 90  Temp(Src) 97.8 F (36.6 C) (Oral)  Resp 18  Ht 5\' 6"  (1.676 m)  Wt 325 lb (147.419 kg)  BMI 52.48 kg/m2  SpO2 98%  LMP 04/29/2015 Physical Exam CONSTITUTIONAL: Well developed/well nourished HEAD: Normocephalic/atraumatic, mild tenderness to forehead, no stepoffs or crepitus EYES: EOMI/PERRL, no nystagmus, no ptosis, normal fundoscopic exam (no papilledema)  ENMT: Mucous membranes moist, nasal congestion NECK: supple no meningeal signs, no bruits SPINE/BACK:entire spine nontender CV: S1/S2 noted, no murmurs/rubs/gallops noted LUNGS: Lungs are clear to auscultation bilaterally, no apparent distress ABDOMEN: soft, nontender, no rebound or  guarding GU:no cva tenderness NEURO:Awake/alert, face symmetric, no arm or leg drift is noted Equal 5/5 strength with shoulder abduction, elbow flex/extension, wrist flex/extension in upper extremities and equal hand grips bilaterally Equal 5/5 strength with hip flexion,knee flex/extension, foot dorsi/plantar flexion Cranial nerves 3/4/5/6/01/27/09/11/12 tested and intact Gait normal without ataxia No past pointing Sensation to light touch intact in all extremities EXTREMITIES: pulses normal, full ROM SKIN: warm, color normal PSYCH: no abnormalities of mood noted, alert and oriented to situation   ED Course  Procedures  Medications  metoCLOPramide (REGLAN) injection 10 mg (10 mg Intramuscular Given 05/06/15 0155)  diphenhydrAMINE (BENADRYL) injection 50 mg (50 mg Intramuscular Given 05/06/15 0156)    For HA - pt improved, no concerning features on exam/history.  I doubt SAH/pseudotumor or other acute neurologic emergency  She also reports sinusitis.  This has been present up to 2 weeks.  Will offer antibiotics  We discussed strict ER return precautions   MDM   Final diagnoses:  Other headache syndrome  Acute frontal sinusitis, recurrence not specified    Nursing notes including past medical history and social history reviewed and considered in documentation     Zadie Rhineonald Rily Nickey, MD 05/06/15 [phone]

## 2015-06-24 ENCOUNTER — Encounter (HOSPITAL_COMMUNITY): Payer: Self-pay | Admitting: Emergency Medicine

## 2015-06-24 ENCOUNTER — Emergency Department (HOSPITAL_COMMUNITY)
Admission: EM | Admit: 2015-06-24 | Discharge: 2015-06-24 | Disposition: A | Discharge status: Home / Self Care | Payer: Medicaid Other | Attending: Emergency Medicine | Admitting: Emergency Medicine

## 2015-06-24 DIAGNOSIS — Z3202 Encounter for pregnancy test, result negative: Secondary | ICD-10-CM | POA: Insufficient documentation

## 2015-06-24 DIAGNOSIS — R51 Headache: Secondary | ICD-10-CM | POA: Insufficient documentation

## 2015-06-24 DIAGNOSIS — K529 Noninfective gastroenteritis and colitis, unspecified: Secondary | ICD-10-CM | POA: Insufficient documentation

## 2015-06-24 DIAGNOSIS — Z7982 Long term (current) use of aspirin: Secondary | ICD-10-CM | POA: Insufficient documentation

## 2015-06-24 DIAGNOSIS — Z87440 Personal history of urinary (tract) infections: Secondary | ICD-10-CM | POA: Insufficient documentation

## 2015-06-24 DIAGNOSIS — Z87891 Personal history of nicotine dependence: Secondary | ICD-10-CM | POA: Insufficient documentation

## 2015-06-24 DIAGNOSIS — Z8659 Personal history of other mental and behavioral disorders: Secondary | ICD-10-CM | POA: Insufficient documentation

## 2015-06-24 LAB — COMPREHENSIVE METABOLIC PANEL
ALT: 21 U/L (ref 14–54)
ANION GAP: 5 (ref 5–15)
AST: 20 U/L (ref 15–41)
Albumin: 3.4 g/dL — ABNORMAL LOW (ref 3.5–5.0)
Alkaline Phosphatase: 80 U/L (ref 38–126)
BUN: 13 mg/dL (ref 6–20)
CALCIUM: 8.7 mg/dL — AB (ref 8.9–10.3)
CHLORIDE: 107 mmol/L (ref 101–111)
CO2: 27 mmol/L (ref 22–32)
Creatinine, Ser: 0.95 mg/dL (ref 0.44–1.00)
GFR calc non Af Amer: >60 mL/min (ref >60)
Glucose, Bld: 109 mg/dL — ABNORMAL HIGH (ref 65–99)
Potassium: 3.8 mmol/L (ref 3.5–5.1)
SODIUM: 139 mmol/L (ref 135–145)
Total Bilirubin: 0.4 mg/dL (ref 0.3–1.2)
Total Protein: 7.1 g/dL (ref 6.5–8.1)

## 2015-06-24 LAB — URINALYSIS, ROUTINE W REFLEX MICROSCOPIC
Bilirubin Urine: NEGATIVE
Glucose, UA: NEGATIVE mg/dL
Hgb urine dipstick: NEGATIVE
KETONES UR: NEGATIVE mg/dL
LEUKOCYTES UA: NEGATIVE
NITRITE: NEGATIVE
PH: 6 (ref 5.0–8.0)
Protein, ur: NEGATIVE mg/dL
SPECIFIC GRAVITY, URINE: 1.025 (ref 1.005–1.030)

## 2015-06-24 LAB — CBC WITH DIFFERENTIAL/PLATELET
Basophils Absolute: 0 10*3/uL (ref 0.0–0.1)
Basophils Relative: 0 %
EOS ABS: 0.2 10*3/uL (ref 0.0–0.7)
EOS PCT: 2 %
HCT: 39.2 % (ref 36.0–46.0)
Hemoglobin: 12.5 g/dL (ref 12.0–15.0)
LYMPHS ABS: 2.4 10*3/uL (ref 0.7–4.0)
Lymphocytes Relative: 20 %
MCH: 25.3 pg — AB (ref 26.0–34.0)
MCHC: 31.9 g/dL (ref 30.0–36.0)
MCV: 79.4 fL (ref 78.0–100.0)
MONO ABS: 0.6 10*3/uL (ref 0.1–1.0)
Monocytes Relative: 5 %
Neutro Abs: 8.9 10*3/uL — ABNORMAL HIGH (ref 1.7–7.7)
Neutrophils Relative %: 73 %
PLATELETS: 291 10*3/uL (ref 150–400)
RBC: 4.94 MIL/uL (ref 3.87–5.11)
RDW: 15.2 % (ref 11.5–15.5)
WBC: 12.1 10*3/uL — AB (ref 4.0–10.5)

## 2015-06-24 LAB — PREGNANCY, URINE: PREG TEST UR: NEGATIVE

## 2015-06-24 MED ORDER — SODIUM CHLORIDE 0.9 % IV BOLUS (SEPSIS)
1000.0000 mL | Freq: Once | INTRAVENOUS | Status: AC
  Administered 2015-06-24: 1000 mL via INTRAVENOUS

## 2015-06-24 MED ORDER — ONDANSETRON HCL 4 MG/2ML IJ SOLN
4.0000 mg | Freq: Once | INTRAMUSCULAR | Status: AC
  Administered 2015-06-24: 4 mg via INTRAVENOUS
  Filled 2015-06-24: qty 2

## 2015-06-24 MED ORDER — FENTANYL CITRATE (PF) 100 MCG/2ML IJ SOLN
50.0000 ug | Freq: Once | INTRAMUSCULAR | Status: AC
  Administered 2015-06-24: 50 ug via INTRAVENOUS
  Filled 2015-06-24: qty 2

## 2015-06-24 MED ORDER — ONDANSETRON HCL 8 MG PO TABS: 8.0000 mg | ORAL_TABLET | ORAL | Status: DC | PRN

## 2015-06-24 NOTE — ED Notes (Signed)
Pt states that she has been aching all over with vomiting and chills for the past few days.

## 2015-06-24 NOTE — Discharge Instructions (Signed)
Increase fluids. Medication for nausea. Rest. °

## 2015-06-24 NOTE — ED Provider Notes (Signed)
CSN: [id number]     Arrival date & time 06/24/15  1651 History   First MD Initiated Contact with Patient 06/24/15 1710     Chief Complaint  Patient presents with  . General Complaint      (Consider location/radiation/quality/duration/timing/severity/associated sxs/prior Treatment) HPI...... Nausea, vomiting, chills or 2-3 days with associated headache. No meningeal signs or fever. She feels slightly dehydrated. No chest pain, dyspnea, dysuria. Severity is mild-to-moderate.  Past Medical History  Diagnosis Date  . Abnormal Pap smear   . Anxiety   . Depression   . Bipolar 1 disorder (HCC)   . UTI (urinary tract infection)   . Pregnancy   . IUD (intrauterine device) in place 04/12/2013    Had IUD inserted 03/29/13 could not feel strings went to ER 9/18 and KUB saw IUD still could not see string, can't see now but seen in [place]  . Kidney infection    Past Surgical History  Procedure Laterality Date  . Tooth extraction    . Cesarean section     Family History  Problem Relation Age of Onset  . Hypertension Father   . Diabetes Father   . Diabetes Paternal Aunt   . Diabetes Paternal Uncle   . Hypertension Paternal Grandmother   . Diabetes Paternal Grandmother   . Diabetes Paternal Grandfather    Social History  Substance Use Topics  . Smoking status: Former Smoker    Types: Cigarettes    Quit date: 05/21/2007  . Smokeless tobacco: Never Used  . Alcohol Use: No     Comment: occ.    OB History    Gravida Para Term Preterm AB TAB SAB Ectopic Multiple Living   4 2 1 1 2  0 2 0 0 2     Review of Systems  All other systems reviewed and are negative.     Allergies  Shellfish allergy  Home Medications   Prior to Admission medications   Medication Sig Start Date End Date Taking? Authorizing Provider  aspirin-acetaminophen-caffeine (EXCEDRIN MIGRAINE) [phone] MG tablet Take 2 tablets by mouth once.   Yes Historical Provider, MD  levonorgestrel (MIRENA) 20 MCG/24HR IUD 1  each by Intrauterine route once.   Yes Historical Provider, MD  albuterol (PROVENTIL HFA;VENTOLIN HFA) 108 (90 BASE) MCG/ACT inhaler Inhale 2 puffs into the lungs every 6 (six) hours as needed for wheezing or shortness of breath. Patient not taking: Reported on 06/24/2015 06/14/13   Acey LavAllison L Wood, MD  azithromycin (ZITHROMAX) 250 MG tablet Take 1 tablet (250 mg total) by mouth daily. Take first 2 tablets together, then 1 every day until finished. Patient not taking: Reported on 06/24/2015 05/06/15   Zadie Rhineonald Wickline, MD  naproxen (NAPROSYN) 500 MG tablet Take 1 tablet (500 mg total) by mouth 2 (two) times daily with a meal. Patient not taking: Reported on 06/24/2015 12/30/14   Tammy Triplett, PA-C  ondansetron (ZOFRAN) 8 MG tablet Take 1 tablet (8 mg total) by mouth every 4 (four) hours as needed. 06/24/15   Donnetta HutchingBrian Lynnsie Linders, MD   BP 136/82 mmHg  Pulse 89  Temp(Src) 97.7 F (36.5 C) (Oral)  Resp 16  Ht 5\' 9"  (1.753 m)  Wt 380 lb (172.367 kg)  BMI 56.09 kg/m2  SpO2 100% Physical Exam  Constitutional: She is oriented to person, place, and time.  Morbidly obese, slightly dehydrated  HENT:  Head: Normocephalic and atraumatic.  Eyes: Conjunctivae and EOM are normal. Pupils are equal, round, and reactive to light.  Neck: Normal range of  motion. Neck supple.  Cardiovascular: Normal rate and regular rhythm.   Pulmonary/Chest: Effort normal and breath sounds normal.  Abdominal: Soft. Bowel sounds are normal.  Musculoskeletal: Normal range of motion.  Neurological: She is alert and oriented to person, place, and time.  Skin: Skin is warm and dry.  Psychiatric: She has a normal mood and affect. Her behavior is normal.  Nursing note and vitals reviewed.   ED Course  Procedures (including critical care time) Labs Review Labs Reviewed  CBC WITH DIFFERENTIAL/PLATELET - Abnormal; Notable for the following:    WBC 12.1 (*)    MCH 25.3 (*)    Neutro Abs 8.9 (*)    All other components within normal  limits  COMPREHENSIVE METABOLIC PANEL - Abnormal; Notable for the following:    Glucose, Bld 109 (*)    Calcium 8.7 (*)    Albumin 3.4 (*)    All other components within normal limits  URINALYSIS, ROUTINE W REFLEX MICROSCOPIC (NOT AT [facility]) - Abnormal; Notable for the following:    APPearance HAZY (*)    All other components within normal limits  PREGNANCY, URINE    Imaging Review No results found. I have personally reviewed and evaluated these images and lab results as part of my medical decision-making.   EKG Interpretation None      MDM   Final diagnoses:  Gastroenteritis    No acute abdomen. Patient feels better after 2 L of IV fluids. Labs are reassuring. Discharge medications Zofran 8 mg.    Donnetta Hutching, MD 06/24/15 2102

## 2015-07-06 ENCOUNTER — Emergency Department (HOSPITAL_COMMUNITY)
Admission: EM | Admit: 2015-07-06 | Discharge: 2015-07-06 | Disposition: A | Discharge status: Home / Self Care | Payer: Self-pay | Attending: Emergency Medicine | Admitting: Emergency Medicine

## 2015-07-06 ENCOUNTER — Encounter (HOSPITAL_COMMUNITY): Payer: Self-pay | Admitting: Emergency Medicine

## 2015-07-06 DIAGNOSIS — Z87891 Personal history of nicotine dependence: Secondary | ICD-10-CM | POA: Insufficient documentation

## 2015-07-06 DIAGNOSIS — Z87448 Personal history of other diseases of urinary system: Secondary | ICD-10-CM | POA: Insufficient documentation

## 2015-07-06 DIAGNOSIS — Z8659 Personal history of other mental and behavioral disorders: Secondary | ICD-10-CM | POA: Insufficient documentation

## 2015-07-06 DIAGNOSIS — Z87440 Personal history of urinary (tract) infections: Secondary | ICD-10-CM | POA: Insufficient documentation

## 2015-07-06 DIAGNOSIS — K0889 Other specified disorders of teeth and supporting structures: Secondary | ICD-10-CM | POA: Insufficient documentation

## 2015-07-06 MED ORDER — OXYCODONE-ACETAMINOPHEN 5-325 MG PO TABS: 1.0000 | ORAL_TABLET | ORAL | Status: DC | PRN

## 2015-07-06 MED ORDER — OXYCODONE-ACETAMINOPHEN 5-325 MG PO TABS
1.0000 | ORAL_TABLET | Freq: Once | ORAL | Status: AC
  Administered 2015-07-06: 1 via ORAL
  Filled 2015-07-06: qty 1

## 2015-07-06 MED ORDER — IBUPROFEN 800 MG PO TABS
800.0000 mg | ORAL_TABLET | Freq: Once | ORAL | Status: AC
  Administered 2015-07-06: 800 mg via ORAL
  Filled 2015-07-06: qty 1

## 2015-07-06 MED ORDER — AMOXICILLIN 500 MG PO CAPS: 1000.0000 mg | ORAL_CAPSULE | Freq: Two times a day (BID) | ORAL | Status: DC

## 2015-07-06 MED ORDER — AMOXICILLIN 250 MG PO CAPS
1000.0000 mg | ORAL_CAPSULE | Freq: Once | ORAL | Status: AC
  Administered 2015-07-06: 1000 mg via ORAL
  Filled 2015-07-06: qty 4

## 2015-07-06 NOTE — ED Notes (Signed)
Pt c/o dental pain since yesterday from broken tooth.

## 2015-07-06 NOTE — Discharge Instructions (Signed)
Dental Pain Dental pain may be caused by many things, including:  Tooth decay (cavities or caries). Cavities expose the nerve of your tooth to air and hot or cold temperatures. This can cause pain or discomfort.  Abscess or infection. A dental abscess is a collection of infected pus from a bacterial infection in the inner part of the tooth (pulp). It usually occurs at the end of the tooth's root.  Injury.  An unknown reason (idiopathic). Your pain may be mild or severe. It may only occur when:  You are chewing.  You are exposed to hot or cold temperature.  You are eating or drinking sugary foods or beverages, such as soda or candy. Your pain may also be constant. HOME CARE INSTRUCTIONS Watch your dental pain for any changes. The following actions may help to lessen any discomfort that you are feeling:  Take medicines only as directed by your dentist.  If you were prescribed an antibiotic medicine, finish all of it even if you start to feel better.  Keep all follow-up visits as directed by your dentist. This is important.  Do not apply heat to the outside of your face.  Rinse your mouth or gargle with salt water if directed by your dentist. This helps with pain and swelling.  You can make salt water by adding  tsp of salt to 1 cup of warm water.  Apply ice to the painful area of your face:  Put ice in a plastic bag.  Place a towel between your skin and the bag.  Leave the ice on for 20 minutes, 2-3 times per day.  Avoid foods or drinks that cause you pain, such as:  Very hot or very cold foods or drinks.  Sweet or sugary foods or drinks. SEEK MEDICAL CARE IF:  Your pain is not controlled with medicines.  Your symptoms are worse.  You have new symptoms. SEEK IMMEDIATE MEDICAL CARE IF:  You are unable to open your mouth.  You are having trouble breathing or swallowing.  You have a fever.  Your face, neck, or jaw is swollen.   This information is not  intended to replace advice given to you by your health care provider. Make sure you discuss any questions you have with your health care provider.   Document Released: 07/08/2005 Document Revised: 11/22/2014 Document Reviewed: 07/04/2014 Elsevier Interactive Patient Education 2016 Elsevier Inc.  Amoxicillin capsules or tablets What is this medicine? AMOXICILLIN (a mox i SIL in) is a penicillin antibiotic. It is used to treat certain kinds of bacterial infections. It will not work for colds, flu, or other viral infections. This medicine may be used for other purposes; ask your health care provider or pharmacist if you have questions. What should I tell my health care provider before I take this medicine? They need to know if you have any of these conditions: -asthma -kidney disease -an unusual or allergic reaction to amoxicillin, other penicillins, cephalosporin antibiotics, other medicines, foods, dyes, or preservatives -pregnant or trying to get pregnant -breast-feeding How should I use this medicine? Take this medicine by mouth with a glass of water. Follow the directions on your prescription label. You may take this medicine with food or on an empty stomach. Take your medicine at regular intervals. Do not take your medicine more often than directed. Take all of your medicine as directed even if you think your are better. Do not skip doses or stop your medicine early. Talk to your pediatrician regarding the use  of this medicine in children. While this drug may be prescribed for selected conditions, precautions do apply. Overdosage: If you think you have taken too much of this medicine contact a poison control center or emergency room at once. NOTE: This medicine is only for you. Do not share this medicine with others. What if I miss a dose? If you miss a dose, take it as soon as you can. If it is almost time for your next dose, take only that dose. Do not take double or extra doses. What  may interact with this medicine? -amiloride -birth control pills -chloramphenicol -macrolides -probenecid -sulfonamides -tetracyclines This list may not describe all possible interactions. Give your health care provider a list of all the medicines, herbs, non-prescription drugs, or dietary supplements you use. Also tell them if you smoke, drink alcohol, or use illegal drugs. Some items may interact with your medicine. What should I watch for while using this medicine? Tell your doctor or health care professional if your symptoms do not improve in 2 or 3 days. Take all of the doses of your medicine as directed. Do not skip doses or stop your medicine early. If you are diabetic, you may get a false positive result for sugar in your urine with certain brands of urine tests. Check with your doctor. Do not treat diarrhea with over-the-counter products. Contact your doctor if you have diarrhea that lasts more than 2 days or if the diarrhea is severe and watery. What side effects may I notice from receiving this medicine? Side effects that you should report to your doctor or health care professional as soon as possible: -allergic reactions like skin rash, itching or hives, swelling of the face, lips, or tongue -breathing problems -dark urine -redness, blistering, peeling or loosening of the skin, including inside the mouth -seizures -severe or watery diarrhea -trouble passing urine or change in the amount of urine -unusual bleeding or bruising -unusually weak or tired -yellowing of the eyes or skin Side effects that usually do not require medical attention (report to your doctor or health care professional if they continue or are bothersome): -dizziness -headache -stomach upset -trouble sleeping This list may not describe all possible side effects. Call your doctor for medical advice about side effects. You may report side effects to FDA at 1-800-FDA-1088. Where should I keep my  medicine? Keep out of the reach of children. Store between 68 and 77 degrees F (20 and 25 degrees C). Keep bottle closed tightly. Throw away any unused medicine after the expiration date. NOTE: This sheet is a summary. It may not cover all possible information. If you have questions about this medicine, talk to your doctor, pharmacist, or health care provider.    2016, Elsevier/Gold Standard. (2007-09-29 14:10:59)  Acetaminophen; Oxycodone tablets What is this medicine? ACETAMINOPHEN; OXYCODONE (a set a MEE noe fen; ox i KOE done) is a pain reliever. It is used to treat moderate to severe pain. This medicine may be used for other purposes; ask your health care provider or pharmacist if you have questions. What should I tell my health care provider before I take this medicine? They need to know if you have any of these conditions: -brain tumor -Crohn's disease, inflammatory bowel disease, or ulcerative colitis -drug abuse or addiction -head injury -heart or circulation problems -if you often drink alcohol -kidney disease or problems going to the bathroom -liver disease -lung disease, asthma, or breathing problems -an unusual or allergic reaction to acetaminophen, oxycodone, other opioid analgesics,  other medicines, foods, dyes, or preservatives -pregnant or trying to get pregnant -breast-feeding How should I use this medicine? Take this medicine by mouth with a full glass of water. Follow the directions on the prescription label. You can take it with or without food. If it upsets your stomach, take it with food. Take your medicine at regular intervals. Do not take it more often than directed. Talk to your pediatrician regarding the use of this medicine in children. Special care may be needed. Patients over 3 years old may have a stronger reaction and need a smaller dose. Overdosage: If you think you have taken too much of this medicine contact a poison control center or emergency room  at once. NOTE: This medicine is only for you. Do not share this medicine with others. What if I miss a dose? If you miss a dose, take it as soon as you can. If it is almost time for your next dose, take only that dose. Do not take double or extra doses. What may interact with this medicine? -alcohol -antihistamines -barbiturates like amobarbital, butalbital, butabarbital, methohexital, pentobarbital, phenobarbital, thiopental, and secobarbital -benztropine -drugs for bladder problems like solifenacin, trospium, oxybutynin, tolterodine, hyoscyamine, and methscopolamine -drugs for breathing problems like ipratropium and tiotropium -drugs for certain stomach or intestine problems like propantheline, homatropine methylbromide, glycopyrrolate, atropine, belladonna, and dicyclomine -general anesthetics like etomidate, ketamine, nitrous oxide, propofol, desflurane, enflurane, halothane, isoflurane, and sevoflurane -medicines for depression, anxiety, or psychotic disturbances -medicines for sleep -muscle relaxants -naltrexone -narcotic medicines (opiates) for pain -phenothiazines like perphenazine, thioridazine, chlorpromazine, mesoridazine, fluphenazine, prochlorperazine, promazine, and trifluoperazine -scopolamine -tramadol -trihexyphenidyl This list may not describe all possible interactions. Give your health care provider a list of all the medicines, herbs, non-prescription drugs, or dietary supplements you use. Also tell them if you smoke, drink alcohol, or use illegal drugs. Some items may interact with your medicine. What should I watch for while using this medicine? Tell your doctor or health care professional if your pain does not go away, if it gets worse, or if you have new or a different type of pain. You may develop tolerance to the medicine. Tolerance means that you will need a higher dose of the medication for pain relief. Tolerance is normal and is expected if you take this medicine  for a long time. Do not suddenly stop taking your medicine because you may develop a severe reaction. Your body becomes used to the medicine. This does NOT mean you are addicted. Addiction is a behavior related to getting and using a drug for a non-medical reason. If you have pain, you have a medical reason to take pain medicine. Your doctor will tell you how much medicine to take. If your doctor wants you to stop the medicine, the dose will be slowly lowered over time to avoid any side effects. You may get drowsy or dizzy. Do not drive, use machinery, or do anything that needs mental alertness until you know how this medicine affects you. Do not stand or sit up quickly, especially if you are an older patient. This reduces the risk of dizzy or fainting spells. Alcohol may interfere with the effect of this medicine. Avoid alcoholic drinks. There are different types of narcotic medicines (opiates) for pain. If you take more than one type at the same time, you may have more side effects. Give your health care provider a list of all medicines you use. Your doctor will tell you how much medicine to take. Do not take  more medicine than directed. Call emergency for help if you have problems breathing. The medicine will cause constipation. Try to have a bowel movement at least every 2 to 3 days. If you do not have a bowel movement for 3 days, call your doctor or health care professional. Do not take Tylenol (acetaminophen) or medicines that have acetaminophen with this medicine. Too much acetaminophen can be very dangerous. Many nonprescription medicines contain acetaminophen. Always read the labels carefully to avoid taking more acetaminophen. What side effects may I notice from receiving this medicine? Side effects that you should report to your doctor or health care professional as soon as possible: -allergic reactions like skin rash, itching or hives, swelling of the face, lips, or tongue -breathing  difficulties, wheezing -confusion -light headedness or fainting spells -severe stomach pain -unusually weak or tired -yellowing of the skin or the whites of the eyes Side effects that usually do not require medical attention (report to your doctor or health care professional if they continue or are bothersome): -dizziness -drowsiness -nausea -vomiting This list may not describe all possible side effects. Call your doctor for medical advice about side effects. You may report side effects to FDA at 1-800-FDA-1088. Where should I keep my medicine? Keep out of the reach of children. This medicine can be abused. Keep your medicine in a safe place to protect it from theft. Do not share this medicine with anyone. Selling or giving away this medicine is dangerous and against the law. This medicine may cause accidental overdose and death if it taken by other adults, children, or pets. Mix any unused medicine with a substance like cat litter or coffee grounds. Then throw the medicine away in a sealed container like a sealed bag or a coffee can with a lid. Do not use the medicine after the expiration date. Store at room temperature between 20 and 25 degrees C (68 and 77 degrees F). NOTE: This sheet is a summary. It may not cover all possible information. If you have questions about this medicine, talk to your doctor, pharmacist, or health care provider.    2016, Elsevier/Gold Standard. (2014-06-08 15:18:46)   Emergency Department Resource Guide  Dental Care: Organization         Address  Phone  Notes  [facility] Department of [facility] [address] [place], [place] [phone] Accepts children up to age 20 who are enrolled in [place] or [place] Health Choice; pregnant women with a Medicaid card; and children who have applied for Medicaid or [place] Health Choice, but were declined, whose parents can pay a reduced fee at time of service.  [facility] Department of  [facility]  [address], [place] [phone] Accepts children up to age 34 who are enrolled in [place] or [place] Health Choice; pregnant women with a Medicaid card; and children who have applied for Medicaid or  Health Choice, but were declined, whose parents can pay a reduced fee at time of service.  [facility]  [address] [place], [place] [phone] Patients are seen by appointment only. Walk-ins are not accepted. [facility] will see patients 38 years of age and older. Monday - Tuesday (8am-5pm) Most Wednesdays (8:30-5pm) $30 per visit, cash only  [facility]  [address], [facility] [phone] Patients are seen by appointment only. Walk-ins are not accepted. [facility] will see patients 68 years of age and  older. One Wednesday Evening (Monthly: Volunteer Based).  $30 per visit, cash only  [employer]  [phone] for adults; Children under age 62, call Graduate Pediatric Dentistry at [phone]. Children aged 26-14, please call [phone] to request a pediatric application.  Dental services are provided in all areas of dental care including fillings, crowns and bridges, complete and partial dentures, implants, gum treatment, root canals, and extractions. Preventive care is also provided. Treatment is provided to both adults and children. Patients are selected via a lottery and there is often a waiting list.   [facility] [address], [place]  [phone] [email or web address]   [facility] [address] [place], [place] [phone], Ext. [phone] Second and Fourth Thursday of each month, opens at 6:30 AM; Clinic ends at 9 AM.  Patients are seen on a first-come first-served basis, and a limited number are seen during each clinic.   [facility]  [address] [place], [place] [phone]   Eligibility  Requirements You must have lived in [place], [place], or [place] counties for at least the last three months.   You cannot be eligible for state or federal sponsored [place], including [employer], [place], or [employer].   You generally cannot be eligible for healthcare insurance through your employer.    How to apply: Eligibility screenings are held every Tuesday and Wednesday afternoon from 1:00 pm until 4:00 pm. You do not need an appointment for the interview!  [facility] [address] [place], [place] [phone]   [facility]  [phone]   [facility]  [phone]   [facility] Department  [phone]

## 2015-07-06 NOTE — ED Provider Notes (Signed)
CSN: [id number]     Arrival date & time 07/06/15  [id number] History   First MD Initiated Contact with Patient 07/06/15 [phone]     Chief Complaint  Patient presents with  . Dental Pain     (Consider location/radiation/quality/duration/timing/severity/associated sxs/prior Treatment) The history is provided by the patient.  29 -year-old female comes in with pain in the left upper tooth which has been present for the last week, but got much worse over the last 36 hours. She states she broke a tooth about one year ago. She's been taking BC powder, acetaminophen, ibuprofen with no relief. She denies fever or chills. She has noted some foul-tasting drainage. She has been unable to get into see a dentist. She rates pain at 10/10.  Past Medical History  Diagnosis Date  . Abnormal Pap smear   . Anxiety   . Depression   . Bipolar 1 disorder (HCC)   . UTI (urinary tract infection)   . Pregnancy   . IUD (intrauterine device) in place 04/12/2013    Had IUD inserted 03/29/13 could not feel strings went to ER 9/18 and KUB saw IUD still could not see string, can't see now but seen in US  . Kidney infection    Past Surgical History  Procedure Laterality Date  . Tooth extraction    . Cesarean section     Family History  Problem Relation Age of Onset  . Hypertension Father   . Diabetes Father   . Diabetes Paternal Aunt   . Diabetes Paternal Uncle   . Hypertension Paternal Grandmother   . Diabetes Paternal Grandmother   . Diabetes Paternal Grandfather    Social History  Substance Use Topics  . Smoking status: Former Smoker    Types: Cigarettes    Quit date: 05/21/2007  . Smokeless tobacco: Never Used  . Alcohol Use: No     Comment: occ.    OB History    Gravida Para Term Preterm AB TAB SAB Ectopic Multiple Living   4 2 1 1 2  0 2 0 0 2     Review of Systems  All other systems reviewed and are negative.     Allergies  Shellfish allergy  Home Medications   Prior to Admission  medications   Medication Sig Start Date End Date Taking? Authorizing Provider  albuterol (PROVENTIL HFA;VENTOLIN HFA) 108 (90 BASE) MCG/ACT inhaler Inhale 2 puffs into the lungs every 6 (six) hours as needed for wheezing or shortness of breath. Patient not taking: Reported on 06/24/2015 06/14/13   Acey LavAllison L Wood, MD  aspirin-acetaminophen-caffeine [facility](EXCEDRIN MIGRAINE) [id number] MG tablet Take 2 tablets by mouth once.    Historical Provider, MD  azithromycin (ZITHROMAX) 250 MG tablet Take 1 tablet (250 mg total) by mouth daily. Take first 2 tablets together, then 1 every day until finished. Patient not taking: Reported on 06/24/2015 05/06/15   Zadie Rhineonald Wickline, MD  levonorgestrel [facility](MIRENA) 20 MCG/24HR IUD 1 each by Intrauterine route once.    Historical Provider, MD  naproxen (NAPROSYN) 500 MG tablet Take 1 tablet (500 mg total) by mouth 2 (two) times daily with a meal. Patient not taking: Reported on 06/24/2015 12/30/14   Tammy Triplett, PA-C  ondansetron (ZOFRAN) 8 MG tablet Take 1 tablet (8 mg total) by mouth every 4 (four) hours as needed. 06/24/15   Donnetta HutchingBrian Cook, MD   BP 136/87 mmHg  Pulse 89  Temp(Src) 98.7 F (37.1 C)  Resp 18  Ht 5\' 8"  (1.727 m)  Wt 380  lb (172.367 kg)  BMI 57.79 kg/m2  SpO2 99% Physical Exam  Nursing note and vitals reviewed.  29 year old female, resting comfortably and in no acute distress. Vital signs are normal. Oxygen saturation is 99%, which is normal. Head is normocephalic and atraumatic. PERRLA, EOMI. Oropharynx is clear. There is some gingival swelling and tenderness adjacent to tooth #21 and there is tenderness to percussion over that tooth. Remainder of dentition appears to be in good condition. Neck is nontender and supple without adenopathy or JVD. Back is nontender and there is no CVA tenderness. Lungs are clear without rales, wheezes, or rhonchi. Chest is nontender. Heart has regular rate and rhythm without murmur. Abdomen is soft, flat, nontender without  masses or hepatosplenomegaly and peristalsis is normoactive. Extremities have no cyanosis or edema, full range of motion is present. Skin is warm and dry without rash. Neurologic: Mental status is normal, cranial nerves are intact, there are no motor or sensory deficits.  ED Course  Procedures (including critical care time)  MDM   Final diagnoses:  Pain, dental    Dental pain, possible early abscess. She is discharged with prescription for amoxicillin and oxycodone-acetaminophen and is advised to use ibuprofen or naproxen for pain. She is given dental resources from the resource guide.    Dione Booze, MD 07/06/15 [phone]

## 2015-07-27 ENCOUNTER — Encounter (HOSPITAL_COMMUNITY): Payer: Self-pay | Admitting: Emergency Medicine

## 2015-07-27 ENCOUNTER — Emergency Department (HOSPITAL_COMMUNITY)
Admission: EM | Admit: 2015-07-27 | Discharge: 2015-07-28 | Disposition: A | Discharge status: Home / Self Care | Payer: Managed Care, Other (non HMO) | Attending: Emergency Medicine | Admitting: Emergency Medicine

## 2015-07-27 ENCOUNTER — Emergency Department (HOSPITAL_COMMUNITY): Payer: Managed Care, Other (non HMO)

## 2015-07-27 DIAGNOSIS — Z87448 Personal history of other diseases of urinary system: Secondary | ICD-10-CM | POA: Insufficient documentation

## 2015-07-27 DIAGNOSIS — Z792 Long term (current) use of antibiotics: Secondary | ICD-10-CM | POA: Diagnosis not present

## 2015-07-27 DIAGNOSIS — J9801 Acute bronchospasm: Secondary | ICD-10-CM

## 2015-07-27 DIAGNOSIS — R000 Tachycardia, unspecified: Secondary | ICD-10-CM | POA: Insufficient documentation

## 2015-07-27 DIAGNOSIS — R05 Cough: Secondary | ICD-10-CM | POA: Diagnosis present

## 2015-07-27 DIAGNOSIS — J069 Acute upper respiratory infection, unspecified: Secondary | ICD-10-CM

## 2015-07-27 DIAGNOSIS — Z8659 Personal history of other mental and behavioral disorders: Secondary | ICD-10-CM | POA: Diagnosis not present

## 2015-07-27 DIAGNOSIS — Z87440 Personal history of urinary (tract) infections: Secondary | ICD-10-CM | POA: Insufficient documentation

## 2015-07-27 DIAGNOSIS — Z87891 Personal history of nicotine dependence: Secondary | ICD-10-CM | POA: Diagnosis not present

## 2015-07-27 NOTE — ED Notes (Signed)
Pt c/o cough, headache, and nausea x one week.

## 2015-07-28 MED ORDER — PREDNISONE 20 MG PO TABS: 40.0000 mg | ORAL_TABLET | Freq: Every day | ORAL | Status: DC

## 2015-07-28 MED ORDER — ALBUTEROL SULFATE (2.5 MG/3ML) 0.083% IN NEBU
2.5000 mg | INHALATION_SOLUTION | Freq: Once | RESPIRATORY_TRACT | Status: AC
  Administered 2015-07-28: 2.5 mg via RESPIRATORY_TRACT
  Filled 2015-07-28: qty 3

## 2015-07-28 MED ORDER — BENZONATATE 200 MG PO CAPS: 200.0000 mg | ORAL_CAPSULE | Freq: Three times a day (TID) | ORAL | Status: DC | PRN

## 2015-07-28 MED ORDER — SODIUM CHLORIDE 0.9 % IV BOLUS (SEPSIS)
1000.0000 mL | Freq: Once | INTRAVENOUS | Status: AC
  Administered 2015-07-28: 1000 mL via INTRAVENOUS

## 2015-07-28 MED ORDER — PREDNISONE 50 MG PO TABS
60.0000 mg | ORAL_TABLET | Freq: Once | ORAL | Status: AC
  Administered 2015-07-28: 60 mg via ORAL
  Filled 2015-07-28: qty 1

## 2015-07-28 MED ORDER — ALBUTEROL SULFATE HFA 108 (90 BASE) MCG/ACT IN AERS: 1.0000 | INHALATION_SPRAY | Freq: Four times a day (QID) | RESPIRATORY_TRACT | Status: DC | PRN

## 2015-07-28 MED ORDER — IPRATROPIUM-ALBUTEROL 0.5-2.5 (3) MG/3ML IN SOLN
3.0000 mL | Freq: Once | RESPIRATORY_TRACT | Status: AC
  Administered 2015-07-28: 3 mL via RESPIRATORY_TRACT
  Filled 2015-07-28: qty 3

## 2015-07-28 NOTE — ED Provider Notes (Signed)
**Note [name]-Identified via Obfuscation** CSN: [id number]     Arrival date & time 07/27/15  2236 History   First MD Initiated Contact with Patient 07/27/15 2354     Chief Complaint  Patient presents with  . Cough     (Consider location/radiation/quality/duration/timing/severity/associated sxs/prior Treatment) HPI   [name] [name] is a 30 y.o. female who presents to the Emergency Department complaining of cough, nasal congestion, chest tightness, body aches and nausea for one week.  She states the cough has been mostly non-productive and associated with clear to yellow nasal drainage.  She has been taking Mucinex without relief.  She reports some wheezing and tightness to the upper chest associated with excessive cough.  She denies fever, chills, chest pain, abdominal pain, N/V.  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      Past Medical History  Diagnosis Date  . Abnormal Pap smear   . Anxiety   . Depression   . Bipolar 1 disorder (HCC)   . UTI (urinary tract infection)   . Pregnancy   . IUD (intrauterine device) in place 04/12/2013    Had IUD inserted 03/29/13 could not feel strings went to ER 9/18 and KUB saw IUD still could not see string, can't see now but seen in US  . Kidney infection    Past Surgical History  Procedure Laterality Date  . Tooth extraction    . Cesarean section     Family History  Problem Relation Age of Onset  . Hypertension Father   . Diabetes Father   . Diabetes Paternal Aunt   . Diabetes Paternal Uncle   . Hypertension Paternal Grandmother   . Diabetes Paternal Grandmother   . Diabetes Paternal Grandfather    Social History  Substance Use Topics  . Smoking status: Former Smoker    Types: Cigarettes    Quit date: 05/21/2007  . Smokeless tobacco: Never Used  . Alcohol Use: No     Comment: occ.    OB History    Gravida Para Term Preterm AB TAB SAB Ectopic Multiple Living   4 2 1 1 2  0 2 0 0 2     Review of Systems   Constitutional: Negative for fever, chills, activity change and appetite change.  HENT: Positive for congestion and rhinorrhea. Negative for facial swelling, sore throat and trouble swallowing.   Eyes: Negative for visual disturbance.  Respiratory: Positive for cough, chest tightness and wheezing. Negative for shortness of breath and stridor.   Gastrointestinal: Negative for nausea, vomiting, abdominal pain and abdominal distention.  Genitourinary: Negative for dysuria.  Musculoskeletal: Positive for myalgias. Negative for neck pain and neck stiffness.  Skin: Negative.  Negative for rash.  Neurological: Positive for headaches. Negative for dizziness, weakness and numbness.  Hematological: Negative for adenopathy.  Psychiatric/Behavioral: Negative for confusion.  All other systems reviewed and are negative.     Allergies  Shellfish allergy  Home Medications   Prior to Admission medications   Medication Sig Start Date End Date Taking? Authorizing Provider  amoxicillin (AMOXIL) 500 MG capsule Take 2 capsules (1,000 mg total) by mouth 2 (two) times daily. 07/06/15   Dione Boozeavid Glick, MD  aspirin-acetaminophen-caffeine (EXCEDRIN MIGRAINE) [phone] MG tablet Take 2 tablets by mouth once.    Historical Provider, MD  levonorgestrel (MIRENA) 20 MCG/24HR IUD 1 each by Intrauterine route once.    Historical Provider, MD  oxyCODONE-acetaminophen (PERCOCET/ROXICET) 5-325 MG tablet Take 1 tablet by mouth every 4 (four)  hours as needed. 07/06/15   Dione Booze, MD   BP 132/81 mmHg  Pulse 112  Temp(Src) 97.8 F (36.6 C) (Oral)  Resp 22  Ht 5\' 8"  (1.727 m)  Wt 172.367 kg  BMI 57.79 kg/m2  SpO2 99% Physical Exam  Constitutional: She is oriented to person, place, and time. She appears well-developed and well-nourished. No distress.  HENT:  Head: Normocephalic and atraumatic.  Right Ear: Tympanic membrane and ear canal normal.  Left Ear: Tympanic membrane and ear canal normal.  Nose: Mucosal  edema and rhinorrhea present.  Mouth/Throat: Uvula is midline, oropharynx is clear and moist and mucous membranes are normal.  Eyes: Conjunctivae are normal. Pupils are equal, round, and reactive to light.  Neck: Normal range of motion. Neck supple.  Cardiovascular: Normal rate, regular rhythm and intact distal pulses.   No murmur heard. Pulmonary/Chest: Effort normal. No respiratory distress. She has wheezes.  Few inspiratory wheezes.  No rales  Abdominal: Soft. She exhibits no distension. There is no tenderness. There is no rebound and no guarding.  Neurological: She is alert and oriented to person, place, and time. Coordination normal.  Skin: Skin is warm and dry. No rash noted.  Psychiatric: She has a normal mood and affect.  Nursing note and vitals reviewed.   ED Course  Procedures (including critical care time) Labs Review Labs Reviewed - No data to display  Imaging Review Dg Chest 2 View  07/27/2015  CLINICAL DATA:  Productive cough, shortness of breath and chest pain for 1 week. EXAM: CHEST  2 VIEW COMPARISON:  09/17/2014 FINDINGS: Low lung volumes persist. Bronchitic changes are stable. The cardiomediastinal contours are normal for technique. Pulmonary vasculature is normal. No consolidation, pleural effusion, or pneumothorax. No acute osseous abnormalities are seen. IMPRESSION: Hypoventilatory chest with stable bronchitic change. No change from prior exam. Electronically Signed   By: Rubye Oaks M.D.   On: 07/27/2015 23:08   I have personally reviewed and evaluated these images and lab results as part of my medical decision-making.   EKG Interpretation None      MDM   Final diagnoses:  None    Patient is resting comfortably.  She was mildly tachycardic upon arrival but has been taking OTC cold medication and admits to not drinking much fluids today.  She has drank po fluids, but was actively wheezing on arrival and given albuterol neb which could keep the heart rate  elevated. Her symptoms are c/w viral process and with recent sick contacts I feel that a PE is a less likely possibility.    I will rehydrate with IVF's.  I feel that pt has been reasonably screened and she agrees to close PMD f/u and return precautions were given.    0200 End of shift, pt signed out to Dr. Devoria Albe who agrees to set pt dispo.     Pauline Aus, PA-C 07/28/15 0206  Devoria Albe, MD 07/28/15 [phone]

## 2015-07-28 NOTE — Discharge Instructions (Signed)
Bronchospasm, Adult A bronchospasm is when the tubes that carry air in and out of your lungs (airways) spasm or tighten. During a bronchospasm it is hard to breathe. This is because the airways get smaller. A bronchospasm can be triggered by: 1. Allergies. These may be to animals, pollen, food, or mold. 2. Infection. This is a common cause of bronchospasm. 3. Exercise. 4. Irritants. These include pollution, cigarette smoke, strong odors, aerosol sprays, and paint fumes. 5. Weather changes. 6. Stress. 7. Being emotional. HOME CARE   Always have a plan for getting help. Know when to call your doctor and local emergency services (911 in the [place] Know where you can get emergency care.  Only take medicines as told by your doctor.  If you were prescribed an inhaler or nebulizer machine, ask your doctor how to use it correctly. Always use a spacer with your inhaler if you were given one.  Stay calm during an attack. Try to relax and breathe more slowly.  Control your home environment:  Change your heating and air conditioning filter at least once a month.  Limit your use of fireplaces and wood stoves.  Do not  smoke. Do not  allow smoking in your home.  Avoid perfumes and fragrances.  Get rid of pests (such as roaches and mice) and their droppings.  Throw away plants if you see mold on them.  Keep your house clean and dust free.  Replace carpet with wood, tile, or vinyl flooring. Carpet can trap dander and dust.  Use allergy-proof pillows, mattress covers, and box spring covers.  Wash bed sheets and blankets every week in hot water. Dry them in a dryer.  Use blankets that are made of polyester or cotton.  Wash hands frequently. GET HELP IF:  You have muscle aches.  You have chest pain.  The thick spit you spit or cough up (sputum) changes from clear or white to yellow, green, gray, or bloody.  The thick spit you spit or cough up gets thicker.  There are problems that  may be related to the medicine you are given such as:  A rash.  Itching.  Swelling.  Trouble breathing. GET HELP RIGHT AWAY IF:  You feel you cannot breathe or catch your breath.  You cannot stop coughing.  Your treatment is not helping you breathe better.  You have very bad chest pain. MAKE SURE YOU:   Understand these instructions.  Will watch your condition.  Will get help right away if you are not doing well or get worse.   This information is not intended to replace advice given to you by your health care provider. Make sure you discuss any questions you have with your health care provider.   Document Released: 05/05/2009 Document Revised: 07/29/2014 Document Reviewed: 12/29/2012 Elsevier Interactive Patient Education 2016 [employer]  How to Use an Inhaler Using your inhaler correctly is very important. Good technique will make sure that the medicine reaches your lungs.  HOW TO USE AN INHALER: 8. Take the cap off the inhaler. 9. If this is the first time using your inhaler, you need to prime it. Shake the inhaler for 5 seconds. Release four puffs into the air, away from your face. Ask your doctor for help if you have questions. 10. Shake the inhaler for 5 seconds. 11. Turn the inhaler so the bottle is above the mouthpiece. 12. Put your pointer finger on top of the bottle. Your thumb holds the bottom of the inhaler. 13.  Open your mouth. 14. Either hold the inhaler away from your mouth (the width of 2 fingers) or place your lips tightly around the mouthpiece. Ask your doctor which way to use your inhaler. 15. Breathe out as much air as possible. 16. Breathe in and push down on the bottle 1 time to release the medicine. You will feel the medicine go in your mouth and throat. 17. Continue to take a deep breath in very slowly. Try to fill your lungs. 18. After you have breathed in completely, hold your breath for 10 seconds. This will help the medicine to settle in  your lungs. If you cannot hold your breath for 10 seconds, hold it for as long as you can before you breathe out. 19. Breathe out slowly, through pursed lips. Whistling is an example of pursed lips. 20. If your doctor has told you to take more than 1 puff, wait at least 15-30 seconds between puffs. This will help you get the best results from your medicine. Do not use the inhaler more than your doctor tells you to. 21. Put the cap back on the inhaler. 22. Follow the directions from your doctor or from the inhaler package about cleaning the inhaler. If you use more than one inhaler, ask your doctor which inhalers to use and what order to use them in. Ask your doctor to help you figure out when you will need to refill your inhaler.  If you use a steroid inhaler, always rinse your mouth with water after your last puff, gargle and spit out the water. Do not swallow the water. GET HELP IF:  The inhaler medicine only partially helps to stop wheezing or shortness of breath.  You are having trouble using your inhaler.  You have some increase in thick spit (phlegm). GET HELP RIGHT AWAY IF:  The inhaler medicine does not help your wheezing or shortness of breath or you have tightness in your chest.  You have dizziness, headaches, or fast heart rate.  You have chills, fever, or night sweats.  You have a large increase of thick spit, or your thick spit is bloody. MAKE SURE YOU:   Understand these instructions.  Will watch your condition.  Will get help right away if you are not doing well or get worse.   This information is not intended to replace advice given to you by your health care provider. Make sure you discuss any questions you have with your health care provider.   Document Released: 04/16/2008 Document Revised: 04/28/2013 Document Reviewed: 02/04/2013 Elsevier Interactive Patient Education [employer]

## 2015-07-28 NOTE — ED Notes (Signed)
Pt alert & oriented x4, stable gait. Patient given discharge instructions, paperwork & prescription(s). Patient  instructed to stop at the registration desk to finish any additional paperwork. Patient verbalized understanding. Pt left department w/ no further questions. 

## 2016-02-27 IMAGING — DX DG ANKLE COMPLETE 3+V*R*
3 series · 3 of 3 positions shown · non-contrast
Comparison: None.

CLINICAL DATA: Recent inversion injury of the ankle, initial
encounter

EXAM:
RIGHT ANKLE - COMPLETE 3+ VIEW

[ankle ap]
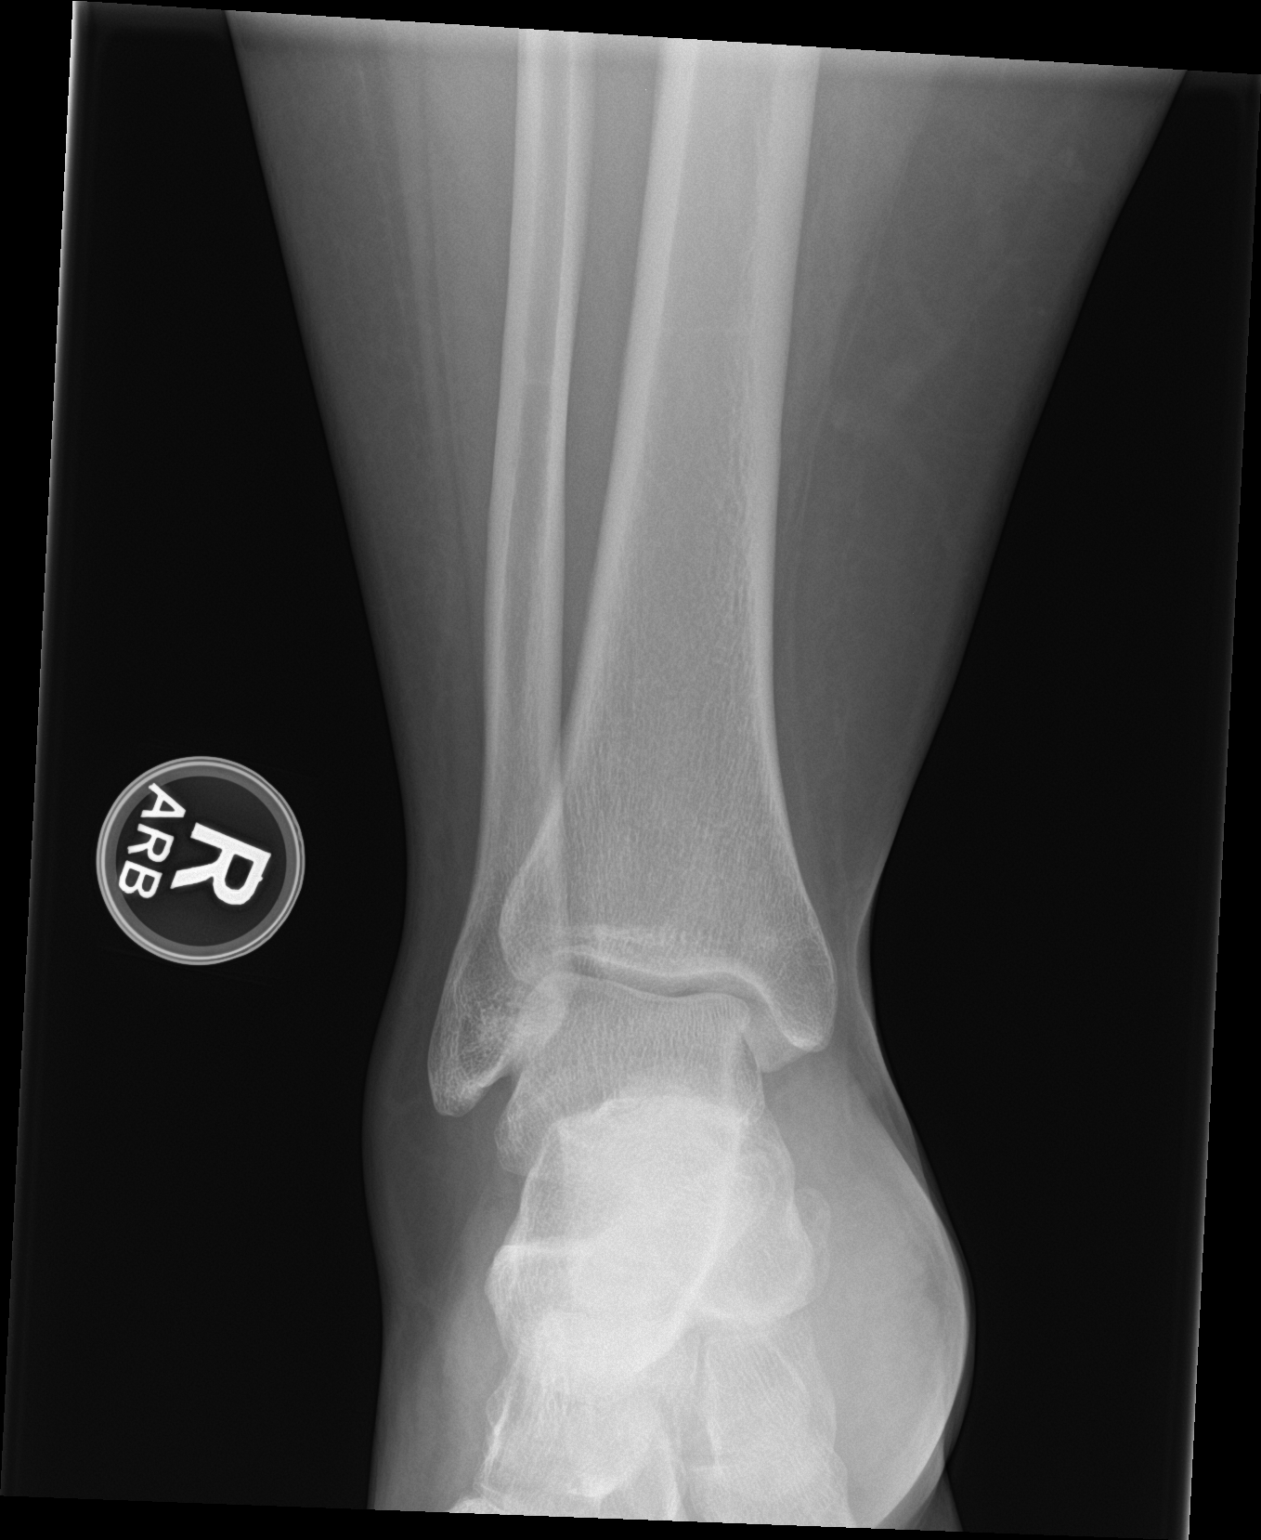

[ankle obl]
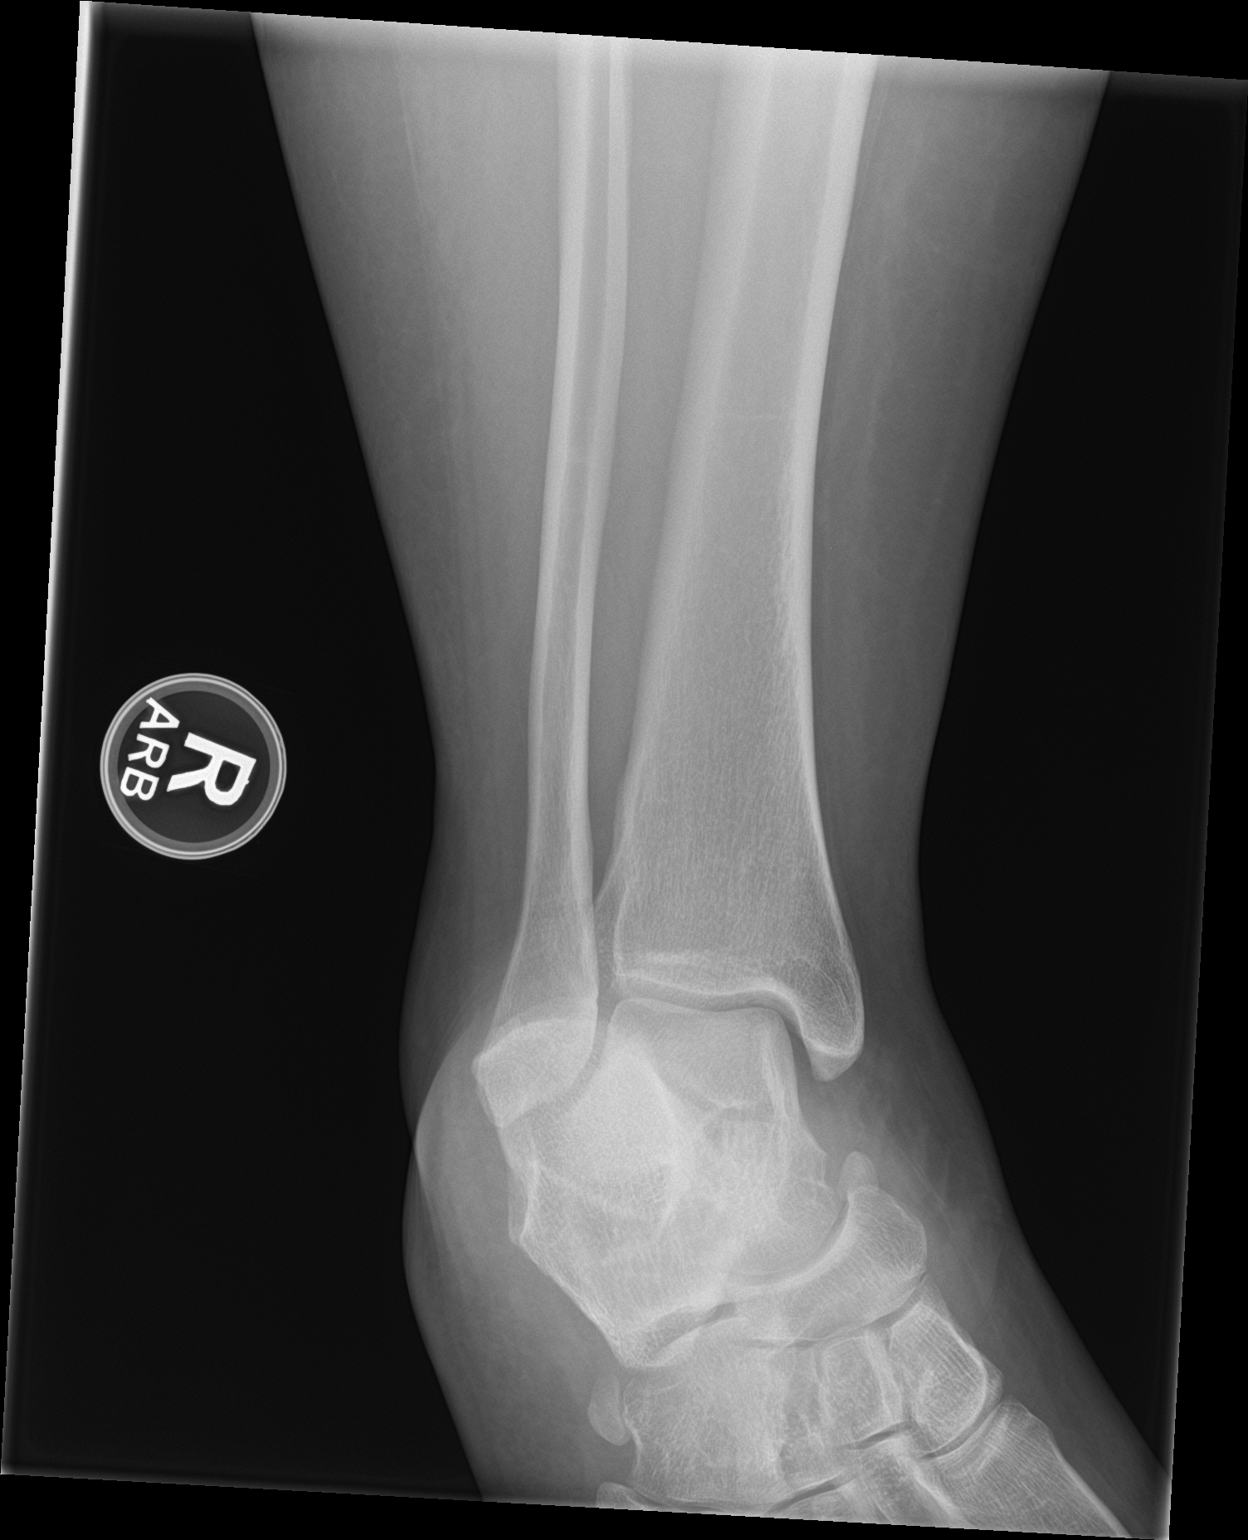

[ankle lat]
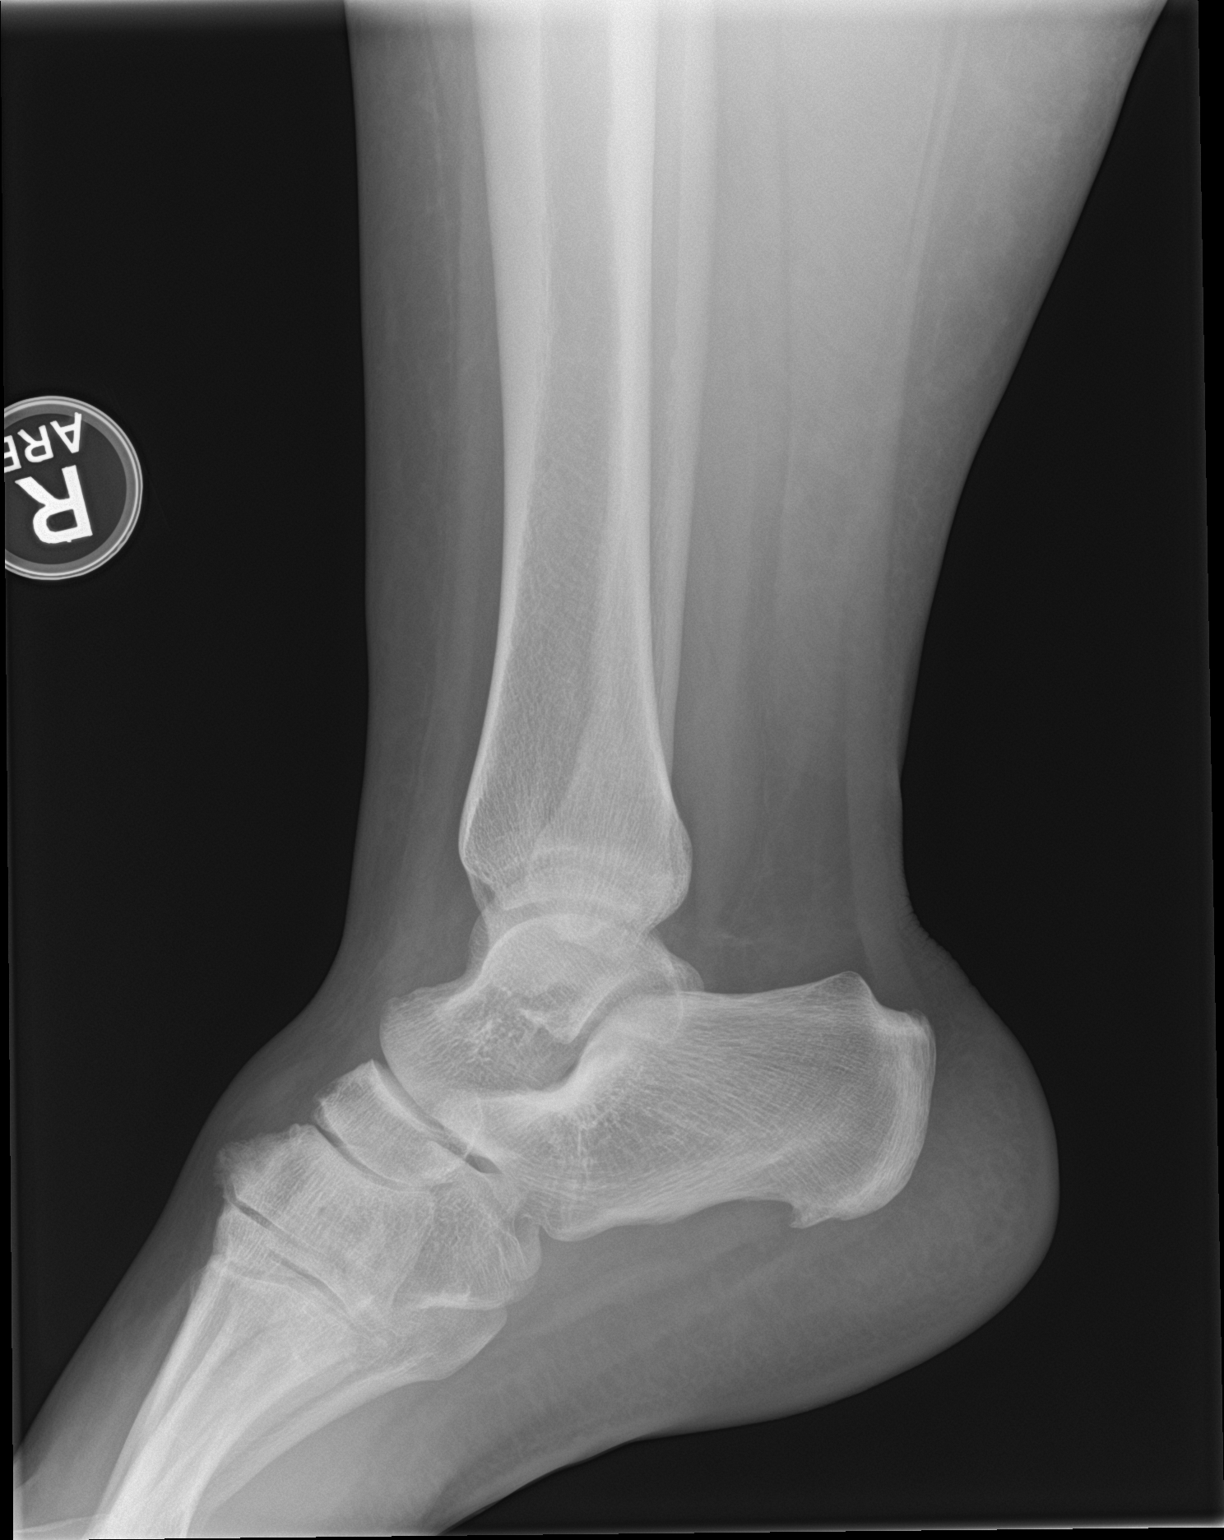

[3 of 3 positions shown; findings below may reference images not displayed]

FINDINGS: Generalized soft tissue swelling is noted consistent with a recent
injury. No acute fracture or dislocation is noted. Some degenerative
changes in the tarsal bones are noted.
IMPRESSION: Soft tissue swelling without acute bony abnormality.

## 2016-06-26 ENCOUNTER — Emergency Department (HOSPITAL_COMMUNITY)
Admission: EM | Admit: 2016-06-26 | Discharge: 2016-06-26 | Disposition: A | Discharge status: Home / Self Care | Payer: Managed Care, Other (non HMO) | Attending: Emergency Medicine | Admitting: Emergency Medicine

## 2016-06-26 ENCOUNTER — Encounter (HOSPITAL_COMMUNITY): Payer: Self-pay | Admitting: Emergency Medicine

## 2016-06-26 DIAGNOSIS — J020 Streptococcal pharyngitis: Secondary | ICD-10-CM | POA: Insufficient documentation

## 2016-06-26 DIAGNOSIS — Z87891 Personal history of nicotine dependence: Secondary | ICD-10-CM | POA: Insufficient documentation

## 2016-06-26 DIAGNOSIS — Z7982 Long term (current) use of aspirin: Secondary | ICD-10-CM | POA: Insufficient documentation

## 2016-06-26 DIAGNOSIS — Z79899 Other long term (current) drug therapy: Secondary | ICD-10-CM | POA: Insufficient documentation

## 2016-06-26 LAB — RAPID STREP SCREEN (MED CTR MEBANE ONLY): STREPTOCOCCUS, GROUP A SCREEN (DIRECT): POSITIVE — AB

## 2016-06-26 MED ORDER — AMOXICILLIN 250 MG PO CAPS
500.0000 mg | ORAL_CAPSULE | Freq: Once | ORAL | Status: AC
  Administered 2016-06-26: 500 mg via ORAL
  Filled 2016-06-26: qty 2

## 2016-06-26 MED ORDER — AMOXICILLIN 500 MG PO CAPS: 500.0000 mg | ORAL_CAPSULE | Freq: Three times a day (TID) | ORAL | 0 refills | Status: AC

## 2016-06-26 NOTE — ED Triage Notes (Signed)
Sore throat for last 3-5 days.  C/o hoarseness.

## 2016-06-26 NOTE — ED Notes (Signed)
Pt alert & oriented x4, stable gait. Patient given discharge instructions, paperwork & prescription(s). Patient  instructed to stop at the registration desk to finish any additional paperwork. Patient verbalized understanding. Pt left department w/ no further questions. 

## 2016-06-26 NOTE — ED Provider Notes (Signed)
**Note [name]-Identified via Obfuscation** [facility]PT Provider Note   CSN: [id number] Arrival date & time: 06/26/16  1444     History   Chief Complaint Chief Complaint  Patient presents with  . Sore Throat    HPI [name] [name] is a 30 y.o. female presenting with a 3 day history of worsening sore throat, subjective fevers, hoarse voice and increased clear post nasal drip.  She denies cough, sob, chest pain, sinus pain or pressure but does endorse ear pain with swallowing.  She has taken ibuprofen with some improvement in pain.  The history is provided by the patient.    Past Medical History:  Diagnosis Date  . Abnormal Pap smear   . Anxiety   . Bipolar 1 disorder (HCC)   . Depression   . IUD (intrauterine device) in place 04/12/2013   Had IUD inserted 03/29/13 could not feel strings went to ER 9/18 and KUB saw IUD still could not see string, can't see now but seen in US  . Kidney infection   . Pregnancy   . UTI (urinary tract infection)     Patient Active Problem List   Diagnosis Date Noted  . Persistent headaches 08/03/2013  . Obesity, morbid, BMI 50 or higher (HCC) 08/03/2013  . Snoring 08/03/2013  . IUD (intrauterine device) in place 04/12/2013  . Pregnancy examination or test, negative result 03/31/2013  . Chlamydia 01/18/2013  . DUB (dysfunctional uterine bleeding) 01/13/2013  . Pyelonephritis 07/18/2011  . Obesity, Class III, BMI 40-49.9 (morbid obesity) (HCC) 07/18/2011    Past Surgical History:  Procedure Laterality Date  . CESAREAN SECTION    . TOOTH EXTRACTION      OB History    Gravida Para Term Preterm AB Living   4 2 1 1 2 2    SAB TAB Ectopic Multiple Live Births   2 0 0 0 2       Home Medications    Prior to Admission medications   Medication Sig Start Date End Date Taking? Authorizing Provider  albuterol (PROVENTIL HFA;VENTOLIN HFA) 108 (90 Base) MCG/ACT inhaler Inhale 1-2 puffs into the lungs every 6 (six) hours as needed for wheezing or shortness of breath. 07/28/15    Tammy Triplett, PA-C  amoxicillin (AMOXIL) 500 MG capsule Take 1 capsule (500 mg total) by mouth 3 (three) times daily. 06/26/16 07/06/16  Burgess AmorJulie Dulcinea Kinser, PA-C  aspirin-acetaminophen-caffeine (EXCEDRIN MIGRAINE) [id number] MG tablet Take 2 tablets by mouth once.    Historical Provider, MD  benzonatate (TESSALON) 200 MG capsule Take 1 capsule (200 mg total) by mouth 3 (three) times daily as needed for cough. 07/28/15   Tammy Triplett, PA-C  levonorgestrel (MIRENA) 20 MCG/24HR IUD 1 each by Intrauterine route once.    Historical Provider, MD  oxyCODONE-acetaminophen (PERCOCET/ROXICET) 5-325 MG tablet Take 1 tablet by mouth every 4 (four) hours as needed. 07/06/15   Dione Boozeavid Glick, MD  predniSONE (DELTASONE) 20 MG tablet Take 2 tablets (40 mg total) by mouth daily. 07/28/15   Tammy Triplett, PA-C    Family History Family History  Problem Relation Age of Onset  . Hypertension Father   . Diabetes Father   . Hypertension Paternal Grandmother   . Diabetes Paternal Grandmother   . Diabetes Paternal Grandfather   . Diabetes Paternal Aunt   . Diabetes Paternal Uncle     Social History Social History  Substance Use Topics  . Smoking status: Former Smoker    Types: Cigarettes    Quit date: 05/21/2007  . Smokeless tobacco: Never Used  .  Alcohol use No     Comment: occ.      Allergies   Shellfish allergy   Review of Systems Review of Systems  Constitutional: Positive for fever. Negative for chills.  HENT: Positive for congestion, ear pain, postnasal drip, rhinorrhea, sore throat and voice change. Negative for sinus pain, sinus pressure and trouble swallowing.   Eyes: Negative for discharge.  Respiratory: Negative for cough, shortness of breath, wheezing and stridor.   Cardiovascular: Negative for chest pain.  Gastrointestinal: Negative for abdominal pain.  Genitourinary: Negative.      Physical Exam Updated Vital Signs BP 137/77 (BP Location: Left Arm)   Pulse 72   Temp 98 F (36.7 C)  (Temporal)   Resp 16   Ht 5\' 7"  (1.702 m)   Wt (!) 172.4 kg   SpO2 100%   BMI 59.52 kg/m   Physical Exam  Constitutional: She is oriented to person, place, and time. She appears well-developed and well-nourished.  HENT:  Head: Normocephalic and atraumatic.  Right Ear: Tympanic membrane and ear canal normal.  Left Ear: Tympanic membrane and ear canal normal.  Nose: Rhinorrhea present. No mucosal edema.  Mouth/Throat: Uvula is midline and mucous membranes are normal. Posterior oropharyngeal erythema present. No oropharyngeal exudate, posterior oropharyngeal edema or tonsillar abscesses.  Eyes: Conjunctivae are normal.  Cardiovascular: Normal rate and normal heart sounds.   Pulmonary/Chest: Effort normal. No respiratory distress. She has no wheezes. She has no rales.  Abdominal: Soft. There is no tenderness.  Musculoskeletal: Normal range of motion.  Neurological: She is alert and oriented to person, place, and time.  Skin: Skin is warm and dry. No rash noted.  Psychiatric: She has a normal mood and affect.     ED Treatments / Results  Labs (all labs ordered are listed, but only abnormal results are displayed) Labs Reviewed  RAPID STREP SCREEN (NOT AT [facility]) - Abnormal; Notable for the following:       Result Value   Streptococcus, Group A Screen (Direct) POSITIVE (*)    All other components within normal limits    EKG  EKG Interpretation None       Radiology No results found.  Procedures Procedures (including critical care time)  Medications Ordered in ED Medications  amoxicillin (AMOXIL) capsule 500 mg (500 mg Oral Given 06/26/16 1632)     Initial Impression / Assessment and Plan / ED Course  I have reviewed the triage vital signs and the nursing notes.  Pertinent labs & imaging results that were available during my care of the patient were reviewed by me and considered in my medical decision making (see chart for details).  Clinical Course     Pt was  given amoxil, advised rest, motrin, increased fluid intake, prn f/u . No peritonsillar abscess.  Lungs ctab.  No respiratory distress.  The patient appears reasonably screened and/or stabilized for discharge and I doubt any other medical condition or other [facility] requiring further screening, evaluation, or treatment in the ED at this time prior to discharge.   Final Clinical Impressions(s) / ED Diagnoses   Final diagnoses:  Strep pharyngitis    New Prescriptions New Prescriptions   AMOXICILLIN (AMOXIL) 500 MG CAPSULE    Take 1 capsule (500 mg total) by mouth 3 (three) times daily.     Burgess AmorJulie Cambrey Lupi, PA-C 06/26/16 1639    Lavera Guiseana Duo Liu, MD 06/27/16 1136

## 2016-08-23 ENCOUNTER — Telehealth: Payer: Self-pay | Admitting: *Deleted

## 2016-08-23 NOTE — Telephone Encounter (Signed)
Pt called with concerns of 3 retained tampons that are now out but is having heavy periods. She has an IUD but can't feel the strings. I advised patient that since she has not had a pap smear since 2013 to schedule a pap/physical and her strings can be checked them. Pt verbalized understanding. Will schedule appointment.

## 2016-09-04 ENCOUNTER — Other Ambulatory Visit: Payer: Medicaid Other | Admitting: Advanced Practice Midwife

## 2016-11-12 ENCOUNTER — Encounter (HOSPITAL_COMMUNITY): Payer: Self-pay | Admitting: Cardiology

## 2016-11-12 ENCOUNTER — Emergency Department (HOSPITAL_COMMUNITY): Payer: Managed Care, Other (non HMO)

## 2016-11-12 ENCOUNTER — Emergency Department (HOSPITAL_COMMUNITY)
Admission: EM | Admit: 2016-11-12 | Discharge: 2016-11-12 | Disposition: A | Discharge status: Home / Self Care | Payer: Managed Care, Other (non HMO) | Attending: Emergency Medicine | Admitting: Emergency Medicine

## 2016-11-12 DIAGNOSIS — S161XXA Strain of muscle, fascia and tendon at neck level, initial encounter: Secondary | ICD-10-CM

## 2016-11-12 DIAGNOSIS — Y999 Unspecified external cause status: Secondary | ICD-10-CM | POA: Diagnosis not present

## 2016-11-12 DIAGNOSIS — S40812A Abrasion of left upper arm, initial encounter: Secondary | ICD-10-CM | POA: Insufficient documentation

## 2016-11-12 DIAGNOSIS — Z79899 Other long term (current) drug therapy: Secondary | ICD-10-CM | POA: Insufficient documentation

## 2016-11-12 DIAGNOSIS — S0990XA Unspecified injury of head, initial encounter: Secondary | ICD-10-CM | POA: Diagnosis present

## 2016-11-12 DIAGNOSIS — Z87891 Personal history of nicotine dependence: Secondary | ICD-10-CM | POA: Diagnosis not present

## 2016-11-12 DIAGNOSIS — Y92410 Unspecified street and highway as the place of occurrence of the external cause: Secondary | ICD-10-CM | POA: Insufficient documentation

## 2016-11-12 DIAGNOSIS — S0083XA Contusion of other part of head, initial encounter: Secondary | ICD-10-CM | POA: Insufficient documentation

## 2016-11-12 DIAGNOSIS — S80211A Abrasion, right knee, initial encounter: Secondary | ICD-10-CM | POA: Insufficient documentation

## 2016-11-12 DIAGNOSIS — Z7982 Long term (current) use of aspirin: Secondary | ICD-10-CM | POA: Insufficient documentation

## 2016-11-12 DIAGNOSIS — Y9389 Activity, other specified: Secondary | ICD-10-CM | POA: Diagnosis not present

## 2016-11-12 LAB — PREGNANCY, URINE: Preg Test, Ur: NEGATIVE

## 2016-11-12 MED ORDER — IBUPROFEN 800 MG PO TABS
800.0000 mg | ORAL_TABLET | Freq: Three times a day (TID) | ORAL | 0 refills | Status: DC | PRN
Start: 2016-11-12 — End: 2018-05-04

## 2016-11-12 MED ORDER — FENTANYL CITRATE (PF) 100 MCG/2ML IJ SOLN
50.0000 ug | Freq: Once | INTRAMUSCULAR | Status: AC
  Administered 2016-11-12: 50 ug via INTRAMUSCULAR
  Filled 2016-11-12: qty 2

## 2016-11-12 MED ORDER — METHOCARBAMOL 500 MG PO TABS: 500.0000 mg | ORAL_TABLET | Freq: Two times a day (BID) | ORAL | 0 refills | Status: DC
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
## 2016-11-12 NOTE — Discharge Instructions (Signed)

## 2017-01-09 ENCOUNTER — Encounter (HOSPITAL_COMMUNITY): Payer: Self-pay

## 2017-01-09 ENCOUNTER — Emergency Department (HOSPITAL_COMMUNITY)
Admission: EM | Admit: 2017-01-09 | Discharge: 2017-01-09 | Disposition: A | Discharge status: Home / Self Care | Payer: Managed Care, Other (non HMO) | Attending: Emergency Medicine | Admitting: Emergency Medicine

## 2017-01-09 DIAGNOSIS — Z87891 Personal history of nicotine dependence: Secondary | ICD-10-CM | POA: Diagnosis not present

## 2017-01-09 DIAGNOSIS — K0889 Other specified disorders of teeth and supporting structures: Secondary | ICD-10-CM | POA: Insufficient documentation

## 2017-01-09 DIAGNOSIS — Z79899 Other long term (current) drug therapy: Secondary | ICD-10-CM | POA: Diagnosis not present

## 2017-01-09 MED ORDER — NAPROXEN 500 MG PO TABS: 500.0000 mg | ORAL_TABLET | Freq: Two times a day (BID) | ORAL | 0 refills | Status: DC

## 2017-01-09 MED ORDER — PENICILLIN V POTASSIUM 500 MG PO TABS: 500.0000 mg | ORAL_TABLET | Freq: Four times a day (QID) | ORAL | 0 refills | Status: AC

## 2017-01-09 NOTE — Discharge Instructions (Signed)
You need to see a dentist for extraction.  Return for any new or worsening symptoms

## 2017-01-09 NOTE — ED Provider Notes (Signed)
**Note [name]-Identified via Obfuscation** [facility]ER[facility]ENCY DEPT Provider Note   CSN: [id number] Arrival date & time: 01/09/17  0008     History   Chief Complaint Chief Complaint  Patient presents with  . Dental Pain    HPI [name] [name] is a 31 y.o. female.  HPI  This is a 31 year old female who presents with dental pain. Patient reports one to 2 day history of worsening pain over tooth #12. Reports a history of the same. She has seen a dentist but reports that it will cost her $500 to have the tooth extracted. Denies fevers. Pain does improve with ibuprofen in BC powder. Current pain is 4 out of 10. Denies any difficulty swallowing or significant swelling.  Past Medical History:  Diagnosis Date  . Abnormal Pap smear   . Anxiety   . Bipolar 1 disorder (HCC)   . Depression   . IUD (intrauterine device) in place 04/12/2013   Had IUD inserted 03/29/13 could not feel strings went to ER 9/18 and KUB saw IUD still could not see string, can't see now but seen in US  . Kidney infection   . Pregnancy   . UTI (urinary tract infection)     Patient Active Problem List   Diagnosis Date Noted  . Persistent headaches 08/03/2013  . Obesity, morbid, BMI 50 or higher (HCC) 08/03/2013  . Snoring 08/03/2013  . IUD (intrauterine device) in place 04/12/2013  . Pregnancy examination or test, negative result 03/31/2013  . Chlamydia 01/18/2013  . DUB (dysfunctional uterine bleeding) 01/13/2013  . Pyelonephritis 07/18/2011  . Obesity, Class III, BMI 40-49.9 (morbid obesity) (HCC) 07/18/2011    Past Surgical History:  Procedure Laterality Date  . CESAREAN SECTION    . TOOTH EXTRACTION      OB History    Gravida Para Term Preterm AB Living   4 2 1 1 2 2    SAB TAB Ectopic Multiple Live Births   2 0 0 0 2       Home Medications    Prior to Admission medications   Medication Sig Start Date End Date Taking? Authorizing Provider  ibuprofen (ADVIL,MOTRIN) 800 MG tablet Take 1 tablet (800 mg total) by mouth every 8 (eight)  hours as needed. 11/12/16  Yes Long, Arlyss RepressJoshua G, MD  albuterol (PROVENTIL HFA;VENTOLIN HFA) 108 (90 Base) MCG/ACT inhaler Inhale 1-2 puffs into the lungs every 6 (six) hours as needed for wheezing or shortness of breath. 07/28/15   Triplett, Tammy, PA-C  aspirin-acetaminophen-caffeine (EXCEDRIN MIGRAINE) [phone] MG tablet Take 2 tablets by mouth once.    [provider]  benzonatate (TESSALON) 200 MG capsule Take 1 capsule (200 mg total) by mouth 3 (three) times daily as needed for cough. 07/28/15   Triplett, Tammy, PA-C  levonorgestrel (MIRENA) 20 MCG/24HR IUD 1 each by Intrauterine route once.    [provider]  methocarbamol (ROBAXIN) 500 MG tablet Take 1 tablet (500 mg total) by mouth 2 (two) times daily. 11/12/16   Long, Arlyss RepressJoshua G, MD  naproxen (NAPROSYN) 500 MG tablet Take 1 tablet (500 mg total) by mouth 2 (two) times daily. 01/09/17   Neeka Urista, Mayer Maskerourtney F, MD  oxyCODONE-acetaminophen (PERCOCET/ROXICET) 5-325 MG tablet Take 1 tablet by mouth every 4 (four) hours as needed. 07/06/15   Dione BoozeGlick, David, MD  penicillin v potassium (VEETID) 500 MG tablet Take 1 tablet (500 mg total) by mouth 4 (four) times daily. 01/09/17 01/16/17  Ireta Pullman, Mayer Maskerourtney F, MD  predniSONE (DELTASONE) 20 MG tablet Take 2 tablets (40 mg total)  by mouth daily. 07/28/15   Pauline Aus, PA-C    Family History Family History  Problem Relation Age of Onset  . Hypertension Father   . Diabetes Father   . Hypertension Paternal Grandmother   . Diabetes Paternal Grandmother   . Diabetes Paternal Grandfather   . Diabetes Paternal Aunt   . Diabetes Paternal Uncle     Social History Social History  Substance Use Topics  . Smoking status: Former Smoker    Types: Cigarettes    Quit date: 05/21/2007  . Smokeless tobacco: Never Used  . Alcohol use No     Comment: occ.      Allergies   Shellfish allergy   Review of Systems Review of Systems  Constitutional: Negative for fever.  HENT: Positive for dental  problem. Negative for trouble swallowing.   All other systems reviewed and are negative.    Physical Exam Updated Vital Signs BP 138/84 (BP Location: Right Arm)   Pulse 84   Temp 98.3 F (36.8 C) (Oral)   Resp 16   Ht 5\' 7"  (1.702 m)   Wt (!) 154.2 kg (340 lb)   SpO2 100%   BMI 53.25 kg/m   Physical Exam  Constitutional: She is oriented to person, place, and time. She appears well-developed and well-nourished.  Obese  HENT:  Head: Normocephalic and atraumatic.  Chronically fractured tooth #12, tenderness palpation along the gumline, no drainable abscess noted, no trismus, no fullness noted under the tongue  Cardiovascular: Normal rate and regular rhythm.   Pulmonary/Chest: Effort normal. No respiratory distress.  Neurological: She is alert and oriented to person, place, and time.  Skin: Skin is warm and dry.  Psychiatric: She has a normal mood and affect.  Nursing note and vitals reviewed.    ED Treatments / Results  Labs (all labs ordered are listed, but only abnormal results are displayed) Labs Reviewed - No data to display  EKG  EKG Interpretation None       Radiology No results found.  Procedures Procedures (including critical care time)  Medications Ordered in ED Medications - No data to display   Initial Impression / Assessment and Plan / ED Course  I have reviewed the triage vital signs and the nursing notes.  Pertinent labs & imaging results that were available during my care of the patient were reviewed by me and considered in my medical decision making (see chart for details).     Patient presents with dental pain. Likely early abscess or infection. Chronically fractured tooth. Difficulty getting in with dentistry secondary to financial issues. Will place on penicillin. Dental resources provided. NSAIDs for pain.  After history, exam, and medical workup I feel the patient has been appropriately medically screened and is safe for discharge  home. Pertinent diagnoses were discussed with the patient. Patient was given return precautions.   Final Clinical Impressions(s) / ED Diagnoses   Final diagnoses:  Pain, dental    New Prescriptions New Prescriptions   NAPROXEN (NAPROSYN) 500 MG TABLET    Take 1 tablet (500 mg total) by mouth 2 (two) times daily.   PENICILLIN V POTASSIUM (VEETID) 500 MG TABLET    Take 1 tablet (500 mg total) by mouth 4 (four) times daily.     Shon Baton, MD 01/09/17 [phone]

## 2017-01-09 NOTE — ED Triage Notes (Signed)
Pain and swelling to left upper teeth x 2 days,

## 2017-11-13 ENCOUNTER — Other Ambulatory Visit: Payer: Self-pay | Admitting: Women's Health

## 2017-12-04 ENCOUNTER — Other Ambulatory Visit: Payer: Self-pay | Admitting: Women's Health

## 2017-12-29 ENCOUNTER — Other Ambulatory Visit: Payer: Self-pay | Admitting: Women's Health

## 2018-03-31 ENCOUNTER — Other Ambulatory Visit: Payer: Medicaid Other | Admitting: Advanced Practice Midwife

## 2018-05-04 ENCOUNTER — Ambulatory Visit (INDEPENDENT_AMBULATORY_CARE_PROVIDER_SITE_OTHER): Payer: Medicaid Other | Admitting: Adult Health

## 2018-05-04 ENCOUNTER — Other Ambulatory Visit (HOSPITAL_COMMUNITY)
Admission: RE | Admit: 2018-05-04 | Discharge: 2018-05-04 | Disposition: A | Discharge status: Home / Self Care | Payer: Medicaid Other | Source: Ambulatory Visit | Attending: Adult Health | Admitting: Adult Health

## 2018-05-04 ENCOUNTER — Encounter: Payer: Self-pay | Admitting: Adult Health

## 2018-05-04 VITALS — BP 124/75 | HR 88 | Ht 64.0 in | Wt 337.5 lb

## 2018-05-04 DIAGNOSIS — Z309 Encounter for contraceptive management, unspecified: Secondary | ICD-10-CM | POA: Diagnosis not present

## 2018-05-04 DIAGNOSIS — T8332XS Displacement of intrauterine contraceptive device, sequela: Secondary | ICD-10-CM

## 2018-05-04 DIAGNOSIS — Z113 Encounter for screening for infections with a predominantly sexual mode of transmission: Secondary | ICD-10-CM

## 2018-05-04 DIAGNOSIS — Z3009 Encounter for other general counseling and advice on contraception: Secondary | ICD-10-CM | POA: Diagnosis present

## 2018-05-04 DIAGNOSIS — N920 Excessive and frequent menstruation with regular cycle: Secondary | ICD-10-CM

## 2018-05-04 DIAGNOSIS — Z01419 Encounter for gynecological examination (general) (routine) without abnormal findings: Secondary | ICD-10-CM | POA: Insufficient documentation

## 2018-05-04 DIAGNOSIS — T8332XA Displacement of intrauterine contraceptive device, initial encounter: Secondary | ICD-10-CM | POA: Insufficient documentation

## 2018-05-04 DIAGNOSIS — R102 Pelvic and perineal pain: Secondary | ICD-10-CM

## 2018-05-04 NOTE — Progress Notes (Signed)
Patient ID: [name] [name], female   DOB: [birth year], 32 y.o.   MRN: [id number] History of Present Illness: [name] [name] is a 32 year old black female, married(he is in jail for last 2 years), in for well woman gyn exam and pap.She has IUD since 03/31/13 but is having heavy periods for last year.She is HHA with [place]. Last pap 11/28/11. NO PCP.   Current Medications, Allergies, Past Medical History, Past Surgical History, Family History and Social History were reviewed in [employer] record.     Review of Systems:  Patient denies any headaches, hearing loss, fatigue, blurred vision, shortness of breath, chest pain, abdominal pain, problems with bowel movements, urination, or intercourse(no sex in 1.5 years). No joint pain or mood swings. +heavy periods for last year or so, and changes every 3-4 years, wears pads because tampons hurt.  Physical Exam:BP 124/75 (BP Location: Left Arm, Patient Position: Sitting, Cuff Size: Normal)   Pulse 88   Ht 5\' 4"  (1.626 m)   Wt (!) 337 lb 8 oz (153.1 kg)   LMP 04/23/2018   BMI 57.93 kg/m  General:  Well developed, well nourished, no acute distress Skin:  Warm and dry Neck:  Midline trachea, normal thyroid, good ROM, no lymphadenopathy Lungs; Clear to auscultation bilaterally Breast:  No dominant palpable mass, retraction, or nipple discharge,large 48 I, and has shoulder indents. Cardiovascular: Regular rate and rhythm Abdomen:  Soft, non tender, no hepatosplenomegaly Pelvic:  External genitalia is normal in appearance, no lesions.  The vagina is normal in appearance. Urethra has no lesions or masses. The cervix is bulbous. No IUD strings seen, pap with GC/CHL and HPV performed. Uterus is felt to be normal size, shape, and contour.  No adnexal masses or tenderness noted.Bladder is non tender, no masses felt. Extremities/musculoskeletal:  No swelling or varicosities noted, no clubbing or cyanosis Psych:  No mood changes, alert and  cooperative,seems happy PHQ 2 score 0. Examination chaperoned by Malachy Mood LPN. She may want IUD removed and reinserted, but will get [place] first to assess tenderness and bleeding, and IUD placement.  Impression: 1. Encounter for gynecological examination with Papanicolaou smear of cervix   2. Family planning   3. Screening examination for STD (sexually transmitted disease)   4. Intrauterine contraceptive device threads lost, sequela   5. Menorrhagia with regular cycle   6. Tenderness of female pelvic organs       Plan: Pap with HPV and GC/CHL sent  Check HIV and RPR Return in 1 week for GYN [place] to assess tenderness and bleeding and check for IUD Physical in 1 year Pap in 3 if normal

## 2018-05-05 ENCOUNTER — Other Ambulatory Visit: Payer: Self-pay | Admitting: *Deleted

## 2018-05-05 DIAGNOSIS — T8332XS Displacement of intrauterine contraceptive device, sequela: Secondary | ICD-10-CM

## 2018-05-05 DIAGNOSIS — R102 Pelvic and perineal pain: Secondary | ICD-10-CM

## 2018-05-05 DIAGNOSIS — N920 Excessive and frequent menstruation with regular cycle: Secondary | ICD-10-CM

## 2018-05-05 LAB — HIV ANTIBODY (ROUTINE TESTING W REFLEX): HIV Screen 4th Generation wRfx: NONREACTIVE

## 2018-05-05 LAB — RPR: RPR Ser Ql: NONREACTIVE

## 2018-05-06 ENCOUNTER — Other Ambulatory Visit: Payer: Medicaid Other

## 2018-05-08 ENCOUNTER — Ambulatory Visit (HOSPITAL_COMMUNITY): Payer: Managed Care, Other (non HMO)

## 2018-05-08 LAB — CYTOLOGY - PAP
CHLAMYDIA, DNA PROBE: NEGATIVE
Diagnosis: NEGATIVE
HPV (WINDOPATH): NOT DETECTED
NEISSERIA GONORRHEA: NEGATIVE

## 2018-05-14 ENCOUNTER — Ambulatory Visit: Payer: Medicaid Other | Admitting: Advanced Practice Midwife

## 2018-07-27 ENCOUNTER — Ambulatory Visit (HOSPITAL_COMMUNITY)
Admission: RE | Admit: 2018-07-27 | Discharge: 2018-07-27 | Disposition: A | Discharge status: Home / Self Care | Payer: Medicaid Other | Source: Ambulatory Visit | Attending: Adult Health | Admitting: Adult Health

## 2018-07-27 ENCOUNTER — Telehealth: Payer: Self-pay | Admitting: Adult Health

## 2018-07-27 DIAGNOSIS — T8332XS Displacement of intrauterine contraceptive device, sequela: Secondary | ICD-10-CM | POA: Insufficient documentation

## 2018-07-27 DIAGNOSIS — R102 Pelvic and perineal pain: Secondary | ICD-10-CM | POA: Insufficient documentation

## 2018-07-27 DIAGNOSIS — N920 Excessive and frequent menstruation with regular cycle: Secondary | ICD-10-CM | POA: Insufficient documentation

## 2018-07-27 NOTE — Telephone Encounter (Signed)
Pt aware that US showed IUD to be malpositioned, she wants removed and replaced, if has sex use condom, call with next period for appt to have removed and replaced, with Dr Emelda Fear and use [place] at bedside

## 2018-07-28 ENCOUNTER — Telehealth: Payer: Self-pay | Admitting: Adult Health

## 2018-07-28 NOTE — Telephone Encounter (Signed)
Patient called stating that she has a question regarding having her tubes tide. Pt states that she would like a call back from [place]. Please contact pt

## 2018-07-28 NOTE — Telephone Encounter (Signed)
Returned patients call. She was wanting to know if she had to be out of work for a long time after BTL, I advised that most people are only out for a few days and then able to return to normal activity. She also asked if she wanted to have another baby later should she have tubal, I advised that she should consider BTL permanent and choose other method of contraception. Patient stated that she will likely get IUD. Advised her to call and set up appt if this is what she wants to do. No further questions at this time.

## 2018-08-26 ENCOUNTER — Encounter (HOSPITAL_COMMUNITY): Payer: Self-pay

## 2018-08-26 ENCOUNTER — Other Ambulatory Visit: Payer: Self-pay

## 2018-08-26 ENCOUNTER — Emergency Department (HOSPITAL_COMMUNITY)
Admission: EM | Admit: 2018-08-26 | Discharge: 2018-08-26 | Disposition: A | Discharge status: Home / Self Care | Payer: Medicaid Other | Attending: Emergency Medicine | Admitting: Emergency Medicine

## 2018-08-26 DIAGNOSIS — Z79899 Other long term (current) drug therapy: Secondary | ICD-10-CM | POA: Insufficient documentation

## 2018-08-26 DIAGNOSIS — B9789 Other viral agents as the cause of diseases classified elsewhere: Secondary | ICD-10-CM

## 2018-08-26 DIAGNOSIS — Z87891 Personal history of nicotine dependence: Secondary | ICD-10-CM | POA: Insufficient documentation

## 2018-08-26 DIAGNOSIS — J069 Acute upper respiratory infection, unspecified: Secondary | ICD-10-CM | POA: Insufficient documentation

## 2018-08-26 NOTE — ED Provider Notes (Signed)
[facility] EMERGENCY DEPARTMENT Provider Note   CSN: [id number] Arrival date & time: 08/26/18  3833     History   Chief Complaint Chief Complaint  Patient presents with  . Cough    HPI [name] [name] is a 33 y.o. female.  The history is provided by the patient. No language interpreter was used.  Cough  Cough characteristics:  Non-productive Sputum characteristics:  Nondescript Severity:  Moderate Onset quality:  Gradual Smoker: no   Relieved by:  Nothing Worsened by:  Nothing Ineffective treatments:  None tried Associated symptoms: fever     Past Medical History:  Diagnosis Date  . Abnormal Pap smear   . Anxiety   . Bipolar 1 disorder (HCC)   . Depression   . IUD (intrauterine device) in place 04/12/2013   Had IUD inserted 03/29/13 could not feel strings went to ER 9/18 and KUB saw IUD still could not see string, can't see now but seen in [place]  . Kidney infection   . Pregnancy   . UTI (urinary tract infection)     Patient Active Problem List   Diagnosis Date Noted  . Encounter for gynecological examination with Papanicolaou smear of cervix 05/04/2018  . Family planning 05/04/2018  . Screening examination for STD (sexually transmitted disease) 05/04/2018  . IUD threads lost 05/04/2018  . Menorrhagia with regular cycle 05/04/2018  . Tenderness of female pelvic organs 05/04/2018  . Persistent headaches 08/03/2013  . Obesity, morbid, BMI 50 or higher (HCC) 08/03/2013  . Snoring 08/03/2013  . IUD (intrauterine device) in place 04/12/2013  . Pregnancy examination or test, negative result 03/31/2013  . Chlamydia 01/18/2013  . DUB (dysfunctional uterine bleeding) 01/13/2013  . Pyelonephritis 07/18/2011  . Obesity, Class III, BMI 40-49.9 (morbid obesity) (HCC) 07/18/2011    Past Surgical History:  Procedure Laterality Date  . CESAREAN SECTION    . TOOTH EXTRACTION       OB History    Gravida  4   Para  2   Term  1   Preterm  1   AB  2   Living  2       SAB  2   TAB  0   Ectopic  0   Multiple  0   Live Births  2            Home Medications    Prior to Admission medications   Medication Sig Start Date End Date Taking? Authorizing Provider  aspirin-acetaminophen-caffeine (EXCEDRIN MIGRAINE) [phone] MG tablet Take 2 tablets by mouth once.    [provider]  levonorgestrel (MIRENA) 20 MCG/24HR IUD 1 each by Intrauterine route once.    [provider]    Family History Family History  Problem Relation Age of Onset  . Hypertension Father   . Diabetes Father   . Hypertension Paternal Grandmother   . Diabetes Paternal Grandmother   . Diabetes Paternal Grandfather   . Diabetes Paternal Aunt   . Diabetes Paternal Uncle     Social History Social History   Tobacco Use  . Smoking status: Former Smoker    Types: Cigarettes    Last attempt to quit: 05/21/2007    Years since quitting: 11.2  . Smokeless tobacco: Never Used  Substance Use Topics  . Alcohol use: No    Comment: occ.   . Drug use: No     Allergies   Shellfish allergy   Review of Systems Review of Systems  Constitutional: Positive for fever.  Respiratory: Positive for cough.   All other systems reviewed and are negative.    Physical Exam Updated Vital Signs BP 133/90 (BP Location: Right Arm)   Temp 98.9 F (37.2 C)   Resp 20   Ht 5\' 9"  (1.753 m)   Wt (!) 154.2 kg   SpO2 97%   BMI 50.21 kg/m   Physical Exam Vitals signs and nursing note reviewed.  Constitutional:      Appearance: She is well-developed.  HENT:     Head: Normocephalic.     Right Ear: Tympanic membrane normal.     Left Ear: Tympanic membrane normal.     Nose: Nose normal.     Mouth/Throat:     Mouth: Mucous membranes are moist.  Eyes:     Pupils: Pupils are equal, round, and reactive to light.  Neck:     Musculoskeletal: Normal range of motion.  Cardiovascular:     Rate and Rhythm: Normal rate.     Pulses: Normal pulses.  Pulmonary:      Effort: Pulmonary effort is normal.  Abdominal:     General: Abdomen is flat. There is no distension.  Musculoskeletal: Normal range of motion.  Neurological:     General: No focal deficit present.     Mental Status: She is alert and oriented to person, place, and time.  Psychiatric:        Mood and Affect: Mood normal.      ED Treatments / Results  Labs (all labs ordered are listed, but only abnormal results are displayed) Labs Reviewed - No data to display  EKG None  Radiology No results found.  Procedures Procedures (including critical care time)  Medications Ordered in ED Medications - No data to display   Initial Impression / Assessment and Plan / ED Course  I have reviewed the triage vital signs and the nursing notes.  Pertinent labs & imaging results that were available during my care of the patient were reviewed by me and considered in my medical decision making (see chart for details).     MDM  Viral illness.  Pt counseled on ibuprofen.  Final Clinical Impressions(s) / ED Diagnoses   Final diagnoses:  Viral URI with cough    ED Discharge Orders    None    An After Visit Summary was printed and given to the patient.    Elson AreasSofia, Biridiana Twardowski K, PA-C 08/26/18 1554    Samuel JesterMcManus, Kathleen, DO 08/27/18 [phone]

## 2018-08-26 NOTE — Discharge Instructions (Signed)
Return if any problems.

## 2018-08-26 NOTE — ED Triage Notes (Signed)
Pt reports cough and cold symptoms since Sunday.  Reports vomited Monday, none since then.

## 2018-08-27 ENCOUNTER — Ambulatory Visit: Payer: Medicaid Other | Admitting: Obstetrics and Gynecology

## 2018-09-01 ENCOUNTER — Encounter: Payer: Self-pay | Admitting: Women's Health

## 2018-09-01 ENCOUNTER — Other Ambulatory Visit: Payer: Self-pay

## 2018-09-01 ENCOUNTER — Ambulatory Visit (INDEPENDENT_AMBULATORY_CARE_PROVIDER_SITE_OTHER): Payer: Medicaid Other | Admitting: Women's Health

## 2018-09-01 VITALS — BP 108/66 | HR 86 | Ht 66.0 in | Wt 342.0 lb

## 2018-09-01 DIAGNOSIS — Z30430 Encounter for insertion of intrauterine contraceptive device: Secondary | ICD-10-CM | POA: Insufficient documentation

## 2018-09-01 DIAGNOSIS — Z3202 Encounter for pregnancy test, result negative: Secondary | ICD-10-CM | POA: Diagnosis not present

## 2018-09-01 DIAGNOSIS — Z30433 Encounter for removal and reinsertion of intrauterine contraceptive device: Secondary | ICD-10-CM

## 2018-09-01 DIAGNOSIS — Z30432 Encounter for removal of intrauterine contraceptive device: Secondary | ICD-10-CM

## 2018-09-01 LAB — POCT URINE PREGNANCY: Preg Test, Ur: NEGATIVE

## 2018-09-01 MED ORDER — LEVONORGESTREL 19.5 MCG/DAY IU IUD
INTRAUTERINE_SYSTEM | Freq: Once | INTRAUTERINE | Status: AC
  Administered 2018-09-01: 10:00:00 via INTRAUTERINE

## 2018-09-01 NOTE — Patient Instructions (Signed)
 Nothing in vagina for 3 days (no sex, douching, tampons, etc...)  Check your strings once a month to make sure you can feel them, if you are not able to please let us know  If you develop a fever of 100.4 or more in the next few weeks, or if you develop severe abdominal pain, please let us know  Use a backup method of birth control, such as condoms, for 2 weeks    Intrauterine Device Insertion, Care After  This sheet gives you information about how to care for yourself after your procedure. Your health care provider may also give you more specific instructions. If you have problems or questions, contact your health care provider. What can I expect after the procedure? After the procedure, it is common to have:  Cramps and pain in the abdomen.  Light bleeding (spotting) or heavier bleeding that is like your menstrual period. This may last for up to a few days.  Lower back pain.  Dizziness.  Headaches.  Nausea. Follow these instructions at home:  Before resuming sexual activity, check to make sure that you can feel the IUD string(s). You should be able to feel the end of the string(s) below the opening of your cervix. If your IUD string is in place, you may resume sexual activity. ? If you had a hormonal IUD inserted more than 7 days after your most recent period started, you will need to use a backup method of birth control for 7 days after IUD insertion. Ask your health care provider whether this applies to you.  Continue to check that the IUD is still in place by feeling for the string(s) after every menstrual period, or once a month.  Take over-the-counter and prescription medicines only as told by your health care provider.  Do not drive or use heavy machinery while taking prescription pain medicine.  Keep all follow-up visits as told by your health care provider. This is important. Contact a health care provider if:  You have bleeding that is heavier or lasts longer than  a normal menstrual cycle.  You have a fever.  You have cramps or abdominal pain that get worse or do not get better with medicine.  You develop abdominal pain that is new or is not in the same area of earlier cramping and pain.  You feel lightheaded or weak.  You have abnormal or bad-smelling discharge from your vagina.  You have pain during sexual activity.  You have any of the following problems with your IUD string(s): ? The string bothers or hurts you or your sexual partner. ? You cannot feel the string. ? The string has gotten longer.  You can feel the IUD in your vagina.  You think you may be pregnant, or you miss your menstrual period.  You think you may have an STI (sexually transmitted infection). Get help right away if:  You have flu-like symptoms.  You have a fever and chills.  You can feel that your IUD has slipped out of place. Summary  After the procedure, it is common to have cramps and pain in the abdomen. It is also common to have light bleeding (spotting) or heavier bleeding that is like your menstrual period.  Continue to check that the IUD is still in place by feeling for the string(s) after every menstrual period, or once a month.  Keep all follow-up visits as told by your health care provider. This is important.  Contact your health care provider if   you have problems with your IUD string(s), such as the string getting longer or bothering you or your sexual partner. This information is not intended to replace advice given to you by your health care provider. Make sure you discuss any questions you have with your health care provider. Document Released: 03/06/2011 Document Revised: 05/29/2016 Document Reviewed: 05/29/2016 Elsevier Interactive Patient Education  2019 Elsevier Inc.  Levonorgestrel intrauterine device (IUD) What is this medicine? LEVONORGESTREL IUD (LEE voe nor jes trel) is a contraceptive (birth control) device. The device is placed  inside the uterus by a healthcare professional. It is used to prevent pregnancy. This device can also be used to treat heavy bleeding that occurs during your period. This medicine may be used for other purposes; ask your health care provider or pharmacist if you have questions. COMMON BRAND NAME(S): Kyleena, LILETTA, Mirena, Skyla What should I tell my health care provider before I take this medicine? They need to know if you have any of these conditions: -abnormal Pap smear -cancer of the breast, uterus, or cervix -diabetes -endometritis -genital or pelvic infection now or in the past -have more than one sexual partner or your partner has more than one partner -heart disease -history of an ectopic or tubal pregnancy -immune system problems -IUD in place -liver disease or tumor -problems with blood clots or take blood-thinners -seizures -use intravenous drugs -uterus of unusual shape -vaginal bleeding that has not been explained -an unusual or allergic reaction to levonorgestrel, other hormones, silicone, or polyethylene, medicines, foods, dyes, or preservatives -pregnant or trying to get pregnant -breast-feeding How should I use this medicine? This device is placed inside the uterus by a health care professional. Talk to your pediatrician regarding the use of this medicine in children. Special care may be needed. Overdosage: If you think you have taken too much of this medicine contact a poison control center or emergency room at once. NOTE: This medicine is only for you. Do not share this medicine with others. What if I miss a dose? This does not apply. Depending on the brand of device you have inserted, the device will need to be replaced every 3 to 5 years if you wish to continue using this type of birth control. What may interact with this medicine? Do not take this medicine with any of the following medications: -amprenavir -bosentan -fosamprenavir This medicine may also  interact with the following medications: -aprepitant -armodafinil -barbiturate medicines for inducing sleep or treating seizures -bexarotene -boceprevir -griseofulvin -medicines to treat seizures like carbamazepine, ethotoin, felbamate, oxcarbazepine, phenytoin, topiramate -modafinil -pioglitazone -rifabutin -rifampin -rifapentine -some medicines to treat HIV infection like atazanavir, efavirenz, indinavir, lopinavir, nelfinavir, tipranavir, ritonavir -St. John's wort -warfarin This list may not describe all possible interactions. Give your health care provider a list of all the medicines, herbs, non-prescription drugs, or dietary supplements you use. Also tell them if you smoke, drink alcohol, or use illegal drugs. Some items may interact with your medicine. What should I watch for while using this medicine? Visit your doctor or health care professional for regular check ups. See your doctor if you or your partner has sexual contact with others, becomes HIV positive, or gets a sexual transmitted disease. This product does not protect you against HIV infection (AIDS) or other sexually transmitted diseases. You can check the placement of the IUD yourself by reaching up to the top of your vagina with clean fingers to feel the threads. Do not pull on the threads. It is a good habit   to check placement after each menstrual period. Call your doctor right away if you feel more of the IUD than just the threads or if you cannot feel the threads at all. The IUD may come out by itself. You may become pregnant if the device comes out. If you notice that the IUD has come out use a backup birth control method like condoms and call your health care provider. Using tampons will not change the position of the IUD and are okay to use during your period. This IUD can be safely scanned with magnetic resonance imaging (MRI) only under specific conditions. Before you have an MRI, tell your healthcare provider that  you have an IUD in place, and which type of IUD you have in place. What side effects may I notice from receiving this medicine? Side effects that you should report to your doctor or health care professional as soon as possible: -allergic reactions like skin rash, itching or hives, swelling of the face, lips, or tongue -fever, flu-like symptoms -genital sores -high blood pressure -no menstrual period for 6 weeks during use -pain, swelling, warmth in the leg -pelvic pain or tenderness -severe or sudden headache -signs of pregnancy -stomach cramping -sudden shortness of breath -trouble with balance, talking, or walking -unusual vaginal bleeding, discharge -yellowing of the eyes or skin Side effects that usually do not require medical attention (report to your doctor or health care professional if they continue or are bothersome): -acne -breast pain -change in sex drive or performance -changes in weight -cramping, dizziness, or faintness while the device is being inserted -headache -irregular menstrual bleeding within first 3 to 6 months of use -nausea This list may not describe all possible side effects. Call your doctor for medical advice about side effects. You may report side effects to FDA at 1-800-FDA-1088. Where should I keep my medicine? This does not apply. NOTE: This sheet is a summary. It may not cover all possible information. If you have questions about this medicine, talk to your doctor, pharmacist, or health care provider.  2019 Elsevier/Gold Standard (2016-04-19 14:14:56)  

## 2018-09-01 NOTE — Progress Notes (Signed)
   IUD REMOVAL & RE-INSERTION Patient name: [name] [name] MRN [id number]  Date of birth: @DOB  Subjective Findings:   @[place]  [name] [name] is a 33 y.o. [id number] African American female being seen today for removal of a Mirena IUD and insertion of a Liletta IUD. Her IUD was placed 2014.   Patient's last menstrual period was 08/30/2018. Last sexual intercourse was >2wks ago Last pap10/14/19. Results were:  neg w/ -HRHPV  The risks and benefits of the method and placement have been thouroughly reviewed with the patient and all questions were answered.  Specifically the patient is aware of failure rate of 07/998, expulsion of the IUD and of possible perforation.  The patient is aware of irregular bleeding due to the method and understands the incidence of irregular bleeding diminishes with time.  Signed copy of informed consent in chart.  Pertinent History Reviewed:   Reviewed past medical,surgical, social, obstetrical and family history.  Reviewed problem list, medications and allergies. Objective Findings & Procedure:    Vitals:   09/01/18 0839  BP: 108/66  Pulse: 86  Weight: (!) 342 lb (155.1 kg)  Height: 5\' 6"  (1.676 m)  Body mass index is 55.2 kg/m.  Results for orders placed or performed in visit on 09/01/18 (from the past 24 hour(s))  POCT urine pregnancy   Collection Time: 09/01/18  8:44 AM  Result Value Ref Range   Preg Test, Ur Negative Negative     Time out was performed.  A graves speculum was placed in the vagina.  The cervix was visualized, large fleshy growth entire Lt side of cx, same color as cx, co-exam w/ LHE- probable hypertrophy from cervical lac at vag delivery, prepped using Betadine. The strings were visible. They were grasped and the Mirena IUD was easily removed. The cervix was then grasped with a single-tooth tenaculum. The uterus was found to be neutral and it sounded to 10 cm.  Liletta IUD placed per manufacturer's recommendations without complications. The  strings were trimmed to approximately 3 cm.  The patient tolerated the procedure well.   Informal transvaginal sonogram was performed and the proper placement of the IUD was verified. Incidental finding of 3cm ovarian cyst, pt reports no pain/problems Assessment & Plan:   1) Mirena IUD removal & Liletta insertion The patient was given post procedure instructions, including signs and symptoms of infection and to check for the strings after each menses or each month, and refraining from intercourse or anything in the vagina for 3 days. She was given a Liletta care card with date IUD placed, and date IUD to be removed. She is scheduled for a f/u appointment in 4 weeks. Discussed formal u/s to assess ovarian cyst, has FP Mcaid at this time, so won't pay for it, has not been causing any pain/problems, pt wants to wait.   Orders Placed This Encounter  Procedures  . POCT urine pregnancy    Follow-up: Return in about 4 weeks (around 09/29/2018) for F/U.  Cheral Marker CNM, [facility] 09/01/2018 9:40 AM

## 2018-09-29 ENCOUNTER — Encounter: Payer: Self-pay | Admitting: Women's Health

## 2018-09-29 ENCOUNTER — Ambulatory Visit (INDEPENDENT_AMBULATORY_CARE_PROVIDER_SITE_OTHER): Payer: PRIVATE HEALTH INSURANCE | Admitting: Women's Health

## 2018-09-29 VITALS — BP 127/91 | HR 97 | Ht 66.0 in | Wt 355.0 lb

## 2018-09-29 DIAGNOSIS — Z30431 Encounter for routine checking of intrauterine contraceptive device: Secondary | ICD-10-CM | POA: Diagnosis not present

## 2018-09-29 DIAGNOSIS — Z6841 Body Mass Index (BMI) 40.0 and over, adult: Secondary | ICD-10-CM

## 2018-09-29 DIAGNOSIS — N83209 Unspecified ovarian cyst, unspecified side: Secondary | ICD-10-CM

## 2018-09-29 NOTE — Progress Notes (Signed)
   GYN VISIT Patient name: [name] [name] MRN [id number]  Date of birth: [birth year] Chief Complaint:   Follow-up  History of Present Illness:   [name] [name] is a 33 y.o. [id number] African American female being seen today for IUD check.  Had Mirena removed and Liletta inserted 09/01/18, at that time an app 3cm ovarian cyst was incidentally noted on u/s to confirm IUD placement- she only had FP Mcaid at the time and didn't want to do u/s, as it was not bothering her. However, she states she has begun having intermittent Rt pelvic pain and now has insurance and wants u/s. No bleeding, able to feel strings. Feels hunger has increased, has gained 13lbs in 4wks! States she got her taxes back and has been eating out a lot. Interested in weight loss meds.   Patient's last menstrual period was 08/30/2018. The current method of family planning is IUD. Last pap 05/04/18. Results were:  normal Review of Systems:   Pertinent items are noted in HPI Denies fever/chills, dizziness, headaches, visual disturbances, fatigue, shortness of breath, chest pain, abdominal pain, vomiting, abnormal vaginal discharge/itching/odor/irritation, problems with periods, bowel movements, urination, or intercourse unless otherwise stated above.  Pertinent History Reviewed:  Reviewed past medical,surgical, social, obstetrical and family history.  Reviewed problem list, medications and allergies. Physical Assessment:   Vitals:   09/29/18 0834  BP: (!) 127/91  Pulse: 97  Weight: (!) 355 lb (161 kg)  Height: 5\' 6"  (1.676 m)  Body mass index is 57.3 kg/m.       Physical Examination:   General appearance: alert, well appearing, and in no distress  Mental status: alert, oriented to person, place, and time  Skin: warm & dry   Cardiovascular: normal heart rate noted  Respiratory: normal respiratory effort, no distress  Abdomen: soft, non-tender   Pelvic: VULVA: normal appearing vulva with no masses, tenderness or lesions,  VAGINA: normal appearing vagina with normal color and discharge, no lesions, CERVIX: normal appearing cervix- w/ area of hypertrophy previously noted, without discharge or lesions, IUD strings tucked behind cx at 5 o'clock  Extremities: no edema   No results found for this or any previous visit (from the past 24 hour(s)).  Assessment & Plan:  1) IUD check> in place  2) Obesity BMI 57> interested in weight loss meds  3) Ovarian cyst> wants formal u/s now that she has insurance  Meds: No orders of the defined types were placed in this encounter.   Orders Placed This Encounter  Procedures  . US PELVIS (TRANSABDOMINAL ONLY)  . US PELVIS TRANSVANGINAL NON-OB (TV ONLY)    Return for 1st available pelvic u/s and f/u w/ Jenn after.to discuss weight loss meds  Cheral Marker CNM, [facility] 09/29/2018 9:14 AM

## 2018-10-12 ENCOUNTER — Other Ambulatory Visit: Payer: PRIVATE HEALTH INSURANCE

## 2018-10-12 ENCOUNTER — Ambulatory Visit: Payer: PRIVATE HEALTH INSURANCE | Admitting: Adult Health

## 2018-10-14 NOTE — Progress Notes (Deleted)
Psychiatric Initial Adult Assessment   Patient Identification: [name] [name] MRN:  [id number] Date of Evaluation:  10/14/2018 Referral Source: *** Chief Complaint:   Visit Diagnosis: No diagnosis found.  History of Present Illness:   [name] [name] is a 33 y.o. year old female with a history of bipolar disorder by history, who is referred for bipolar disorder.     Associated Signs/Symptoms: Depression Symptoms:  {DEPRESSION SYMPTOMS:20000} (Hypo) Manic Symptoms:  {BHH MANIC SYMPTOMS:22872} Anxiety Symptoms:  {BHH ANXIETY SYMPTOMS:22873} Psychotic Symptoms:  {BHH PSYCHOTIC SYMPTOMS:22874} PTSD Symptoms: {BHH PTSD SYMPTOMS:22875}  Past Psychiatric History:  Outpatient:  Psychiatry admission:  Previous suicide attempt:  Past trials of medication:  History of violence:   Previous Psychotropic Medications: {YES/NO:21197}  Substance Abuse History in the last 12 months:  {yes no:314532}  Consequences of Substance Abuse: {BHH CONSEQUENCES OF SUBSTANCE ABUSE:22880}  Past Medical History:  Past Medical History:  Diagnosis Date  . Abnormal Pap smear   . Anxiety   . Bipolar 1 disorder (HCC)   . Depression   . IUD (intrauterine device) in place 04/12/2013   Had IUD inserted 03/29/13 could not feel strings went to ER 9/18 and KUB saw IUD still could not see string, can't see now but seen in [place]  . Kidney infection   . Pregnancy   . UTI (urinary tract infection)     Past Surgical History:  Procedure Laterality Date  . CESAREAN SECTION    . TOOTH EXTRACTION      Family Psychiatric History: ***  Family History:  Family History  Problem Relation Age of Onset  . Hypertension Father   . Diabetes Father   . Hypertension Paternal Grandmother   . Diabetes Paternal Grandmother   . Diabetes Paternal Grandfather   . Diabetes Paternal Aunt   . Diabetes Paternal Uncle     Social History:   Social History   Socioeconomic History  . Marital status: Married    Spouse name:  Not on file  . Number of children: Not on file  . Years of education: Not on file  . Highest education level: Not on file  Occupational History  . Not on file  Social Needs  . Financial resource strain: Not on file  . Food insecurity:    Worry: Not on file    Inability: Not on file  . Transportation needs:    Medical: Not on file    Non-medical: Not on file  Tobacco Use  . Smoking status: Former Smoker    Types: Cigarettes    Last attempt to quit: 05/21/2007    Years since quitting: 11.4  . Smokeless tobacco: Never Used  Substance and Sexual Activity  . Alcohol use: No    Comment: occ.   . Drug use: No  . Sexual activity: Yes    Partners: Male    Birth control/protection: I.U.D.  Lifestyle  . Physical activity:    Days per week: Not on file    Minutes per session: Not on file  . Stress: Not on file  Relationships  . Social connections:    Talks on phone: Not on file    Gets together: Not on file    Attends religious service: Not on file    Active member of club or organization: Not on file    Attends meetings of clubs or organizations: Not on file    Relationship status: Not on file  Other Topics Concern  . Not on file  Social History Narrative  .  Not on file    Additional Social History: ***  Allergies:   Allergies  Allergen Reactions  . Shellfish Allergy Anaphylaxis    Throat swelling    Metabolic Disorder Labs: Lab Results  Component Value Date   HGBA1C 5.7 (H) 05/12/2013   MPG 117 (H) 05/12/2013   No results found for: PROLACTIN No results found for: CHOL, TRIG, HDL, CHOLHDL, VLDL, LDLCALC Lab Results  Component Value Date   TSH 1.425 05/12/2013    Therapeutic Level Labs: No results found for: LITHIUM No results found for: CBMZ No results found for: VALPROATE  Current Medications: Current Outpatient Medications  Medication Sig Dispense Refill  . aspirin-acetaminophen-caffeine (EXCEDRIN MIGRAINE) 250-250-65 MG tablet Take 2 tablets by  mouth once.    Marland Kitchen levonorgestrel (MIRENA) 20 MCG/24HR IUD 1 each by Intrauterine route once.     No current facility-administered medications for this visit.     Musculoskeletal: Strength & Muscle Tone: within normal limits Gait & Station: normal Patient leans: N/A  Psychiatric Specialty Exam: ROS  There were no vitals taken for this visit.There is no height or weight on file to calculate BMI.  General Appearance: Fairly Groomed  Eye Contact:  Good  Speech:  Clear and Coherent  Volume:  Normal  Mood:  {BHH MOOD:22306}  Affect:  {Affect (PAA):22687}  Thought Process:  Coherent  Orientation:  Full (Time, Place, and Person)  Thought Content:  Logical  Suicidal Thoughts:  {ST/HT (PAA):22692}  Homicidal Thoughts:  {ST/HT (PAA):22692}  Memory:  Immediate;   Good  Judgement:  {Judgement (PAA):22694}  Insight:  {Insight (PAA):22695}  Psychomotor Activity:  Normal  Concentration:  Concentration: Good and Attention Span: Good  Recall:  Good  Fund of Knowledge:Good  Language: Good  Akathisia:  No  Handed:  Right  AIMS (if indicated):  not done  Assets:  Communication Skills Desire for Improvement  ADL's:  Intact  Cognition: WNL  Sleep:  {BHH GOOD/FAIR/POOR:22877}   Screenings: PHQ2-9     Office Visit from 05/04/2018 in [facility]  PHQ-2 Total Score  0      Assessment and Plan:  Assessment  Plan  The patient demonstrates the following risk factors for suicide: Chronic risk factors for suicide include: {Chronic Risk Factors for BCWUGQB:16945038}. Acute risk factors for suicide include: {Acute Risk Factors for UEKCMKL:49179150}. Protective factors for this patient include: {Protective Factors for Suicide VWPV:94801655}. Considering these factors, the overall suicide risk at this point appears to be {Desc; low/moderate/high:110033}. Patient {ACTION; IS/IS VZS:82707867} appropriate for outpatient follow up.      Neysa Hotter, MD 3/25/20201:19 PM

## 2018-10-19 ENCOUNTER — Encounter: Payer: Self-pay | Admitting: Cardiovascular Disease

## 2018-10-20 ENCOUNTER — Ambulatory Visit (HOSPITAL_COMMUNITY): Payer: Medicaid Other | Admitting: Psychiatry

## 2018-10-20 ENCOUNTER — Other Ambulatory Visit: Payer: Self-pay

## 2018-10-26 ENCOUNTER — Other Ambulatory Visit: Payer: PRIVATE HEALTH INSURANCE

## 2018-10-26 ENCOUNTER — Ambulatory Visit: Payer: PRIVATE HEALTH INSURANCE | Admitting: Adult Health

## 2018-10-28 ENCOUNTER — Telehealth: Payer: Self-pay | Admitting: *Deleted

## 2018-10-28 ENCOUNTER — Encounter: Payer: Self-pay | Admitting: *Deleted

## 2018-10-28 NOTE — Telephone Encounter (Signed)
Contacted patient for review of chart for virtual visit [facility]) on 10/29/2018 with Dr. Purvis Sheffield at 9:20 am.    Medications reviewed.  No recent hospital visits.  States that she has had labs done recently with her pmd - requested now.  Patient reminded to have weight and vitals ready before the phone call begins.    The patient verbally consented for a WEBEX visit with [facility] and understands that his/her insurance company will be billed for the encounter.

## 2018-10-29 ENCOUNTER — Encounter: Payer: Self-pay | Admitting: Cardiovascular Disease

## 2018-10-29 ENCOUNTER — Telehealth: Payer: Self-pay | Admitting: *Deleted

## 2018-10-29 ENCOUNTER — Encounter: Payer: Self-pay | Admitting: *Deleted

## 2018-10-29 ENCOUNTER — Telehealth (INDEPENDENT_AMBULATORY_CARE_PROVIDER_SITE_OTHER): Payer: PRIVATE HEALTH INSURANCE | Admitting: Cardiovascular Disease

## 2018-10-29 VITALS — BP 152/95 | HR 67 | Ht 67.0 in | Wt 357.5 lb

## 2018-10-29 DIAGNOSIS — R079 Chest pain, unspecified: Secondary | ICD-10-CM | POA: Diagnosis not present

## 2018-10-29 DIAGNOSIS — R030 Elevated blood-pressure reading, without diagnosis of hypertension: Secondary | ICD-10-CM | POA: Diagnosis not present

## 2018-10-29 NOTE — Progress Notes (Signed)
Virtual Visit via Video Note   This visit type was conducted due to national recommendations for restrictions regarding the COVID-19 Pandemic (e.g. social distancing) in an effort to limit this patient's exposure and mitigate transmission in our community.  Due to her co-morbid illnesses, this patient is at least at moderate risk for complications without adequate follow up.  This format is felt to be most appropriate for this patient at this time.  All issues noted in this document were discussed and addressed.  A limited physical exam was performed with this format.  Please refer to the patient's chart for her consent to telehealth for [facility].   Evaluation Performed:  New patient visit  Date:  10/29/2018   ID:  ILAISAANE [name], DOB Mar 25, 1986, MRN [id number]  Patient Location: Home  Provider Location: Office  PCP:  [facility]  Cardiologist:  No primary care provider on file.  Electrophysiologist:  None   Chief Complaint:  Chest pain  History of Present Illness:    [name] [name] is a 33 y.o. female who presents via audio/video conferencing for a telehealth visit today.    She began experiencing chest pain about 4 to 5 months ago.  She said it is inconsistent and usually occurs when she is lifting heavy objects or going up the stairs.  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  It is located in the upper central part of her chest and in the upper right region.  She describes it as sharp and it usually lasts a few seconds and no longer than 30 seconds.  It is not associated with lightheadedness or dizziness.  When asked about shortness of breath, she said she is always short of breath due to her weight.  Symptoms have gotten worse in the last 4 to 5 months and now she has to stop what she is doing to alleviate symptoms.  When she checks her blood pressure at home it is usually 125/90.  She does say that her energy levels  have gone down over the last 4 to 5 months.  She has to take more rest breaks while working.  She said she had an ECG done at her PCPs office 2 weeks ago.  She was told it was normal.  I do not have a copy of this at the present time.  She also said she had blood work done.  She quit smoking 4 to 5 years ago.  She had smoked about 3 packs of cigarettes daily for 4 years.  The patient does not have symptoms concerning for COVID-19 infection (fever, chills, cough, or new shortness of breath).    Family history: Father has hypertension, diabetes, CHF, strokes, MI, and was told he needs a pacemaker.    Past Medical History:  Diagnosis Date  . Abnormal Pap smear   . Anxiety   . Bipolar 1 disorder (HCC)   . Depression   . IUD (intrauterine device) in place 04/12/2013   Had IUD inserted 03/29/13 could not feel strings went to ER 9/18 and KUB saw IUD still could not see string, can't see now but seen in [place]  . Kidney infection   . Pregnancy   . UTI (urinary tract infection)    Past Surgical History:  Procedure Laterality Date  . CESAREAN SECTION    . TOOTH EXTRACTION       Current Meds  Medication Sig  . cetirizine (ZYRTEC) 10 MG  tablet Take 10 mg by mouth 2 (two) times daily.  . Cholecalciferol (VITAMIN D) 50 MCG (2000 UT) tablet Take 6,000 Units by mouth daily.  Marland Kitchen. ibuprofen (ADVIL,MOTRIN) 400 MG tablet Take 400 mg by mouth as needed.  Marland Kitchen. levonorgestrel (MIRENA) 20 MCG/24HR IUD 1 each by Intrauterine route once.  . montelukast (SINGULAIR) 10 MG tablet Take 10 mg by mouth daily.  . traZODone (DESYREL) 50 MG tablet Take 50 mg by mouth at bedtime as needed for sleep.  . vitamin C (ASCORBIC ACID) 250 MG tablet Take 250 mg by mouth daily.     Allergies:   Shellfish allergy   Social History   Tobacco Use  . Smoking status: Former Smoker    Types: Cigarettes    Last attempt to quit: 05/21/2007    Years since quitting: 11.4  . Smokeless tobacco: Never Used  Substance Use Topics  .  Alcohol use: No    Comment: occ.   . Drug use: No     Family Hx: The patient's family history includes Diabetes in her father, paternal aunt, paternal grandfather, paternal grandmother, and paternal uncle; Hypertension in her father and paternal grandmother.  ROS:   Please see the history of present illness.     All other systems reviewed and are negative.   Prior CV studies:   The following studies were reviewed today: None    Labs/Other Tests and Data Reviewed:    EKG:  No ECG reviewed.  Recent Labs: No results found for requested labs within last 8760 hours.   Recent Lipid Panel No results found for: CHOL, TRIG, HDL, CHOLHDL, LDLCALC, LDLDIRECT  Wt Readings from Last 3 Encounters:  10/29/18 (!) 357 lb 8 oz (162.2 kg)  09/29/18 (!) 355 lb (161 kg)  09/01/18 (!) 342 lb (155.1 kg)     Objective:    Vital Signs:  BP (!) 152/95   Pulse 67   Ht 5\' 7"  (1.702 m)   Wt (!) 357 lb 8 oz (162.2 kg)   BMI 55.99 kg/m    Well nourished, well developed female in no acute distress.  HEENT: [place]/at, eomi   ASSESSMENT & PLAN:    1. Chest pain: Symptoms are somewhat atypical for ischemic heart disease.  She is also quite young putting her in a lower risk category.  While she is hypertensive today, she has no documented history of hypertension.  She is morbidly obese which is also a risk factor.  I will obtain a copy of the ECG and labs performed by her PCP. I will order a 2-D echocardiogram with Doppler to evaluate cardiac structure, function, and regional wall motion.  Elective cardiac testing has been delayed until June 2020 due to the COVID19 pandemic.  2.  Elevated blood pressure: I have asked her to check her blood pressure every other day for 3 weeks to see if antihypertensive therapy is indicated.  She could be experiencing symptoms from this as well.    COVID-19 Education: The signs and symptoms of COVID-19 were discussed with the patient and how to seek care for  testing (follow up with PCP or arrange E-visit).  The importance of social distancing was discussed today.  Time:   Today, I have spent 30 minutes with the patient with telehealth technology discussing the above problems.     Medication Adjustments/Labs and Tests Ordered: Current medicines are reviewed at length with the patient today.  Concerns regarding medicines are outlined above.  Tests Ordered: No orders of the  defined types were placed in this encounter.  Medication Changes: No orders of the defined types were placed in this encounter.   Disposition:  Follow up in 3 month(s)  Signed, Prentice Docker, MD  10/29/2018 9:54 AM    [facility]

## 2018-10-29 NOTE — Telephone Encounter (Signed)
Spoke with pt letting her know no visitors or children at [facility] appt. Also, pt hasn't come in contact with anyone in the last month that has been confirmed or suspected of having Covid-19. Pt states she has abd pain but she has a cyst that she thinks is causing that. Pt also has a cough that she has had for 2 months. She thinks it's allergies. It's not a new cough. I advised if any symptoms changed over the weekend, to let us know; otherwise, we would see her Monday. Pt voiced understanding. JSY

## 2018-10-29 NOTE — Patient Instructions (Addendum)
Medication Instructions:  Continue all current medications.  Labwork: none  Testing/Procedures:  Your physician has requested that you have an echocardiogram. Echocardiography is a painless test that uses sound waves to create images of your heart. It provides your doctor with information about the size and shape of your heart and how well your heart's chambers and valves are working. This procedure takes approximately one hour. There are no restrictions for this procedure.  OFFICE WILL SCHEDULE WHEN TESTING OPENS AGAIN DUE TO COVID19  Follow-Up: 3 months   Any Other Special Instructions Will Be Listed Below (If Applicable). Please check your blood pressure every other day x 3 weeks.  Please mail or fax [phone]) readings back to office for MD review.    If you need a refill on your cardiac medications before your next appointment, please call your pharmacy.

## 2018-11-02 ENCOUNTER — Other Ambulatory Visit: Payer: Self-pay

## 2018-11-02 ENCOUNTER — Encounter: Payer: Self-pay | Admitting: Adult Health

## 2018-11-02 ENCOUNTER — Ambulatory Visit (INDEPENDENT_AMBULATORY_CARE_PROVIDER_SITE_OTHER): Payer: PRIVATE HEALTH INSURANCE | Admitting: Adult Health

## 2018-11-02 ENCOUNTER — Ambulatory Visit (INDEPENDENT_AMBULATORY_CARE_PROVIDER_SITE_OTHER): Payer: PRIVATE HEALTH INSURANCE

## 2018-11-02 VITALS — BP 110/70 | HR 89 | Temp 98.3°F | Ht 67.0 in | Wt 363.2 lb

## 2018-11-02 DIAGNOSIS — Z975 Presence of (intrauterine) contraceptive device: Secondary | ICD-10-CM | POA: Diagnosis not present

## 2018-11-02 DIAGNOSIS — Z713 Dietary counseling and surveillance: Secondary | ICD-10-CM

## 2018-11-02 DIAGNOSIS — Z6841 Body Mass Index (BMI) 40.0 and over, adult: Secondary | ICD-10-CM | POA: Diagnosis not present

## 2018-11-02 DIAGNOSIS — Z8742 Personal history of other diseases of the female genital tract: Secondary | ICD-10-CM

## 2018-11-02 DIAGNOSIS — N83209 Unspecified ovarian cyst, unspecified side: Secondary | ICD-10-CM

## 2018-11-02 MED ORDER — PHENTERMINE HCL 37.5 MG PO CAPS: 37.5000 mg | ORAL_CAPSULE | ORAL | 0 refills | Status: DC

## 2018-11-02 NOTE — Progress Notes (Signed)
Patient ID: [name] [name], female   DOB: [birth year], 33 y.o.   MRN: [id number] History of Present Illness: Mahdiya is a 33 year old biracial female in for an [place] to assess for ovarian cysts and IUD position. She says she is wnating to lose weight, has appt with dietician  soon.  PCP is [facility].   Current Medications, Allergies, Past Medical History, Past Surgical History, Family History and Social History were reviewed in [employer] record.     Review of Systems: Has pain in back and feet,feels that some is weight related  Has more acne    Physical Exam:BP 110/70 (BP Location: Right Arm, Patient Position: Sitting, Cuff Size: Normal)   Pulse 89   Temp 98.3 F (36.8 C)   Ht 5\' 7"  (1.702 m)   Wt (!) 363 lb 3.2 oz (164.7 kg)   LMP 11/02/2018   BMI 56.89 kg/m  General:  Well developed, well nourished, no acute distress Skin:  Warm and dry Lungs; Clear to auscultation bilaterally Cardiovascular: Regular rate and rhythm Psych:  No mood changes, alert and cooperative,seems happy Fall risk is low. Discussed [place] results normal uterus and ovaries, no cysts, IUD in place and has multiple simple nabothian cysts. Counseled over weight loss and eating cleaner and making smart choices, try to exercise 30 minutes every day, and will try adipex to jump start efforts, discussed some of the side effects, and that she will need to cut out 500 calories per day to lose a pound a week, which is what she needs to do to continue adipex, and that with a 10 % weight loss should make her feel better, but she may want to consider the Sleeve surgery.  Face time 15 minutes with 50% counseling.   Impression: 1. Weight loss counseling, encounter for     -make better diet choices     - exercise 30 minutes each day     -will Rx adipex .meds     2. BMI 50.0-59.9, adult (HCC)      Meds ordered this encounter  Medications  . phentermine 37.5 MG capsule    Sig: Take 1 capsule (37.5 mg  total) by mouth every morning.    Dispense:  30 capsule    Refill:  0    Order Specific Question:   Supervising Provider    Answer:   Despina Hidden, LUTHER H [[id number]]    3. IUD (intrauterine device) in place      4. History of ovarian cyst     -no cysts on [place] today     Plan: Take adipex Make smarter choices Exercise 30 minutes every day  F/U in 4 weeks for weight and BP check

## 2018-11-02 NOTE — Progress Notes (Signed)
PELVIC US TA/TV: homogeneous anteverted uterus,wnl,EEC 4.5 mm.normal ovaries bilat,mult.simple nabothian cysts,ovaries appear mobile,no free fluid,mild discomfort during ultrasound

## 2018-11-25 ENCOUNTER — Ambulatory Visit (INDEPENDENT_AMBULATORY_CARE_PROVIDER_SITE_OTHER): Payer: PRIVATE HEALTH INSURANCE

## 2018-11-25 DIAGNOSIS — R079 Chest pain, unspecified: Secondary | ICD-10-CM

## 2018-11-26 ENCOUNTER — Other Ambulatory Visit: Payer: Self-pay

## 2018-11-27 ENCOUNTER — Telehealth: Payer: Self-pay | Admitting: *Deleted

## 2018-11-27 NOTE — Telephone Encounter (Signed)
Notes recorded by Lesle Chris, LPN on 10/22/3293 at 11:21 AM EDT Patient notified. Copy to pmd. ------  Notes recorded by Laqueta Linden, MD on 11/26/2018 at 4:33 PM EDT Good results, normal pumping function.

## 2018-11-27 NOTE — Telephone Encounter (Signed)
Pt aware no visitors or children at Monday's appt. Also, pt hasn't come in contact with someone in the last month that has been confirmed or suspected of having Covid-19 nor is she experiencing any symptoms herself. Pt plans on coming to appt. JSY 

## 2018-11-30 ENCOUNTER — Other Ambulatory Visit: Payer: Self-pay

## 2018-11-30 ENCOUNTER — Telehealth: Payer: Self-pay | Admitting: Cardiovascular Disease

## 2018-11-30 ENCOUNTER — Encounter: Payer: Self-pay | Admitting: Adult Health

## 2018-11-30 ENCOUNTER — Ambulatory Visit (INDEPENDENT_AMBULATORY_CARE_PROVIDER_SITE_OTHER): Payer: PRIVATE HEALTH INSURANCE | Admitting: Adult Health

## 2018-11-30 VITALS — BP 135/91 | HR 83 | Temp 98.7°F | Ht 65.0 in | Wt 355.4 lb

## 2018-11-30 DIAGNOSIS — Z713 Dietary counseling and surveillance: Secondary | ICD-10-CM | POA: Insufficient documentation

## 2018-11-30 DIAGNOSIS — Z6841 Body Mass Index (BMI) 40.0 and over, adult: Secondary | ICD-10-CM | POA: Insufficient documentation

## 2018-11-30 MED ORDER — PHENTERMINE HCL 37.5 MG PO CAPS: 37.5000 mg | ORAL_CAPSULE | ORAL | 0 refills | Status: DC

## 2018-11-30 NOTE — Telephone Encounter (Signed)
See my note on 10/29/18. "Symptoms are somewhat atypical for ischemic heart disease.  She is also quite young putting her in a lower risk category." Hence, I don't feel she needs one. Also, her cardiac function is normal.

## 2018-11-30 NOTE — Telephone Encounter (Signed)
Asking why Stress test was not ordered

## 2018-11-30 NOTE — Telephone Encounter (Signed)
I relayed this to MA who does the referrals there, who had also called with the question

## 2018-11-30 NOTE — Progress Notes (Signed)
Patient ID: [name] [name], female   DOB: [birth year], 33 y.o.   MRN: [id number] History of Present Illness: [name] [name] is a 33 year old black female, [id number], in for a weight and BP check, has been on adipex for about a month now. PCP is [facility], Antony Odea NP.    Current Medications, Allergies, Past Medical History, Past Surgical History, Family History and Social History were reviewed in [employer] record.     Review of Systems: +weight loss, no other concerns     Physical Exam:BP (!) 135/91 (BP Location: Right Arm, Patient Position: Sitting, Cuff Size: Normal)   Pulse 83   Temp 98.7 F (37.1 C)   Ht 5\' 5"  (1.651 m)   Wt (!) 355 lb 6.4 oz (161.2 kg)   LMP 11/23/2018   BMI 59.14 kg/m  General:  Well developed, well nourished, no acute distress Skin:  Warm and dry Neck:  Midline trachea, normal thyroid, good ROM, no lymphadenopathy Lungs; Clear to auscultation bilaterally Cardiovascular: Regular rate and rhythm Psych:  No mood changes, alert and cooperative,seems happy Has lost 8 lbs, which is great, now increase walking. Will continue adipex.  Impression and Plan: 1. Weight loss counseling, encounter for Will refill adipex Meds ordered this encounter  Medications  . phentermine 37.5 MG capsule    Sig: Take 1 capsule (37.5 mg total) by mouth every morning.    Dispense:  30 capsule    Refill:  0    Order Specific Question:   Supervising Provider    Answer:   Despina Hidden, LUTHER H [[id number]]  Increase walking  Follow up in 4 weeks for weight and BP check   2. BMI 50.0-59.9, adult (HCC)

## 2018-12-01 ENCOUNTER — Ambulatory Visit: Payer: Self-pay | Admitting: Adult Health

## 2018-12-16 ENCOUNTER — Encounter: Payer: Self-pay | Admitting: Orthopedic Surgery

## 2018-12-16 ENCOUNTER — Other Ambulatory Visit: Payer: Self-pay

## 2018-12-16 ENCOUNTER — Ambulatory Visit (INDEPENDENT_AMBULATORY_CARE_PROVIDER_SITE_OTHER): Payer: PRIVATE HEALTH INSURANCE

## 2018-12-16 ENCOUNTER — Ambulatory Visit (INDEPENDENT_AMBULATORY_CARE_PROVIDER_SITE_OTHER): Payer: PRIVATE HEALTH INSURANCE | Admitting: Orthopedic Surgery

## 2018-12-16 ENCOUNTER — Ambulatory Visit: Payer: Self-pay | Admitting: Nutrition

## 2018-12-16 VITALS — BP 122/85 | HR 91 | Temp 97.5°F | Ht 65.0 in | Wt 357.0 lb

## 2018-12-16 DIAGNOSIS — M79672 Pain in left foot: Secondary | ICD-10-CM

## 2018-12-16 DIAGNOSIS — M722 Plantar fascial fibromatosis: Secondary | ICD-10-CM

## 2018-12-16 DIAGNOSIS — G8929 Other chronic pain: Secondary | ICD-10-CM

## 2018-12-16 DIAGNOSIS — Z6841 Body Mass Index (BMI) 40.0 and over, adult: Secondary | ICD-10-CM | POA: Diagnosis not present

## 2018-12-16 NOTE — Progress Notes (Addendum)
NEW PROBLEM//OFFICE VISIT  Chief Complaint  Patient presents with  . Foot Pain    Bilat/ left foot worse    33 year old female with obesity and a BMI of 50+ no other medical problems presents with a 5-year history of left heel pain treated with cortisone injection with no relief  She has classic restart pain after sitting or getting up in the morning.  The pain is dull the pain is moderate timing is intermittent after sitting   Review of Systems  All other systems reviewed and are negative.    Past Medical History:  Diagnosis Date  . Abnormal Pap smear   . Anxiety   . Bipolar 1 disorder (HCC)   . Depression   . IUD (intrauterine device) in place 04/12/2013   Had IUD inserted 03/29/13 could not feel strings went to ER 9/18 and KUB saw IUD still could not see string, can't see now but seen in [place]  . Kidney infection   . Pregnancy   . UTI (urinary tract infection)     Past Surgical History:  Procedure Laterality Date  . CESAREAN SECTION    . TOOTH EXTRACTION      Family History  Problem Relation Age of Onset  . Hypertension Father   . Diabetes Father   . Congestive Heart Failure Father   . Hypertension Paternal Grandmother   . Diabetes Paternal Grandmother   . Diabetes Paternal Grandfather   . Diabetes Paternal Aunt   . Diabetes Paternal Uncle    Social History   Tobacco Use  . Smoking status: Former Smoker    Types: Cigarettes    Last attempt to quit: 05/21/2007    Years since quitting: 11.5  . Smokeless tobacco: Never Used  Substance Use Topics  . Alcohol use: No    Comment: occ.   . Drug use: No    Allergies  Allergen Reactions  . Shellfish Allergy Anaphylaxis    Throat swelling    Current Meds  Medication Sig  . ARIPiprazole (ABILIFY) 10 MG tablet Take 10 mg by mouth daily.  . cetirizine (ZYRTEC) 10 MG tablet Take 10 mg by mouth 2 (two) times daily.  . Cholecalciferol (VITAMIN D) 50 MCG (2000 UT) tablet Take 6,000 Units by mouth daily.  Marland Kitchen  ibuprofen (ADVIL,MOTRIN) 400 MG tablet Take 400 mg by mouth as needed.  Marland Kitchen levonorgestrel (MIRENA) 20 MCG/24HR IUD 1 each by Intrauterine route once.  . montelukast (SINGULAIR) 10 MG tablet Take 10 mg by mouth daily.  . phentermine 37.5 MG capsule Take 1 capsule (37.5 mg total) by mouth every morning.  . Pseudoephedrine HCl (SUDAFED PO) Take by mouth daily.  . traZODone (DESYREL) 50 MG tablet Take 50 mg by mouth at bedtime as needed for sleep.  . vitamin C (ASCORBIC ACID) 250 MG tablet Take 250 mg by mouth daily.    BP 122/85   Pulse 91   Temp (!) 97.5 F (36.4 C)   Ht 5\' 5"  (1.651 m)   Wt (!) 357 lb (161.9 kg)   LMP 11/23/2018   BMI 59.41 kg/m   Physical Exam Vitals signs and nursing note reviewed.  Constitutional:      Appearance: Normal appearance.  Musculoskeletal:       Feet:  Neurological:     Mental Status: She is alert and oriented to person, place, and time.     Gait: Gait is intact. Gait normal.  Psychiatric:        Mood and Affect: Mood normal.  Ortho Exam   SEE ABOVE   MEDICAL DECISION SECTION  Imaging: Radiographs were done at [facility] The report has been reviewed.  I have made an independent evaluation and interpretation of the imaging: Heel spur and calcification Achilles tendon see report  Prior records including all forwarded office visits plus or minus emergency room visits have been reviewed and confirm the history given by the patient.   Encounter Diagnoses  Name Primary?  . Chronic foot pain, left   . Plantar fasciitis Yes  . Body mass index 50.0-59.9, adult (HCC)   . Morbid obesity (HCC)    The patient meets the AMA guidelines for Morbid (severe) obesity with a BMI > 40.0 and I have recommended weight loss. Aram BeechamCynthia is already seeing a nutritionist and is already on medication for weight loss   Patient will be treated for plantar fasciitis TULI cups Home exercise program Cryotherapy Ibuprofen 40 mg 3 times a day for 3  weeks with food   No orders of the defined types were placed in this encounter.   Fuller CanadaStanley Aayansh Codispoti, MD  12/16/2018 10:41 AM

## 2018-12-16 NOTE — Patient Instructions (Addendum)
Call [facility] to arrange for fitting for the heel cups to go in your shoes TULI cups You will do the Home exercise program for 6 weeks Cryotherapy/ice applied to left heel with a frozen water bottle rolling the foot over the bottle for 20 to 30 minutes Take ibuprofen 400 mg 3 times a day for 3 weeks with food     Plantar Fasciitis  Plantar fasciitis is a painful foot condition that affects the heel. It occurs when the band of tissue that connects the toes to the heel bone (plantar fascia) becomes irritated. This can happen as the result of exercising too much or doing other repetitive activities (overuse injury). The pain from plantar fasciitis can range from mild irritation to severe pain that makes it difficult to walk or move. The pain is usually worse in the morning after sleeping, or after sitting or lying down for a while. Pain may also be worse after long periods of walking or standing. What are the causes? This condition may be caused by:  Standing for long periods of time.  Wearing shoes that do not have good arch support.  Doing activities that put stress on joints (high-impact activities), including running, aerobics, and ballet.  Being overweight.  An abnormal way of walking (gait).  Tight muscles in the back of your lower leg (calf).  High arches in your feet.  Starting a new athletic activity. What are the signs or symptoms? The main symptom of this condition is heel pain. Pain may:  Be worse with first steps after a time of rest, especially in the morning after sleeping or after you have been sitting or lying down for a while.  Be worse after long periods of standing still.  Decrease after 30-45 minutes of activity, such as gentle walking. How is this diagnosed? This condition may be diagnosed based on your medical history and your symptoms. Your health care provider may ask questions about your activity level. Your health care provider will do a  physical exam to check for:  A tender area on the bottom of your foot.  A high arch in your foot.  Pain when you move your foot.  Difficulty moving your foot. You may have imaging tests to confirm the diagnosis, such as:  X-rays.  Ultrasound.  MRI. How is this treated? Treatment for plantar fasciitis depends on how severe your condition is. Treatment may include:  Rest, ice, applying pressure (compression), and raising the affected foot (elevation). This may be called RICE therapy. Your health care provider may recommend RICE therapy along with over-the-counter pain medicines to manage your pain.  Exercises to stretch your calves and your plantar fascia.  A splint that holds your foot in a stretched, upward position while you sleep (night splint).  Physical therapy to relieve symptoms and prevent problems in the future.  Injections of steroid medicine (cortisone) to relieve pain and inflammation.  Stimulating your plantar fascia with electrical impulses (extracorporeal shock wave therapy). This is usually the last treatment option before surgery.  Surgery, if other treatments have not worked after 12 months. Follow these instructions at home:  Managing pain, stiffness, and swelling  If directed, put ice on the painful area: ? Put ice in a plastic bag, or use a frozen bottle of water. ? Place a towel between your skin and the bag or bottle. ? Roll the bottom of your foot over the bag or bottle. ? Do this for 20 minutes, 2-3 times a day.  Wear athletic shoes that have air-sole or gel-sole cushions, or try wearing soft shoe inserts that are designed for plantar fasciitis.  Raise (elevate) your foot above the level of your heart while you are sitting or lying down. Activity  Avoid activities that cause pain. Ask your health care provider what activities are safe for you.  Do physical therapy exercises and stretches as told by your health care provider.  Try activities  and forms of exercise that are easier on your joints (low-impact). Examples include swimming, water aerobics, and biking. General instructions  Take over-the-counter and prescription medicines only as told by your health care provider.  Wear a night splint while sleeping, if told by your health care provider. Loosen the splint if your toes tingle, become numb, or turn cold and blue.  Maintain a healthy weight, or work with your health care provider to lose weight as needed.  Keep all follow-up visits as told by your health care provider. This is important. Contact a health care provider if you:  Have symptoms that do not go away after caring for yourself at home.  Have pain that gets worse.  Have pain that affects your ability to move or do your daily activities. Summary  Plantar fasciitis is a painful foot condition that affects the heel. It occurs when the band of tissue that connects the toes to the heel bone (plantar fascia) becomes irritated.  The main symptom of this condition is heel pain that may be worse after exercising too much or standing still for a long time.  Treatment varies, but it usually starts with rest, ice, compression, and elevation (RICE therapy) and over-the-counter medicines to manage pain. This information is not intended to replace advice given to you by your health care provider. Make sure you discuss any questions you have with your health care provider. Document Released: 04/02/2001 Document Revised: 05/05/2017 Document Reviewed: 05/05/2017 Elsevier Interactive Patient Education  2019 [employer].

## 2018-12-25 ENCOUNTER — Encounter: Payer: Self-pay | Admitting: *Deleted

## 2018-12-28 ENCOUNTER — Other Ambulatory Visit: Payer: Self-pay

## 2018-12-28 ENCOUNTER — Ambulatory Visit (INDEPENDENT_AMBULATORY_CARE_PROVIDER_SITE_OTHER): Payer: PRIVATE HEALTH INSURANCE | Admitting: Adult Health

## 2018-12-28 ENCOUNTER — Encounter: Payer: Self-pay | Admitting: Adult Health

## 2018-12-28 VITALS — BP 130/89 | HR 86 | Ht 65.0 in | Wt 357.0 lb

## 2018-12-28 DIAGNOSIS — Z713 Dietary counseling and surveillance: Secondary | ICD-10-CM

## 2018-12-28 DIAGNOSIS — Z6841 Body Mass Index (BMI) 40.0 and over, adult: Secondary | ICD-10-CM

## 2018-12-28 MED ORDER — PHENTERMINE HCL 37.5 MG PO CAPS: 37.5000 mg | ORAL_CAPSULE | ORAL | 0 refills | Status: DC

## 2018-12-28 NOTE — Progress Notes (Signed)
Patient ID: [name] [name], female   DOB: 1986/05/20, 33 y.o.   MRN: [id number] History of Present Illness:  [name] [name] is a 33 year old black female in for a weight and BP check. PCP Rosealee Albee NP.  Current Medications, Allergies, Past Medical History, Past Surgical History, Family History and Social History were reviewed in [employer] record.     Review of Systems: Having pain in toes, so not walking    Physical Exam:BP 130/89 (BP Location: Left Arm, Patient Position: Sitting, Cuff Size: Normal)   Pulse 86   Ht 5\' 5"  (1.651 m)   Wt (!) 357 lb (161.9 kg)   BMI 59.41 kg/m   She gained about 1.5 lbs, but has not walked as much General:  Well developed, well nourished, no acute distress Skin:  Warm and dry Lungs; Clear to auscultation bilaterally Cardiovascular: Regular rate and rhythm Psych:  No mood changes, alert and cooperative,seems happy PHQ 2 score 0.  Discussed sleeve surgery as option, will try adipex another month and she has appt 6/11 with nutritionist at [facility].  Impression: 1. Weight loss counseling, encounter for   2. BMI 50.0-59.9, adult [facility])       Plan: Will Rx adipex Meds ordered this encounter  Medications  . phentermine 37.5 MG capsule    Sig: Take 1 capsule (37.5 mg total) by mouth every morning.    Dispense:  30 capsule    Refill:  0    Order Specific Question:   Supervising Provider    Answer:   Florian Buff [[id number]]  -keep appt with Nutritionist 6/11 -try to work more Follow up in 4 weeks

## 2018-12-31 ENCOUNTER — Encounter: Payer: Self-pay | Admitting: Nutrition

## 2018-12-31 ENCOUNTER — Other Ambulatory Visit: Payer: Self-pay

## 2018-12-31 ENCOUNTER — Encounter: Payer: PRIVATE HEALTH INSURANCE | Attending: General Practice | Admitting: Nutrition

## 2018-12-31 NOTE — Progress Notes (Signed)
  Medical Nutrition Therapy:  Appt start time: 0800 end time:  0900.   Assessment:  Primary concerns today:  Morbid Obesity. Single mom with 31 yr old child.Works as a [employer]ar[employer] manager. Is on Trazadone and Ambilify for her depression. Sees a counselor for her depression. She admits when depressed she eats. Says she doesn't have a lot of time to cook and prepare meals. Has tried Melatonin and it didn't work.  Is currently on phenteramine 37.5 mg daily to assist with weight loss. Not physically active with exercise. Willing start walking and change eating habits for weight loss.   Preferred Learning Style:   No preference indicated   Learning Readiness:   Contemplating    MEDICATIONS:    DIETARY INTAKE:    24-hr recall:  B ( 8 AM): 3 clementimes oranges, 1/2 bagel, water Snk ( AM):  L ( PM):   Skipped Kit cat bar, water (usually leftovers from dinner) Snk ( PM): D ( PM): 2 [place] and cheese on white bread, water Snk ( PM): 3 oranges clementimes;, nabs Beverages: water  Usual physical activity: ADL  Estimated energy needs: 1200 calories 135 g carbohydrates 90 g protein 33 g fat  Progress Towards Goal(s):  In progress.   Nutritional Diagnosis:  NB-1.1 Food and nutrition-related knowledge deficit As related to Obesity.  As evidenced by BMI > 50.    Intervention:  Nutrition and Pre Diabetes education provided on My Plate, CHO counting, meal planning, portion sizes, timing of meals, avoiding snacks between meals,  taking medications as prescribed, benefits of exercising 30 minutes per day and prevention  of DM. Weight loss tips and food journal. My fitness Pal app.  Goals Follow My Plate Eat three balanced meals at times discussed Cut out processed foods. No snacks between meals unless veggies Drink gallon of water per day Increase vegetables with lunch and dinner Walk 30 minutes a day Lose 1-2 lb per week Keep food journal and use My Fitness Pal app. Measure foods  out.  Teaching Method Utilized:  Visual Auditory  Handouts given during visit include:  Verbally went over My Plate   Barriers to learning/adherence to lifestyle change: none  Demonstrated degree of understanding via:  Teach Back   Monitoring/Evaluation:  Dietary intake, exercise, , and body weight in 1 month(s). Suggest checking an A1C if not done recently to rule out DM Type 2.

## 2018-12-31 NOTE — Patient Instructions (Signed)
Goals Follow My Plate Eat three balanced meals at times discussed Cut out processed foods. No snacks between meals unless veggies Drink gallon of water per day Increase vegetables with lunch and dinner Walk 30 minutes a day Lose 1-2 lb per week Keep food journal and use My Fitness Pal app. Measure foods out.

## 2019-01-27 ENCOUNTER — Other Ambulatory Visit (HOSPITAL_BASED_OUTPATIENT_CLINIC_OR_DEPARTMENT_OTHER): Payer: Self-pay

## 2019-01-27 DIAGNOSIS — R0683 Snoring: Secondary | ICD-10-CM

## 2019-01-27 DIAGNOSIS — G4730 Sleep apnea, unspecified: Secondary | ICD-10-CM

## 2019-01-28 ENCOUNTER — Ambulatory Visit: Payer: PRIVATE HEALTH INSURANCE | Admitting: Adult Health

## 2019-01-29 ENCOUNTER — Ambulatory Visit: Payer: PRIVATE HEALTH INSURANCE | Attending: General Practice | Admitting: Internal Medicine

## 2019-01-29 ENCOUNTER — Other Ambulatory Visit: Payer: Self-pay

## 2019-01-29 DIAGNOSIS — R0683 Snoring: Secondary | ICD-10-CM | POA: Insufficient documentation

## 2019-01-29 DIAGNOSIS — G4730 Sleep apnea, unspecified: Secondary | ICD-10-CM

## 2019-01-29 DIAGNOSIS — Z6841 Body Mass Index (BMI) 40.0 and over, adult: Secondary | ICD-10-CM | POA: Diagnosis not present

## 2019-01-31 ENCOUNTER — Ambulatory Visit: Payer: PRIVATE HEALTH INSURANCE

## 2019-02-02 ENCOUNTER — Ambulatory Visit: Payer: Self-pay | Admitting: Cardiovascular Disease

## 2019-02-02 ENCOUNTER — Telehealth (INDEPENDENT_AMBULATORY_CARE_PROVIDER_SITE_OTHER): Payer: PRIVATE HEALTH INSURANCE | Admitting: Cardiovascular Disease

## 2019-02-02 ENCOUNTER — Encounter: Payer: Self-pay | Admitting: Cardiovascular Disease

## 2019-02-02 VITALS — BP 159/79 | HR 59 | Ht 62.0 in | Wt 353.0 lb

## 2019-02-02 DIAGNOSIS — R030 Elevated blood-pressure reading, without diagnosis of hypertension: Secondary | ICD-10-CM | POA: Diagnosis not present

## 2019-02-02 DIAGNOSIS — F419 Anxiety disorder, unspecified: Secondary | ICD-10-CM

## 2019-02-02 DIAGNOSIS — R0789 Other chest pain: Secondary | ICD-10-CM

## 2019-02-02 NOTE — Patient Instructions (Signed)
Your physician recommends that you schedule a follow-up appointment in: AS NEEDED WITH DR KONESWARAN  Your physician recommends that you continue on your current medications as directed. Please refer to the Current Medication list given to you today.  Thank you for choosing  [facility]!!    

## 2019-02-02 NOTE — Progress Notes (Signed)
Virtual Visit via Phone Note (video attempted but there were technical difficulties)   This visit type was conducted due to national recommendations for restrictions regarding the COVID-19 Pandemic (e.g. social distancing) in an effort to limit this patient's exposure and mitigate transmission in our community.  Due to her co-morbid illnesses, this patient is at least at moderate risk for complications without adequate follow up.  This format is felt to be most appropriate for this patient at this time.  All issues noted in this document were discussed and addressed.  A limited physical exam could not be performed with this format.  Please refer to the patient's chart for her consent to telehealth for [facility].   Date:  02/02/2019   ID:  [name] [name], DOB 02-27-1986, MRN [id number]  Patient Location: Home Provider Location: Office  PCP:  Wyatt Haste, NP  Cardiologist:  Kate Sable, MD  Electrophysiologist:  None   Evaluation Performed:  Follow-Up Visit  Chief Complaint: Atypical chest pain  History of Present Illness:    [name] [name] is a 32 y.o. female with atypical chest pain.  Initially evaluated her on 10/29/2018.  She was started on clonazepam and she told me she is feeling much better and currently denies any chest pain, palpitations, and shortness of breath.  She has been checking her blood pressure and it has routinely been normal.  She told me she has been eating a lot of ham this week and thinks that is why her blood pressure is elevated this morning.  The patient does not have symptoms concerning for COVID-19 infection (fever, chills, cough, or new shortness of breath).    Past Medical History:  Diagnosis Date  . Abnormal Pap smear   . Anxiety   . Bipolar 1 disorder (Brenda)   . Depression   . IUD (intrauterine device) in place 04/12/2013   Had IUD inserted 03/29/13 could not feel strings went to ER 9/18 and KUB saw IUD still could not see string,  can't see now but seen in [place]  . Kidney infection   . Pregnancy   . UTI (urinary tract infection)    Past Surgical History:  Procedure Laterality Date  . CESAREAN SECTION    . TOOTH EXTRACTION       Current Meds  Medication Sig  . ARIPiprazole (ABILIFY) 10 MG tablet Take 10 mg by mouth daily.  . cetirizine (ZYRTEC) 10 MG tablet Take 10 mg by mouth 2 (two) times daily.  . Cholecalciferol (VITAMIN D) 50 MCG (2000 UT) tablet Take 6,000 Units by mouth daily.  Marland Kitchen ibuprofen (ADVIL,MOTRIN) 400 MG tablet Take 400 mg by mouth as needed.  Marland Kitchen levonorgestrel (MIRENA) 20 MCG/24HR IUD 1 each by Intrauterine route once.  . montelukast (SINGULAIR) 10 MG tablet Take 10 mg by mouth daily.  . Pseudoephedrine HCl (SUDAFED PO) Take by mouth daily.  . traZODone (DESYREL) 50 MG tablet Take 50 mg by mouth at bedtime as needed for sleep.  . vitamin C (ASCORBIC ACID) 250 MG tablet Take 250 mg by mouth daily.  . [DISCONTINUED] phentermine 37.5 MG capsule Take 1 capsule (37.5 mg total) by mouth every morning.     Allergies:   Shellfish allergy   Social History   Tobacco Use  . Smoking status: Former Smoker    Types: Cigarettes    Quit date: 05/21/2007    Years since quitting: 11.7  . Smokeless tobacco: Never Used  Substance Use Topics  . Alcohol use: No  Comment: occ.   . Drug use: No     Family Hx: The patient's family history includes Congestive Heart Failure in her father; Diabetes in her father, paternal aunt, paternal grandfather, paternal grandmother, and paternal uncle; Hypertension in her father and paternal grandmother.  ROS:   Please see the history of present illness.     All other systems reviewed and are negative.   Prior CV studies:   The following studies were reviewed today:  Echocardiogram 11/25/2018:  1. The left ventricle has low normal systolic function, with an ejection fraction of 50-55%. Image quality not adequate to assess focal wall motion. The cavity size was normal.  Left ventricular diastolic parameters were normal.  2. The right ventricle has normal systolic function. The cavity was normal. There is no increase in right ventricular wall thickness. Right ventricular systolic pressure normal with an estimated pressure of 23.2 mmHg.  3. The mitral valve is grossly normal.  4. The tricuspid valve is grossly normal.  5. The aortic valve is tricuspid.  6. The aortic root is normal in size and structure.  Labs/Other Tests and Data Reviewed:    EKG:  No ECG reviewed.  Recent Labs: No results found for requested labs within last 8760 hours.   Recent Lipid Panel No results found for: CHOL, TRIG, HDL, CHOLHDL, LDLCALC, LDLDIRECT  Wt Readings from Last 3 Encounters:  02/02/19 (!) 353 lb (160.1 kg)  12/28/18 (!) 357 lb (161.9 kg)  12/16/18 (!) 357 lb (161.9 kg)     Objective:    Vital Signs:  BP (!) 159/79   Pulse (!) 59   Ht 5\' 2"  (1.575 m)   Wt (!) 353 lb (160.1 kg)   BMI 64.56 kg/m    VITAL SIGNS:  reviewed  ASSESSMENT & PLAN:    1.  Atypical chest pain:  Symptoms have resolved with clonazepam.  It appears her previous symptoms were due to anxiety.  2.  Elevated blood pressure: She has been checking and it has consistently been normal.  She has been eating a lot of ham this week and attributes her elevated blood pressure this morning to this.  This will need further monitoring by her PCP.  3.  Anxiety: Symptoms are stable on clonazepam.     COVID-19 Education: The signs and symptoms of COVID-19 were discussed with the patient and how to seek care for testing (follow up with PCP or arrange E-visit).  The importance of social distancing was discussed today.  Time:   Today, I have spent 5 minutes with the patient with telehealth technology discussing the above problems.     Medication Adjustments/Labs and Tests Ordered: Current medicines are reviewed at length with the patient today.  Concerns regarding medicines are outlined above.    Tests Ordered: No orders of the defined types were placed in this encounter.   Medication Changes: No orders of the defined types were placed in this encounter.   Follow Up: prn  Signed, Prentice DockerSuresh Koneswaran, MD  02/02/2019 8:44 AM    [facility]

## 2019-02-08 ENCOUNTER — Ambulatory Visit: Payer: PRIVATE HEALTH INSURANCE | Admitting: Nutrition

## 2019-02-09 ENCOUNTER — Encounter: Payer: Self-pay | Admitting: Nutrition

## 2019-02-09 ENCOUNTER — Encounter: Payer: PRIVATE HEALTH INSURANCE | Attending: General Practice | Admitting: Nutrition

## 2019-02-09 ENCOUNTER — Other Ambulatory Visit: Payer: Self-pay

## 2019-02-09 ENCOUNTER — Telehealth: Payer: Self-pay | Admitting: Nutrition

## 2019-02-09 NOTE — Patient Instructions (Addendum)
Goals Drink only water daily Pack lunch for work Eat breakfast daily. Cut out processed foods. Increase exercise of walking. Lose 1-2 lbs per week.

## 2019-02-09 NOTE — Progress Notes (Signed)
  Medical Nutrition Therapy:  Appt start time: 0800 end time: 0815   Assessment:  Primary concerns today:  Morbid Obesity. PreDM  Changes made: cutting out snacks. Hasn't had having cravings for sugars anymore. Cut out the recees candy and skittles. Drinking mostly water. Still working on eating more fresh fruits and vegetables but due to finances it's limited.. Lost 9 lbs sine last visit.  Been trying to walk a little more. Has cut out eating out 98% of the time. Feels better. Still doesn't get hungry for breakfast much. Working on eating something that is helpful.  Preferred Learning Style:   No preference indicated   Learning Readiness:   Contemplating    MEDICATIONS:    DIETARY INTAKE:    24-hr recall:  B ( 8 AM): skipped  Snk ( AM):  L ( PM):  Hot pocket  Snk ( PM): D ( PM): Pot roast, green beans and baked beans, Koollaid, Snk ( PM):  Drinking: water, koolaid, Usual physical activity: ADL  Estimated energy needs: 1200 calories 135 g carbohydrates 90 g protein 33 g fat  Progress Towards Goal(s):  In progress.   Nutritional Diagnosis:  NB-1.1 Food and nutrition-related knowledge deficit As related to Obesity.  As evidenced by BMI > 50.    Intervention:  Nutrition and Pre Diabetes education provided on My Plate, CHO counting, meal planning, portion sizes, timing of meals, avoiding snacks between meals,  taking medications as prescribed, benefits of exercising 30 minutes per day and prevention  of DM. Weight loss tips and food journal. My fitness Pal app. Goals Drink only water daily Pack lunch for work Eat breakfast daily. Cut out processed foods. Increase exercise of walking. Lose 1-2 lbs per week.   Teaching Method Utilized:  Visual Auditory  Handouts given during visit include:  Verbally went over My Plate   Barriers to learning/adherence to lifestyle change: none  Demonstrated degree of understanding via:  Teach Back    Monitoring/Evaluation:  Dietary intake, exercise, , and body weight in 1 month(s). Suggest checking an A1C if not done recently to rule out DM Type 2.

## 2019-02-09 NOTE — Telephone Encounter (Signed)
Visit completed.

## 2019-02-22 NOTE — Progress Notes (Deleted)
Psychiatric Initial Adult Assessment   Patient Identification: [name] [name] MRN:  [id number] Date of Evaluation:  02/22/2019 Referral Source:  Chief Complaint:   Visit Diagnosis: No diagnosis found.  History of Present Illness:   [name] [name] is a 33 y.o. year old female with a history of bipolar disorder, who is referred for bipolar disorder.      Associated Signs/Symptoms: Depression Symptoms:  {DEPRESSION SYMPTOMS:20000} (Hypo) Manic Symptoms:  {BHH MANIC SYMPTOMS:22872} Anxiety Symptoms:  {BHH ANXIETY SYMPTOMS:22873} Psychotic Symptoms:  {BHH PSYCHOTIC SYMPTOMS:22874} PTSD Symptoms: {BHH PTSD SYMPTOMS:22875}  Past Psychiatric History:  Outpatient:  Psychiatry admission:  Previous suicide attempt:  Past trials of medication:  History of violence:   Previous Psychotropic Medications: {YES/NO:21197}  Substance Abuse History in the last 12 months:  {yes no:314532}  Consequences of Substance Abuse: {BHH CONSEQUENCES OF SUBSTANCE ABUSE:22880}  Past Medical History:  Past Medical History:  Diagnosis Date  . Abnormal Pap smear   . Anxiety   . Bipolar 1 disorder ([place])   . Depression   . IUD (intrauterine device) in place 04/12/2013   Had IUD inserted 03/29/13 could not feel strings went to ER 9/18 and KUB saw IUD still could not see string, can't see now but seen in [place]  . Kidney infection   . Pregnancy   . UTI (urinary tract infection)     Past Surgical History:  Procedure Laterality Date  . CESAREAN SECTION    . TOOTH EXTRACTION      Family Psychiatric History: ***  Family History:  Family History  Problem Relation Age of Onset  . Hypertension Father   . Diabetes Father   . Congestive Heart Failure Father   . Hypertension Paternal Grandmother   . Diabetes Paternal Grandmother   . Diabetes Paternal Grandfather   . Diabetes Paternal Aunt   . Diabetes Paternal Uncle     Social History:   Social History   Socioeconomic History  . Marital status:  Married    Spouse name: Not on file  . Number of children: 2  . Years of education: Not on file  . Highest education level: Not on file  Occupational History  . Not on file  Social Needs  . Financial resource strain: Not on file  . Food insecurity    Worry: Not on file    Inability: Not on file  . Transportation needs    Medical: Not on file    Non-medical: Not on file  Tobacco Use  . Smoking status: Former Smoker    Types: Cigarettes    Quit date: 05/21/2007    Years since quitting: 11.7  . Smokeless tobacco: Never Used  Substance and Sexual Activity  . Alcohol use: No    Comment: occ.   . Drug use: No  . Sexual activity: Not Currently    Partners: Male    Birth control/protection: I.U.D.  Lifestyle  . Physical activity    Days per week: Not on file    Minutes per session: Not on file  . Stress: Not on file  Relationships  . Social Herbalist on phone: Not on file    Gets together: Not on file    Attends religious service: Not on file    Active member of club or organization: Not on file    Attends meetings of clubs or organizations: Not on file    Relationship status: Not on file  Other Topics Concern  . Not on file  Social  History Narrative  . Not on file    Additional Social History: ***  Allergies:   Allergies  Allergen Reactions  . Shellfish Allergy Anaphylaxis    Throat swelling    Metabolic Disorder Labs: Lab Results  Component Value Date   HGBA1C 5.7 (H) 05/12/2013   MPG 117 (H) 05/12/2013   No results found for: PROLACTIN No results found for: CHOL, TRIG, HDL, CHOLHDL, VLDL, LDLCALC Lab Results  Component Value Date   TSH 1.425 05/12/2013    Therapeutic Level Labs: No results found for: LITHIUM No results found for: CBMZ No results found for: VALPROATE  Current Medications: Current Outpatient Medications  Medication Sig Dispense Refill  . ARIPiprazole (ABILIFY) 10 MG tablet Take 10 mg by mouth daily.    . cetirizine  (ZYRTEC) 10 MG tablet Take 10 mg by mouth 2 (two) times daily.    . Cholecalciferol (VITAMIN D) 50 MCG (2000 UT) tablet Take 6,000 Units by mouth daily.    Marland Kitchen. ibuprofen (ADVIL,MOTRIN) 400 MG tablet Take 400 mg by mouth as needed.    Marland Kitchen. levonorgestrel (MIRENA) 20 MCG/24HR IUD 1 each by Intrauterine route once.    . montelukast (SINGULAIR) 10 MG tablet Take 10 mg by mouth daily.    . Pseudoephedrine HCl (SUDAFED PO) Take by mouth daily.    . traZODone (DESYREL) 50 MG tablet Take 50 mg by mouth at bedtime as needed for sleep.    . vitamin C (ASCORBIC ACID) 250 MG tablet Take 250 mg by mouth daily.     No current facility-administered medications for this visit.     Musculoskeletal: Strength & Muscle Tone: N/A Gait & Station: N/A Patient leans: N/A  Psychiatric Specialty Exam: ROS  There were no vitals taken for this visit.There is no height or weight on file to calculate BMI.  General Appearance: {Appearance:22683}  Eye Contact:  {BHH EYE CONTACT:22684}  Speech:  Clear and Coherent  Volume:  Normal  Mood:  {BHH MOOD:22306}  Affect:  {Affect (PAA):22687}  Thought Process:  Coherent  Orientation:  Full (Time, Place, and Person)  Thought Content:  Logical  Suicidal Thoughts:  {ST/HT (PAA):22692}  Homicidal Thoughts:  {ST/HT (PAA):22692}  Memory:  Immediate;   Good  Judgement:  {Judgement (PAA):22694}  Insight:  {Insight (PAA):22695}  Psychomotor Activity:  Normal  Concentration:  Concentration: Good and Attention Span: Good  Recall:  Good  Fund of Knowledge:Good  Language: Good  Akathisia:  No  Handed:  Right  AIMS (if indicated):  not done  Assets:  Communication Skills Desire for Improvement  ADL's:  Intact  Cognition: WNL  Sleep:  {BHH GOOD/FAIR/POOR:22877}   Screenings: PHQ2-9     Nut[facility]2018 [facility] [facility] Office Visit from 05/04/2018 in [facility]  PHQ-2  Total Score  4  0  0  PHQ-9 Total Score  15  -  -      Assessment and Plan:    Plan  The patient demonstrates the following risk factors for suicide: Chronic risk factors for suicide include: {Chronic Risk Factors for ZOXWRUE:45409811}Suicide:30414011}. Acute risk factors for suicide include: {Acute Risk Factors for BJYNWGN:56213086}Suicide:30414012}. Protective factors for this patient include: {Protective Factors for Suicide VHQI:69629528}Risk:30414013}. Considering these factors, the overall suicide risk at this point appears to be {Desc; low/moderate/high:110033}. Patient {ACTION; IS/IS UXL:24401027}OT:21021397} appropriate for outpatient follow up.     Neysa Hottereina Treyton Slimp, MD 8/3/202012:49 PM

## 2019-02-25 ENCOUNTER — Ambulatory Visit (HOSPITAL_COMMUNITY): Payer: Medicaid Other | Admitting: Psychiatry

## 2019-02-27 NOTE — Procedures (Signed)
  [place]    Patient Name: [name] [name], [name] [name] Date: 01/29/2019 Gender: Female D.O.B: 07/22/1986 Age (years): 34 Referring Provider: Leda Gauze NP Height (inches): 65 Interpreting Physician: Baird Lyons MD, ABSM Weight (lbs): 357 RPSGT: Peak, Robert BMI: 20 MRN: [id number] Neck Size: <br>  CLINICAL INFORMATION Sleep Study Type: HST Indication for sleep study: Morbid Obesity, Snoring Epworth Sleepiness Score: NA  SLEEP STUDY TECHNIQUE A multi-channel overnight portable sleep study was performed. The channels recorded were: nasal airflow, thoracic respiratory movement, and oxygen saturation with a pulse oximetry. Snoring was also monitored.  MEDICATIONS Patient self administered medications include: none reported.  SLEEP ARCHITECTURE Patient was studied for 736.2 minutes. The sleep efficiency was 99.3 % and the patient was supine for 62.1%. The arousal index was 0.0 per hour.  RESPIRATORY PARAMETERS The overall AHI was 2.3 per hour, with a central apnea index of 0.0 per hour. The oxygen nadir was 86% during sleep.  CARDIAC DATA Mean heart rate during sleep was 79.4 bpm.  IMPRESSIONS - No significant obstructive sleep apnea occurred during this study (AHI = 2.3/h). - No significant central sleep apnea occurred during this study (CAI = 0.0/h). - Oxygen desaturation was noted during this study (Min O2 = 86%).  Time with saturation < 90% was 0.4 minutes. - Patient snored.  DIAGNOSIS - Primary snoring  RECOMMENDATIONS - Manage as clinically appropriate for snoring and other symptoms. - Be careful with alcohol, sedatives and other CNS depressants that may worsen sleep apnea and disrupt normal sleep architecture. - Sleep hygiene should be reviewed to assess factors that may improve sleep quality. - Weight management and regular exercise should be initiated or continued.  [Electronically signed] 02/27/2019 09:43 AM  Baird Lyons  MD, ABSM Diplomate, American Board of Sleep Medicine   NPI: [id number]                          [place], [place] [facility]  ELECTRONICALLY SIGNED ON:  02/27/2019, 9:45 AM [place] PH: [phone]   FX: [phone] [place], [place] Board of Sleep Medicine  ELECTRONICALLY SIGNED ON:  02/27/2019, 9:44 AM [place] PH: [phone]   FX: [phone] [place]

## 2019-03-17 ENCOUNTER — Ambulatory Visit: Payer: PRIVATE HEALTH INSURANCE | Admitting: Orthopedic Surgery

## 2019-04-04 ENCOUNTER — Encounter (HOSPITAL_COMMUNITY): Payer: Self-pay | Admitting: Emergency Medicine

## 2019-04-04 ENCOUNTER — Other Ambulatory Visit: Payer: Self-pay

## 2019-04-04 ENCOUNTER — Emergency Department (HOSPITAL_COMMUNITY)
Admission: EM | Admit: 2019-04-04 | Discharge: 2019-04-04 | Disposition: A | Discharge status: Home / Self Care | Payer: PRIVATE HEALTH INSURANCE | Attending: Emergency Medicine | Admitting: Emergency Medicine

## 2019-04-04 ENCOUNTER — Emergency Department (HOSPITAL_COMMUNITY): Payer: PRIVATE HEALTH INSURANCE

## 2019-04-04 DIAGNOSIS — Z79899 Other long term (current) drug therapy: Secondary | ICD-10-CM | POA: Insufficient documentation

## 2019-04-04 DIAGNOSIS — R509 Fever, unspecified: Secondary | ICD-10-CM | POA: Diagnosis present

## 2019-04-04 DIAGNOSIS — Z87891 Personal history of nicotine dependence: Secondary | ICD-10-CM | POA: Insufficient documentation

## 2019-04-04 DIAGNOSIS — N12 Tubulo-interstitial nephritis, not specified as acute or chronic: Secondary | ICD-10-CM | POA: Diagnosis not present

## 2019-04-04 DIAGNOSIS — Z20828 Contact with and (suspected) exposure to other viral communicable diseases: Secondary | ICD-10-CM | POA: Insufficient documentation

## 2019-04-04 LAB — CBC WITH DIFFERENTIAL/PLATELET
Abs Immature Granulocytes: 0.05 10*3/uL (ref 0.00–0.07)
Basophils Absolute: 0 10*3/uL (ref 0.0–0.1)
Basophils Relative: 0 %
Eosinophils Absolute: 0 10*3/uL (ref 0.0–0.5)
Eosinophils Relative: 0 %
HCT: 38.2 % (ref 36.0–46.0)
Hemoglobin: 11.6 g/dL — ABNORMAL LOW (ref 12.0–15.0)
Immature Granulocytes: 0 %
Lymphocytes Relative: 10 %
Lymphs Abs: 1.3 10*3/uL (ref 0.7–4.0)
MCH: 24.8 pg — ABNORMAL LOW (ref 26.0–34.0)
MCHC: 30.4 g/dL (ref 30.0–36.0)
MCV: 81.8 fL (ref 80.0–100.0)
Monocytes Absolute: 1.4 10*3/uL — ABNORMAL HIGH (ref 0.1–1.0)
Monocytes Relative: 11 %
Neutro Abs: 10.4 10*3/uL — ABNORMAL HIGH (ref 1.7–7.7)
Neutrophils Relative %: 79 %
Platelets: 228 10*3/uL (ref 150–400)
RBC: 4.67 MIL/uL (ref 3.87–5.11)
RDW: 15.4 % (ref 11.5–15.5)
WBC: 13.2 10*3/uL — ABNORMAL HIGH (ref 4.0–10.5)
nRBC: 0 % (ref 0.0–0.2)

## 2019-04-04 LAB — URINALYSIS, ROUTINE W REFLEX MICROSCOPIC
Bacteria, UA: NONE SEEN
Bilirubin Urine: NEGATIVE
Glucose, UA: NEGATIVE mg/dL
Ketones, ur: NEGATIVE mg/dL
Nitrite: NEGATIVE
Protein, ur: 100 mg/dL — AB
Specific Gravity, Urine: 1.012 (ref 1.005–1.030)
WBC, UA: >50 WBC/hpf — ABNORMAL HIGH (ref 0–5)
pH: 7 (ref 5.0–8.0)

## 2019-04-04 LAB — COMPREHENSIVE METABOLIC PANEL
ALT: 17 U/L (ref 0–44)
AST: 17 U/L (ref 15–41)
Albumin: 3.2 g/dL — ABNORMAL LOW (ref 3.5–5.0)
Alkaline Phosphatase: 48 U/L (ref 38–126)
Anion gap: 12 (ref 5–15)
BUN: 7 mg/dL (ref 6–20)
CO2: 23 mmol/L (ref 22–32)
Calcium: 8.1 mg/dL — ABNORMAL LOW (ref 8.9–10.3)
Chloride: 100 mmol/L (ref 98–111)
Creatinine, Ser: 1.05 mg/dL — ABNORMAL HIGH (ref 0.44–1.00)
GFR calc Af Amer: >60 mL/min (ref >60)
GFR calc non Af Amer: >60 mL/min (ref >60)
Glucose, Bld: 110 mg/dL — ABNORMAL HIGH (ref 70–99)
Potassium: 2.8 mmol/L — ABNORMAL LOW (ref 3.5–5.1)
Sodium: 135 mmol/L (ref 135–145)
Total Bilirubin: 0.9 mg/dL (ref 0.3–1.2)
Total Protein: 6.6 g/dL (ref 6.5–8.1)

## 2019-04-04 LAB — SARS CORONAVIRUS 2 BY RT PCR (HOSPITAL ORDER, PERFORMED IN ~~LOC~~ HOSPITAL LAB): SARS Coronavirus 2: NEGATIVE

## 2019-04-04 LAB — MAGNESIUM: Magnesium: 1.6 mg/dL — ABNORMAL LOW (ref 1.7–2.4)

## 2019-04-04 LAB — LACTIC ACID, PLASMA: Lactic Acid, Venous: 0.8 mmol/L (ref 0.5–1.9)

## 2019-04-04 MED ORDER — SODIUM CHLORIDE 0.9 % IV SOLN
1.0000 g | Freq: Once | INTRAVENOUS | Status: AC
  Administered 2019-04-04: 21:00:00 1 g via INTRAVENOUS
  Filled 2019-04-04: qty 10

## 2019-04-04 MED ORDER — HYDROMORPHONE HCL 1 MG/ML IJ SOLN
0.5000 mg | Freq: Once | INTRAMUSCULAR | Status: AC
  Administered 2019-04-04: 0.5 mg via INTRAVENOUS
  Filled 2019-04-04: qty 1

## 2019-04-04 MED ORDER — POTASSIUM CHLORIDE CRYS ER 20 MEQ PO TBCR
40.0000 meq | EXTENDED_RELEASE_TABLET | Freq: Once | ORAL | Status: AC
  Administered 2019-04-04: 40 meq via ORAL
  Filled 2019-04-04: qty 2

## 2019-04-04 MED ORDER — ONDANSETRON HCL 4 MG/2ML IJ SOLN
4.0000 mg | Freq: Once | INTRAMUSCULAR | Status: AC
  Administered 2019-04-04: 4 mg via INTRAVENOUS
  Filled 2019-04-04: qty 2

## 2019-04-04 MED ORDER — ACETAMINOPHEN 500 MG PO TABS
1000.0000 mg | ORAL_TABLET | Freq: Once | ORAL | Status: AC
  Administered 2019-04-04: 1000 mg via ORAL
  Filled 2019-04-04: qty 2

## 2019-04-04 MED ORDER — SODIUM CHLORIDE 0.9 % IV BOLUS (SEPSIS)
1000.0000 mL | Freq: Once | INTRAVENOUS | Status: AC
  Administered 2019-04-04: 1000 mL via INTRAVENOUS

## 2019-04-04 MED ORDER — SODIUM CHLORIDE 0.9 % IV SOLN
1000.0000 mL | INTRAVENOUS | Status: DC
  Administered 2019-04-04: 1000 mL via INTRAVENOUS

## 2019-04-04 MED ORDER — CEPHALEXIN 500 MG PO CAPS: 500.0000 mg | ORAL_CAPSULE | Freq: Four times a day (QID) | ORAL | 0 refills | Status: DC

## 2019-04-04 NOTE — Discharge Instructions (Signed)
Your examination is consistent with a severe urinary tract infection called pyelonephritis please increase water, juices, Gatorade.  This is very important.  Please use Keflex with breakfast, lunch, dinner, and at bedtime until all taken.  Use Tylenol every 4 hours as needed for fever, and/or aching.  Please return to the emergency department if any temperature that will not respond to Tylenol or ibuprofen.  Return if there is vomiting and you are unable to keep your medications down, or worsening of your condition, changes in your symptoms, problems or concerns.  Please have your urine rechecked in 5 to 7 days.

## 2019-04-04 NOTE — ED Provider Notes (Signed)
**Note [name]-Identified via Obfuscation** [facility] Note   CSN: [id number] Arrival date & time: 04/04/19  1720     History   Chief Complaint Chief Complaint  Patient presents with  . Fever    HPI [name] [name] is a 33 y.o. female.     Patient is a 33 year old female who presents to the emergency department with a complaint of fever.  The patient states this problem started approximately 48 hours ago.  The patient states she has pain in her right ribs.  She has pain when she takes a deep breath.  She says that she had a temperature maximum on last evening of 103.  She has had problems with nausea and has done some vomiting.  She works for [employer], but states she is not been around anyone diagnosed with the COVID-19 virus.  She has not been doing any excessive traveling recently.  She is not a smoker.  She does not use any drugs.  She has not had any recent injury to the rib area.  No recent operations or procedures.  She presents now for assistance with these problems.  The history is provided by the patient.    Past Medical History:  Diagnosis Date  . Abnormal Pap smear   . Anxiety   . Bipolar 1 disorder ([facility])   . Depression   . IUD (intrauterine device) in place 04/12/2013   Had IUD inserted 03/29/13 could not feel strings went to ER 9/18 and KUB saw IUD still could not see string, can't see now but seen in [place]  . Kidney infection   . Pregnancy   . UTI (urinary tract infection)     Patient Active Problem List   Diagnosis Date Noted  . BMI 50.0-59.9, adult ([facility]) 11/30/2018  . Weight loss counseling, encounter for 11/30/2018  . Encounter for IUD insertion 09/01/2018  . Menorrhagia with regular cycle 05/04/2018  . Persistent headaches 08/03/2013  . Obesity, morbid, BMI 50 or higher ([facility]) 08/03/2013  . Snoring 08/03/2013  . Chlamydia 01/18/2013  . DUB (dysfunctional uterine bleeding) 01/13/2013  . Pyelonephritis 07/18/2011  . Obesity, Class III, BMI 40-49.9 (morbid  obesity) ([facility]) 07/18/2011    Past Surgical History:  Procedure Laterality Date  . CESAREAN SECTION    . TOOTH EXTRACTION       OB History    Gravida  4   Para  2   Term  1   Preterm  1   AB  2   Living  2     SAB  2   TAB  0   Ectopic  0   Multiple  0   Live Births  2            Home Medications    Prior to Admission medications   Medication Sig Start Date End Date Taking? Authorizing Provider  ARIPiprazole (ABILIFY) 10 MG tablet Take 10 mg by mouth daily. 11/16/18  Yes [provider]  cetirizine (ZYRTEC) 10 MG tablet Take 10 mg by mouth 2 (two) times daily.   Yes [provider]  Cholecalciferol (VITAMIN D) 50 MCG (2000 UT) tablet Take 2,000 Units by mouth daily.    Yes [provider]  levonorgestrel (MIRENA) 20 MCG/24HR IUD 1 each by Intrauterine route once.   Yes [provider]  montelukast (SINGULAIR) 10 MG tablet Take 10 mg by mouth daily.   Yes [provider]  traZODone (DESYREL) 50 MG tablet Take 25 mg by mouth  at bedtime.    Yes [provider]  venlafaxine XR (EFFEXOR-XR) 37.5 MG 24 hr capsule Take 37.5 mg by mouth daily with breakfast.  03/15/19  Yes [provider]  vitamin C (ASCORBIC ACID) 250 MG tablet Take 250 mg by mouth daily.   Yes [provider]    Family History Family History  Problem Relation Age of Onset  . Hypertension Father   . Diabetes Father   . Congestive Heart Failure Father   . Hypertension Paternal Grandmother   . Diabetes Paternal Grandmother   . Diabetes Paternal Grandfather   . Diabetes Paternal Aunt   . Diabetes Paternal Uncle     Social History Social History   Tobacco Use  . Smoking status: Former Smoker    Types: Cigarettes    Quit date: 05/21/2007    Years since quitting: 11.8  . Smokeless tobacco: Never Used  Substance Use Topics  . Alcohol use: No    Comment: occ.   . Drug use: No     Allergies   Shellfish allergy    Review of Systems Review of Systems  Constitutional: Positive for chills and fever. Negative for activity change and appetite change.  HENT: Negative for congestion, ear discharge, ear pain, facial swelling, nosebleeds, rhinorrhea, sneezing and tinnitus.   Eyes: Negative for photophobia, pain and discharge.  Respiratory: Negative for cough, choking, shortness of breath and wheezing.   Cardiovascular: Negative for chest pain, palpitations and leg swelling.  Gastrointestinal: Negative for abdominal pain, blood in stool, constipation, diarrhea, nausea and vomiting.  Genitourinary: Positive for flank pain. Negative for difficulty urinating, dysuria, frequency and hematuria.  Musculoskeletal: Negative for back pain, gait problem, myalgias and neck pain.  Skin: Negative for color change, rash and wound.  Neurological: Negative for dizziness, seizures, syncope, facial asymmetry, speech difficulty, weakness and numbness.  Hematological: Negative for adenopathy. Does not bruise/bleed easily.  Psychiatric/Behavioral: Negative for agitation, confusion, hallucinations, self-injury and suicidal ideas. The patient is not nervous/anxious.      Physical Exam Updated Vital Signs BP (!) 98/58 (BP Location: Right Arm)   Pulse (!) 119   Temp (!) 101.3 F (38.5 C) (Oral)   Resp 18   Ht 5\' 9"  (1.753 m)   Wt (!) 156.5 kg   LMP 03/23/2019   SpO2 95%   BMI 50.95 kg/m   Physical Exam Vitals signs and nursing note reviewed.  Constitutional:      Appearance: She is well-developed. She is not toxic-appearing.  HENT:     Head: Normocephalic.     Right Ear: Tympanic membrane and external ear normal.     Left Ear: Tympanic membrane and external ear normal.  Eyes:     General: Lids are normal.     Pupils: Pupils are equal, round, and reactive to light.  Neck:     Musculoskeletal: Normal range of motion and neck supple.     Vascular: No carotid bruit.  Cardiovascular:     Rate and Rhythm: Regular  rhythm. Tachycardia present.     Pulses: Normal pulses.     Heart sounds: Normal heart sounds.  Pulmonary:     Effort: No respiratory distress.     Breath sounds: Normal breath sounds.  Chest:     Chest wall: Tenderness present.     Comments: Right rib tenderness.  Symmetrical rise and fall of the chest.  Patient speaks in complete sentences. Abdominal:     General: Bowel sounds are normal.     Palpations:  Abdomen is soft.     Tenderness: There is abdominal tenderness in the right upper quadrant and epigastric area. There is right CVA tenderness. There is no guarding.  Musculoskeletal: Normal range of motion.  Lymphadenopathy:     Head:     Right side of head: No submandibular adenopathy.     Left side of head: No submandibular adenopathy.     Cervical: No cervical adenopathy.  Skin:    General: Skin is warm and dry.  Neurological:     Mental Status: She is alert and oriented to person, place, and time.     Cranial Nerves: No cranial nerve deficit.     Sensory: No sensory deficit.  Psychiatric:        Speech: Speech normal.      ED Treatments / Results  Labs (all labs ordered are listed, but only abnormal results are displayed) Labs Reviewed  SARS CORONAVIRUS 2 ([facility])  CBC WITH DIFFERENTIAL/PLATELET  COMPREHENSIVE METABOLIC PANEL  LACTIC ACID, PLASMA  LACTIC ACID, PLASMA    EKG None  Radiology No results found.  Procedures Procedures (including critical care time)  Medications Ordered in ED Medications - No data to display   Initial Impression / Assessment and Plan / ED Course  I have reviewed the triage vital signs and the nursing notes.  Pertinent labs & imaging results that were available during my care of the patient were reviewed by me and considered in my medical decision making (see chart for details).          Final Clinical Impressions(s) / ED Diagnoses MDM  Temperature on admission is  101.3.  Pulse rate elevated at 119.  Blood pressure was 88/62.  IV fluids were started.  Cultures were obtained room.  Pulse oximetry is 93 to 95% on room air.  COVID-19 virus test obtained and found to be negative.  White blood cell count is elevated at 13,200.  There is no shift to the left.  The potassium is low at 2.8.  40 mEq of oral potassium has been given. Urine analysis shows a cloudy yellow specimen with a small hemoglobin, large leukocyte esterase, 21-50 red blood cells and greater than 50 white blood cells present.  All white blood cells are present in clumps.  A culture has been sent to the lab.  Patient started on IV Rocephin.  Chest x-ray shows low volume, but otherwise negative.  Patient given additional IV fluids at the time of discharge pressure improved 110/78.  Patient ambulatory without problem.  Pulse oximetry is 99% on room air.  Patient will be treated with Keflex 4 times daily.  Patient use Tylenol every 4 hours for fever or achiness.  Patient is to have the urine rechecked in 5 to 7 days.  She is to return to the emergency department if any worsening of her symptoms, changes in her condition, problems, or concerns.   Final diagnoses:  Pyelonephritis    ED Discharge Orders         Ordered    cephALEXin (KEFLEX) 500 MG capsule  4 times daily     04/04/19 2329           Lily Kocher, PA-C 04/04/19 2339    Milton Ferguson, MD 04/08/19 1204

## 2019-04-04 NOTE — ED Notes (Signed)
Due to ? Girth, pt IV is positional and infusing slowly as pt moves arm

## 2019-04-04 NOTE — ED Notes (Signed)
HB in to reassess

## 2019-04-04 NOTE — ED Triage Notes (Signed)
Fever since last night   Has taken no meds for relief   Here for eval

## 2019-04-04 NOTE — ED Notes (Signed)
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

## 2019-04-10 LAB — CULTURE, BLOOD (ROUTINE X 2)
Culture: NO GROWTH
Culture: NO GROWTH
Special Requests: ADEQUATE
Special Requests: ADEQUATE

## 2019-04-14 ENCOUNTER — Encounter: Payer: PRIVATE HEALTH INSURANCE | Attending: Family | Admitting: Nutrition

## 2019-04-14 ENCOUNTER — Encounter: Payer: Self-pay | Admitting: Nutrition

## 2019-04-14 ENCOUNTER — Ambulatory Visit: Payer: PRIVATE HEALTH INSURANCE | Admitting: Orthopedic Surgery

## 2019-04-14 ENCOUNTER — Other Ambulatory Visit: Payer: Self-pay

## 2019-04-14 VITALS — Ht 65.0 in | Wt 325.0 lb

## 2019-04-14 DIAGNOSIS — R7301 Impaired fasting glucose: Secondary | ICD-10-CM | POA: Insufficient documentation

## 2019-04-14 NOTE — Progress Notes (Signed)
Telephone visit. Medical Nutrition Therapy:  Appt start time: 0800 end time: 0830   Assessment:  Primary concerns today:  Morbid Obesity. PreDM Changes made. Lost 20 lbs.  Working on Drinking more water.  Working on [employer].  Going to therapy for her emotional eating.  Trying to work on her anger and using her social support system.  Hasn't used MYfitness pal yet. Feels good about her weight loss and wants to continue to lose weight. Therapy has helped a lot with her emotional eating.  Preferred Learning Style:   No preference indicated   Learning Readiness:   Contemplating    MEDICATIONS:    DIETARY INTAKE:    24-hr recall:  B  Ham and cheese wrap, water  Snk ( AM):  L ( PM):  Pasta , shrimp,with stirfy, peach, applesauce Snk ( PM): D ( PM):  Lasagna vegetable,  Water   Snk ( PM): candy 2 Drinking: water, Usual physical activity: ADL  Estimated energy needs: 1200 calories 135 g carbohydrates 90 g protein 33 g fat  Progress Towards Goal(s):  In progress.   Nutritional Diagnosis:  NB-1.1 Food and nutrition-related knowledge deficit As related to Obesity.  As evidenced by BMI > 50.    Intervention:  Nutrition and Pre Diabetes education provided on My Plate, CHO counting, meal planning, portion sizes, timing of meals, avoiding snacks between meals,  taking medications as prescribed, benefits of exercising 30 minutes per day and prevention  of DM.  Goals Walking 30 minutes a day. Drinking more water Cut out snacking.  Lose 1-2 lbs per week.   Teaching Method Utilized:  Visual Auditory  Handouts given during visit include:  Verbally went over My Plate   Barriers to learning/adherence to lifestyle change: none  Demonstrated degree of understanding via:  Teach Back   Monitoring/Evaluation:  Dietary intake, exercise, , and body weight in 3 month(s). Suggest checking an A1C if not done recently to rule out DM Type 2.

## 2019-04-14 NOTE — Patient Instructions (Addendum)
Goals Walking 30 minutes a day. Drinking more water Cut out snacking.  Lose 1-2 lbs per week.

## 2019-04-26 ENCOUNTER — Encounter: Payer: Self-pay | Admitting: Orthopedic Surgery

## 2019-04-26 ENCOUNTER — Ambulatory Visit: Payer: PRIVATE HEALTH INSURANCE | Admitting: Orthopedic Surgery

## 2019-07-20 ENCOUNTER — Ambulatory Visit: Payer: PRIVATE HEALTH INSURANCE | Admitting: Nutrition

## 2019-08-23 ENCOUNTER — Encounter: Payer: Self-pay | Admitting: Internal Medicine

## 2019-09-06 ENCOUNTER — Ambulatory Visit: Payer: PRIVATE HEALTH INSURANCE | Admitting: Nutrition

## 2019-09-08 ENCOUNTER — Ambulatory Visit: Payer: Medicaid Other | Admitting: Gastroenterology

## 2019-09-23 ENCOUNTER — Ambulatory Visit: Payer: Medicaid Other | Admitting: Gastroenterology

## 2019-09-25 NOTE — Progress Notes (Deleted)
Referring Provider: Lucius Conn, NP Primary Care Physician:  Gennette Pac, NP Primary Gastroenterologist:  Dr. Jena Gauss  No chief complaint on file.   HPI:   [name] [name] is a 34 y.o. female presenting today at the request of Dekoninck, Beth A, NP for diarrhea.   Today:   Past Medical History:  Diagnosis Date  . Abnormal Pap smear   . Anxiety   . Bipolar 1 disorder (HCC)   . Depression   . IUD (intrauterine device) in place 04/12/2013   Had IUD inserted 03/29/13 could not feel strings went to ER 9/18 and KUB saw IUD still could not see string, can't see now but seen in [place]  . Kidney infection   . Pregnancy   . UTI (urinary tract infection)     Past Surgical History:  Procedure Laterality Date  . CESAREAN SECTION    . TOOTH EXTRACTION      Current Outpatient Medications  Medication Sig Dispense Refill  . ARIPiprazole (ABILIFY) 10 MG tablet Take 10 mg by mouth daily.    . cephALEXin (KEFLEX) 500 MG capsule Take 1 capsule (500 mg total) by mouth 4 (four) times daily. 20 capsule 0  . cetirizine (ZYRTEC) 10 MG tablet Take 10 mg by mouth 2 (two) times daily.    . Cholecalciferol (VITAMIN D) 50 MCG (2000 UT) tablet Take 2,000 Units by mouth daily.     Marland Kitchen levonorgestrel (MIRENA) 20 MCG/24HR IUD 1 each by Intrauterine route once.    . montelukast (SINGULAIR) 10 MG tablet Take 10 mg by mouth daily.    . traZODone (DESYREL) 50 MG tablet Take 25 mg by mouth at bedtime.     Marland Kitchen venlafaxine XR (EFFEXOR-XR) 37.5 MG 24 hr capsule Take 37.5 mg by mouth daily with breakfast.     . vitamin C (ASCORBIC ACID) 250 MG tablet Take 250 mg by mouth daily.     No current facility-administered medications for this visit.    Allergies as of 09/27/2019 - Review Complete 04/14/2019  Allergen Reaction Noted  . Shellfish allergy Anaphylaxis 05/21/2011    Family History  Problem Relation Age of Onset  . Hypertension Father   . Diabetes Father   . Congestive Heart Failure Father   .  Hypertension Paternal Grandmother   . Diabetes Paternal Grandmother   . Diabetes Paternal Grandfather   . Diabetes Paternal Aunt   . Diabetes Paternal Uncle     Social History   Socioeconomic History  . Marital status: Married    Spouse name: Not on file  . Number of children: 2  . Years of education: Not on file  . Highest education level: Not on file  Occupational History  . Not on file  Tobacco Use  . Smoking status: Former Smoker    Types: Cigarettes    Quit date: 05/21/2007    Years since quitting: 12.3  . Smokeless tobacco: Never Used  Substance and Sexual Activity  . Alcohol use: No    Comment: occ.   . Drug use: No  . Sexual activity: Not Currently    Partners: Male    Birth control/protection: I.U.D.  Other Topics Concern  . Not on file  Social History Narrative  . Not on file   Social Determinants of Health   Financial Resource Strain:   . Difficulty of Paying Living Expenses: Not on file  Food Insecurity:   . Worried About Programme researcher, broadcasting/film/video in the Last Year: Not on file  .  Ran Out of Food in the Last Year: Not on file  Transportation Needs:   . Lack of Transportation (Medical): Not on file  . Lack of Transportation (Non-Medical): Not on file  Physical Activity:   . Days of Exercise per Week: Not on file  . Minutes of Exercise per Session: Not on file  Stress:   . Feeling of Stress : Not on file  Social Connections:   . Frequency of Communication with Friends and Family: Not on file  . Frequency of Social Gatherings with Friends and Family: Not on file  . Attends Religious Services: Not on file  . Active Member of Clubs or Organizations: Not on file  . Attends Archivist Meetings: Not on file  . Marital Status: Not on file  Intimate Partner Violence:   . Fear of Current or Ex-Partner: Not on file  . Emotionally Abused: Not on file  . Physically Abused: Not on file  . Sexually Abused: Not on file    Review of Systems: Gen: Denies  any fever, chills, fatigue, weight loss, lack of appetite.  CV: Denies chest pain, heart palpitations, peripheral edema, syncope.  Resp: Denies shortness of breath at rest or with exertion. Denies wheezing or cough.  GI: Denies dysphagia or odynophagia. Denies jaundice, hematemesis, fecal incontinence. GU : Denies urinary burning, urinary frequency, urinary hesitancy MS: Denies joint pain, muscle weakness, cramps, or limitation of movement.  Derm: Denies rash, itching, dry skin Psych: Denies depression, anxiety, memory loss, and confusion Heme: Denies bruising, bleeding, and enlarged lymph nodes.  Physical Exam: There were no vitals taken for this visit. General:   Alert and oriented. Pleasant and cooperative. Well-nourished and well-developed.  Head:  Normocephalic and atraumatic. Eyes:  Without icterus, sclera clear and conjunctiva pink.  Ears:  Normal auditory acuity. Nose:  No deformity, discharge,  or lesions. Mouth:  No deformity or lesions, oral mucosa pink.  Neck:  Supple, without mass or thyromegaly. Lungs:  Clear to auscultation bilaterally. No wheezes, rales, or rhonchi. No distress.  Heart:  S1, S2 present without murmurs appreciated.  Abdomen:  +BS, soft, non-tender and non-distended. No HSM noted. No guarding or rebound. No masses appreciated.  Rectal:  Deferred  Msk:  Symmetrical without gross deformities. Normal posture. Pulses:  Normal pulses noted. Extremities:  Without clubbing or edema. Neurologic:  Alert and  oriented x4;  grossly normal neurologically. Skin:  Intact without significant lesions or rashes. Cervical Nodes:  No significant cervical adenopathy. Psych:  Alert and cooperative. Normal mood and affect.

## 2019-09-27 ENCOUNTER — Encounter: Payer: Self-pay | Admitting: Internal Medicine

## 2019-09-27 ENCOUNTER — Ambulatory Visit: Payer: Medicaid Other | Admitting: Gastroenterology

## 2019-09-27 ENCOUNTER — Telehealth: Payer: Self-pay | Admitting: Internal Medicine

## 2019-09-27 NOTE — Telephone Encounter (Signed)
Noted  

## 2019-09-27 NOTE — Telephone Encounter (Signed)
Patient was a no show and letter sent  °

## 2019-10-20 ENCOUNTER — Ambulatory Visit: Payer: PRIVATE HEALTH INSURANCE | Admitting: Nutrition

## 2019-12-04 ENCOUNTER — Other Ambulatory Visit: Payer: Self-pay

## 2019-12-04 ENCOUNTER — Encounter (HOSPITAL_COMMUNITY): Payer: Self-pay | Admitting: Emergency Medicine

## 2019-12-04 ENCOUNTER — Emergency Department (HOSPITAL_COMMUNITY)
Admission: EM | Admit: 2019-12-04 | Discharge: 2019-12-04 | Disposition: A | Discharge status: Home / Self Care | Payer: HRSA Program | Attending: Emergency Medicine | Admitting: Emergency Medicine

## 2019-12-04 DIAGNOSIS — Z79899 Other long term (current) drug therapy: Secondary | ICD-10-CM | POA: Diagnosis not present

## 2019-12-04 DIAGNOSIS — J069 Acute upper respiratory infection, unspecified: Secondary | ICD-10-CM | POA: Diagnosis not present

## 2019-12-04 DIAGNOSIS — J029 Acute pharyngitis, unspecified: Secondary | ICD-10-CM | POA: Diagnosis present

## 2019-12-04 DIAGNOSIS — Z87891 Personal history of nicotine dependence: Secondary | ICD-10-CM | POA: Insufficient documentation

## 2019-12-04 DIAGNOSIS — U071 COVID-19: Secondary | ICD-10-CM | POA: Diagnosis not present

## 2019-12-04 DIAGNOSIS — Z20822 Contact with and (suspected) exposure to covid-19: Secondary | ICD-10-CM

## 2019-12-04 LAB — SARS CORONAVIRUS 2 BY RT PCR (HOSPITAL ORDER, PERFORMED IN ~~LOC~~ HOSPITAL LAB): SARS Coronavirus 2: POSITIVE — AB

## 2019-12-04 NOTE — ED Triage Notes (Signed)
Patient c/o cough with sore throat and fatigue x4 days. Patient states nephew tested positive for Covid yesterday. Denies any fevers.

## 2019-12-04 NOTE — ED Notes (Signed)
covid exposure from nephew who tested positive yesterday   Pt states she has had a ST for the last 4 days   She is clear voices and NAD   covid swab obtained

## 2019-12-04 NOTE — ED Provider Notes (Signed)
[facility] EMERGENCY DEPARTMENT Provider Note   CSN: [id number] Arrival date & time: 12/04/19  1154     History Chief Complaint  Patient presents with  . Sore Throat    [name] [name] is a 34 y.o. female.  [name] [name] is a 34 y.o. female with history of bipolar disorder, anxiety, and depression, who presents to the ED for evaluation after Covid exposure.  Patient reports that her nephew tested positive for Covid yesterday and she has been around the nephew multiple times.  She has experienced an intermittent cough with associated sore throat over the past few days.  Reports that she has had worsening allergies recently and initially thought this was causing her symptoms.  Reports she has had nasal congestion and postnasal drip.  Has been taking Zyrtec and Singulair for this.  Denies any fevers or chills.  Has had some fatigue and body aches but states that she got her first Covid vaccine about 4 days ago and thought this may have been due to this.  She denies any associated chest pain or shortness of breath, no nausea, vomiting or diarrhea.  No other known sick contacts.  No other aggravating or alleviating factors.        Past Medical History:  Diagnosis Date  . Abnormal Pap smear   . Anxiety   . Bipolar 1 disorder (HCC)   . Depression   . IUD (intrauterine device) in place 04/12/2013   Had IUD inserted 03/29/13 could not feel strings went to ER 9/18 and KUB saw IUD still could not see string, can't see now but seen in [place]  . Kidney infection   . Pregnancy   . UTI (urinary tract infection)     Patient Active Problem List   Diagnosis Date Noted  . BMI 50.0-59.9, adult (HCC) 11/30/2018  . Weight loss counseling, encounter for 11/30/2018  . Encounter for IUD insertion 09/01/2018  . Menorrhagia with regular cycle 05/04/2018  . Persistent headaches 08/03/2013  . Obesity, morbid, BMI 50 or higher (HCC) 08/03/2013  . Snoring 08/03/2013  . Chlamydia 01/18/2013  . DUB  (dysfunctional uterine bleeding) 01/13/2013  . Pyelonephritis 07/18/2011  . Obesity, Class III, BMI 40-49.9 (morbid obesity) (HCC) 07/18/2011    Past Surgical History:  Procedure Laterality Date  . CESAREAN SECTION    . TOOTH EXTRACTION       OB History    Gravida  4   Para  2   Term  1   Preterm  1   AB  2   Living  2     SAB  2   TAB  0   Ectopic  0   Multiple  0   Live Births  2           Family History  Problem Relation Age of Onset  . Hypertension Father   . Diabetes Father   . Congestive Heart Failure Father   . Hypertension Paternal Grandmother   . Diabetes Paternal Grandmother   . Diabetes Paternal Grandfather   . Diabetes Paternal Aunt   . Diabetes Paternal Uncle     Social History   Tobacco Use  . Smoking status: Former Smoker    Types: Cigarettes    Quit date: 05/21/2007    Years since quitting: 12.5  . Smokeless tobacco: Never Used  Substance Use Topics  . Alcohol use: No  . Drug use: No    Home Medications Prior to Admission medications   Medication Sig  Start Date End Date Taking? Authorizing Provider  ARIPiprazole (ABILIFY) 10 MG tablet Take 10 mg by mouth daily. 11/16/18   [provider]  cephALEXin (KEFLEX) 500 MG capsule Take 1 capsule (500 mg total) by mouth 4 (four) times daily. 04/04/19   Lily Kocher, PA-C  cetirizine (ZYRTEC) 10 MG tablet Take 10 mg by mouth 2 (two) times daily.    [provider]  Cholecalciferol (VITAMIN D) 50 MCG (2000 UT) tablet Take 2,000 Units by mouth daily.     [provider]  levonorgestrel (MIRENA) 20 MCG/24HR IUD 1 each by Intrauterine route once.    [provider]  montelukast (SINGULAIR) 10 MG tablet Take 10 mg by mouth daily.    [provider]  traZODone (DESYREL) 50 MG tablet Take 25 mg by mouth at bedtime.     [provider]  venlafaxine XR (EFFEXOR-XR) 37.5 MG 24 hr capsule Take 37.5 mg by mouth daily with breakfast.  03/15/19    [provider]  vitamin C (ASCORBIC ACID) 250 MG tablet Take 250 mg by mouth daily.    [provider]    Allergies    Shellfish allergy  Review of Systems   Review of Systems  Constitutional: Positive for fatigue. Negative for chills and fever.  HENT: Positive for congestion, rhinorrhea and sore throat.   Respiratory: Positive for cough. Negative for shortness of breath.   Cardiovascular: Negative for chest pain.  Gastrointestinal: Negative for abdominal pain, diarrhea, nausea and vomiting.  Genitourinary: Negative for dysuria and frequency.  Musculoskeletal: Negative for arthralgias and myalgias.  Skin: Negative for color change and rash.  Neurological: Negative for headaches.  All other systems reviewed and are negative.   Physical Exam Updated Vital Signs BP 125/87 (BP Location: Right Wrist)   Pulse 97   Temp 98.2 F (36.8 C) (Oral)   Resp 18   Ht 5\' 4"  (1.626 m)   Wt (!) 157.9 kg   SpO2 100%   BMI 59.77 kg/m   Physical Exam Vitals and nursing note reviewed.  Constitutional:      General: She is not in acute distress.    Appearance: She is well-developed. She is obese. She is not ill-appearing or diaphoretic.  HENT:     Head: Normocephalic and atraumatic.     Nose: Congestion and rhinorrhea present.     Mouth/Throat:     Mouth: Mucous membranes are moist.     Pharynx: Oropharynx is clear. No pharyngeal swelling or oropharyngeal exudate.     Comments: Posterior oropharynx clear and mucous membranes moist, there is mild erythema but no edema or tonsillar exudates, uvula midline, normal phonation, no trismus, tolerating secretions without difficulty. Eyes:     General:        Right eye: No discharge.        Left eye: No discharge.  Neck:     Comments: No rigidity Cardiovascular:     Rate and Rhythm: Normal rate and regular rhythm.     Heart sounds: Normal heart sounds.  Pulmonary:     Effort: Pulmonary effort is normal. No respiratory  distress.     Breath sounds: Normal breath sounds.     Comments: Respirations equal and unlabored, patient able to speak in full sentences, lungs clear to auscultation bilaterally Abdominal:     General: Bowel sounds are normal. There is no distension.     Palpations: Abdomen is soft. There is no mass.     Tenderness: There is no  abdominal tenderness. There is no guarding.     Comments: Abdomen soft, nondistended, nontender to palpation in all quadrants without guarding or peritoneal signs  Musculoskeletal:        General: No deformity.     Cervical back: Neck supple.  Lymphadenopathy:     Cervical: No cervical adenopathy.  Skin:    General: Skin is warm and dry.     Capillary Refill: Capillary refill takes less than 2 seconds.  Neurological:     Mental Status: She is alert.  Psychiatric:        Mood and Affect: Mood normal.        Behavior: Behavior normal.     ED Results / Procedures / Treatments   Labs (all labs ordered are listed, but only abnormal results are displayed) Labs Reviewed  SARS CORONAVIRUS 2 BY RT PCR (HOSPITAL ORDER, PERFORMED IN [facility])    EKG None  Radiology No results found.  Procedures Procedures (including critical care time)  Medications Ordered in ED Medications - No data to display  ED Course  I have reviewed the triage vital signs and the nursing notes.  Pertinent labs & imaging results that were available during my care of the patient were reviewed by me and considered in my medical decision making (see chart for details).    MDM Rules/Calculators/A&P                       Pt presents with nasal congestion and cough. Pt is well appearing and vitals are normal. Lungs CTA on exam.  Patients symptoms are consistent with URI versus allergic rhinitis, had recent COVID exposure, will send COVID testing. Discussed that antibiotics are not indicated for viral infections. Pt will be discharged with symptomatic treatment.   Verbalizes understanding and is agreeable with plan. Pt is hemodynamically stable & in NAD prior to dc. Return precautions discussed, pt expresses understanding and agrees with plan.   [name] [name] was evaluated in Emergency Department on 12/04/2019 for the symptoms described in the history of present illness. She was evaluated in the context of the global COVID-19 pandemic, which necessitated consideration that the patient might be at risk for infection with the SARS-CoV-2 virus that causes COVID-19. Institutional protocols and algorithms that pertain to the evaluation of patients at risk for COVID-19 are in a state of rapid change based on information released by regulatory bodies including the CDC and federal and state organizations. These policies and algorithms were followed during the patient's care in the ED.  Final Clinical Impression(s) / ED Diagnoses Final diagnoses:  URI with cough and congestion  Encounter for laboratory testing for COVID-19 virus    Rx / DC Orders ED Discharge Orders    None       Legrand Rams 12/04/19 1257    Vanetta Mulders, MD 12/05/19 [phone]

## 2019-12-04 NOTE — Discharge Instructions (Signed)
You have a Covid test pending and you should quarantine at home until you receive your results, these should be back in about 1 to 2 days, you will receive a phone call if results are positive, you can view negative results online through MyChart.  Please follow the instructions on your paperwork today to set up your MyChart account.

## 2019-12-04 NOTE — ED Notes (Signed)
Pt called and informed of positive Covid result. Instructed to quarantine for 10 days from start of symptoms. Pt verbalized understanding.

## 2019-12-05 ENCOUNTER — Encounter: Payer: Self-pay | Admitting: Physician Assistant

## 2019-12-05 ENCOUNTER — Telehealth: Payer: Self-pay | Admitting: Nurse Practitioner

## 2019-12-05 ENCOUNTER — Telehealth: Payer: Self-pay | Admitting: Physician Assistant

## 2019-12-05 NOTE — Telephone Encounter (Signed)
Called to discuss with De Hollingshead about Covid symptoms and the use of bamlanivimab combination, a monoclonal antibody infusion for those with mild to moderate Covid symptoms and at a high risk of hospitalization.     Pt is qualified for this infusion at the [facility] infusion center due to co-morbid conditions and/or a member of an at-risk group BMI >35.   Unable to reach. Voicemail left.    Patient Active Problem List   Diagnosis Date Noted  . Morbid obesity (HCC)   . BMI 50.0-59.9, adult (HCC) 11/30/2018  . Weight loss counseling, encounter for 11/30/2018  . Encounter for IUD insertion 09/01/2018  . Menorrhagia with regular cycle 05/04/2018  . Persistent headaches 08/03/2013  . Obesity, morbid, BMI 50 or higher (HCC) 08/03/2013  . Snoring 08/03/2013  . Chlamydia 01/18/2013  . DUB (dysfunctional uterine bleeding) 01/13/2013  . Pyelonephritis 07/18/2011  . Obesity, Class III, BMI 40-49.9 (morbid obesity) (HCC) 07/18/2011    Willette Alma, AGPCNP-BC Pager: [phone] Amion: N. Cousar

## 2019-12-05 NOTE — Telephone Encounter (Signed)
Called to discuss with patient about Covid symptoms and the use of bamlanivimab/etesevimab or casirivimab/imdevimab, a monoclonal antibody infusion for those with mild to moderate Covid symptoms and at a high risk of hospitalization.  Pt is qualified for this infusion at the [facility] infusion center due to BMI>35   Message left to call back  Cline Crock PA-C  [facility]

## 2019-12-06 ENCOUNTER — Telehealth (HOSPITAL_COMMUNITY): Payer: Self-pay

## 2020-05-24 ENCOUNTER — Other Ambulatory Visit: Payer: Self-pay

## 2020-05-24 ENCOUNTER — Emergency Department (HOSPITAL_COMMUNITY)
Admission: EM | Admit: 2020-05-24 | Discharge: 2020-05-24 | Disposition: A | Discharge status: Home / Self Care | Payer: PRIVATE HEALTH INSURANCE | Attending: Emergency Medicine | Admitting: Emergency Medicine

## 2020-05-24 ENCOUNTER — Ambulatory Visit: Payer: Self-pay | Admitting: *Deleted

## 2020-05-24 ENCOUNTER — Encounter (HOSPITAL_COMMUNITY): Payer: Self-pay

## 2020-05-24 DIAGNOSIS — Z87891 Personal history of nicotine dependence: Secondary | ICD-10-CM | POA: Insufficient documentation

## 2020-05-24 DIAGNOSIS — Z20822 Contact with and (suspected) exposure to covid-19: Secondary | ICD-10-CM | POA: Insufficient documentation

## 2020-05-24 DIAGNOSIS — Z79899 Other long term (current) drug therapy: Secondary | ICD-10-CM | POA: Insufficient documentation

## 2020-05-24 DIAGNOSIS — J069 Acute upper respiratory infection, unspecified: Secondary | ICD-10-CM | POA: Insufficient documentation

## 2020-05-24 LAB — RESPIRATORY PANEL BY RT PCR (FLU A&B, COVID)
Influenza A by PCR: NEGATIVE
Influenza B by PCR: NEGATIVE
SARS Coronavirus 2 by RT PCR: NEGATIVE

## 2020-05-24 NOTE — Discharge Instructions (Signed)
Your covid test is pending 

## 2020-05-24 NOTE — ED Triage Notes (Signed)
Pt reports cough x 4 or 5 days.  Denies any fever or sob.

## 2020-05-24 NOTE — Telephone Encounter (Signed)
Patient called for covid test results. covid test results negative, patient verbalized understanding .

## 2020-05-24 NOTE — ED Provider Notes (Signed)
[facility] EMERGENCY DEPARTMENT Provider Note   CSN: [id number] Arrival date & time: 05/24/20  1514     History Chief Complaint  Patient presents with  . Cough    [name] [name] is a 34 y.o. female.  The history is provided by the patient. No language interpreter was used.  Cough Cough characteristics:  Productive Sputum characteristics:  Nondescript Severity:  Moderate Onset quality:  Gradual Timing:  Constant Progression:  Worsening Chronicity:  New Smoker: no   Relieved by:  Nothing Worsened by:  Nothing Ineffective treatments:  None tried Associated symptoms: no fever, no shortness of breath and no sinus congestion        Past Medical History:  Diagnosis Date  . Abnormal Pap smear   . Anxiety   . Bipolar 1 disorder (HCC)   . Depression   . IUD (intrauterine device) in place 04/12/2013   Had IUD inserted 03/29/13 could not feel strings went to ER 9/18 and KUB saw IUD still could not see string, can't see now but seen in [place]  . Kidney infection   . Morbid obesity (HCC)   . Pregnancy   . UTI (urinary tract infection)     Patient Active Problem List   Diagnosis Date Noted  . Morbid obesity (HCC)   . BMI 50.0-59.9, adult (HCC) 11/30/2018  . Weight loss counseling, encounter for 11/30/2018  . Encounter for IUD insertion 09/01/2018  . Menorrhagia with regular cycle 05/04/2018  . Persistent headaches 08/03/2013  . Obesity, morbid, BMI 50 or higher (HCC) 08/03/2013  . Snoring 08/03/2013  . Chlamydia 01/18/2013  . DUB (dysfunctional uterine bleeding) 01/13/2013  . Pyelonephritis 07/18/2011  . Obesity, Class III, BMI 40-49.9 (morbid obesity) (HCC) 07/18/2011    Past Surgical History:  Procedure Laterality Date  . CESAREAN SECTION    . TOOTH EXTRACTION       OB History    Gravida  4   Para  2   Term  1   Preterm  1   AB  2   Living  2     SAB  2   TAB  0   Ectopic  0   Multiple  0   Live Births  2           Family History    Problem Relation Age of Onset  . Hypertension Father   . Diabetes Father   . Congestive Heart Failure Father   . Hypertension Paternal Grandmother   . Diabetes Paternal Grandmother   . Diabetes Paternal Grandfather   . Diabetes Paternal Aunt   . Diabetes Paternal Uncle     Social History   Tobacco Use  . Smoking status: Former Smoker    Types: Cigarettes    Quit date: 05/21/2007    Years since quitting: 13.0  . Smokeless tobacco: Never Used  Vaping Use  . Vaping Use: Never used  Substance Use Topics  . Alcohol use: No  . Drug use: No    Home Medications Prior to Admission medications   Medication Sig Start Date End Date Taking? Authorizing Provider  ARIPiprazole (ABILIFY) 10 MG tablet Take 10 mg by mouth daily. 11/16/18   [provider]  cephALEXin (KEFLEX) 500 MG capsule Take 1 capsule (500 mg total) by mouth 4 (four) times daily. 04/04/19   Ivery Quale, PA-C  cetirizine (ZYRTEC) 10 MG tablet Take 10 mg by mouth 2 (two) times daily.    [provider]  Cholecalciferol (VITAMIN D) 50  MCG (2000 UT) tablet Take 2,000 Units by mouth daily.     [provider]  levonorgestrel (MIRENA) 20 MCG/24HR IUD 1 each by Intrauterine route once.    [provider]  montelukast (SINGULAIR) 10 MG tablet Take 10 mg by mouth daily.    [provider]  traZODone (DESYREL) 50 MG tablet Take 25 mg by mouth at bedtime.     [provider]  venlafaxine XR (EFFEXOR-XR) 37.5 MG 24 hr capsule Take 37.5 mg by mouth daily with breakfast.  03/15/19   [provider]  vitamin C (ASCORBIC ACID) 250 MG tablet Take 250 mg by mouth daily.    [provider]    Allergies    Shellfish allergy  Review of Systems   Review of Systems  Constitutional: Negative for fever.  Respiratory: Positive for cough. Negative for shortness of breath.   All other systems reviewed and are negative.   Physical Exam Updated Vital Signs BP (!)  141/84 (BP Location: Left Wrist)   Pulse 91   Temp 98.4 F (36.9 C) (Oral)   Resp 20   Ht 5\' 1"  (1.549 m)   Wt (!) 163.3 kg   SpO2 100%   BMI 68.02 kg/m   Physical Exam Vitals and nursing note reviewed.  Constitutional:      Appearance: She is well-developed.  HENT:     Head: Normocephalic.  Cardiovascular:     Rate and Rhythm: Normal rate.  Pulmonary:     Effort: Pulmonary effort is normal.  Abdominal:     General: There is no distension.  Musculoskeletal:        General: Normal range of motion.     Cervical back: Normal range of motion.  Skin:    General: Skin is warm.  Neurological:     General: No focal deficit present.     Mental Status: She is alert and oriented to person, place, and time.  Psychiatric:        Mood and Affect: Mood normal.     ED Results / Procedures / Treatments   Labs (all labs ordered are listed, but only abnormal results are displayed) Labs Reviewed  RESPIRATORY PANEL BY RT PCR (FLU A&B, COVID)    EKG None  Radiology No results found.  Procedures Procedures (including critical care time)  Medications Ordered in ED Medications - No data to display  ED Course  I have reviewed the triage vital signs and the nursing notes.  Pertinent labs & imaging results that were available during my care of the patient were reviewed by me and considered in my medical decision making (see chart for details).    MDM Rules/Calculators/A&P                          Covid pending  Final Clinical Impression(s) / ED Diagnoses Final diagnoses:  Viral URI with cough    Rx / DC Orders ED Discharge Orders    None    An After Visit Summary was printed and given to the patient.    , Elson Areas 05/24/20 1616    13/03/21, MD 05/24/20 13/03/21

## 2020-12-14 ENCOUNTER — Emergency Department (HOSPITAL_COMMUNITY): Payer: Self-pay

## 2020-12-14 ENCOUNTER — Emergency Department (HOSPITAL_COMMUNITY)
Admission: EM | Admit: 2020-12-14 | Discharge: 2020-12-14 | Disposition: A | Discharge status: Home / Self Care | Payer: Self-pay | Attending: Emergency Medicine | Admitting: Emergency Medicine

## 2020-12-14 ENCOUNTER — Other Ambulatory Visit: Payer: Self-pay

## 2020-12-14 ENCOUNTER — Encounter (HOSPITAL_COMMUNITY): Payer: Self-pay | Admitting: *Deleted

## 2020-12-14 DIAGNOSIS — Z202 Contact with and (suspected) exposure to infections with a predominantly sexual mode of transmission: Secondary | ICD-10-CM | POA: Insufficient documentation

## 2020-12-14 DIAGNOSIS — R059 Cough, unspecified: Secondary | ICD-10-CM | POA: Insufficient documentation

## 2020-12-14 DIAGNOSIS — R110 Nausea: Secondary | ICD-10-CM

## 2020-12-14 DIAGNOSIS — Z87891 Personal history of nicotine dependence: Secondary | ICD-10-CM | POA: Insufficient documentation

## 2020-12-14 LAB — PREGNANCY, URINE: Preg Test, Ur: NEGATIVE

## 2020-12-14 MED ORDER — ONDANSETRON 8 MG PO TBDP
8.0000 mg | ORAL_TABLET | Freq: Once | ORAL | Status: AC
  Administered 2020-12-14: 8 mg via ORAL
  Filled 2020-12-14: qty 1

## 2020-12-14 NOTE — Discharge Instructions (Signed)
It was our pleasure to provide your ER care today - we hope that you feel better.  Your chest xray looks good.  You may try mucinex, nyquil or other OTC cough medication as need. Occasionally reflux can cause recurrent intermittent coughing - you may try taking pepcid and maalox as need, especially prior to going to bed to see if that helps.   Your other test/blood work is pending and should be resulted in the next day - you may check MyChart for results.   Follow up with primary care doctor in 1-2 weeks.  Return to ER if worse, new symptoms, high fevers, increased trouble breathing, persistent vomiting, or other concern.

## 2020-12-14 NOTE — ED Triage Notes (Signed)
Pt c/o cough, n/v x 3 weeks. Denies diarrhea, fever.   Pt also reports she needs a pregnancy and Herpes test. Her partner's other partner was told yesterday she had Herpes. Pt has an IUD in place.

## 2020-12-14 NOTE — ED Provider Notes (Addendum)
[facility] EMERGENCY DEPARTMENT Provider Note   CSN: [id number] Arrival date & time: 12/14/20  0745     History Chief Complaint  Patient presents with  . Cough  . Exposure to STD    [name] [name] is a 35 y.o. female.  Patient c/o non productive cough and intermittent nausea/vomiting in past few weeks. Symptoms acute onset, episodic, recurrent/persistent, mild-moderate. No acute or abrupt worsening today. States saw pcp w same - no specific diagnosis then. Indicates already takes meds for reflux and allergies but symptoms persist. No sore throat or runny nose. No specific known ill contacts. No sob. No chest pain. No abd pain. Normal appetite. No distension. No diarrhea or constipation. Emesis clear or color recently ingested food/fluids - no bloody or bilious emesis. No dysuria or gu c/o. No vaginal discharge or bleeding. Has IUD.   The history is provided by the patient.  Cough Associated symptoms: no chest pain, no chills, no fever, no headaches, no rash, no shortness of breath and no sore throat   Exposure to STD Pertinent negatives include no chest pain, no abdominal pain, no headaches and no shortness of breath.       Past Medical History:  Diagnosis Date  . Abnormal Pap smear   . Anxiety   . Bipolar 1 disorder (HCC)   . Depression   . IUD (intrauterine device) in place 04/12/2013   Had IUD inserted 03/29/13 could not feel strings went to ER 9/18 and KUB saw IUD still could not see string, can't see now but seen in [place]  . Kidney infection   . Morbid obesity (HCC)   . Pregnancy   . UTI (urinary tract infection)     Patient Active Problem List   Diagnosis Date Noted  . Morbid obesity (HCC)   . BMI 50.0-59.9, adult (HCC) 11/30/2018  . Weight loss counseling, encounter for 11/30/2018  . Encounter for IUD insertion 09/01/2018  . Menorrhagia with regular cycle 05/04/2018  . Persistent headaches 08/03/2013  . Obesity, morbid, BMI 50 or higher (HCC) 08/03/2013  .  Snoring 08/03/2013  . Chlamydia 01/18/2013  . DUB (dysfunctional uterine bleeding) 01/13/2013  . Pyelonephritis 07/18/2011  . Obesity, Class III, BMI 40-49.9 (morbid obesity) (HCC) 07/18/2011    Past Surgical History:  Procedure Laterality Date  . CESAREAN SECTION    . TOOTH EXTRACTION       OB History    Gravida  4   Para  2   Term  1   Preterm  1   AB  2   Living  2     SAB  2   IAB  0   Ectopic  0   Multiple  0   Live Births  2           Family History  Problem Relation Age of Onset  . Hypertension Father   . Diabetes Father   . Congestive Heart Failure Father   . Hypertension Paternal Grandmother   . Diabetes Paternal Grandmother   . Diabetes Paternal Grandfather   . Diabetes Paternal Aunt   . Diabetes Paternal Uncle     Social History   Tobacco Use  . Smoking status: Former Smoker    Types: Cigarettes    Quit date: 05/21/2007    Years since quitting: 13.5  . Smokeless tobacco: Never Used  Vaping Use  . Vaping Use: Never used  Substance Use Topics  . Alcohol use: No  . Drug use: Yes  Types: Marijuana    Home Medications Prior to Admission medications   Medication Sig Start Date End Date Taking? Authorizing Provider  ARIPiprazole (ABILIFY) 10 MG tablet Take 10 mg by mouth daily. 11/16/18   [provider]  cephALEXin (KEFLEX) 500 MG capsule Take 1 capsule (500 mg total) by mouth 4 (four) times daily. 04/04/19   Ivery Quale, PA-C  cetirizine (ZYRTEC) 10 MG tablet Take 10 mg by mouth 2 (two) times daily.    [provider]  Cholecalciferol (VITAMIN D) 50 MCG (2000 UT) tablet Take 2,000 Units by mouth daily.     [provider]  levonorgestrel (MIRENA) 20 MCG/24HR IUD 1 each by Intrauterine route once.    [provider]  montelukast (SINGULAIR) 10 MG tablet Take 10 mg by mouth daily.    [provider]  traZODone (DESYREL) 50 MG tablet Take 25 mg by mouth at bedtime.     [provider]  venlafaxine XR (EFFEXOR-XR) 37.5 MG 24 hr capsule Take 37.5 mg by mouth daily with breakfast.  03/15/19   [provider]  vitamin C (ASCORBIC ACID) 250 MG tablet Take 250 mg by mouth daily.    [provider]    Allergies    Shellfish allergy  Review of Systems   Review of Systems  Constitutional: Negative for chills and fever.  HENT: Negative for sore throat.   Eyes: Negative for redness.  Respiratory: Positive for cough. Negative for shortness of breath.   Cardiovascular: Negative for chest pain.  Gastrointestinal: Negative for abdominal pain, constipation and diarrhea.  Genitourinary: Negative for dysuria, flank pain, vaginal bleeding and vaginal discharge.  Musculoskeletal: Negative for back pain and neck pain.  Skin: Negative for rash.  Neurological: Negative for headaches.  Hematological: Does not bruise/bleed easily.  Psychiatric/Behavioral: Negative for confusion.    Physical Exam Updated Vital Signs BP 130/87 (BP Location: Left Wrist)   Pulse 88   Temp 98.6 F (37 C) (Oral)   Resp 16   Ht 1.651 m (5\' 5" )   Wt (!) 140.6 kg   SpO2 98%   BMI 51.58 kg/m   Physical Exam Vitals and nursing note reviewed.  Constitutional:      Appearance: Normal appearance. She is well-developed.  HENT:     Head: Atraumatic.     Nose: Nose normal.     Mouth/Throat:     Mouth: Mucous membranes are moist.     Pharynx: Oropharynx is clear. No oropharyngeal exudate or posterior oropharyngeal erythema.  Eyes:     General: No scleral icterus.    Conjunctiva/sclera: Conjunctivae normal.  Neck:     Trachea: No tracheal deviation.  Cardiovascular:     Rate and Rhythm: Normal rate and regular rhythm.     Pulses: Normal pulses.     Heart sounds: Normal heart sounds. No murmur heard. No friction rub. No gallop.   Pulmonary:     Effort: Pulmonary effort is normal. No respiratory distress.     Breath sounds: Normal breath sounds.  Abdominal:      General: Bowel sounds are normal. There is no distension.     Palpations: Abdomen is soft.     Tenderness: There is no abdominal tenderness. There is no guarding.  Genitourinary:    Comments: No cva tenderness.  Musculoskeletal:        General: No swelling or tenderness.     Cervical back: Normal range of motion and neck supple. No rigidity. No muscular tenderness.  Skin:  General: Skin is warm and dry.     Findings: No rash.  Neurological:     Mental Status: She is alert.     Comments: Alert, speech normal. Steady gait.   Psychiatric:        Mood and Affect: Mood normal.     ED Results / Procedures / Treatments   Labs (all labs ordered are listed, but only abnormal results are displayed) Results for orders placed or performed during the hospital encounter of 12/14/20  Pregnancy, urine  Result Value Ref Range   Preg Test, Ur NEGATIVE NEGATIVE   DG Chest 2 View  Result Date: 12/14/2020 CLINICAL DATA:  Cough. EXAM: CHEST - 2 VIEW COMPARISON:  Stem ir 13 2020. FINDINGS: The heart size and mediastinal contours are within normal limits. Both lungs are clear. The visualized skeletal structures are unremarkable. IMPRESSION: No active cardiopulmonary disease. Electronically Signed   By: Lupita Raider M.D.   On: 12/14/2020 09:24    EKG None  Radiology No results found.  Procedures Procedures   Medications Ordered in ED Medications  ondansetron (ZOFRAN-ODT) disintegrating tablet 8 mg (has no administration in time range)    ED Course  I have reviewed the triage vital signs and the nursing notes.  Pertinent labs & imaging results that were available during my care of the patient were reviewed by me and considered in my medical decision making (see chart for details).    MDM Rules/Calculators/A&P                         Zofran po. Po fluids. Labs. Xrays.   Reviewed nursing notes and prior charts for additional history.   Labs reviewed/interpreted by me - preg test  neg.   CXR reviewed/interpreted by me -  No pna.   Labs reviewed/interpreted by me - u preg neg.   Patient tolerating po, no nv. Abd soft nt. No cough or increased wob noted in ED.   Pt appears stable for d/c.   Rec pcp f/u.   At d/c requests tests for hsv saying her partner told her that one of his partners had hsv. Pt denies any symptoms.    Final Clinical Impression(s) / ED Diagnoses Final diagnoses:  None    Rx / DC Orders ED Discharge Orders    None         Cathren Laine, MD 12/14/20 1019

## 2020-12-26 ENCOUNTER — Other Ambulatory Visit: Payer: Self-pay

## 2020-12-26 ENCOUNTER — Ambulatory Visit (INDEPENDENT_AMBULATORY_CARE_PROVIDER_SITE_OTHER): Payer: Medicaid Other | Admitting: Obstetrics & Gynecology

## 2020-12-26 ENCOUNTER — Other Ambulatory Visit (HOSPITAL_COMMUNITY)
Admission: RE | Admit: 2020-12-26 | Discharge: 2020-12-26 | Disposition: A | Discharge status: Home / Self Care | Payer: Medicaid Other | Source: Ambulatory Visit | Attending: Obstetrics & Gynecology | Admitting: Obstetrics & Gynecology

## 2020-12-26 ENCOUNTER — Encounter: Payer: Self-pay | Admitting: Obstetrics & Gynecology

## 2020-12-26 VITALS — BP 141/97 | HR 86 | Ht 67.0 in | Wt 351.0 lb

## 2020-12-26 DIAGNOSIS — Z01419 Encounter for gynecological examination (general) (routine) without abnormal findings: Secondary | ICD-10-CM

## 2020-12-26 DIAGNOSIS — Z30432 Encounter for removal of intrauterine contraceptive device: Secondary | ICD-10-CM | POA: Diagnosis not present

## 2020-12-26 DIAGNOSIS — T8332XA Displacement of intrauterine contraceptive device, initial encounter: Secondary | ICD-10-CM

## 2020-12-26 DIAGNOSIS — Z975 Presence of (intrauterine) contraceptive device: Secondary | ICD-10-CM

## 2020-12-26 DIAGNOSIS — Z538 Procedure and treatment not carried out for other reasons: Secondary | ICD-10-CM | POA: Diagnosis not present

## 2020-12-26 NOTE — Progress Notes (Signed)
WELL-WOMAN EXAMINATION Patient name: [name] [name] MRN [id number]  Date of birth: 1986/05/19 Chief Complaint:   Gynecologic Exam and Contraception (IUD removal)  History of Present Illness:   [name] [name] is a 35 y.o. [phone] female being seen today for a routine well-woman exam and the following:  IUD removal: She desires removal as she wants another pregnancy   No LMP recorded. (Menstrual status: IUD). Denies issues with her menses The current method of family planning is IUD.    Last pap 2019.  Last mammogram: n/a. Last colonoscopy: n/a  Depression screen [facility] 2/9 12/26/2020 12/31/2018 12/28/2018 05/04/2018  Decreased Interest 1 2 0 0  Down, Depressed, Hopeless 1 2 0 0  PHQ - 2 Score 2 4 0 0  Altered sleeping 3 3 - -  Tired, decreased energy 3 3 - -  Change in appetite 0 3 - -  Feeling bad or failure about yourself  0 1 - -  Trouble concentrating 0 1 - -  Moving slowly or fidgety/restless 0 0 - -  Suicidal thoughts 0 0 - -  PHQ-9 Score 8 15 - -  Difficult doing work/chores - Very difficult - -      Review of Systems:   Pertinent items are noted in HPI Denies any headaches, blurred vision, fatigue, shortness of breath, chest pain, abdominal pain, bowel movements, urination, or intercourse unless otherwise stated above.  Pertinent History Reviewed:  Reviewed past medical,surgical, social and family history.  Reviewed problem list, medications and allergies. Physical Assessment:   Vitals:   12/26/20 1033  BP: (!) 141/97  Pulse: 86  Weight: (!) 351 lb (159.2 kg)  Height: 5\' 7"  (1.702 m)  Body mass index is 54.97 kg/m.        Physical Examination:   General appearance - well appearing, and in no distress  Mental status - alert, oriented to person, place, and time  Psych:  She has a normal mood and affect  Skin - warm and dry, normal color, no suspicious lesions noted  Chest - effort normal, all lung fields clear to auscultation bilaterally  Heart -  normal rate and regular rhythm  Neck:  midline trachea, no thyromegaly or nodules  Breasts - breasts appear normal, no suspicious masses, no skin or nipple changes or  axillary nodes  Abdomen - obese, soft, nontender, nondistended, no masses or organomegaly  Pelvic - VULVA: normal appearing vulva with no masses, tenderness or lesions  VAGINA: normal appearing vagina with normal color and discharge, no lesions  CERVIX: normal appearing cervix without discharge or lesions, no CMT, strings not visualized at os  Thin prep pap is done with HR HPV cotesting  UTERUS: uterus is felt to be normal size, shape, consistency and nontender   ADNEXA: No adnexal masses or tenderness noted.  Bimanual exam limited due to body habitus  Extremities:  No swelling or varicosities noted  Chaperone: Angel Neas    PROCEDURE- IUD REMOVAL Time out was performed.  A sterile speculum was placed in the vagina.  The cervix was visualized, strings were not seen despite use of brush and os finder.  Polyp forceps were used- unable to appreciate device.  brought to bedside- unable to visually observe entry into uterine cavity.  Procedure was unsuccessful.  Instruments were removed.    Assessment & Plan:  1) Well-Woman Exam -pap/STI screening collected  2) IUD strings lost -strings not visualized -unable to removed IUD- discussed options including removal under US guidance vs  hysteroscopy procedure in OR -questions and concerns were addressed desires hysteroscopy  No orders of the defined types were placed in this encounter.   Meds: No orders of the defined types were placed in this encounter.   Follow-up: Return in about 1 year (around 12/26/2021) for Annual.  Referral created to schedule procedure in OR   Myna Hidalgo, DO Attending Obstetrician & Gynecologist, [facility] for [facility], [facility]

## 2020-12-27 LAB — CYTOLOGY - PAP
Chlamydia: NEGATIVE
Comment: NEGATIVE
Comment: NEGATIVE
Comment: NORMAL
Diagnosis: NEGATIVE
High risk HPV: NEGATIVE
Neisseria Gonorrhea: NEGATIVE

## 2021-01-03 NOTE — Patient Instructions (Signed)
[name] [name]  01/03/2021     @PREFPERIOPPHARMACY @   Your procedure is scheduled on 01/09/2021.   Report to [facility] at  1140 A.M.  Call this number if you have problems the morning of surgery:  [phone]   Remember:  Do not eat or drink after midnight.      Take these medicines the morning of surgery with A SIP OF WATER    abilify, zyrtec, singulair, protonix, effexor.  DO NOT take any medications for diabetes the morning of your procedure.     Please brush your teeth.  Do not wear jewelry, make-up or nail polish.  Do not wear lotions, powders, or perfumes, or deodorant.  Do not shave 48 hours prior to surgery.  Men may shave face and neck.  Do not bring valuables to the hospital.  [facility] is not responsible for any belongings or valuables.  Contacts, dentures or bridgework may not be worn into surgery.  Leave your suitcase in the car.  After surgery it may be brought to your room.  For patients admitted to the hospital, discharge time will be determined by your treatment team.  Patients discharged the day of surgery will not be allowed to drive home and must have someone with them for 24 hours.    Special instructions:  DO NOT smoke tobacco or vape for 24 hours before your procedure.  Please read over the following fact sheets that you were given. Coughing and Deep Breathing, Surgical Site Infection Prevention, Anesthesia Post-op Instructions, and Care and Recovery After Surgery          Hysteroscopy Hysteroscopy is a procedure used to look inside a woman's womb (uterus). This may be done for various reasons, including: To look for tumors and other growths in the uterus. To evaluate abnormal bleeding, fibroid tumors, polyps, scar tissue, or uterine cancer. To determine why a woman is unable to get pregnant or has had repeated pregnancy losses. To locate an IUD (intrauterine device). To place a birth control device into the fallopian  tubes. During this procedure, a thin, flexible tube with a small light and camera (hysteroscope) is used to examine the uterus. The camera sends images to a monitor in the room so that your health care provider can view the inside of your uterus. Ahysteroscopy should be done right after a menstrual period. Tell a health care provider about: Any allergies you have. All medicines you are taking, including vitamins, herbs, eye drops, creams, and over-the-counter medicines. Any problems you or family members have had with anesthetic medicines. Any blood disorders you have. Any surgeries you have had. Any medical conditions you have. Whether you are pregnant or may be pregnant. Whether you have been diagnosed with an STI (sexually transmitted infection) or you think you have an STI. What are the risks? Generally, this is a safe procedure. However, problems may occur, including: Excessive bleeding. Infection. Damage to the uterus or other structures or organs. Allergic reaction to medicines or fluids that are used in the procedure. What happens before the procedure? Staying hydrated Follow instructions from your health care provider about hydration  Medicines Ask your health care provider about: Changing or stopping your regular medicines. This is especially important if you are taking diabetes medicines or blood thinners. Taking medicines such as aspirin and ibuprofen. These medicines can thin your blood. Do not take these medicines unless your health care provider tells you to take them. Taking over-the-counter medicines, vitamins, herbs, and  supplements. Medicine may be placed in your cervix the day before the procedure. This medicine causes the cervix to open (dilate). The larger opening makes it easier for the hysteroscope to be inserted into the uterus during the procedure. General instructions Ask your health care provider: What steps will be taken to help prevent infection. These steps  may include: Washing skin with a germ-killing soap. Taking antibiotic medicine. Do not use any products that contain nicotine or tobacco for at least 4 weeks before the procedure. These products include cigarettes, chewing tobacco, and vaping devices, such as e-cigarettes. If you need help quitting, ask your health care provider. Plan to have a responsible adult take you home from the hospital or clinic. Plan to have a responsible adult care for you for the time you are told after you leave the hospital or clinic. This is important. Empty your bladder before the procedure begins. What happens during the procedure? An IV will be inserted into one of your veins. You may be given: A medicine to help you relax (sedative). A medicine that numbs the area around the cervix (local anesthetic). A medicine to make you fall asleep (general anesthetic). A hysteroscope will be inserted through your vagina and into your uterus. Air or fluid will be used to enlarge your uterus to allow your health care provider to see it better. The amount of fluid used will be carefully checked throughout the procedure. In some cases, tissue may be gently scraped from inside the uterus and sent to a lab for testing (biopsy). The procedure may vary among health care providers and hospitals. What can I expect after the procedure? Your blood pressure, heart rate, breathing rate, and blood oxygen level will be monitored until you leave the hospital or clinic. You may have cramps. You may be given medicines for this. You may have bleeding, which may vary from light spotting to menstrual-like bleeding. This is normal. If you had a biopsy, it is up to you to get the results. Ask your health care provider, or the department that is doing the procedure, when your results will be ready. Follow these instructions at home: Activity Rest as told by your health care provider. Return to your normal activities as told by your health care  provider. Ask your health care provider what activities are safe for you. If you were given a sedative during the procedure, it can affect you for several hours. Do not drive or operate machinery until your health care provider says that it is safe. Medicines Do not take aspirin or other NSAIDs during recovery, as told by your healthcare provider. It can increase the risk of bleeding. Ask your health care provider if the medicine prescribed to you: Requires you to avoid driving or using machinery. Can cause constipation. You may need to take these actions to prevent or treat constipation: Drink enough fluid to keep your urine pale yellow. Take over-the-counter or prescription medicines. Eat foods that are high in fiber, such as beans, whole grains, and fresh fruits and vegetables. Limit foods that are high in fat and processed sugars, such as fried or sweet foods. General instructions Do not douche, use tampons, or have sex for 2 weeks after the procedure, or until your health care provider approves. Do not take baths, swim, or use a hot tub until your health care provider approves. Take showers instead of baths for 2 weeks, or for as long as told by your health care provider. Keep all follow-up visits. This  is important. Contact a health care provider if: You feel dizzy or lightheaded. You feel nauseous. You have abnormal vaginal discharge. You have a rash. You have pain that does not get better with medicine. You have chills. Get help right away if: You have bleeding that is heavier than a normal menstrual period. You have a fever. You have pain or cramps that get worse. You develop new abdominal pain. You faint. You have pain in your shoulder. You are short of breath. Summary Hysteroscopy is a procedure that is used to look inside a woman's womb (uterus). After the procedure, you may have bleeding, which varies from light spotting to menstrual-like bleeding. This is normal. You may  also have cramps. Do not douche, use tampons, or have sex for 2 weeks after the procedure, or until your health care provider approves. Plan to have a responsible adult take you home from the hospital or clinic. This information is not intended to replace advice given to you by your health care provider. Make sure you discuss any questions you have with your healthcare provider. Document Revised: 02/23/2020 Document Reviewed: 02/23/2020 Elsevier Patient Education  2022 [employer]. General Anesthesia, Adult, Care After This sheet gives you information about how to care for yourself after your procedure. Your health care provider may also give you more specific instructions. If you have problems or questions, contact your health careprovider. What can I expect after the procedure? After the procedure, the following side effects are common: Pain or discomfort at the IV site. Nausea. Vomiting. Sore throat. Trouble concentrating. Feeling cold or chills. Feeling weak or tired. Sleepiness and fatigue. Soreness and body aches. These side effects can affect parts of the body that were not involved in surgery. Follow these instructions at home: For the time period you were told by your health care provider:  Rest. Do not participate in activities where you could fall or become injured. Do not drive or use machinery. Do not drink alcohol. Do not take sleeping pills or medicines that cause drowsiness. Do not make important decisions or sign legal documents. Do not take care of children on your own.  Eating and drinking Follow any instructions from your health care provider about eating or drinking restrictions. When you feel hungry, start by eating small amounts of foods that are soft and easy to digest (bland), such as toast. Gradually return to your regular diet. Drink enough fluid to keep your urine pale yellow. If you vomit, rehydrate by drinking water, juice, or clear broth. General  instructions If you have sleep apnea, surgery and certain medicines can increase your risk for breathing problems. Follow instructions from your health care provider about wearing your sleep device: Anytime you are sleeping, including during daytime naps. While taking prescription pain medicines, sleeping medicines, or medicines that make you drowsy. Have a responsible adult stay with you for the time you are told. It is important to have someone help care for you until you are awake and alert. Return to your normal activities as told by your health care provider. Ask your health care provider what activities are safe for you. Take over-the-counter and prescription medicines only as told by your health care provider. If you smoke, do not smoke without supervision. Keep all follow-up visits as told by your health care provider. This is important. Contact a health care provider if: You have nausea or vomiting that does not get better with medicine. You cannot eat or drink without vomiting. You have pain that  does not get better with medicine. You are unable to pass urine. You develop a skin rash. You have a fever. You have redness around your IV site that gets worse. Get help right away if: You have difficulty breathing. You have chest pain. You have blood in your urine or stool, or you vomit blood. Summary After the procedure, it is common to have a sore throat or nausea. It is also common to feel tired. Have a responsible adult stay with you for the time you are told. It is important to have someone help care for you until you are awake and alert. When you feel hungry, start by eating small amounts of foods that are soft and easy to digest (bland), such as toast. Gradually return to your regular diet. Drink enough fluid to keep your urine pale yellow. Return to your normal activities as told by your health care provider. Ask your health care provider what activities are safe for you. This  information is not intended to replace advice given to you by your health care provider. Make sure you discuss any questions you have with your healthcare provider. Document Revised: 03/23/2020 Document Reviewed: 10/21/2019 Elsevier Patient Education  2022 Elsevier Inc. How to Use Chlorhexidine for Bathing Chlorhexidine gluconate (CHG) is a germ-killing (antiseptic) solution that is used to clean the skin. It can get rid of the bacteria that normally live on the skin and can keep them away for about 24 hours. To clean your skin with CHG, you may be given: A CHG solution to use in the shower or as part of a sponge bath. A prepackaged cloth that contains CHG. Cleaning your skin with CHG may help lower the risk for infection: While you are staying in the intensive care unit of the hospital. If you have a vascular access, such as a central line, to provide short-term or long-term access to your veins. If you have a catheter to drain urine from your bladder. If you are on a ventilator. A ventilator is a machine that helps you breathe by moving air in and out of your lungs. After surgery. What are the risks? Risks of using CHG include: A skin reaction. Hearing loss, if CHG gets in your ears. Eye injury, if CHG gets in your eyes and is not rinsed out. The CHG product catching fire. Make sure that you avoid smoking and flames after applying CHG to your skin. Do not use CHG: If you have a chlorhexidine allergy or have previously reacted to chlorhexidine. On babies younger than 55 months of age. How to use CHG solution Use CHG only as told by your health care provider, and follow the instructions on the label. Use the full amount of CHG as directed. Usually, this is one bottle. During a shower Follow these steps when using CHG solution during a shower (unless your health care provider gives you different instructions): Start the shower. Use your normal soap and shampoo to wash your face and  hair. Turn off the shower or move out of the shower stream. Pour the CHG onto a clean washcloth. Do not use any type of brush or rough-edged sponge. Starting at your neck, lather your body down to your toes. Make sure you follow these instructions: If you will be having surgery, pay special attention to the part of your body where you will be having surgery. Scrub this area for at least 1 minute. Do not use CHG on your head or face. If the solution gets into  your ears or eyes, rinse them well with water. Avoid your genital area. Avoid any areas of skin that have broken skin, cuts, or scrapes. Scrub your back and under your arms. Make sure to wash skin folds. Let the lather sit on your skin for 1-2 minutes or as long as told by your health care provider. Thoroughly rinse your entire body in the shower. Make sure that all body creases and crevices are rinsed well. Dry off with a clean towel. Do not put any substances on your body afterward--such as powder, lotion, or perfume--unless you are told to do so by your health care provider. Only use lotions that are recommended by the manufacturer. Put on clean clothes or pajamas. If it is the night before your surgery, sleep in clean sheets.  During a sponge bath Follow these steps when using CHG solution during a sponge bath (unless your health care provider gives you different instructions): Use your normal soap and shampoo to wash your face and hair. Pour the CHG onto a clean washcloth. Starting at your neck, lather your body down to your toes. Make sure you follow these instructions: If you will be having surgery, pay special attention to the part of your body where you will be having surgery. Scrub this area for at least 1 minute. Do not use CHG on your head or face. If the solution gets into your ears or eyes, rinse them well with water. Avoid your genital area. Avoid any areas of skin that have broken skin, cuts, or scrapes. Scrub your back  and under your arms. Make sure to wash skin folds. Let the lather sit on your skin for 1-2 minutes or as long as told by your health care provider. Using a different clean, wet washcloth, thoroughly rinse your entire body. Make sure that all body creases and crevices are rinsed well. Dry off with a clean towel. Do not put any substances on your body afterward--such as powder, lotion, or perfume--unless you are told to do so by your health care provider. Only use lotions that are recommended by the manufacturer. Put on clean clothes or pajamas. If it is the night before your surgery, sleep in clean sheets. How to use CHG prepackaged cloths Only use CHG cloths as told by your health care provider, and follow the instructions on the label. Use the CHG cloth on clean, dry skin. Do not use the CHG cloth on your head or face unless your health care provider tells you to. When washing with the CHG cloth: Avoid your genital area. Avoid any areas of skin that have broken skin, cuts, or scrapes. Before surgery Follow these steps when using a CHG cloth to clean before surgery (unless your health care provider gives you different instructions): Using the CHG cloth, vigorously scrub the part of your body where you will be having surgery. Scrub using a back-and-forth motion for 3 minutes. The area on your body should be completely wet with CHG when you are done scrubbing. Do not rinse. Discard the cloth and let the area air-dry. Do not put any substances on the area afterward, such as powder, lotion, or perfume. Put on clean clothes or pajamas. If it is the night before your surgery, sleep in clean sheets.  For general bathing Follow these steps when using CHG cloths for general bathing (unless your health care provider gives you different instructions). Use a separate CHG cloth for each area of your body. Make sure you wash between any folds  of skin and between your fingers and toes. Wash your body in the  following order, switching to a new cloth after each step: The front of your neck, shoulders, and chest. Both of your arms, under your arms, and your hands. Your stomach and groin area, avoiding the genitals. Your right leg and foot. Your left leg and foot. The back of your neck, your back, and your buttocks. Do not rinse. Discard the cloth and let the area air-dry. Do not put any substances on your body afterward--such as powder, lotion, or perfume--unless you are told to do so by your health care provider. Only use lotions that are recommended by the manufacturer. Put on clean clothes or pajamas. Contact a health care provider if: Your skin gets irritated after scrubbing. You have questions about using your solution or cloth. Get help right away if: Your eyes become very red or swollen. Your eyes itch badly. Your skin itches badly and is red or swollen. Your hearing changes. You have trouble seeing. You have swelling or tingling in your mouth or throat. You have trouble breathing. You swallow any chlorhexidine. Summary Chlorhexidine gluconate (CHG) is a germ-killing (antiseptic) solution that is used to clean the skin. Cleaning your skin with CHG may help to lower your risk for infection. You may be given CHG to use for bathing. It may be in a bottle or in a prepackaged cloth to use on your skin. Carefully follow your health care provider's instructions and the instructions on the product label. Do not use CHG if you have a chlorhexidine allergy. Contact your health care provider if your skin gets irritated after scrubbing. This information is not intended to replace advice given to you by your health care provider. Make sure you discuss any questions you have with your healthcare provider. Document Revised: 11/19/2019 Document Reviewed: 12/24/2019 Elsevier Patient Education  2022 [employer].

## 2021-01-04 ENCOUNTER — Encounter (HOSPITAL_COMMUNITY)
Admission: RE | Admit: 2021-01-04 | Discharge: 2021-01-04 | Disposition: A | Discharge status: Home / Self Care | Payer: Medicaid Other | Source: Ambulatory Visit | Attending: Obstetrics & Gynecology | Admitting: Obstetrics & Gynecology

## 2021-01-04 NOTE — Pre-Procedure Instructions (Signed)
Dr Waldon Merl office notified that patient was a no show for her preop appointment.

## 2021-01-05 ENCOUNTER — Encounter (HOSPITAL_COMMUNITY)
Admission: RE | Admit: 2021-01-05 | Discharge: 2021-01-05 | Disposition: A | Discharge status: Home / Self Care | Payer: Medicaid Other | Source: Ambulatory Visit | Attending: Obstetrics & Gynecology | Admitting: Obstetrics & Gynecology

## 2021-01-05 ENCOUNTER — Other Ambulatory Visit: Payer: Self-pay

## 2021-01-05 DIAGNOSIS — Z01812 Encounter for preprocedural laboratory examination: Secondary | ICD-10-CM | POA: Insufficient documentation

## 2021-01-05 LAB — RAPID URINE DRUG SCREEN, HOSP PERFORMED
Amphetamines: NOT DETECTED
Barbiturates: NOT DETECTED
Benzodiazepines: NOT DETECTED
Cocaine: NOT DETECTED
Opiates: NOT DETECTED
Tetrahydrocannabinol: NOT DETECTED

## 2021-01-05 LAB — CBC WITH DIFFERENTIAL/PLATELET
Abs Immature Granulocytes: 0.03 10*3/uL (ref 0.00–0.07)
Basophils Absolute: 0.1 10*3/uL (ref 0.0–0.1)
Basophils Relative: 1 %
Eosinophils Absolute: 0.1 10*3/uL (ref 0.0–0.5)
Eosinophils Relative: 1 %
HCT: 42.9 % (ref 36.0–46.0)
Hemoglobin: 13.2 g/dL (ref 12.0–15.0)
Immature Granulocytes: 0 %
Lymphocytes Relative: 19 %
Lymphs Abs: 2.1 10*3/uL (ref 0.7–4.0)
MCH: 25.6 pg — ABNORMAL LOW (ref 26.0–34.0)
MCHC: 30.8 g/dL (ref 30.0–36.0)
MCV: 83.3 fL (ref 80.0–100.0)
Monocytes Absolute: 0.7 10*3/uL (ref 0.1–1.0)
Monocytes Relative: 6 %
Neutro Abs: 8.6 10*3/uL — ABNORMAL HIGH (ref 1.7–7.7)
Neutrophils Relative %: 73 %
Platelets: 278 10*3/uL (ref 150–400)
RBC: 5.15 MIL/uL — ABNORMAL HIGH (ref 3.87–5.11)
RDW: 15.5 % (ref 11.5–15.5)
WBC: 11.5 10*3/uL — ABNORMAL HIGH (ref 4.0–10.5)
nRBC: 0 % (ref 0.0–0.2)

## 2021-01-05 LAB — BASIC METABOLIC PANEL
Anion gap: 8 (ref 5–15)
BUN: 12 mg/dL (ref 6–20)
CO2: 24 mmol/L (ref 22–32)
Calcium: 8.8 mg/dL — ABNORMAL LOW (ref 8.9–10.3)
Chloride: 105 mmol/L (ref 98–111)
Creatinine, Ser: 1.06 mg/dL — ABNORMAL HIGH (ref 0.44–1.00)
GFR, Estimated: >60 mL/min (ref >60)
Glucose, Bld: 97 mg/dL (ref 70–99)
Potassium: 3.7 mmol/L (ref 3.5–5.1)
Sodium: 137 mmol/L (ref 135–145)

## 2021-01-05 LAB — PREGNANCY, URINE: Preg Test, Ur: NEGATIVE

## 2021-01-06 LAB — HEMOGLOBIN A1C
Hgb A1c MFr Bld: 5.7 % — ABNORMAL HIGH (ref 4.8–5.6)
Mean Plasma Glucose: 117 mg/dL

## 2021-01-08 ENCOUNTER — Telehealth: Payer: Self-pay | Admitting: Obstetrics & Gynecology

## 2021-01-08 NOTE — H&P (Signed)
[name] [name] is a 35 y.o. [phone] female who presents for hysteroscopy, IUD removal due to lost IUD strings/migration.   She desires removal as she wants another pregnancy.  Unable to remove in office as strings were not visualized on exam     No LMP with Mirena.  Denies issues with her menses    Last pap 2019. Last mammogram: n/a. Last colonoscopy: n/a   Review of Systems:  Pertinent items are noted in HPI Denies any headaches, blurred vision, fatigue, shortness of breath, chest pain, abdominal pain, bowel movements, urination, or intercourse unless otherwise stated above.   Pertinent History Reviewed:  Reviewed past medical,surgical, social and family history. Reviewed problem list, medications and allergies. Physical Assessment:    Examination in office:     Vitals:    12/26/20 1033  BP: (!) 141/97  Pulse: 86  Weight: (!) 351 lb (159.2 kg)  Height: 5\' 7"  (1.702 m)  Body mass index is 54.97 kg/m.        Physical Examination:              General appearance - well appearing, and in no distress             Mental status - alert, oriented to person, place, and time             Psych:  She has a normal mood and affect             Skin - warm and dry, normal color, no suspicious lesions noted             Chest - effort normal, all lung fields clear to auscultation bilaterally             Heart - normal rate and regular rhythm             Neck:  midline trachea, no thyromegaly or nodules             Breasts - breasts appear normal, no suspicious masses, no skin or nipple changes or     axillary nodes             Abdomen - obese, soft, nontender, nondistended, no masses or organomegaly             Pelvic - VULVA: normal appearing vulva with no masses, tenderness or lesions   VAGINA: normal appearing vagina with normal color and discharge, no lesions       CERVIX: normal appearing cervix without discharge or lesions, no CMT, strings not visualized at os             Thin prep  pap is done with HR HPV cotesting             UTERUS: uterus is felt to be normal size, shape, consistency and nontender             ADNEXA: No adnexal masses or tenderness noted.  Bimanual exam limited due to body habitus             Extremities:  No swelling or varicosities noted  34yo who presents for hysteroscopy, IUD removal -NPO -LR @ 125cc/hr -Risk/benefit and alternatives reviewed with patient including but not limited to risk of bleeding, infection, uterine perforation.  Questions and concerns were addressed and she desires to proceed.    [id number], DO Attending Obstetrician & Gynecologist, [facility] for [facility]

## 2021-01-08 NOTE — Telephone Encounter (Signed)
Pt has some questions before her surgery tomorrow with Dr. Charlotta Newton  Would like to know how long will the surgery take & how long will she be out of work?  Please advise & notify pt

## 2021-01-09 ENCOUNTER — Encounter (HOSPITAL_COMMUNITY): Payer: Self-pay | Admitting: Obstetrics & Gynecology

## 2021-01-09 ENCOUNTER — Ambulatory Visit (HOSPITAL_COMMUNITY)
Admission: RE | Admit: 2021-01-09 | Discharge: 2021-01-09 | Disposition: A | Discharge status: Home / Self Care | Payer: Medicaid Other | Source: Ambulatory Visit | Attending: Obstetrics & Gynecology | Admitting: Obstetrics & Gynecology

## 2021-01-09 ENCOUNTER — Ambulatory Visit (HOSPITAL_COMMUNITY): Payer: Medicaid Other | Admitting: Anesthesiology

## 2021-01-09 ENCOUNTER — Encounter (HOSPITAL_COMMUNITY)
Admission: RE | Disposition: A | Discharge status: Home / Self Care | Payer: Self-pay | Source: Ambulatory Visit | Attending: Obstetrics & Gynecology

## 2021-01-09 DIAGNOSIS — N854 Malposition of uterus: Secondary | ICD-10-CM | POA: Insufficient documentation

## 2021-01-09 DIAGNOSIS — Y762 Prosthetic and other implants, materials and accessory obstetric and gynecological devices associated with adverse incidents: Secondary | ICD-10-CM | POA: Insufficient documentation

## 2021-01-09 DIAGNOSIS — Z30432 Encounter for removal of intrauterine contraceptive device: Secondary | ICD-10-CM

## 2021-01-09 DIAGNOSIS — T8332XD Displacement of intrauterine contraceptive device, subsequent encounter: Secondary | ICD-10-CM

## 2021-01-09 DIAGNOSIS — T8332XA Displacement of intrauterine contraceptive device, initial encounter: Secondary | ICD-10-CM | POA: Insufficient documentation

## 2021-01-09 HISTORY — PX: HYSTEROSCOPY: SHX211

## 2021-01-09 HISTORY — PX: IUD REMOVAL: SHX5392

## 2021-01-09 LAB — GLUCOSE, CAPILLARY: Glucose-Capillary: 97 mg/dL (ref 70–99)

## 2021-01-09 SURGERY — HYSTEROSCOPY
Anesthesia: General

## 2021-01-09 MED ORDER — FENTANYL CITRATE (PF) 100 MCG/2ML IJ SOLN
INTRAMUSCULAR | Status: DC | PRN
  Administered 2021-01-09: 50 ug via INTRAVENOUS
  Administered 2021-01-09 (×2): 25 ug via INTRAVENOUS

## 2021-01-09 MED ORDER — LIDOCAINE HCL (CARDIAC) PF 100 MG/5ML IV SOSY
PREFILLED_SYRINGE | INTRAVENOUS | Status: DC | PRN
  Administered 2021-01-09: 60 mg via INTRAVENOUS

## 2021-01-09 MED ORDER — LACTATED RINGERS IV SOLN: INTRAVENOUS | Status: DC

## 2021-01-09 MED ORDER — BUPIVACAINE-EPINEPHRINE (PF) 0.25% -1:200000 IJ SOLN
INTRAMUSCULAR | Status: AC
  Filled 2021-01-09: qty 30

## 2021-01-09 MED ORDER — ONDANSETRON HCL 4 MG/2ML IJ SOLN
INTRAMUSCULAR | Status: AC
  Filled 2021-01-09: qty 4

## 2021-01-09 MED ORDER — ORAL CARE MOUTH RINSE: 15.0000 mL | Freq: Once | OROMUCOSAL | Status: AC

## 2021-01-09 MED ORDER — CHLORHEXIDINE GLUCONATE 0.12 % MT SOLN
15.0000 mL | Freq: Once | OROMUCOSAL | Status: AC
  Administered 2021-01-09: 15 mL via OROMUCOSAL
  Filled 2021-01-09: qty 15

## 2021-01-09 MED ORDER — PROPOFOL 10 MG/ML IV BOLUS
INTRAVENOUS | Status: AC
  Filled 2021-01-09: qty 20

## 2021-01-09 MED ORDER — PROPOFOL 10 MG/ML IV BOLUS
INTRAVENOUS | Status: DC | PRN
  Administered 2021-01-09: 50 mg via INTRAVENOUS

## 2021-01-09 MED ORDER — SODIUM CHLORIDE 0.9 % IR SOLN
Status: DC | PRN
  Administered 2021-01-09: 1000 mL

## 2021-01-09 MED ORDER — MIDAZOLAM HCL 2 MG/2ML IJ SOLN
INTRAMUSCULAR | Status: AC
  Filled 2021-01-09: qty 2

## 2021-01-09 MED ORDER — MIDAZOLAM HCL 2 MG/2ML IJ SOLN
INTRAMUSCULAR | Status: DC | PRN
  Administered 2021-01-09: 2 mg via INTRAVENOUS

## 2021-01-09 MED ORDER — ONDANSETRON HCL 4 MG/2ML IJ SOLN
INTRAMUSCULAR | Status: DC | PRN
  Administered 2021-01-09: 4 mg via INTRAVENOUS

## 2021-01-09 MED ORDER — DEXAMETHASONE SODIUM PHOSPHATE 10 MG/ML IJ SOLN
INTRAMUSCULAR | Status: DC | PRN
  Administered 2021-01-09: 5 mg via INTRAVENOUS

## 2021-01-09 MED ORDER — BUPIVACAINE-EPINEPHRINE (PF) 0.25% -1:200000 IJ SOLN
INTRAMUSCULAR | Status: DC | PRN
  Administered 2021-01-09: 20 mL

## 2021-01-09 MED ORDER — LIDOCAINE HCL (PF) 2 % IJ SOLN
INTRAMUSCULAR | Status: AC
  Filled 2021-01-09: qty 10

## 2021-01-09 MED ORDER — SODIUM CHLORIDE 0.9 % IR SOLN
Status: DC | PRN
  Administered 2021-01-09: 3000 mL

## 2021-01-09 MED ORDER — MEPERIDINE HCL 50 MG/ML IJ SOLN: 6.2500 mg | INTRAMUSCULAR | Status: DC | PRN

## 2021-01-09 MED ORDER — DEXAMETHASONE SODIUM PHOSPHATE 10 MG/ML IJ SOLN
INTRAMUSCULAR | Status: AC
  Filled 2021-01-09: qty 2

## 2021-01-09 MED ORDER — FENTANYL CITRATE (PF) 100 MCG/2ML IJ SOLN: 25.0000 ug | INTRAMUSCULAR | Status: DC | PRN

## 2021-01-09 MED ORDER — PROPOFOL 500 MG/50ML IV EMUL
INTRAVENOUS | Status: DC | PRN
  Administered 2021-01-09: 150 ug/kg/min via INTRAVENOUS

## 2021-01-09 MED ORDER — ONDANSETRON HCL 4 MG/2ML IJ SOLN: 4.0000 mg | Freq: Once | INTRAMUSCULAR | Status: DC | PRN

## 2021-01-09 MED ORDER — FENTANYL CITRATE (PF) 100 MCG/2ML IJ SOLN
INTRAMUSCULAR | Status: AC
  Filled 2021-01-09: qty 2

## 2021-01-09 SURGICAL SUPPLY — 19 items
CATH ROBINSON RED A/P 16FR (CATHETERS) ×3 IMPLANT
COVER SURGICAL LIGHT HANDLE (MISCELLANEOUS) ×3 IMPLANT
DECANTER SPIKE VIAL GLASS SM (MISCELLANEOUS) ×3 IMPLANT
GAUZE SPONGE 4X4 16PLY XRAY LF (GAUZE/BANDAGES/DRESSINGS) ×3 IMPLANT
GLOVE SURG LTX SZ6.5 (GLOVE) ×3 IMPLANT
GLOVE SURG UNDER POLY LF SZ7 (GLOVE) ×6 IMPLANT
GOWN STRL REUS W/ TWL LRG LVL3 (GOWN DISPOSABLE) ×2 IMPLANT
GOWN STRL REUS W/TWL LRG LVL3 (GOWN DISPOSABLE) ×6
KIT PROCEDURE FLUENT (KITS) ×3 IMPLANT
KIT TURNOVER CYSTO (KITS) ×3 IMPLANT
NEEDLE HYPO 25X1 1.5 SAFETY (NEEDLE) ×3 IMPLANT
NEEDLE SPNL 22GX3.5 QUINCKE BK (NEEDLE) ×3 IMPLANT
NS IRRIG 500ML POUR BTL (IV SOLUTION) ×3 IMPLANT
PACK SURGICAL SETUP 50X90 (CUSTOM PROCEDURE TRAY) ×3 IMPLANT
PAD ARMBOARD 7.5X6 YLW CONV (MISCELLANEOUS) ×6 IMPLANT
SET BASIN LINEN APH (SET/KITS/TRAYS/PACK) ×3 IMPLANT
SET CYSTO W/LG BORE CLAMP LF (SET/KITS/TRAYS/PACK) ×3 IMPLANT
SYR CONTROL 10ML LL (SYRINGE) ×3 IMPLANT
YANKAUER SUCT BULB TIP 10FT TU (MISCELLANEOUS) ×3 IMPLANT

## 2021-01-09 NOTE — Op Note (Signed)
Operative Report  PreOp: IUD migration PostOp: same Procedure:  Hysteroscopy, IUD removal Surgeon: Dr. Janyth Pupa Anesthesia: MAC, cervical block Complications:none EBL: Minimal IVF:500cc  Findings: Anteverted uterus, IUD within uterine cavity  Indications: Desires IUD removal, IUD migration  Procedure: The patient was taken to the operating room where she underwent MAC anesthesia without difficulty. The patient was placed in a low lithotomy position using Allen stirrups. She was prepped and draped in the normal sterile fashion. A sterile speculum was inserted into the vagina. Cervical block was completed using quarter[ percent lidocaine with epinephrine.   A single tooth tenaculum was placed on the anterior lip of the cervix.  The uterus was then sounded to 8cm. The endocervical canal was then serially dilated using Hank dilators.  The diagnostic hysteroscope was then inserted without difficulty.  The device with strings were visualized, grasped and removed in its entirety.  Hysteroscope was reinserted, no uterine perforation was seen.  All instrument were then removed. Hemostasis was observed at the cervical site. The patient was repositioned to the supine position. The patient tolerated the procedure without any complications and taken to recovery in stable condition.   Janyth Pupa, DO [phone] (pager) [phone] (office)

## 2021-01-09 NOTE — Discharge Instructions (Addendum)
HOME INSTRUCTIONS  Please note any unusual or excessive bleeding, pain, swelling. Mild dizziness or drowsiness are normal for about 24 hours after surgery.   Shower when comfortable  Restrictions: No driving for 24 hours  Activity:  You may return to your regular activity.    Diet:  You may return to your regular diet.  Do not eat large meals.  Eat small frequent meals throughout the day.  Continue to drink a good amount of water at least 6-8 glasses of water per day, hydration is very important for the healing process.  Pain Management: Take Motrin and/or tylenol as needed.  Nausea: Take sips of ginger ale or soda  Fever -- Call physician if temperature over 101 degrees  Follow up:  If you experience fever (a temperature greater than 100.4), pain unrelieved by pain medication, shortness of breath, swelling of a single leg, or any other symptoms which are concerning to you please the office immediately.

## 2021-01-09 NOTE — Anesthesia Postprocedure Evaluation (Signed)
Anesthesia Post Note  Patient: [name] [name]  Procedure(s) Performed: HYSTEROSCOPY INTRAUTERINE DEVICE (IUD) REMOVAL  Patient location during evaluation: PACU Anesthesia Type: General Level of consciousness: awake and alert and oriented Pain management: pain level controlled Vital Signs Assessment: post-procedure vital signs reviewed and stable Respiratory status: spontaneous breathing and respiratory function stable Cardiovascular status: blood pressure returned to baseline and stable Postop Assessment: no apparent nausea or vomiting Anesthetic complications: no   No notable events documented.   Last Vitals:  Vitals:   01/09/21 1209 01/09/21 1213  BP: 121/81 136/89  Pulse:  88  Resp:  18  Temp:  36.4 C  SpO2:  95%    Last Pain:  Vitals:   01/09/21 1213  TempSrc: Oral  PainSc: 0-No pain                 Laterria Lasota C Tyon Cerasoli

## 2021-01-09 NOTE — Interval H&P Note (Signed)
**Note [name]-Identified via Obfuscation** History and Physical Interval Note:  01/09/2021 10:54 AM  [name] [name]  has presented today for surgery, with the diagnosis of Lost IUD String.  The various methods of treatment have been discussed with the patient and family. After consideration of risks, benefits and other options for treatment, the patient has consented to  Procedure(s) with comments: HYSTEROSCOPY (N/A) - pt knows to arrive at 9:30 INTRAUTERINE DEVICE (IUD) REMOVAL (N/A) as a surgical intervention.  The patient's history has been reviewed, patient examined, no change in status, stable for surgery.  I have reviewed the patient's chart and labs.  Questions were answered to the patient's satisfaction.     Sharon Seller

## 2021-01-09 NOTE — Transfer of Care (Signed)
Immediate Anesthesia Transfer of Care Note  Patient: [name] [name]  Procedure(s) Performed: HYSTEROSCOPY INTRAUTERINE DEVICE (IUD) REMOVAL  Patient Location: PACU  Anesthesia Type:MAC  Level of Consciousness: awake, alert  and oriented  Airway & Oxygen Therapy: Patient Spontanous Breathing  Post-op Assessment: Report given to RN and Post -op Vital signs reviewed and stable  Post vital signs: Reviewed and stable  Last Vitals:  Vitals Value Taken Time  BP 126/73 01/09/21 1152  Temp    Pulse 97 01/09/21 1152  Resp 34 01/09/21 1152  SpO2 97 on RA 01/09/21 1152  Vitals shown include unvalidated device data.  Last Pain:  Vitals:   01/09/21 0944  TempSrc: Oral  PainSc: 2       Patients Stated Pain Goal: 8 (26/83/41 9622)  Complications: No notable events documented.

## 2021-01-09 NOTE — Anesthesia Preprocedure Evaluation (Addendum)
Anesthesia Evaluation  Patient identified by MRN, date of birth, ID band Patient awake    Reviewed: Allergy & Precautions, NPO status , Patient's Chart, lab work & pertinent test results  History of Anesthesia Complications Negative for: history of anesthetic complications  Airway Mallampati: II  TM Distance: >3 FB Neck ROM: Full    Dental  (+) Dental Advisory Given, Missing, Chipped Broken teethx2:   Pulmonary former smoker,    Pulmonary exam normal breath sounds clear to auscultation       Cardiovascular Exercise Tolerance: Good Normal cardiovascular exam Rhythm:Regular Rate:Normal     Neuro/Psych  Headaches, PSYCHIATRIC DISORDERS Anxiety Depression Bipolar Disorder    GI/Hepatic negative GI ROS, (+)     substance abuse  marijuana use,   Endo/Other  Morbid obesity  Renal/GU Renal disease     Musculoskeletal negative musculoskeletal ROS (+)   Abdominal   Peds  Hematology negative hematology ROS (+)   Anesthesia Other Findings   Reproductive/Obstetrics negative OB ROS                            Anesthesia Physical Anesthesia Plan  ASA: 3  Anesthesia Plan: General   Post-op Pain Management:    Induction: Intravenous  PONV Risk Score and Plan: 4 or greater and Ondansetron and Dexamethasone  Airway Management Planned: Nasal Cannula, Natural Airway and Simple Face Mask  Additional Equipment:   Intra-op Plan:   Post-operative Plan:   Informed Consent: I have reviewed the patients History and Physical, chart, labs and discussed the procedure including the risks, benefits and alternatives for the proposed anesthesia with the patient or authorized representative who has indicated his/her understanding and acceptance.       Plan Discussed with: CRNA and Surgeon  Anesthesia Plan Comments:        Anesthesia Quick Evaluation

## 2021-01-10 ENCOUNTER — Encounter (HOSPITAL_COMMUNITY): Payer: Self-pay | Admitting: Obstetrics & Gynecology

## 2021-01-15 ENCOUNTER — Telehealth: Payer: Self-pay | Admitting: Obstetrics & Gynecology

## 2021-01-15 NOTE — Telephone Encounter (Signed)
Pt aware no harm in trying to get pregnant per Dr. Despina Hidden. JSY

## 2021-01-15 NOTE — Telephone Encounter (Signed)
Pt had IUD removed on 6/21. She had no bleeding until 6/23. Pt had light bleeding when the IUD was in place. Pt was advised it's normal to have bleeding once IUD is removed. Pt wants to know if they can start trying to get pregnant. Please advise. Thanks!! JSY

## 2021-01-15 NOTE — Telephone Encounter (Signed)
Patient called stating that she has question post surgery and would like to speak with a nurse. Please contact pt

## 2021-01-18 ENCOUNTER — Encounter: Payer: Medicaid Other | Admitting: Obstetrics & Gynecology

## 2021-01-19 ENCOUNTER — Encounter: Payer: Self-pay | Admitting: Obstetrics & Gynecology

## 2021-01-19 ENCOUNTER — Ambulatory Visit (INDEPENDENT_AMBULATORY_CARE_PROVIDER_SITE_OTHER): Payer: Medicaid Other | Admitting: Obstetrics & Gynecology

## 2021-01-19 ENCOUNTER — Other Ambulatory Visit: Payer: Self-pay

## 2021-01-19 VITALS — BP 132/91 | HR 89 | Ht 65.0 in | Wt 352.2 lb

## 2021-01-19 DIAGNOSIS — Z9889 Other specified postprocedural states: Secondary | ICD-10-CM

## 2021-01-19 NOTE — Progress Notes (Signed)
  HPI: Patient returns for routine postoperative follow-up having undergone hysteroscopic removal of IUD without complications  on 01/09/21.  The patient's immediate postoperative recovery has been unremarkable. Since hospital discharge the patient reports no problems.   Current Outpatient Medications: ARIPiprazole (ABILIFY) 10 MG tablet, Take 10 mg by mouth daily., Disp: , Rfl:  cetirizine (ZYRTEC) 10 MG tablet, Take 10 mg by mouth daily., Disp: , Rfl:  FOLIC ACID PO, Take by mouth., Disp: , Rfl:  metFORMIN (GLUCOPHAGE) 500 MG tablet, Take 500 mg by mouth 2 (two) times daily., Disp: , Rfl:  montelukast (SINGULAIR) 10 MG tablet, Take 10 mg by mouth daily., Disp: , Rfl:  pantoprazole (PROTONIX) 40 MG tablet, Take 40 mg by mouth daily., Disp: , Rfl:  Prenatal Vit-Fe Fumarate-FA (PRENATAL VITAMIN PO), Take by mouth., Disp: , Rfl:  traZODone (DESYREL) 150 MG tablet, Take 150 mg by mouth at bedtime., Disp: , Rfl:  venlafaxine XR (EFFEXOR-XR) 150 MG 24 hr capsule, Take 150 mg by mouth daily with breakfast., Disp: , Rfl:   No current facility-administered medications for this visit.    Blood pressure (!) 132/91, pulse 89, height 5\' 5"  (1.651 m), weight (!) 352 lb 3.2 oz (159.8 kg), last menstrual period 01/12/2021.  Physical Exam: Normal post op  Diagnostic Tests:   Pathology:   Impression:   ICD-10-CM   1. Post-operative state  Z98.890          Plan: Pt declines birth control    Follow up: Prn    01/14/2021, MD

## 2021-06-12 ENCOUNTER — Other Ambulatory Visit: Payer: Self-pay

## 2021-06-12 ENCOUNTER — Encounter (HOSPITAL_COMMUNITY): Payer: Self-pay | Admitting: *Deleted

## 2021-06-12 ENCOUNTER — Emergency Department (HOSPITAL_COMMUNITY)
Admission: EM | Admit: 2021-06-12 | Discharge: 2021-06-12 | Disposition: A | Discharge status: Home / Self Care | Payer: Medicaid Other | Attending: Emergency Medicine | Admitting: Emergency Medicine

## 2021-06-12 DIAGNOSIS — J069 Acute upper respiratory infection, unspecified: Secondary | ICD-10-CM

## 2021-06-12 DIAGNOSIS — Z87891 Personal history of nicotine dependence: Secondary | ICD-10-CM | POA: Insufficient documentation

## 2021-06-12 DIAGNOSIS — Z20822 Contact with and (suspected) exposure to covid-19: Secondary | ICD-10-CM | POA: Insufficient documentation

## 2021-06-12 LAB — RESP PANEL BY RT-PCR (FLU A&B, COVID) ARPGX2
Influenza A by PCR: NEGATIVE
Influenza B by PCR: NEGATIVE
SARS Coronavirus 2 by RT PCR: NEGATIVE

## 2021-06-12 NOTE — ED Triage Notes (Signed)
Pt with cough, runny nose and HA x 3 days.

## 2021-06-12 NOTE — ED Notes (Signed)
Dc instructions reviewed with pt. No questions at this time.

## 2021-06-12 NOTE — Discharge Instructions (Signed)
Likely you have a viral illness, I recommend ibuprofen and Tylenol as needed for pain and fever control.  You may take Zyrtec as well as Flonase for nasal decongestion.  May also use Mucinex for cough suppressants.  Please drink plenty of fluids, if you have appetite I recommend Gatorade for electrolyte supplementation.  Follow-up with pediatrician weeks time symptoms not fully resolved.  Come back to the emergency department if you develop chest pain, shortness of breath, severe abdominal pain, uncontrolled nausea, vomiting, diarrhea.

## 2021-06-12 NOTE — ED Provider Notes (Signed)
[facility] EMERGENCY DEPARTMENT Provider Note   CSN: [id number] Arrival date & time: 06/12/21  1312     History Chief Complaint  Patient presents with   Cough    [name] [name] is a 35 y.o. female.  HPI  Patient with no significant medical history presents with chief complaint of URI-like symptoms.  Patient states symptoms started about 3 days ago, she endorses fevers, chills, headaches, nasal ingestion, productive cough, general body aches.  She denies chest pain, shortness of breath, abdominal pain, nausea, vomit, diarrhea, states he still tolerating p.o. without any difficulty.  She is vaccine against COVID and influenza, she is not immunocompromise, she does admit to coming in contact with someone who has the flu a few days ago.  She also notes that her son is sick and has symptoms very similar symptoms to her.  she has been taking over-the-counter medication like Mucinex ibuprofen Tylenol without much relief.  She denies  alleviating or aggravating factors at this time.  Past Medical History:  Diagnosis Date   Abnormal Pap smear    Anxiety    Bipolar 1 disorder (HCC)    Depression    IUD (intrauterine device) in place 04/12/2013   Had IUD inserted 03/29/13 could not feel strings went to ER 9/18 and KUB saw IUD still could not see string, can't see now but seen in US   Kidney infection    Morbid obesity (HCC)    Pregnancy    UTI (urinary tract infection)     Patient Active Problem List   Diagnosis Date Noted   Morbid obesity (HCC)    BMI 50.0-59.9, adult (HCC) 11/30/2018   Weight loss counseling, encounter for 11/30/2018   Encounter for IUD insertion 09/01/2018   Menorrhagia with regular cycle 05/04/2018   Persistent headaches 08/03/2013   Obesity, morbid, BMI 50 or higher (HCC) 08/03/2013   Snoring 08/03/2013   Chlamydia 01/18/2013   DUB (dysfunctional uterine bleeding) 01/13/2013   Pyelonephritis 07/18/2011   Obesity, Class III, BMI 40-49.9 (morbid obesity) (HCC)  07/18/2011    Past Surgical History:  Procedure Laterality Date   CESAREAN SECTION     HYSTEROSCOPY N/A 01/09/2021   Procedure: HYSTEROSCOPY;  Surgeon: Myna Hidalgo, DO;  Location: [facility];  Service: Gynecology;  Laterality: N/A;   IUD REMOVAL N/A 01/09/2021   Procedure: INTRAUTERINE DEVICE (IUD) REMOVAL;  Surgeon: Myna Hidalgo, DO;  Location: [facility];  Service: Gynecology;  Laterality: N/A;   TOOTH EXTRACTION       OB History     Gravida  4   Para  2   Term  1   Preterm  1   AB  2   Living  2      SAB  2   IAB  0   Ectopic  0   Multiple  0   Live Births  2           Family History  Problem Relation Age of Onset   Hypertension Father    Diabetes Father    Congestive Heart Failure Father    Hypertension Paternal Grandmother    Diabetes Paternal Grandmother    Diabetes Paternal Grandfather    Diabetes Paternal Aunt    Diabetes Paternal Uncle     Social History   Tobacco Use   Smoking status: Former    Types: Cigarettes    Quit date: 05/21/2007    Years since quitting: 14.0   Smokeless tobacco: Never  Vaping Use  Vaping Use: Never used  Substance Use Topics   Alcohol use: No   Drug use: Yes    Types: Marijuana    Home Medications Prior to Admission medications   Medication Sig Start Date End Date Taking? Authorizing Provider  ARIPiprazole (ABILIFY) 10 MG tablet Take 10 mg by mouth daily. 11/16/18   [provider]  cetirizine (ZYRTEC) 10 MG tablet Take 10 mg by mouth daily.    [provider]  FOLIC ACID PO Take by mouth.    [provider]  metFORMIN (GLUCOPHAGE) 500 MG tablet Take 500 mg by mouth 2 (two) times daily. 09/20/20   [provider]  montelukast (SINGULAIR) 10 MG tablet Take 10 mg by mouth daily.    [provider]  pantoprazole (PROTONIX) 40 MG tablet Take 40 mg by mouth daily. 11/13/20   [provider]  Prenatal Vit-Fe Fumarate-FA (PRENATAL VITAMIN PO) Take by mouth.     [provider]  traZODone (DESYREL) 150 MG tablet Take 150 mg by mouth at bedtime. 11/30/20   [provider]  venlafaxine XR (EFFEXOR-XR) 150 MG 24 hr capsule Take 150 mg by mouth daily with breakfast. 09/18/20   [provider]    Allergies    Shellfish allergy  Review of Systems   Review of Systems  Constitutional:  Positive for chills and fever.  HENT:  Positive for congestion. Negative for sore throat and trouble swallowing.   Respiratory:  Positive for cough. Negative for shortness of breath.   Cardiovascular:  Negative for chest pain.  Gastrointestinal:  Negative for abdominal pain, diarrhea, nausea and vomiting.  Genitourinary:  Negative for enuresis.  Musculoskeletal:  Positive for myalgias. Negative for back pain.  Skin:  Negative for rash.  Neurological:  Positive for headaches. Negative for dizziness.  Hematological:  Does not bruise/bleed easily.   Physical Exam Updated Vital Signs BP 135/84 (BP Location: Right Arm)   Pulse 92   Temp 99 F (37.2 C) (Oral)   Resp 20   Ht 5\' 6"  (1.676 m)   Wt (!) 158.8 kg   LMP 06/05/2021   SpO2 97%   BMI 56.49 kg/m   Physical Exam Vitals and nursing note reviewed.  Constitutional:      General: She is not in acute distress.    Appearance: She is not ill-appearing.  HENT:     Head: Normocephalic and atraumatic.     Right Ear: Tympanic membrane, ear canal and external ear normal.     Left Ear: Tympanic membrane, ear canal and external ear normal.     Nose: No congestion.     Mouth/Throat:     Mouth: Mucous membranes are moist.     Pharynx: Oropharynx is clear. No oropharyngeal exudate or posterior oropharyngeal erythema.  Eyes:     Conjunctiva/sclera: Conjunctivae normal.  Cardiovascular:     Rate and Rhythm: Normal rate and regular rhythm.     Pulses: Normal pulses.     Heart sounds: No murmur heard.   No friction rub. No gallop.  Pulmonary:     Effort: No respiratory distress.     Breath  sounds: No wheezing, rhonchi or rales.  Abdominal:     Palpations: Abdomen is soft.     Tenderness: There is no abdominal tenderness. There is no right CVA tenderness or left CVA tenderness.  Musculoskeletal:     Right lower leg: No edema.     Left lower leg: No edema.  Skin:    General:  Skin is warm and dry.  Neurological:     Mental Status: She is alert.  Psychiatric:        Mood and Affect: Mood normal.    ED Results / Procedures / Treatments   Labs (all labs ordered are listed, but only abnormal results are displayed) Labs Reviewed  RESP PANEL BY RT-PCR (FLU A&B, COVID) [id number]    EKG None  Radiology No results found.  Procedures Procedures   Medications Ordered in ED Medications - No data to display  ED Course  I have reviewed the triage vital signs and the nursing notes.  Pertinent labs & imaging results that were available during my care of the patient were reviewed by me and considered in my medical decision making (see chart for details).    MDM Rules/Calculators/A&P                          Initial impression-presents with URI-like symptoms.  She is alert, does not appear acute chest, vital signs reassuring.  Triage obtain respiratory panel  Work-up-respiratory panel negative for acute findings.  Rule out- Low suspicion for systemic infection as patient is nontoxic-appearing, vital signs reassuring, no obvious source infection noted on exam.  Low suspicion for pneumonia as lung sounds are clear bilaterally, will defer imaging at this time.  I have low suspicion for PE as patient denies pleuritic chest pain, shortness of breath, patient is PERC. low suspicion for strep throat as oropharynx was visualized, no erythema or exudates noted.  Low suspicion patient would need  hospitalized due to viral infection or Covid as vital signs reassuring, patient is not in respiratory distress.    Plan-  URI-likely viral infection, will recommend decongestions,  over-the-counter pain medications, staying hydrated, follow with PCP for further evaluation.  Gave strict return precautions.  Vital signs have remained stable, no indication for hospital admission.   Patient given at home care as well strict return precautions.  Patient verbalized that they understood agreed to said plan.  Final Clinical Impression(s) / ED Diagnoses Final diagnoses:  Viral URI with cough    Rx / DC Orders ED Discharge Orders     None        Marcello Fennel, PA-C 06/12/21 Marko Stai    Dorie Rank, MD 06/13/21 1625

## 2021-06-28 ENCOUNTER — Encounter (HOSPITAL_COMMUNITY): Payer: Self-pay | Admitting: *Deleted

## 2021-06-28 ENCOUNTER — Emergency Department (HOSPITAL_COMMUNITY)
Admission: EM | Admit: 2021-06-28 | Discharge: 2021-06-28 | Disposition: A | Discharge status: Home / Self Care | Payer: Medicaid Other | Attending: Emergency Medicine | Admitting: Emergency Medicine

## 2021-06-28 DIAGNOSIS — R110 Nausea: Secondary | ICD-10-CM | POA: Insufficient documentation

## 2021-06-28 DIAGNOSIS — R109 Unspecified abdominal pain: Secondary | ICD-10-CM | POA: Insufficient documentation

## 2021-06-28 DIAGNOSIS — Z87891 Personal history of nicotine dependence: Secondary | ICD-10-CM | POA: Insufficient documentation

## 2021-06-28 LAB — COMPREHENSIVE METABOLIC PANEL
ALT: 27 U/L (ref 0–44)
AST: 22 U/L (ref 15–41)
Albumin: 3.1 g/dL — ABNORMAL LOW (ref 3.5–5.0)
Alkaline Phosphatase: 68 U/L (ref 38–126)
Anion gap: 6 (ref 5–15)
BUN: 14 mg/dL (ref 6–20)
CO2: 24 mmol/L (ref 22–32)
Calcium: 8.4 mg/dL — ABNORMAL LOW (ref 8.9–10.3)
Chloride: 107 mmol/L (ref 98–111)
Creatinine, Ser: 0.94 mg/dL (ref 0.44–1.00)
GFR, Estimated: >60 mL/min (ref >60)
Glucose, Bld: 102 mg/dL — ABNORMAL HIGH (ref 70–99)
Potassium: 3.8 mmol/L (ref 3.5–5.1)
Sodium: 137 mmol/L (ref 135–145)
Total Bilirubin: 0.2 mg/dL — ABNORMAL LOW (ref 0.3–1.2)
Total Protein: 6.2 g/dL — ABNORMAL LOW (ref 6.5–8.1)

## 2021-06-28 LAB — URINALYSIS, ROUTINE W REFLEX MICROSCOPIC
Bilirubin Urine: NEGATIVE
Glucose, UA: NEGATIVE mg/dL
Hgb urine dipstick: NEGATIVE
Ketones, ur: NEGATIVE mg/dL
Leukocytes,Ua: NEGATIVE
Nitrite: NEGATIVE
Protein, ur: NEGATIVE mg/dL
Specific Gravity, Urine: 1.02 (ref 1.005–1.030)
pH: 6 (ref 5.0–8.0)

## 2021-06-28 LAB — CBC
HCT: 40.7 % (ref 36.0–46.0)
Hemoglobin: 12.1 g/dL (ref 12.0–15.0)
MCH: 24.9 pg — ABNORMAL LOW (ref 26.0–34.0)
MCHC: 29.7 g/dL — ABNORMAL LOW (ref 30.0–36.0)
MCV: 83.9 fL (ref 80.0–100.0)
Platelets: 243 10*3/uL (ref 150–400)
RBC: 4.85 MIL/uL (ref 3.87–5.11)
RDW: 15.8 % — ABNORMAL HIGH (ref 11.5–15.5)
WBC: 10.4 10*3/uL (ref 4.0–10.5)
nRBC: 0 % (ref 0.0–0.2)

## 2021-06-28 LAB — I-STAT BETA HCG BLOOD, ED (MC, WL, AP ONLY): I-stat hCG, quantitative: <5 m[IU]/mL (ref <5)

## 2021-06-28 LAB — LIPASE, BLOOD: Lipase: 29 U/L (ref 11–51)

## 2021-06-28 NOTE — Discharge Instructions (Signed)
Follow up with your provider at [facility].  Repeat pregnancy test if you do not start your period

## 2021-06-28 NOTE — ED Triage Notes (Signed)
Abdominal pain with nausea and vomiting for 2 weeks

## 2021-06-28 NOTE — ED Triage Notes (Signed)
Patient requesting blood pregnancy test

## 2021-06-28 NOTE — ED Provider Notes (Signed)
[facility] EMERGENCY DEPARTMENT Provider Note   CSN: [id number] Arrival date & time: 06/28/21  1422     History Chief Complaint  Patient presents with   Abdominal Pain    [name] [name] is a 35 y.o. female.  Pt reports she is concerned that she could be pregnant.  Pt reports she is trying to get pregnant.  Pt has not missed a period.  Pt reports she had a negative home pregnancy.  Pt request a blood test.  Pt complains of breast tenderness, nausea and eating   The history is provided by the patient. No language interpreter was used.  Abdominal Pain Pain quality: aching   Pain radiates to:  Does not radiate Progression:  Worsening Chronicity:  New Relieved by:  Nothing Worsened by:  Nothing Ineffective treatments:  None tried Associated symptoms: no vomiting       Past Medical History:  Diagnosis Date   Abnormal Pap smear    Anxiety    Bipolar 1 disorder (HCC)    Depression    IUD (intrauterine device) in place 04/12/2013   Had IUD inserted 03/29/13 could not feel strings went to ER 9/18 and KUB saw IUD still could not see string, can't see now but seen in US   Kidney infection    Morbid obesity (HCC)    Pregnancy    UTI (urinary tract infection)     Patient Active Problem List   Diagnosis Date Noted   Morbid obesity (HCC)    BMI 50.0-59.9, adult (HCC) 11/30/2018   Weight loss counseling, encounter for 11/30/2018   Encounter for IUD insertion 09/01/2018   Menorrhagia with regular cycle 05/04/2018   Persistent headaches 08/03/2013   Obesity, morbid, BMI 50 or higher (HCC) 08/03/2013   Snoring 08/03/2013   Chlamydia 01/18/2013   DUB (dysfunctional uterine bleeding) 01/13/2013   Pyelonephritis 07/18/2011   Obesity, Class III, BMI 40-49.9 (morbid obesity) (HCC) 07/18/2011    Past Surgical History:  Procedure Laterality Date   CESAREAN SECTION     HYSTEROSCOPY N/A 01/09/2021   Procedure: HYSTEROSCOPY;  Surgeon: Myna Hidalgo, DO;  Location: [facility];  Service:  Gynecology;  Laterality: N/A;   IUD REMOVAL N/A 01/09/2021   Procedure: INTRAUTERINE DEVICE (IUD) REMOVAL;  Surgeon: Myna Hidalgo, DO;  Location: [facility];  Service: Gynecology;  Laterality: N/A;   TOOTH EXTRACTION       OB History     Gravida  4   Para  2   Term  1   Preterm  1   AB  2   Living  2      SAB  2   IAB  0   Ectopic  0   Multiple  0   Live Births  2           Family History  Problem Relation Age of Onset   Hypertension Father    Diabetes Father    Congestive Heart Failure Father    Hypertension Paternal Grandmother    Diabetes Paternal Grandmother    Diabetes Paternal Grandfather    Diabetes Paternal Aunt    Diabetes Paternal Uncle     Social History   Tobacco Use   Smoking status: Former    Types: Cigarettes    Quit date: 05/21/2007    Years since quitting: 14.1   Smokeless tobacco: Never  Vaping Use   Vaping Use: Never used  Substance Use Topics   Alcohol use: No   Drug use: Yes  Types: Marijuana    Home Medications Prior to Admission medications   Medication Sig Start Date End Date Taking? Authorizing Provider  ARIPiprazole (ABILIFY) 10 MG tablet Take 10 mg by mouth daily. 11/16/18   [provider]  cetirizine (ZYRTEC) 10 MG tablet Take 10 mg by mouth daily.    [provider]  FOLIC ACID PO Take by mouth.    [provider]  metFORMIN (GLUCOPHAGE) 500 MG tablet Take 500 mg by mouth 2 (two) times daily. 09/20/20   [provider]  montelukast (SINGULAIR) 10 MG tablet Take 10 mg by mouth daily.    [provider]  pantoprazole (PROTONIX) 40 MG tablet Take 40 mg by mouth daily. 11/13/20   [provider]  Prenatal Vit-Fe Fumarate-FA (PRENATAL VITAMIN PO) Take by mouth.    [provider]  traZODone (DESYREL) 150 MG tablet Take 150 mg by mouth at bedtime. 11/30/20   [provider]  venlafaxine XR (EFFEXOR-XR) 150 MG 24 hr capsule Take 150 mg by mouth daily  with breakfast. 09/18/20   [provider]    Allergies    Shellfish allergy  Review of Systems   Review of Systems  Gastrointestinal:  Positive for abdominal pain. Negative for vomiting.  All other systems reviewed and are negative.  Physical Exam Updated Vital Signs BP (!) 152/93 (BP Location: Right Arm)   Pulse 89   Temp 99.1 F (37.3 C) (Oral)   Resp (!) 22   LMP 06/05/2021   SpO2 99%   Physical Exam Vitals and nursing note reviewed.  Constitutional:      Appearance: She is well-developed.  HENT:     Head: Normocephalic.  Cardiovascular:     Rate and Rhythm: Normal rate and regular rhythm.  Pulmonary:     Effort: Pulmonary effort is normal.  Abdominal:     General: There is no distension.     Palpations: Abdomen is soft.  Musculoskeletal:        General: Normal range of motion.     Cervical back: Normal range of motion.  Neurological:     Mental Status: She is alert and oriented to person, place, and time.    ED Results / Procedures / Treatments   Labs (all labs ordered are listed, but only abnormal results are displayed) Labs Reviewed  COMPREHENSIVE METABOLIC PANEL - Abnormal; Notable for the following components:      Result Value   Glucose, Bld 102 (*)    Calcium 8.4 (*)    Total Protein 6.2 (*)    Albumin 3.1 (*)    Total Bilirubin 0.2 (*)    All other components within normal limits  CBC - Abnormal; Notable for the following components:   MCH 24.9 (*)    MCHC 29.7 (*)    RDW 15.8 (*)    All other components within normal limits  LIPASE, BLOOD  URINALYSIS, ROUTINE W REFLEX MICROSCOPIC  I-STAT BETA HCG BLOOD, ED ([facility], [facility], [facility] ONLY)    EKG None  Radiology No results found.  Procedures Procedures   Medications Ordered in ED Medications - No data to display  ED Course  I have reviewed the triage vital signs and the nursing notes.  Pertinent labs & imaging results that were available during my care of the patient were reviewed by  me and considered in my medical decision making (see chart for details).    MDM Rules/Calculators/A&P  MDM:  Pregnancy test is negative.  Pt advised to repeat test in 1 week if she does not start her period.  Pt advised to follow up with her OB/gyn Final Clinical Impression(s) / ED Diagnoses Final diagnoses:  Nausea    Rx / DC Orders ED Discharge Orders     None     An After Visit Summary was printed and given to the patient.    Elson Areas, PA-C 06/28/21 1927    Rozelle Logan, DO 06/28/21 2343

## 2021-07-03 ENCOUNTER — Encounter: Payer: Self-pay | Admitting: Adult Health

## 2021-07-03 ENCOUNTER — Other Ambulatory Visit: Payer: Self-pay

## 2021-07-03 ENCOUNTER — Ambulatory Visit (INDEPENDENT_AMBULATORY_CARE_PROVIDER_SITE_OTHER): Payer: Self-pay | Admitting: Adult Health

## 2021-07-03 VITALS — BP 123/76 | HR 87 | Ht 67.0 in | Wt 366.8 lb

## 2021-07-03 DIAGNOSIS — R110 Nausea: Secondary | ICD-10-CM | POA: Insufficient documentation

## 2021-07-03 DIAGNOSIS — N926 Irregular menstruation, unspecified: Secondary | ICD-10-CM | POA: Insufficient documentation

## 2021-07-03 DIAGNOSIS — Z319 Encounter for procreative management, unspecified: Secondary | ICD-10-CM | POA: Insufficient documentation

## 2021-07-03 DIAGNOSIS — Z3202 Encounter for pregnancy test, result negative: Secondary | ICD-10-CM

## 2021-07-03 LAB — POCT URINE PREGNANCY: Preg Test, Ur: NEGATIVE

## 2021-07-03 MED ORDER — PROMETHAZINE HCL 25 MG PO TABS: 25.0000 mg | ORAL_TABLET | Freq: Four times a day (QID) | ORAL | 1 refills | Status: DC | PRN

## 2021-07-03 NOTE — Progress Notes (Signed)
°  Subjective:     Patient ID: [name] [name], female   DOB: 04/12/1986, 35 y.o.   MRN: [id number]  HPI [name] [name] is a 35 year old black female,single, [id number] in complaining of having felt pregnant, had nausea and vomiting and breast tenderness, had not missed a period. Period started 07/01/21. She went to ER 06/28/21. Labs normal. She wants to be pregnant.  Lab Results  Component Value Date   DIAGPAP  12/26/2020    - Negative for intraepithelial lesion or malignancy (NILM)   HPV NOT DETECTED 05/04/2018   HPVHIGH Negative 12/26/2020   PCP is [facility].  Review of Systems +nausea and vomiting +breast tenderness at nipples Reviewed past medical,surgical, social and family history. Reviewed medications and allergies.     Objective:   Physical Exam BP 123/76 (BP Location: Left Arm, Patient Position: Sitting, Cuff Size: Large)    Pulse 87    Ht 5\' 7"  (1.702 m)    Wt (!) 366 lb 12.8 oz (166.4 kg)    LMP 07/01/2021 (Exact Date)    BMI 57.45 kg/m     UPT is negative  Skin warm and dry.Pelvic: external genitalia is normal in appearance no lesions, vagina: +period blood,urethra has no lesions or masses noted, cervix:smooth and bulbous, uterus: normal size, shape and contour, non tender, no masses felt, adnexa: no masses or tenderness noted. Bladder is non tender and no masses felt.   Upstream - 07/03/21 1213       Pregnancy Intention Screening   Does the patient want to become pregnant in the next year? Yes    Does the patient's partner want to become pregnant in the next year? Yes    Would the patient like to discuss contraceptive options today? No      Contraception Wrap Up   Current Method Pregnant/Seeking Pregnancy    End Method Pregnant/Seeking Pregnancy    Contraception Counseling Provided No            Examination chaperoned by 07/05/21 RN  Assessment:     1. Irregular periods   2. Nausea Will rx phenergan Meds ordered this encounter  Medications   promethazine  (PHENERGAN) 25 MG tablet    Sig: Take 1 tablet (25 mg total) by mouth every 6 (six) hours as needed for nausea or vomiting.    Dispense:  30 tablet    Refill:  1    Order Specific Question:   Supervising Provider    Answer:   Clint Bolder, LUTHER H [[id number]]     3. Patient desires pregnancy Take OTC PNC Discussed timing of sex    Plan:    Follow up prn

## 2021-07-22 NOTE — L&D Delivery Note (Signed)
Delivery Note Pt progressed steadily and was noted to be C/C/+1 w/c/o pressure.  After a 15 minute 2nd stage, at 9:30 PM a viable female was delivered via Vaginal, Spontaneous (Presentation:ROA, rotated to LOA by grasping posterior axilla).  APGAR:8/9 , ; weight pending. After 1 minute, the cord was clamped and cut. 20 units of pitocin diluted in 500cc LR was infused rapidly IV.  The placenta separated spontaneously and delivered via CCT and maternal pushing effort.  It was inspected and appears to be intact with a 3 VC. Anethesia: Epidural Episiotomy: None Lacerations:  1st degree Suture Repair:  Est. Blood Loss (mL):  129  Mom to postpartum.  Baby to Couplet care / Skin to Skin.  [name] [name] 05/09/2022, 10:06 PM

## 2021-09-18 ENCOUNTER — Encounter: Payer: Self-pay | Admitting: *Deleted

## 2021-09-18 ENCOUNTER — Encounter: Payer: Self-pay | Admitting: Obstetrics & Gynecology

## 2021-09-18 ENCOUNTER — Other Ambulatory Visit: Payer: Self-pay

## 2021-09-18 ENCOUNTER — Ambulatory Visit (INDEPENDENT_AMBULATORY_CARE_PROVIDER_SITE_OTHER): Payer: Medicaid Other | Admitting: *Deleted

## 2021-09-18 VITALS — BP 117/86 | HR 103 | Ht 65.0 in | Wt 371.0 lb

## 2021-09-18 DIAGNOSIS — N910 Primary amenorrhea: Secondary | ICD-10-CM | POA: Diagnosis not present

## 2021-09-18 DIAGNOSIS — Z3481 Encounter for supervision of other normal pregnancy, first trimester: Secondary | ICD-10-CM

## 2021-09-18 DIAGNOSIS — Z3A01 Less than 8 weeks gestation of pregnancy: Secondary | ICD-10-CM | POA: Diagnosis not present

## 2021-09-18 DIAGNOSIS — Z3201 Encounter for pregnancy test, result positive: Secondary | ICD-10-CM | POA: Diagnosis not present

## 2021-09-18 LAB — POCT URINE PREGNANCY: Preg Test, Ur: POSITIVE — AB

## 2021-09-18 NOTE — Progress Notes (Signed)
° °  NURSE VISIT- PREGNANCY CONFIRMATION   SUBJECTIVE:  [name] [name] is a 36 y.o. [id number] female at [redacted]w[redacted]d by certain LMP of Patient's last menstrual period was 08/20/2021. Here for pregnancy confirmation.  Home pregnancy test: positive x 2.   She reports no complaints.  She is taking prenatal vitamins.    OBJECTIVE:  BP 117/86 (BP Location: Left Arm, Patient Position: Sitting, Cuff Size: Large)    Pulse (!) 103    Ht 5\' 5"  (1.651 m)    Wt (!) 371 lb (168.3 kg)    LMP 08/20/2021    BMI 61.74 kg/m   Appears well, in no apparent distress  Results for orders placed or performed in visit on 09/18/21 (from the past 24 hour(s))  POCT urine pregnancy   Collection Time: 09/18/21  4:28 PM  Result Value Ref Range   Preg Test, Ur Positive (A) Negative    ASSESSMENT: Positive pregnancy test, [redacted]w[redacted]d by LMP    PLAN: Schedule for dating ultrasound in 4 weeks Prenatal vitamins: continue   Nausea medicines: not currently needed   OB packet given: Yes  Levy Pupa  09/18/2021 4:40 PM

## 2021-09-21 ENCOUNTER — Telehealth: Payer: Self-pay | Admitting: Women's Health

## 2021-09-21 NOTE — Telephone Encounter (Signed)
Patient wants to know if it is safe to use alcohol on face during pregnancy.  ?

## 2021-09-21 NOTE — Telephone Encounter (Signed)
Pt was advised alcohol is drying and irritating so we don't recommend using alcohol on face. Pt voiced understanding. JSY ?

## 2021-09-25 ENCOUNTER — Encounter (HOSPITAL_COMMUNITY): Payer: Self-pay | Admitting: *Deleted

## 2021-09-25 ENCOUNTER — Emergency Department (HOSPITAL_COMMUNITY): Payer: Medicaid Other

## 2021-09-25 ENCOUNTER — Emergency Department (HOSPITAL_COMMUNITY)
Admission: EM | Admit: 2021-09-25 | Discharge: 2021-09-25 | Disposition: A | Discharge status: Home / Self Care | Payer: Medicaid Other | Attending: Emergency Medicine | Admitting: Emergency Medicine

## 2021-09-25 DIAGNOSIS — N9489 Other specified conditions associated with female genital organs and menstrual cycle: Secondary | ICD-10-CM | POA: Insufficient documentation

## 2021-09-25 DIAGNOSIS — R109 Unspecified abdominal pain: Secondary | ICD-10-CM

## 2021-09-25 DIAGNOSIS — Z3490 Encounter for supervision of normal pregnancy, unspecified, unspecified trimester: Secondary | ICD-10-CM

## 2021-09-25 DIAGNOSIS — Z3A01 Less than 8 weeks gestation of pregnancy: Secondary | ICD-10-CM | POA: Diagnosis not present

## 2021-09-25 DIAGNOSIS — Z2913 Encounter for prophylactic Rho(D) immune globulin: Secondary | ICD-10-CM | POA: Diagnosis not present

## 2021-09-25 DIAGNOSIS — O200 Threatened abortion: Secondary | ICD-10-CM

## 2021-09-25 DIAGNOSIS — Z7984 Long term (current) use of oral hypoglycemic drugs: Secondary | ICD-10-CM | POA: Diagnosis not present

## 2021-09-25 DIAGNOSIS — O26891 Other specified pregnancy related conditions, first trimester: Secondary | ICD-10-CM | POA: Diagnosis present

## 2021-09-25 DIAGNOSIS — O26899 Other specified pregnancy related conditions, unspecified trimester: Secondary | ICD-10-CM

## 2021-09-25 LAB — COMPREHENSIVE METABOLIC PANEL
ALT: 32 U/L (ref 0–44)
AST: 26 U/L (ref 15–41)
Albumin: 3.7 g/dL (ref 3.5–5.0)
Alkaline Phosphatase: 59 U/L (ref 38–126)
Anion gap: 7 (ref 5–15)
BUN: 12 mg/dL (ref 6–20)
CO2: 22 mmol/L (ref 22–32)
Calcium: 9 mg/dL (ref 8.9–10.3)
Chloride: 106 mmol/L (ref 98–111)
Creatinine, Ser: 0.88 mg/dL (ref 0.44–1.00)
GFR, Estimated: >60 mL/min (ref >60)
Glucose, Bld: 96 mg/dL (ref 70–99)
Potassium: 4.1 mmol/L (ref 3.5–5.1)
Sodium: 135 mmol/L (ref 135–145)
Total Bilirubin: 0.5 mg/dL (ref 0.3–1.2)
Total Protein: 7 g/dL (ref 6.5–8.1)

## 2021-09-25 LAB — CBC
HCT: 41.9 % (ref 36.0–46.0)
Hemoglobin: 12.6 g/dL (ref 12.0–15.0)
MCH: 25.5 pg — ABNORMAL LOW (ref 26.0–34.0)
MCHC: 30.1 g/dL (ref 30.0–36.0)
MCV: 84.8 fL (ref 80.0–100.0)
Platelets: 257 10*3/uL (ref 150–400)
RBC: 4.94 MIL/uL (ref 3.87–5.11)
RDW: 16.8 % — ABNORMAL HIGH (ref 11.5–15.5)
WBC: 8.1 10*3/uL (ref 4.0–10.5)
nRBC: 0 % (ref 0.0–0.2)

## 2021-09-25 LAB — URINALYSIS, ROUTINE W REFLEX MICROSCOPIC
Bilirubin Urine: NEGATIVE
Glucose, UA: NEGATIVE mg/dL
Hgb urine dipstick: NEGATIVE
Ketones, ur: 20 mg/dL — AB
Leukocytes,Ua: NEGATIVE
Nitrite: NEGATIVE
Protein, ur: NEGATIVE mg/dL
Specific Gravity, Urine: 1.02 (ref 1.005–1.030)
pH: 5 (ref 5.0–8.0)

## 2021-09-25 LAB — LIPASE, BLOOD: Lipase: 25 U/L (ref 11–51)

## 2021-09-25 LAB — HCG, QUANTITATIVE, PREGNANCY: hCG, Beta Chain, Quant, S: 11030 m[IU]/mL — ABNORMAL HIGH (ref <5)

## 2021-09-25 LAB — POC URINE PREG, ED: Preg Test, Ur: POSITIVE — AB

## 2021-09-25 MED ORDER — RHO D IMMUNE GLOBULIN 1500 UNIT/2ML IJ SOSY
300.0000 ug | PREFILLED_SYRINGE | Freq: Once | INTRAMUSCULAR | Status: AC
  Administered 2021-09-25: 300 ug via INTRAMUSCULAR

## 2021-09-25 MED ORDER — DOXYLAMINE-PYRIDOXINE 10-10 MG PO TBEC
2.0000 | DELAYED_RELEASE_TABLET | Freq: Every evening | ORAL | 0 refills | Status: DC | PRN
Start: 2021-09-25 — End: 2021-10-22

## 2021-09-25 NOTE — ED Triage Notes (Signed)
Abdominal pain with cramping, [redacted] weeks pregnant ?

## 2021-09-25 NOTE — ED Notes (Signed)
Patient transported to Ultrasound 

## 2021-09-25 NOTE — ED Provider Notes (Cosign Needed)
[facility] EMERGENCY DEPARTMENT Provider Note   CSN: [id number] Arrival date & time: 09/25/21  1157     History  No chief complaint on file.   [name] [name] is a 36 y.o. female with a history including DU B, who is currently [redacted] weeks pregnant by dates, LMP being 08/20/2021 presenting for evaluation of low pelvic cramping which has been present for approximately 1 week and has worsened today and is in association with vaginal spotting which started about 2 days ago.  She does have concerns that she may be further along than 4 weeks in her pregnancy since she reports with a prior pregnancy she was actually months further along than was originally estimated by LMP.  Her last menstrual period was very light and only lasted 3 days and she endorses having nausea for the past 2 months, hence concerns for being further along.  She is currently taking Phenergan for the nausea.  She denies weakness, dizziness.  Her bleeding is described as spotting only with wiping.  She is currently on a prenatal vitamin.  She is scheduled [facility] in [facility] weeks.  Per patient and chart, she is O- blood type/Rh.  The history is provided by the patient.      Home Medications Prior to Admission medications   Medication Sig Start Date End Date Taking? Authorizing Provider  Doxylamine-Pyridoxine 10-10 MG TBEC Take 2 tablets by mouth at bedtime as needed. 09/25/21  Yes Berania Peedin, Raynelle Fanning, PA-C  ARIPiprazole (ABILIFY) 10 MG tablet Take 10 mg by mouth daily. Patient not taking: Reported on 09/18/2021 11/16/18   [provider]  cetirizine (ZYRTEC) 10 MG tablet Take 10 mg by mouth daily.    [provider]  FOLIC ACID PO Take by mouth.    [provider]  MELATONIN PO Take by mouth.    [provider]  metFORMIN (GLUCOPHAGE) 500 MG tablet Take 500 mg by mouth 2 (two) times daily. 09/20/20   [provider]  montelukast (SINGULAIR) 10 MG tablet Take 10 mg by  mouth daily.    [provider]  Prenatal Vit-Fe Fumarate-FA (PRENATAL VITAMIN PO) Take by mouth.    [provider]  traZODone (DESYREL) 150 MG tablet Take 150 mg by mouth at bedtime. Patient not taking: Reported on 09/18/2021 11/30/20   [provider]  venlafaxine XR (EFFEXOR-XR) 150 MG 24 hr capsule Take 150 mg by mouth daily with breakfast. Patient not taking: Reported on 09/18/2021 09/18/20   [provider]      Allergies    Shellfish allergy    Review of Systems   Review of Systems  Constitutional:  Negative for chills and fever.  HENT:  Negative for congestion and sore throat.   Eyes: Negative.   Respiratory:  Negative for chest tightness and shortness of breath.   Cardiovascular:  Negative for chest pain.  Gastrointestinal:  Positive for nausea and vomiting. Negative for abdominal pain.  Genitourinary:  Positive for pelvic pain and vaginal bleeding. Negative for dysuria and flank pain.  Musculoskeletal:  Negative for arthralgias, joint swelling and neck pain.  Skin: Negative.  Negative for rash and wound.  Neurological:  Negative for dizziness, weakness, light-headedness, numbness and headaches.  Psychiatric/Behavioral: Negative.     Physical Exam Updated Vital Signs BP 124/79    Pulse 84    Temp 98.1 F (36.7 C) (Oral)    Resp 17    Ht 5\' 5"  (1.651 m)  Wt (!) 166.1 kg    LMP 08/20/2021    SpO2 100%    BMI 60.94 kg/m  Physical Exam Vitals and nursing note reviewed.  Constitutional:      Appearance: She is well-developed.  HENT:     Head: Normocephalic and atraumatic.  Eyes:     Conjunctiva/sclera: Conjunctivae normal.  Cardiovascular:     Rate and Rhythm: Normal rate and regular rhythm.     Heart sounds: Normal heart sounds.  Pulmonary:     Effort: Pulmonary effort is normal.     Breath sounds: Normal breath sounds. No wheezing.  Abdominal:     General: Bowel sounds are normal.     Palpations: Abdomen is soft.     Tenderness:  There is no abdominal tenderness.  Musculoskeletal:        General: Normal range of motion.     Cervical back: Normal range of motion.  Skin:    General: Skin is warm and dry.  Neurological:     Mental Status: She is alert.    ED Results / Procedures / Treatments   Labs (all labs ordered are listed, but only abnormal results are displayed) Labs Reviewed  HCG, QUANTITATIVE, PREGNANCY - Abnormal; Notable for the following components:      Result Value   hCG, Beta Chain, Quant, S 11,030 (*)    All other components within normal limits  CBC - Abnormal; Notable for the following components:   MCH 25.5 (*)    RDW 16.8 (*)    All other components within normal limits  URINALYSIS, ROUTINE W REFLEX MICROSCOPIC - Abnormal; Notable for the following components:   Ketones, ur 20 (*)    All other components within normal limits  POC URINE PREG, ED - Abnormal; Notable for the following components:   Preg Test, Ur Positive (*)    All other components within normal limits  LIPASE, BLOOD  COMPREHENSIVE METABOLIC PANEL  RH IG WORKUP (INCLUDES ABO/RH)    EKG None  Radiology US OB LESS THAN 14 WEEKS WITH OB TRANSVAGINAL  Result Date: 09/25/2021 CLINICAL DATA:  Abdominal pain, cramping, positive pregnancy test EXAM: OBSTETRIC <14 WK [place] AND TRANSVAGINAL OB US TECHNIQUE: Both transabdominal and transvaginal ultrasound examinations were performed for complete evaluation of the gestation as well as the maternal uterus, adnexal regions, and pelvic cul-de-sac. Transvaginal technique was performed to assess early pregnancy. COMPARISON:  None. FINDINGS: Intrauterine gestational sac: Single Yolk sac:  Visualized. Embryo:  Not Visualized. Cardiac Activity: Not Visualized. MSD: 10.5 mm   5 w   6 d Subchorionic hemorrhage:  None visualized. Maternal uterus/adnexae: Bilateral ovaries within normal limits. Trace fluid within the cul-de-sac. IMPRESSION: Early intrauterine gestational sac containing yolk sac. No  embryo is visible at this time. Recommend follow-up quantitative B-HCG levels and follow-up US in 14 days to assess viability. Electronically Signed   By: Duanne Guess D.O.   On: 09/25/2021 15:58    Procedures Procedures    Medications Ordered in ED Medications - No data to display  ED Course/ Medical Decision Making/ A&P                           Medical Decision Making Pt with low pelvic crarmping with blood tinge on toilet tissue, known pregnant with LMP 08/20/21.  Established with [facility].  N/v somewhat controlled with phenergan.  Diclegis prescribed for nausea and vomiting.  Recommend close follow-up with [facility] as needed for  ongoing symptoms.  Amount and/or Complexity of Data Reviewed Labs: ordered.    Details: Labs revealing for hcg quant of [address] which is consistent with her [place] today suggesting intrauterine pregnancy [redacted]w[redacted]d.  Pt is Rh -, small amount of vaginal spotting which may require rhogam.Labs otherwise stable no evidence of electrolyte abnormality, LFTs and lipase are normal, urine is clean without significant evidence for dehydration. Radiology: ordered.    Details: [place] -interuterine preg, [redacted]w[redacted]d.  Yolk sac, no embryo visible. Discussion of management or test interpretation with external provider(s): Dr. Despina Hidden.    Pt is scheduled for [place] on 3/28.  Results discussed with Dr. Despina Hidden who recommends RhoGAM here and planned follow-up with them as scheduled on March 28 at which time she will have a repeat ultrasound.    Risk Prescription drug management.           Final Clinical Impression(s) / ED Diagnoses Final diagnoses:  Intrauterine pregnancy  Threatened miscarriage in early pregnancy    Rx / DC Orders ED Discharge Orders          Ordered    Doxylamine-Pyridoxine 10-10 MG TBEC  At bedtime PRN        09/25/21 1725              Burgess Amor, PA-C 09/25/21 1727

## 2021-09-25 NOTE — Discharge Instructions (Signed)
As discussed it can be normal to see a little bit of blood early in pregnancy, but I also cannot rule out the possibility that this is an early miscarriage.  Plan follow-up care with [facility] is planned.  I have prescribed likely just to help you with your morning sickness. ?

## 2021-09-25 NOTE — ED Notes (Signed)
Lab notified of need for Rhogam injection. ?

## 2021-09-26 LAB — RH IG WORKUP (INCLUDES ABO/RH)
Gestational Age(Wks): 5
Unit division: 0

## 2021-10-10 ENCOUNTER — Telehealth: Payer: Self-pay | Admitting: Obstetrics & Gynecology

## 2021-10-10 NOTE — Telephone Encounter (Signed)
Patient called stating that she is early pregnant and she went to the ED on 09/25/2021, she states that they told her she needed an HCG blood work done. Pat ?

## 2021-10-10 NOTE — Telephone Encounter (Signed)
Returned patient's call.  Advised she could get a pedicure and based on ED note, consult with Dr Elonda Husky was done and he advised to keep u/s appt on 3/28 as scheduled.  Pt denies bleeding at this time.  Verbalized understanding and appt for 3/28 confirmed.  ?

## 2021-10-10 NOTE — Telephone Encounter (Signed)
Patient would [place] like to know if she could get a pedicure. Please contact pt ?

## 2021-10-15 ENCOUNTER — Other Ambulatory Visit: Payer: Self-pay | Admitting: Obstetrics & Gynecology

## 2021-10-15 DIAGNOSIS — O3680X0 Pregnancy with inconclusive fetal viability, not applicable or unspecified: Secondary | ICD-10-CM

## 2021-10-16 ENCOUNTER — Ambulatory Visit (INDEPENDENT_AMBULATORY_CARE_PROVIDER_SITE_OTHER): Payer: Medicaid Other

## 2021-10-16 ENCOUNTER — Other Ambulatory Visit: Payer: Self-pay

## 2021-10-16 DIAGNOSIS — O3680X0 Pregnancy with inconclusive fetal viability, not applicable or unspecified: Secondary | ICD-10-CM | POA: Diagnosis not present

## 2021-10-16 NOTE — Progress Notes (Signed)
[place] 8+1 wks,single IUP with yolk sac,CRL 17.75 mm,FHR 157 bpm,normal ovaries ?

## 2021-10-22 ENCOUNTER — Other Ambulatory Visit: Payer: Self-pay | Admitting: Women's Health

## 2021-10-22 ENCOUNTER — Other Ambulatory Visit: Payer: Self-pay | Admitting: *Deleted

## 2021-10-22 MED ORDER — DOXYLAMINE-PYRIDOXINE 10-10 MG PO TBEC: DELAYED_RELEASE_TABLET | ORAL | 6 refills | Status: DC

## 2021-10-23 ENCOUNTER — Other Ambulatory Visit: Payer: Self-pay

## 2021-10-23 DIAGNOSIS — Z3A09 9 weeks gestation of pregnancy: Secondary | ICD-10-CM | POA: Diagnosis not present

## 2021-10-23 DIAGNOSIS — J302 Other seasonal allergic rhinitis: Secondary | ICD-10-CM | POA: Diagnosis not present

## 2021-10-23 DIAGNOSIS — I10 Essential (primary) hypertension: Secondary | ICD-10-CM | POA: Diagnosis not present

## 2021-10-23 DIAGNOSIS — O26891 Other specified pregnancy related conditions, first trimester: Secondary | ICD-10-CM | POA: Diagnosis present

## 2021-10-24 ENCOUNTER — Emergency Department (HOSPITAL_COMMUNITY)
Admission: EM | Admit: 2021-10-24 | Discharge: 2021-10-24 | Disposition: A | Discharge status: Home / Self Care | Payer: Medicaid Other | Attending: Emergency Medicine | Admitting: Emergency Medicine

## 2021-10-24 ENCOUNTER — Other Ambulatory Visit: Payer: Self-pay

## 2021-10-24 ENCOUNTER — Encounter (HOSPITAL_COMMUNITY): Payer: Self-pay | Admitting: Emergency Medicine

## 2021-10-24 DIAGNOSIS — R519 Headache, unspecified: Secondary | ICD-10-CM

## 2021-10-24 DIAGNOSIS — I10 Essential (primary) hypertension: Secondary | ICD-10-CM

## 2021-10-24 DIAGNOSIS — J302 Other seasonal allergic rhinitis: Secondary | ICD-10-CM

## 2021-10-24 MED ORDER — CETIRIZINE HCL 10 MG PO TABS: 10.0000 mg | ORAL_TABLET | Freq: Every day | ORAL | 0 refills | Status: DC

## 2021-10-24 MED ORDER — FLUTICASONE PROPIONATE 50 MCG/ACT NA SUSP: 1.0000 | Freq: Every day | NASAL | 2 refills | Status: DC

## 2021-10-24 MED ORDER — ACETAMINOPHEN 500 MG PO TABS
1000.0000 mg | ORAL_TABLET | Freq: Once | ORAL | Status: AC
  Administered 2021-10-24: 1000 mg via ORAL
  Filled 2021-10-24: qty 2

## 2021-10-24 NOTE — ED Provider Notes (Signed)
?[facility] EMERGENCY DEPARTMENT ?Provider Note ? ? ?CSN: [id number] ?Arrival date & time: 10/23/21  2343 ? ?  ? ?History ? ?Chief Complaint  ?Patient presents with  ? Headache  ? ? ?[name] [name] is a 36 y.o. female. ? ?HPI ? ?  ? ?This is a 36 year old G5, P2 female currently [redacted] weeks pregnant who presents with headache and sinus pressure.  Patient reports 4-day history of headache.  She has been taking ibuprofen and Tylenol with minimal relief.  She states it is frontal and throbbing.  She reports worsening nasal congestion and intermittent sore throat.  She has also had some sneezing and watery eyes.  She normally takes Zyrtec but has not taken that recently.  She has not had any fevers.  No cough, shortness of breath, chest pain.  Denies vaginal bleeding, cramping, pregnancy related problems. ? ?Chart reviewed.  Had ultrasound on 3/28 with an 8-week 1 day pregnancy. ? ?Home Medications ?Prior to Admission medications   ?Medication Sig Start Date End Date Taking? Authorizing Provider  ?cetirizine (ZYRTEC ALLERGY) 10 MG tablet Take 1 tablet (10 mg total) by mouth daily. 10/24/21  Yes Josmar Messimer, Mayer Masker, MD  ?Doxylamine-Pyridoxine (DICLEGIS) 10-10 MG TBEC 2 tabs q hs, if sx persist add 1 tab q am on day 3, if sx persist add 1 tab q afternoon on day 4 10/22/21   Cheral Marker, CNM  ?fluticasone (FLONASE) 50 MCG/ACT nasal spray Place 1 spray into both nostrils daily. 10/24/21  Yes Faris Coolman, Mayer Masker, MD  ?ARIPiprazole (ABILIFY) 10 MG tablet Take 10 mg by mouth daily. ?Patient not taking: Reported on 09/18/2021 11/16/18   [provider]  ?cetirizine (ZYRTEC) 10 MG tablet Take 10 mg by mouth daily.    [provider]  ?FOLIC ACID PO Take by mouth.    [provider]  ?MELATONIN PO Take by mouth.    [provider]  ?metFORMIN (GLUCOPHAGE) 500 MG tablet Take 500 mg by mouth 2 (two) times daily. 09/20/20   [provider]  ?montelukast (SINGULAIR) 10 MG tablet Take 10 mg by mouth  daily.    [provider]  ?Prenatal Vit-Fe Fumarate-FA (PRENATAL VITAMIN PO) Take by mouth.    [provider]  ?traZODone (DESYREL) 150 MG tablet Take 150 mg by mouth at bedtime. ?Patient not taking: Reported on 09/18/2021 11/30/20   [provider]  ?venlafaxine XR (EFFEXOR-XR) 150 MG 24 hr capsule Take 150 mg by mouth daily with breakfast. ?Patient not taking: Reported on 09/18/2021 09/18/20   [provider]  ?   ? ?Allergies    ?Shellfish allergy   ? ?Review of Systems   ?Review of Systems  ?Constitutional:  Negative for fever.  ?HENT:  Positive for rhinorrhea, sinus pressure, sneezing and sore throat.   ?Neurological:  Positive for headaches.  ?All other systems reviewed and are negative. ? ?Physical Exam ?Updated Vital Signs ?BP (!) 148/88 (BP Location: Right Arm)   Pulse 100   Temp (!) 97.5 ?F (36.4 ?C) (Oral)   Resp 20   Ht 1.651 m (5\' 5" )   Wt (!) 166.9 kg   LMP 08/20/2021   SpO2 100%   BMI 61.24 kg/m?  ?Physical Exam ?Vitals and nursing note reviewed.  ?Constitutional:   ?   Appearance: She is well-developed. She is obese. She is not ill-appearing.  ?HENT:  ?   Head: Normocephalic and atraumatic.  ?   Nose:  ?   Comments: Swollen nasal turbinates ?  Mouth/Throat:  ?   Mouth: Mucous membranes are moist.  ?   Comments: Postnasal drip noted ?Eyes:  ?   Pupils: Pupils are equal, round, and reactive to light.  ?Cardiovascular:  ?   Rate and Rhythm: Normal rate and regular rhythm.  ?   Heart sounds: Normal heart sounds.  ?Pulmonary:  ?   Effort: Pulmonary effort is normal. No respiratory distress.  ?Abdominal:  ?   Palpations: Abdomen is soft.  ?Musculoskeletal:  ?   Cervical back: Normal range of motion and neck supple.  ?Skin: ?   General: Skin is warm and dry.  ?Neurological:  ?   Mental Status: She is alert and oriented to person, place, and time.  ?Psychiatric:     ?   Mood and Affect: Mood normal.  ? ? ?ED Results / Procedures / Treatments   ?Labs ?(all labs  ordered are listed, but only abnormal results are displayed) ?Labs Reviewed - No data to display ? ?EKG ?None ? ?Radiology ?No results found. ? ?Procedures ?Procedures  ? ? ?Medications Ordered in ED ?Medications  ?acetaminophen (TYLENOL) tablet 1,000 mg (has no administration in time range)  ? ? ?ED Course/ Medical Decision Making/ A&P ?  ?                        ?Medical Decision Making ?Risk ?OTC drugs. ? ? ?This patient presents to the ED for concern of headache and upper respiratory symptoms, this involves an extensive number of treatment options, and is a complaint that carries with it a high risk of complications and morbidity.  The differential diagnosis includes viral etiology, seasonal allergies, allergic rhinitis, tension headache ? ?MDM:   ? ?This is a 36 year old female who presents with headache and upper respiratory symptoms.  She is nontoxic.  She is currently [redacted] weeks pregnant.  Vital signs notable for blood pressure 148/88.  She has a history of primary hypertension.  From a gestational standpoint, she is in early pregnancy and doubt this is pregnancy related.  She has no pregnancy related complaints.  Given her history, highly suggestive of allergic rhinitis and sinus headache.  She is not febrile.  Do not feel antibiotics are indicated.  She has not been taking her Zyrtec.  This is safe in pregnancy.  Also recommend Flonase and nasal saline.  I did encourage her not to take ibuprofen given her pregnancy.  Tylenol is safe.  Patient stated understanding. ?(Labs, imaging) ? ?Labs: ?I Ordered, and personally interpreted labs.  The pertinent results include: None ? ?Imaging Studies ordered: ?I ordered imaging studies including none ?I independently visualized and interpreted imaging. ?I agree with the radiologist interpretation ? ?Additional history obtained from chart review.  External records from outside source obtained and reviewed including prior visit ? ?Critical  Interventions: ?Tylenol ? ?Consultations: ?I requested consultation with the NA,  and discussed lab and imaging findings as well as pertinent plan - they recommend: N/A ? ?Cardiac Monitoring: ?The patient was maintained on a cardiac monitor.  I personally viewed and interpreted the cardiac monitored which showed an underlying rhythm of: Normal sinus rhythm ? ?Reevaluation: ?After the interventions noted above, I reevaluated the patient and found that they have :stayed the same ? ? ?Considered admission for: N/A ? ?Social Determinants of Health: ?Lives independently, currently pregnant ? ?Disposition: Discharge ? ?Co morbidities that complicate the patient evaluation ? ?Past Medical History:  ?Diagnosis Date  ? Abnormal Pap smear   ?  Anxiety   ? Bipolar 1 disorder (HCC)   ? Depression   ? IUD (intrauterine device) in place 04/12/2013  ? Had IUD inserted 03/29/13 could not feel strings went to ER 9/18 and KUB saw IUD still could not see string, can't see now but seen in [place]  ? Kidney infection   ? Morbid obesity (HCC)   ? Pregnancy   ? UTI (urinary tract infection)   ?  ? ?Medicines ?Meds ordered this encounter  ?Medications  ? acetaminophen (TYLENOL) tablet 1,000 mg  ? fluticasone (FLONASE) 50 MCG/ACT nasal spray  ?  Sig: Place 1 spray into both nostrils daily.  ?  Dispense:  18.2 mL  ?  Refill:  2  ? cetirizine (ZYRTEC ALLERGY) 10 MG tablet  ?  Sig: Take 1 tablet (10 mg total) by mouth daily.  ?  Dispense:  30 tablet  ?  Refill:  0  ?  ?I have reviewed the patients home medicines and have made adjustments as needed ? ?Problem List / ED Course: ?Problem List Items Addressed This Visit   ?None ?Visit Diagnoses   ? ? Seasonal allergies    -  Primary  ? Sinus headache      ? Relevant Medications  ? acetaminophen (TYLENOL) tablet 1,000 mg  ? Primary hypertension      ? ?  ?  ? ? ? ? ? ? ? ? ? ? ? ? ?Final Clinical Impression(s) / ED Diagnoses ?Final diagnoses:  ?Seasonal allergies  ?Sinus headache  ?Primary hypertension   ? ? ?Rx / DC Orders ?ED Discharge Orders   ? ?      Ordered  ?  fluticasone (FLONASE) 50 MCG/ACT nasal spray  Daily       ? 10/24/21 0044  ?  cetirizine (ZYRTEC ALLERGY) 10 MG tablet  Daily       ? 10/24/21 0044  ? ?  ?  ? ?  ? ? ?  ?Brei Pociask, Court[place]

## 2021-10-24 NOTE — ED Triage Notes (Signed)
Pt c/o headache x 2 days with sinus pressure.  ?

## 2021-10-24 NOTE — Discharge Instructions (Signed)
You were seen today for symptoms likely related to seasonal allergies and allergic rhinitis.  You may start Flonase and nasal saline.  It is safe to take Zyrtec while pregnant.  Your blood pressure was elevated but you have a history of the same.  You need to monitor this closely and follow with your OB/GYN for management of this. ?

## 2021-11-08 ENCOUNTER — Telehealth: Payer: Self-pay | Admitting: Adult Health

## 2021-11-08 DIAGNOSIS — Z6791 Unspecified blood type, Rh negative: Secondary | ICD-10-CM | POA: Insufficient documentation

## 2021-11-08 DIAGNOSIS — Z87448 Personal history of other diseases of urinary system: Secondary | ICD-10-CM | POA: Insufficient documentation

## 2021-11-08 DIAGNOSIS — Z3490 Encounter for supervision of normal pregnancy, unspecified, unspecified trimester: Secondary | ICD-10-CM | POA: Insufficient documentation

## 2021-11-08 DIAGNOSIS — O26899 Other specified pregnancy related conditions, unspecified trimester: Secondary | ICD-10-CM | POA: Insufficient documentation

## 2021-11-08 DIAGNOSIS — O0990 Supervision of high risk pregnancy, unspecified, unspecified trimester: Secondary | ICD-10-CM | POA: Insufficient documentation

## 2021-11-08 NOTE — Telephone Encounter (Signed)
Ob patient thinks she may have a yeast infection. She states itching has been going on for a couple days, but she now has yeast discharge. She wants to know if she needs to come get swabbed or if something can be called in.  ?

## 2021-11-12 ENCOUNTER — Other Ambulatory Visit: Payer: Self-pay | Admitting: Obstetrics & Gynecology

## 2021-11-12 DIAGNOSIS — Z3682 Encounter for antenatal screening for nuchal translucency: Secondary | ICD-10-CM

## 2021-11-13 ENCOUNTER — Ambulatory Visit: Payer: Medicaid Other | Admitting: *Deleted

## 2021-11-13 ENCOUNTER — Encounter: Payer: Medicaid Other | Admitting: Women's Health

## 2021-11-13 ENCOUNTER — Ambulatory Visit (INDEPENDENT_AMBULATORY_CARE_PROVIDER_SITE_OTHER): Payer: Medicaid Other

## 2021-11-13 DIAGNOSIS — O26899 Other specified pregnancy related conditions, unspecified trimester: Secondary | ICD-10-CM

## 2021-11-13 DIAGNOSIS — Z6791 Unspecified blood type, Rh negative: Secondary | ICD-10-CM

## 2021-11-13 DIAGNOSIS — Z87448 Personal history of other diseases of urinary system: Secondary | ICD-10-CM

## 2021-11-13 DIAGNOSIS — Z3480 Encounter for supervision of other normal pregnancy, unspecified trimester: Secondary | ICD-10-CM

## 2021-11-13 DIAGNOSIS — Z3682 Encounter for antenatal screening for nuchal translucency: Secondary | ICD-10-CM

## 2021-11-13 NOTE — Progress Notes (Signed)
[place] 12+1 wks,measurements c/w dates,CRL 55.85 mm,normal ovaries,FHR 148 bpm,NB present,unable to obtain NT because of patients body habitus ?

## 2021-11-14 ENCOUNTER — Ambulatory Visit (INDEPENDENT_AMBULATORY_CARE_PROVIDER_SITE_OTHER): Payer: Medicaid Other | Admitting: Medical

## 2021-11-14 ENCOUNTER — Ambulatory Visit: Payer: Medicaid Other | Admitting: *Deleted

## 2021-11-14 ENCOUNTER — Telehealth: Payer: Self-pay | Admitting: Medical

## 2021-11-14 ENCOUNTER — Encounter: Payer: Self-pay | Admitting: Medical

## 2021-11-14 VITALS — BP 127/79 | HR 86 | Wt 365.6 lb

## 2021-11-14 DIAGNOSIS — O2341 Unspecified infection of urinary tract in pregnancy, first trimester: Secondary | ICD-10-CM

## 2021-11-14 DIAGNOSIS — O26899 Other specified pregnancy related conditions, unspecified trimester: Secondary | ICD-10-CM

## 2021-11-14 DIAGNOSIS — F319 Bipolar disorder, unspecified: Secondary | ICD-10-CM | POA: Insufficient documentation

## 2021-11-14 DIAGNOSIS — Z6841 Body Mass Index (BMI) 40.0 and over, adult: Secondary | ICD-10-CM

## 2021-11-14 DIAGNOSIS — F411 Generalized anxiety disorder: Secondary | ICD-10-CM

## 2021-11-14 DIAGNOSIS — Z3480 Encounter for supervision of other normal pregnancy, unspecified trimester: Secondary | ICD-10-CM

## 2021-11-14 DIAGNOSIS — Z3A12 12 weeks gestation of pregnancy: Secondary | ICD-10-CM

## 2021-11-14 DIAGNOSIS — Z6791 Unspecified blood type, Rh negative: Secondary | ICD-10-CM

## 2021-11-14 DIAGNOSIS — R002 Palpitations: Secondary | ICD-10-CM

## 2021-11-14 DIAGNOSIS — Z98891 History of uterine scar from previous surgery: Secondary | ICD-10-CM | POA: Insufficient documentation

## 2021-11-14 DIAGNOSIS — O99210 Obesity complicating pregnancy, unspecified trimester: Secondary | ICD-10-CM

## 2021-11-14 LAB — POCT URINALYSIS DIPSTICK OB
Blood, UA: NEGATIVE
Glucose, UA: NEGATIVE
Ketones, UA: NEGATIVE
Leukocytes, UA: NEGATIVE
Nitrite, UA: NEGATIVE

## 2021-11-14 MED ORDER — ASPIRIN EC 81 MG PO TBEC: 81.0000 mg | DELAYED_RELEASE_TABLET | Freq: Every day | ORAL | 11 refills | Status: DC

## 2021-11-14 MED ORDER — BLOOD PRESSURE MONITOR MISC: 0 refills | Status: DC

## 2021-11-14 NOTE — Patient Instructions (Signed)
Safe Medications in Pregnancy  ? ?Acne:  ?Benzoyl Peroxide  ?Salicylic Acid  ? ?Backache/Headache:  ?Tylenol: 2 regular strength every 4 hours OR  ?             2 Extra strength every 6 hours  ? ?Colds/Coughs/Allergies:  ?Benadryl (alcohol free) 25 mg every 6 hours as needed  ?Breath right strips  ?Claritin  ?Cepacol throat lozenges  ?Chloraseptic throat spray  ?Cold-Eeze- up to three times per day  ?Cough drops, alcohol free  ?Flonase (by prescription only)  ?Guaifenesin  ?Mucinex  ?Robitussin DM (plain only, alcohol free)  ?Saline nasal spray/drops  ?Sudafed (pseudoephedrine) & Actifed * use only after [redacted] weeks gestation and if you do not have high blood pressure  ?Tylenol  ?Vicks Vaporub  ?Zinc lozenges  ?Zyrtec  ? ?Constipation:  ?Colace  ?Ducolax suppositories  ?Fleet enema  ?Glycerin suppositories  ?Metamucil  ?Milk of magnesia  ?Miralax  ?Senokot  ?Smooth move tea  ? ?Diarrhea:  ?Kaopectate  ?Imodium A-D  ? ?*NO pepto Bismol  ? ?Hemorrhoids:  ?Anusol  ?Anusol HC  ?Preparation H  ?Tucks  ? ?Indigestion:  ?Tums  ?Maalox  ?Mylanta  ?Zantac  ?Pepcid  ? ?Insomnia:  ?Benadryl (alcohol free) 25mg every 6 hours as needed  ?Tylenol PM  ?Unisom, no Gelcaps  ? ?Leg Cramps:  ?Tums  ?MagGel  ? ?Nausea/Vomiting:  ?Bonine  ?Dramamine  ?Emetrol  ?Ginger extract  ?Sea bands  ?Meclizine  ?Nausea medication to take during pregnancy:  ?Unisom (doxylamine succinate 25 mg tablets) Take one tablet daily at bedtime. If symptoms are not adequately controlled, the dose can be increased to a maximum recommended dose of two tablets daily (1/2 tablet in the morning, 1/2 tablet mid-afternoon and one at bedtime).  ?Vitamin B6 100mg tablets. Take one tablet twice a day (up to 200 mg per day).  ? ?Skin Rashes:  ?Aveeno products  ?Benadryl cream or 25mg every 6 hours as needed  ?Calamine Lotion  ?1% cortisone cream  ? ?Yeast infection:  ?Gyne-lotrimin 7  ?Monistat 7  ? ? ?**If taking multiple medications, please check labels to avoid  duplicating the same active ingredients  ?**take medication as directed on the label  ?** Do not exceed 4000 mg of tylenol in 24 hours  ?**Do not take medications that contain aspirin or ibuprofen  ?    ?   ? ?AREA PEDIATRIC/FAMILY PRACTICE PHYSICIANS ? ?[place] ([address]) [facility] ?Chambliss, MD; Eniola, MD; Hale, MD; Hensel, MD; McDiarmid, MD; McIntyer, MD; Neal, MD; Walden, MD ?[address] [place], [place] [address] ?[phone] ?Mon-Fri 8:30-12:30, 1:30-5:00 ?Providers come to see babies at [facility] ?Accepting Medicaid ?[facility] ?Limited providers who accept newborns: Koirala, MD; Morrow, MD; Wolters, MD ?[address], [place], [place] [address] ?[phone] ?Mon-Fri 8:00-5:30 ?Babies seen by providers at [facility] ?Does NOT accept Medicaid ?Please call early in hospitalization for appointment (limited availability)  ?[facility] ?Mulberry, MD ?[address] [place], [place] [address] ?[phone] ?Mon, Tue, Thur, Fri 8:30-5:00, Wed 10:00-7:00 (closed 1-2pm) ?Babies seen by [facility] providers ?Accepting Medicaid ?[facility] ?Rubin, MD ?[address], [place], [place] [address] ?[phone] ?Mon-Fri 8:30-5:00, Sat 8:30-12:00 ?Provider comes to see babies at [facility] ?Accepting Medicaid ?Must have been referred from current patients or contacted office prior to delivery ?[facility] ([facility]) ?Brown, MD; Chandler, MD; Ettefagh,   MD; Grant, MD; Lester, MD; McCormick, MD; McQueen, MD; Prose, MD; Simha, MD; Stanley, MD; Stryffeler, NP; Tebben, NP ?[address], [place], [place] [address] ?[phone] ?Mon, Tue, Thur, Fri 8:30-5:30, Wed 9:30-5:30, Sat 8:30-12:30 ?Babies seen by [facility] providers ?Accepting Medicaid ?Only accepting infants of first-time parents or  siblings of current patients ?Hospital discharge coordinator will make follow-up appointment ?Jack Amos ?[address], [place], [place]  [address] ?[phone]   Fax - [phone] ?[facility] ?[address], [place], [place]  [address] ?Phone - [phone]   Fax - [phone] ?Shilpa Gosrani ?[address], [place], [place]  [address] ?[phone] ? ?[place] ([address]) ?[facility] ?Bates, MD; [place], MD; Cooper, Cox, MD; MD; Davis, MD; Dovico, MD; Ettefaugh, MD; Little, MD; Lowe, MD; Keiffer, MD; Melvin, MD; Sumner, MD; Williams, MD ?[address], [place], [place] [address] ?[phone] ?Mon-Fri 8:30-5:00 (extended evenings Mon-Thur as needed), Sat-Sun 10:00-1:00 ?Providers come to see babies at [facility] ?Accepting Medicaid for families of first-time babies and families with all children in the household age 3 and under. Must register with office prior to making appointment (M-F only). ?[facility] ?Henson, NP; Knapp, MD; Lalonde, MD; Tysinger, PA ?[address] [place], [place] [address] ?[phone] ?Mon-Fri 8:00-5:00 ?Babies seen by providers at [facility] ?Does NOT accept Medicaid/Commercial Insurance Only ?[facility] ([facility])  ?Artis, MD; Barnes, MD; Bratton, MD; Coccaro, MD; Lockett Gardner, MD; Kramer, MD; Marshall, MD; Netherton, MD; Poleto, MD; Skinner, MD ?[address] [place], [place] [address] ?[phone] ?Mon-Fri 8:30-5:30, Sat (Oct.-Mar.) 9:00-1:00 ?Babies seen by providers at [facility] ?Accepting Medicaid ? ?[place] ([address]) ?[facility] ?Reid, MD; Warner, MD ?[address], [place], [place] [address] ?[phone] ?Mon-Fri 8:30-5:00, Sat 8:30-12:00 ?Providers come to see babies at [facility] ?Does NOT accept Medicaid ?[facility] ?Becker, PA; Hagler, MD; Scifres, PA; Sun,  MD; Swayne, MD ?[address], [place], [place] [address] ?[phone] ?Mon-Fri 8:00-5:00 ?Babies seen by providers at [facility] ?Does NOT accept Medicaid ?Only accepting babies of parents who are patients ?Please call early in hospitalization for appointment (limited availability) ?[facility] ?Clark, MD; Frye, MD; Kelleher, MD; Mack, NP; Miller, MD; O'Keller, MD; Patterson, NP; Pudlo, MD; Puzio, MD; Thomas, MD; Tucker, MD; Twiselton, MD ?[address], [place], [place] [address] ?[phone] ?Mon-Fri 8:00-5:00, Sat 9:00-12:00 ?Providers come to see babies at [facility] ?Does NOT accept Medicaid ? ?[place] ([address]) ?[facility] ?Limited providers accepting new patients: Brake, NP; Wharton, PA ?[address], [place], [place] [address] ?[phone] ?Mon-Fri 8:00-5:00 ?Babies seen by providers at [facility] ?Does NOT accept Medicaid ?Only accepting babies of parents who are patients ?Please call early in hospitalization for appointment (limited availability) ?[facility] ?Gay, MD; Quinlan, MD ?[address] [place], [place] [address] ?[phone] (press [phone] to schedule appointment) ?Mon-Fri 8:00-5:00 ?Providers come to see babies at [facility] ?Does NOT accept Medicaid ?[facility] ?Mazer, MD ?[address] [place], [place] [address] ?[phone] ?Mon-Fri 8:30-5:00 (lunch 12:30-1:00), extended hours by appointment only Wed 5:00-6:30 ?Babies seen by [facility] providers ?Accepting Medicaid ?[facility] ?Banks, MD; Jordan, MD; Koberlein, MD ?[address], ,  [address] ?[phone] ?Mon-Fri 8:00-5:00 ?Babies seen by [facility] providers ?Does NOT accept Medicaid ?[facility] ?Parker, MD; Hunter, MD; [place], DO ?[address]   , [place] [address] ?[phone] ?Mon-Fri 8:00-5:00 ?Babies seen by [facility] providers ?Does NOT accept Medicaid ?[facility] ?Brandon, PA; Brecken, PA; Christy, NP; Dees, MD; DeClaire, MD; DeWeese, MD; Hansen, NP; Mills, NP; Parrish, NP; Smoot, NP; Summer, MD; Vapne, M

## 2021-11-14 NOTE — Telephone Encounter (Signed)
Patient wanted to ask about the pain in hips and she wanted to ask about mowing grass. Her fiance says she can't and to ask you all. Please advise.  ?

## 2021-11-14 NOTE — Progress Notes (Signed)
? ?  PRENATAL VISIT NOTE ? ?Subjective:  ?[name] [name] is a 36 y.o. [phone] at [redacted]w[redacted]d being seen today for her first prenatal visit for this pregnancy.  She is currently monitored for the following issues for this high-risk pregnancy and has History of pyelonephritis; Encounter for supervision of normal pregnancy, antepartum; Rh negative state in antepartum period; Bipolar disorder ([place]); Generalized anxiety disorder; and Obesity in pregnancy, antepartum on their problem list. ? ?Patient reports no complaints.  Contractions: Not present. Vag. Bleeding: None.   . Denies leaking of fluid.  ? ?She is planning to breastfeed. Desires contraception, unsure of method ? ?The following portions of the patient's history were reviewed and updated as appropriate: allergies, current medications, past family history, past medical history, past social history, past surgical history and problem list.  ? ?Objective:  ? ?Vitals:  ? 11/14/21 1044  ?BP: 127/79  ?Pulse: 86  ?Weight: (!) 365 lb 9.6 oz (165.8 kg)  ? ? ?Fetal Status:          ? ?General:  Alert, oriented and cooperative. Patient is in no acute distress.  ?Skin: Skin is warm and dry. No rash noted.   ?Cardiovascular: Normal heart rate noted  ?Respiratory: Normal respiratory effort, no problems with respiration.   ?Abdomen: Soft, gravid, appropriate for gestational age. Non-tender. Pain/Pressure: Absent     ?Pelvic: Cervical exam deferred        ?Extremities: Normal range of motion.  Edema: None  ?Mental Status: Normal mood and affect. Normal behavior. Normal judgment and thought content.  ? ?Assessment and Plan:  ?Pregnancy: [id number] at [redacted]w[redacted]d ?1. Supervision of other normal pregnancy, antepartum ?- Urine Culture ?- Genetic Screening ?- [facility] AMB BABYSCRIPTS SCHEDULE OPTIMIZATION ?- Blood Pressure Monitor MISC; For regular home bp monitoring during pregnancy ?Needs large cuff  Dispense: 1 each; Refill: 0 ?- CBC/D/Plt+RPR+Rh+ABO+RubIgG... ?- GC/Chlamydia Probe Amp ?- POC  Urinalysis Dipstick OB ?- Hemoglobin A1c ?- Normal pap in Epic from 12/26/20 ? ?2. Body mass index (BMI) 60.0-69.9, adult (HCC) ?- Hemoglobin A1c ?- aspirin EC 81 MG tablet; Take 1 tablet (81 mg total) by mouth daily. Swallow whole.  Dispense: 30 tablet; Refill: 11 ? ?3. Obesity in pregnancy, antepartum ?- aspirin EC 81 MG tablet; Take 1 tablet (81 mg total) by mouth daily. Swallow whole.  Dispense: 30 tablet; Refill: 11 ? ?4. Rh negative state in antepartum period ?- Had Rhogam 09/25/21 after fall, plan for repeat dose third trimester ? ?5. Generalized anxiety disorder ?- Currently discontinued all medications due to pregnancy ?- Discussed that some medications are still considered safe and we can evaluate her symptoms as the pregnancy progresses ? ?6. [redacted] weeks gestation of pregnancy ? ?7. Heart palpitations ?- AMB Referral to [facility] ? ?8. Previous C/S ?- Desires TOLAC, discussed today, will sign consent around 28 weeks  ? ?Preterm labor/first trimester warning symptoms and general obstetric precautions including but not limited to vaginal bleeding, contractions, leaking of fluid and fetal movement were reviewed in detail with the patient. ?Please refer to After Visit Summary for other counseling recommendations.  ? ?Discussed the normal visit cadence for prenatal care ?Discussed the nature of our practice with multiple providers including residents and students  ? ?Return in about 4 weeks (around 12/12/2021) for LOB, In-Person, any provider. ? ?Future Appointments  ?Date Time Provider [place]  ?12/12/2021 10:10 AM Myrtis Ser, CNM [facility]  ? ? ?Kerry Hough, PA-C ? ?

## 2021-11-15 LAB — CBC/D/PLT+RPR+RH+ABO+RUBIGG...
Antibody Screen: NEGATIVE
Basophils Absolute: 0.1 10*3/uL (ref 0.0–0.2)
Basos: 1 %
EOS (ABSOLUTE): 0.1 10*3/uL (ref 0.0–0.4)
Eos: 1 %
HCV Ab: NONREACTIVE
HIV Screen 4th Generation wRfx: NONREACTIVE
Hematocrit: 41.1 % (ref 34.0–46.6)
Hemoglobin: 13.1 g/dL (ref 11.1–15.9)
Hepatitis B Surface Ag: NEGATIVE
Immature Grans (Abs): 0 10*3/uL (ref 0.0–0.1)
Immature Granulocytes: 0 %
Lymphocytes Absolute: 1.6 10*3/uL (ref 0.7–3.1)
Lymphs: 16 %
MCH: 25.6 pg — ABNORMAL LOW (ref 26.6–33.0)
MCHC: 31.9 g/dL (ref 31.5–35.7)
MCV: 80 fL (ref 79–97)
Monocytes Absolute: 0.7 10*3/uL (ref 0.1–0.9)
Monocytes: 6 %
Neutrophils Absolute: 7.8 10*3/uL — ABNORMAL HIGH (ref 1.4–7.0)
Neutrophils: 76 %
Platelets: 288 10*3/uL (ref 150–450)
RBC: 5.11 x10E6/uL (ref 3.77–5.28)
RDW: 15.2 % (ref 11.7–15.4)
RPR Ser Ql: NONREACTIVE
Rh Factor: NEGATIVE
Rubella Antibodies, IGG: 20.8 index (ref >0.99)
WBC: 10.2 10*3/uL (ref 3.4–10.8)

## 2021-11-15 LAB — HEMOGLOBIN A1C
Est. average glucose Bld gHb Est-mCnc: 108 mg/dL
Hgb A1c MFr Bld: 5.4 % (ref 4.8–5.6)

## 2021-11-15 LAB — HCV INTERPRETATION

## 2021-11-16 LAB — GC/CHLAMYDIA PROBE AMP
Chlamydia trachomatis, NAA: NEGATIVE
Neisseria Gonorrhoeae by PCR: NEGATIVE

## 2021-11-17 LAB — URINE CULTURE

## 2021-11-19 MED ORDER — CEPHALEXIN 500 MG PO CAPS: 500.0000 mg | ORAL_CAPSULE | Freq: Four times a day (QID) | ORAL | 0 refills | Status: DC

## 2021-11-19 NOTE — Addendum Note (Signed)
Addended by: Luvenia Redden on: 11/19/2021 09:51 AM ? ? Modules accepted: Orders ? ?

## 2021-11-21 ENCOUNTER — Ambulatory Visit (INDEPENDENT_AMBULATORY_CARE_PROVIDER_SITE_OTHER): Payer: Medicaid Other | Admitting: *Deleted

## 2021-11-21 VITALS — BP 126/85 | HR 106

## 2021-11-21 DIAGNOSIS — O99210 Obesity complicating pregnancy, unspecified trimester: Secondary | ICD-10-CM

## 2021-11-21 DIAGNOSIS — Z3480 Encounter for supervision of other normal pregnancy, unspecified trimester: Secondary | ICD-10-CM

## 2021-11-21 DIAGNOSIS — O26899 Other specified pregnancy related conditions, unspecified trimester: Secondary | ICD-10-CM

## 2021-11-21 NOTE — Progress Notes (Signed)
? ?  NURSE VISIT- Fetal Heart Rate Check ? ?SUBJECTIVE:  ?ANNELYSE [name] is a 36 y.o. [phone] female at [redacted]w[redacted]d, here for a fetal heart rate check for light bleeding noted when she went to the bathroom.  Last intercourse 4 days ago. Also has not started her medication for UTI ? ?OBJECTIVE:  ?BP 126/85   Pulse (!) 106   LMP 08/20/2021   ?Appears well, no apparent distress ? ?FHR 155 via doppler  ? ?ASSESSMENT: ?[id number] at [redacted]w[redacted]d with present fetal heart rate ? ?PLAN: ?Follow-up as scheduled ?Advised to pick up antibiotic and start today.  ? ?Jobe Marker  ?11/21/2021 ?2:34 PM ? ?

## 2021-11-26 ENCOUNTER — Ambulatory Visit (INDEPENDENT_AMBULATORY_CARE_PROVIDER_SITE_OTHER): Payer: Medicaid Other | Admitting: Adult Health

## 2021-11-26 ENCOUNTER — Telehealth: Payer: Self-pay

## 2021-11-26 ENCOUNTER — Encounter: Payer: Self-pay | Admitting: Adult Health

## 2021-11-26 VITALS — BP 143/86 | HR 84 | Ht 65.0 in | Wt 367.8 lb

## 2021-11-26 DIAGNOSIS — N760 Acute vaginitis: Secondary | ICD-10-CM | POA: Insufficient documentation

## 2021-11-26 DIAGNOSIS — N898 Other specified noninflammatory disorders of vagina: Secondary | ICD-10-CM | POA: Diagnosis not present

## 2021-11-26 DIAGNOSIS — B9689 Other specified bacterial agents as the cause of diseases classified elsewhere: Secondary | ICD-10-CM | POA: Insufficient documentation

## 2021-11-26 LAB — POCT WET PREP (WET MOUNT)
Clue Cells Wet Prep Whiff POC: NEGATIVE
WBC, Wet Prep HPF POC: POSITIVE

## 2021-11-26 MED ORDER — METRONIDAZOLE 500 MG PO TABS: 500.0000 mg | ORAL_TABLET | Freq: Two times a day (BID) | ORAL | 0 refills | Status: DC

## 2021-11-26 NOTE — Progress Notes (Signed)
?  Subjective:  ?  ? Patient ID: [name] [name], female   DOB: [birth year], 36 y.o.   MRN: [id number] ? ?HPI ?[name] [name] is a 36 year old female, single, [id number], Worked in for vaginal itching, for 2 1/2 weeks,has used monistat 7 x 2, feels raw now, did scratch. ?She is also about [redacted] weeks pregnant and had negative GC/CGL 11/14/21, and was treated for recent UTI. ?Lab Results  ?Component Value Date  ? DIAGPAP  12/26/2020  ?  - Negative for intraepithelial lesion or malignancy (NILM)  ? HPV NOT DETECTED 05/04/2018  ? HPVHIGH Negative 12/26/2020  ?  ?PCP is [facility] ? ?Review of Systems ?Vaginal itching for 2 1/2 weeks  ?   Feels raw ?Reviewed past medical,surgical, social and family history. Reviewed medications and allergies.  ?Objective:  ? Physical Exam ?BP (!) 143/86 (BP Location: Right Arm, Patient Position: Sitting, Cuff Size: Normal)   Pulse 84   Ht 5\' 5"  (1.651 m)   Wt (!) 367 lb 12.8 oz (166.8 kg)   LMP 08/20/2021   BMI 61.21 kg/m?   ?  Skin warm and dry.Pelvic: external genitalia is normal in appearance no lesions,but red, vagina: white discharge without odor,urethra has no lesions or masses noted, cervix:smooth and bulbous, uterus: normal size, shape and contour, non tender, no masses felt, adnexa: no masses or tenderness noted. Bladder is non tender and no masses felt. Wet prep: + for clue cells and +WBCs. ? [facility] - 11/26/21 1206   ? ?  ? Pregnancy Intention Screening  ? Does the patient want to become pregnant in the next year? N/A   ? Does the patient's partner want to become pregnant in the next year? N/A   ? Would the patient like to discuss contraceptive options today? N/A   ?  ? Contraception Wrap Up  ? Current Method Pregnant/Seeking Pregnancy   ? End Method Pregnant/Seeking Pregnancy   ? Contraception Counseling Provided No   ? ?  ?  ? ?  ? Examination chaperoned by 01/26/22 LPN ? ?Assessment:  ?   ?1. Vaginal itching ? ?2. Vaginal discharge ? ?3. BV (bacterial vaginosis) ?Will rx  flagyl,no alcohol or sex during treatment  ? ?Meds ordered this encounter  ?Medications  ? metroNIDAZOLE (FLAGYL) 500 MG tablet  ?  Sig: Take 1 tablet (500 mg total) by mouth 2 (two) times daily.  ?  Dispense:  14 tablet  ?  Refill:  0  ?  Order Specific Question:   Supervising Provider  ?  Answer:   Faith Rogue H [[id number]]  ? Can use Aquaphor 3 in 1 for external irritation ?   ?Plan:  ?   ?Has follow up appt 12/12/21 with 12/14/21 ? ?   ?

## 2021-11-27 ENCOUNTER — Encounter: Payer: Self-pay | Admitting: *Deleted

## 2021-11-28 NOTE — Telephone Encounter (Signed)
Closed

## 2021-11-29 ENCOUNTER — Other Ambulatory Visit (INDEPENDENT_AMBULATORY_CARE_PROVIDER_SITE_OTHER): Payer: Medicaid Other

## 2021-11-29 DIAGNOSIS — Z6791 Unspecified blood type, Rh negative: Secondary | ICD-10-CM

## 2021-11-29 DIAGNOSIS — Z3480 Encounter for supervision of other normal pregnancy, unspecified trimester: Secondary | ICD-10-CM

## 2021-11-29 DIAGNOSIS — O99210 Obesity complicating pregnancy, unspecified trimester: Secondary | ICD-10-CM

## 2021-11-29 NOTE — Progress Notes (Signed)
? ?  NURSE VISIT- [facility] ? ?SUBJECTIVE:  ?[name] [name] is a 36 y.o. [phone] female here for [facility] NIPT redraw. She is [redacted]w[redacted]d pregnant.  ? ?OBJECTIVE:  ?Appears well, in no apparent distress ? ?Blood work drawn from right [facility] without difficulty. 1 attempt(s).  ? ?ASSESSMENT: ?Pregnancy [redacted]w[redacted]d [facility] NIPT redraw ? ?PLAN: ?[facility] portal information given and instructed patient how to access results ?Follow-up as scheduled ? ?Debbe Odea Gwynn Chalker  ?11/29/2021 ?4:00 PM  ? ?

## 2021-11-30 ENCOUNTER — Emergency Department (HOSPITAL_COMMUNITY)
Admission: EM | Admit: 2021-11-30 | Discharge: 2021-11-30 | Discharge status: Against Medical Advice | Payer: Medicaid Other | Attending: Emergency Medicine | Admitting: Emergency Medicine

## 2021-11-30 ENCOUNTER — Encounter (HOSPITAL_COMMUNITY): Payer: Self-pay | Admitting: Emergency Medicine

## 2021-11-30 ENCOUNTER — Other Ambulatory Visit: Payer: Self-pay

## 2021-11-30 DIAGNOSIS — Z3A14 14 weeks gestation of pregnancy: Secondary | ICD-10-CM | POA: Diagnosis not present

## 2021-11-30 DIAGNOSIS — Z5321 Procedure and treatment not carried out due to patient leaving prior to being seen by health care provider: Secondary | ICD-10-CM | POA: Insufficient documentation

## 2021-11-30 DIAGNOSIS — O1492 Unspecified pre-eclampsia, second trimester: Secondary | ICD-10-CM | POA: Diagnosis present

## 2021-11-30 NOTE — ED Triage Notes (Signed)
Pt is about 13-[redacted] weeks pregnant. Pt EDD is 11/6. Pt concerned for preeclampsia. States she receives OB care from [facility]. Pt reports BP was 152/102 at home. BP in triage is 138/91. No other complaints. ?

## 2021-12-03 NOTE — Progress Notes (Signed)
Cardio-Obstetrics Clinic  New Evaluation  Date:  12/14/2021   ID:  [name] [name], DOB 08/09/1985, MRN [id number]  PCP:  [facility]   [facility] Providers Cardiologist:  Prentice Docker, MD (Inactive)  Electrophysiologist:  None       Referring MD: [facility],*   Chief Complaint: palpitations  History of Present Illness:    [name] [name] is a 36 y.o. female [G5P1122] who is being seen today for the evaluation of palpitations at the request of [facility],*.   Patient has a history of bipolar disorder and morbid obesity. She is currently [redacted] weeks pregnant.   Saw Dr. Purvis Sheffield in 2020 for atypical chest pain. TTE showed LVEF 50-55%, normal RV, normal PASP, no significant valve disease. Symptoms were thought to be related to anxiety.  Today, the patient states that she has been having significant palpitations and dyspnea on exertion. Symptoms have been ongoing since the start of her pregnancy. States that when the palpitations are occurring, she can have associated lightheadedness. Symptoms last can for 1-98min before resolving. Nothing seems to make it better or worse. Has occasional ankle edema, but no significant swelling. Has been on ASA 81mg  daily.   Blood pressures have been running very high at home on home forearm cuff up to 200s. No known history of HTN or prior pregnancy complications.    Dad: CHF, HTN, DMII, CAD s/p PCI, Obesity   Prior CV Studies Reviewed: The following studies were reviewed today: TTE 2018/12/27: IMPRESSIONS     1. The left ventricle has low normal systolic function, with an ejection  fraction of 50-55%. Image quality not adequate to assess focal wall  motion. The cavity size was normal. Left ventricular diastolic parameters  were normal.   2. The right ventricle has normal systolic function. The cavity was  normal. There is no increase in right ventricular wall thickness. Right  ventricular  systolic pressure normal with an estimated pressure of 23.2  mmHg.   3. The mitral valve is grossly normal.   4. The tricuspid valve is grossly normal.   5. The aortic valve is tricuspid.   6. The aortic root is normal in size and structure.   Past Medical History:  Diagnosis Date   Abnormal Pap smear    Anxiety    Bipolar 1 disorder (HCC)    Depression    GERD (gastroesophageal reflux disease)    IUD (intrauterine device) in place 04/12/2013   Had IUD inserted 03/29/13 could not feel strings went to ER 9/18 and KUB saw IUD still could not see string, can't see now but seen in 10/18   Kidney infection    Morbid obesity (HCC)    Pregnancy    UTI (urinary tract infection)     Past Surgical History:  Procedure Laterality Date   CESAREAN SECTION     HYSTEROSCOPY N/A 01/09/2021   Procedure: HYSTEROSCOPY;  Surgeon: 01/11/2021, DO;  Location: [facility];  Service: Gynecology;  Laterality: N/A;   IUD REMOVAL N/A 01/09/2021   Procedure: INTRAUTERINE DEVICE (IUD) REMOVAL;  Surgeon: 01/11/2021, DO;  Location: [facility];  Service: Gynecology;  Laterality: N/A;   TOOTH EXTRACTION        OB History     Gravida  5   Para  2   Term  1   Preterm  1   AB  2   Living  2      SAB  2   IAB  0   Ectopic  0   Multiple  0   Live Births  2               Current Medications: Current Meds  Medication Sig   aspirin EC 81 MG tablet Take 1 tablet (81 mg total) by mouth daily. Swallow whole.   Blood Pressure Monitor MISC For regular home bp monitoring during pregnancy Needs large cuff   cetirizine (ZYRTEC ALLERGY) 10 MG tablet Take 1 tablet (10 mg total) by mouth daily.   cetirizine (ZYRTEC) 10 MG tablet Take 10 mg by mouth daily.   Doxylamine-Pyridoxine (DICLEGIS) 10-10 MG TBEC 2 tabs q hs, if sx persist add 1 tab q am on day 3, if sx persist add 1 tab q afternoon on day 4   fluticasone (FLONASE) 50 MCG/ACT nasal spray Place 1 spray into both nostrils daily.   FOLIC ACID PO Take  by mouth.   MELATONIN PO Take by mouth.   montelukast (SINGULAIR) 10 MG tablet Take 10 mg by mouth daily.   montelukast (SINGULAIR) 10 MG tablet Take 1 tablet by mouth at bedtime.   NIFEdipine (PROCARDIA-XL/NIFEDICAL-XL) 30 MG 24 hr tablet Take 1 tablet (30 mg total) by mouth daily.   Prenatal Vit-Fe Fumarate-FA (PRENATAL VITAMIN PO) Take by mouth.     Allergies:   Shellfish allergy   Social History   Socioeconomic History   Marital status: Single    Spouse name: Not on file   Number of children: 2   Years of education: Not on file   Highest education level: Not on file  Occupational History   Not on file  Tobacco Use   Smoking status: Former    Types: Cigarettes    Quit date: 05/21/2007    Years since quitting: 14.5   Smokeless tobacco: Never  Vaping Use   Vaping Use: Never used  Substance and Sexual Activity   Alcohol use: No   Drug use: Not Currently    Types: Marijuana    Comment: 3 times a day   Sexual activity: Yes    Partners: Male    Birth control/protection: None  Other Topics Concern   Not on file  Social History Narrative   Not on file   Social Determinants of Health   Financial Resource Strain: Low Risk    Difficulty of Paying Living Expenses: Not very hard  Food Insecurity: No Food Insecurity   Worried About Programme researcher, broadcasting/film/videounning Out of Food in the Last Year: Never true   Ran Out of Food in the Last Year: Never true  Transportation Needs: No Transportation Needs   Lack of Transportation (Medical): No   Lack of Transportation (Non-Medical): No  Physical Activity: Inactive   Days of Exercise per Week: 0 days   Minutes of Exercise per Session: 0 min  Stress: Stress Concern Present   Feeling of Stress : Rather much  Social Connections: Socially Isolated   Frequency of Communication with Friends and Family: Never   Frequency of Social Gatherings with Friends and Family: Never   Attends Religious Services: Never   Database administratorActive Member of Clubs or Organizations: No    Attends Engineer, structuralClub or Organization Meetings: Never   Marital Status: Divorced      Family History  Problem Relation Age of Onset   Hypertension Father    Diabetes Father    Congestive Heart Failure Father    Diabetes Paternal Aunt    Diabetes Paternal Uncle    Hypertension Paternal Grandmother  Diabetes Paternal Grandmother    Diabetes Paternal Grandfather       ROS:   Please see the history of present illness.     All other systems reviewed and are negative.   Labs/EKG Reviewed:    EKG:   EKG is  ordered today.  The ekg ordered today demonstrates sinus tachycardia with HR 104  Recent Labs: 09/25/2021: ALT 32; BUN 12; Creatinine, Ser 0.88; Potassium 4.1; Sodium 135 11/14/2021: Hemoglobin 13.1; Platelets 288   Recent Lipid Panel No results found for: CHOL, TRIG, HDL, CHOLHDL, LDLCALC, LDLDIRECT  Physical Exam:    VS:  BP (!) 148/88   Pulse (!) 104   Ht  (1.651 m)   Wt (!) 370 lb 6.4 oz (168 kg)   LMP 08/20/2021   SpO2 98%   BMI 61.64 kg/m     Wt Readings from Last 3 Encounters:  12/14/21 (!) 370 lb 6.4 oz (168 kg)  12/12/21 (!) 365 lb 6.4 oz (165.7 kg)  11/30/21 (!) 364 lb (165.1 kg)     GEN:  Well nourished, well developed in no acute distress HEENT: Normal NECK: No JVD; No carotid bruits CARDIAC: RRR, no murmurs, rubs, gallops RESPIRATORY:  Clear to auscultation without rales, wheezing or rhonchi  ABDOMEN: Obese, soft MUSCULOSKELETAL:  No edema; No deformity  SKIN: Warm and dry NEUROLOGIC:  Alert and oriented x 3 PSYCHIATRIC:  Normal affect    ASSESSMENT & PLAN:    #Palpitations: Patient presents with worsening palpitations that have been ongoing since the start of her pregnancy. Has associated lightheadedness but no syncope. Also with significant dyspnea on exertion which has limited her activity. Discussed that palpitations are common in pregnancy but will assess further with zio monitor and TTE.  -Check zio monitor -Check TTE -Increase  hydration -Small, frequent meals  #Dyspnea on Exertion: Has been ongoing since the start of her pregnancy. Again, this may be related to the underlying hormonal changes in pregnancy, however, given family history and degree of symptoms, will check TTE for further evaluation. -Check TTE  #Gestational HTN: BP persistently in 140s on multiple MD visits and running as high as 200s at home. Will start nifefipine and have her follow-up with PharmD clinic. Will ensure she brings cuff to that visit to verify readings are accurate. Patient is already on ASA. -Start nifedipine  daily -Follow-up with Pharm D HTN clinic -Will bring cuff to visit to correlate readings  #High Risk Pregnancy: #Morbid Obesity: Has discussed with OBGYN. Currently on ASA  daily. Will discuss CV risk reduction going forward.   Patient Instructions  Medication Instructions:   START TAKING NIFEDIPINE 30 MG BY MOUTH DAILY  *If you need a refill on your cardiac medications before your next appointment, please call your pharmacy*   You have been referred to SEE [facility] PHARMACIST AT OUR [facility] OFFICE IN 1-2 WEEKS PER DR. Alvaro Aungst--FOR MED MANAGEMENT AND HYPERTENSION FOLLOW-UP (MUST BE [facility] ONLY-CARDIO OBSTETRICS PATIENT)   Testing/Procedures:  Your physician has requested that you have an echocardiogram. Echocardiography is a painless test that uses sound waves to create images of your heart. It provides your doctor with information about the size and shape of your heart and how well your heart's chambers and valves are working. This procedure takes approximately one hour. There are no restrictions for this procedure.  Cardiac-OB patient: to be performed by [place] or [place].    ZIO XT- Long Term Monitor Instructions  Your physician has requested you wear a ZIO  patch monitor for 7  days.  This is a single patch monitor. [employer] supplies one patch monitor per enrollment. Additional stickers are not available.  Please do not apply patch if you will be having a Nuclear Stress Test,  Echocardiogram, Cardiac CT, MRI, or Chest Xray during the period you would be wearing the  monitor. The patch cannot be worn during these tests. You cannot remove and re-apply the  ZIO XT patch monitor.  Your ZIO patch monitor will be mailed 3 day USPS to your address on file. It may take 3-5 days  to receive your monitor after you have been enrolled.  Once you have received your monitor, please review the enclosed instructions. Your monitor  has already been registered assigning a specific monitor serial # to you.  Billing and Patient Assistance Program Information  We have supplied [employer] with any of your insurance information on file for billing purposes. [employer] offers a sliding scale Patient Assistance Program for patients that do not have  insurance, or whose insurance does not completely cover the cost of the ZIO monitor.  You must apply for the Patient Assistance Program to qualify for this discounted rate.  To apply, please call [employer] at [phone], select option 4, select option 2, ask to apply for  Patient Assistance Program. Meredeth Ide will ask your household income, and how many people  are in your household. They will quote your out-of-pocket cost based on that information.  [employer] will also be able to set up a 15-month, interest-free payment plan if needed.  Applying the monitor   Shave hair from upper left chest.  Hold abrader disc by orange tab. Rub abrader in 40 strokes over the upper left chest as  indicated in your monitor instructions.  Clean area with 4 enclosed alcohol pads. Let dry.  Apply patch as indicated in monitor instructions. Patch will be placed under collarbone on left  side of chest with arrow pointing upward.  Rub patch adhesive wings for 2 minutes. Remove white label marked "1". Remove the white  label marked "2". Rub patch adhesive wings for 2 additional minutes.  While  looking in a mirror, press and release button in center of patch. A small green light will  flash 3-4 times. This will be your only indicator that the monitor has been turned on.  Do not shower for the first 24 hours. You may shower after the first 24 hours.  Press the button if you feel a symptom. You will hear a small click. Record Date, Time and  Symptom in the Patient Logbook.  When you are ready to remove the patch, follow instructions on the last 2 pages of Patient  Logbook. Stick patch monitor onto the last page of Patient Logbook.  Place Patient Logbook in the blue and white box. Use locking tab on box and tape box closed  securely. The blue and white box has prepaid postage on it. Please place it in the mailbox as  soon as possible. Your physician should have your test results approximately 7 days after the  monitor has been mailed back to [facility].  Call [facility] Customer Care at [phone] if you have questions regarding  your ZIO XT patch monitor. Call them immediately if you see an orange light blinking on your  monitor.  If your monitor falls off in less than 4 days, contact our Monitor department at [phone].  If your monitor becomes loose or falls off after 4 days call [employer] at [phone]  for  suggestions on securing your monitor   Follow-Up:  1.)  1-2 WEEKS WITH [facility] PHARMACIST AT OUR [facility] OFFICE FOR HTN AND MED MANAGEMENT (SEE [facility] ONLY-CARDIO OB PATIENT)--PLEASE BRING YOUR BLOOD PRESSURE CUFF TO THIS VISIT.   2.)  1 MONTH WITH DR. Shari Prows HERE AT [facility]     Other Instructions  PLEASE CONTINUE MONITORING YOUR BLOOD PRESSURES AT HOME--PLEASE BRING YOUR BP CUFF TO YOUR APPOINTMENT WITH [facility] PHARMACIST IN 1-2 WEEKS    Important Information About Sugar         Dispo:  No follow-ups on file.   Medication Adjustments/Labs and Tests Ordered: Current medicines are reviewed at length with the patient today.   Concerns regarding medicines are outlined above.  Tests Ordered: Orders Placed This Encounter  Procedures   AMB Referral to [facility]   LONG TERM MONITOR (3-14 DAYS)   EKG 12-Lead   ECHOCARDIOGRAM COMPLETE   Medication Changes: Meds ordered this encounter  Medications   NIFEdipine (PROCARDIA-XL/NIFEDICAL-XL) 30 MG 24 hr tablet    Sig: Take 1 tablet (30 mg total) by mouth daily.    Dispense:  90 tablet    Refill:  1

## 2021-12-04 ENCOUNTER — Encounter: Payer: Self-pay | Admitting: Women's Health

## 2021-12-04 DIAGNOSIS — D563 Thalassemia minor: Secondary | ICD-10-CM | POA: Insufficient documentation

## 2021-12-04 HISTORY — DX: Thalassemia minor: D56.3

## 2021-12-12 ENCOUNTER — Ambulatory Visit (INDEPENDENT_AMBULATORY_CARE_PROVIDER_SITE_OTHER): Payer: Medicaid Other | Admitting: Advanced Practice Midwife

## 2021-12-12 ENCOUNTER — Encounter: Payer: Medicaid Other | Admitting: Advanced Practice Midwife

## 2021-12-12 VITALS — BP 133/83 | HR 104 | Wt 365.4 lb

## 2021-12-12 DIAGNOSIS — Z3A16 16 weeks gestation of pregnancy: Secondary | ICD-10-CM

## 2021-12-12 DIAGNOSIS — Z3480 Encounter for supervision of other normal pregnancy, unspecified trimester: Secondary | ICD-10-CM

## 2021-12-12 DIAGNOSIS — R002 Palpitations: Secondary | ICD-10-CM

## 2021-12-12 DIAGNOSIS — Z8759 Personal history of other complications of pregnancy, childbirth and the puerperium: Secondary | ICD-10-CM | POA: Insufficient documentation

## 2021-12-12 DIAGNOSIS — Z363 Encounter for antenatal screening for malformations: Secondary | ICD-10-CM

## 2021-12-12 DIAGNOSIS — Z98891 History of uterine scar from previous surgery: Secondary | ICD-10-CM

## 2021-12-12 NOTE — Progress Notes (Signed)
   LOW-RISK PREGNANCY VISIT Patient name: [name] [name] MRN [id number]  Date of birth: [birth year] Chief Complaint:   Routine Prenatal Visit  History of Present Illness:   [name] [name] is a 36 y.o. G9E0100 female at [redacted]w[redacted]d with an Estimated Date of Delivery: 05/27/22 being seen today for ongoing management of a low-risk pregnancy.  Today she reports  still with occasional heart palpitations and SOB . Contractions: Not present. Vag. Bleeding: None.   . denies leaking of fluid. Review of Systems:   Pertinent items are noted in HPI Denies abnormal vaginal discharge w/ itching/odor/irritation, headaches, visual changes, shortness of breath, chest pain, abdominal pain, severe nausea/vomiting, or problems with urination or bowel movements unless otherwise stated above. Pertinent History Reviewed:  Reviewed past medical,surgical, social, obstetrical and family history.  Reviewed problem list, medications and allergies. Physical Assessment:   Vitals:   12/12/21 1342  BP: 133/83  Pulse: (!) 104  Weight: (!) 365 lb 6.4 oz (165.7 kg)  Body mass index is 60.81 kg/m.        Physical Examination:   General appearance: Well appearing, and in no distress  Mental status: Alert, oriented to person, place, and time  Skin: Warm & dry  Cardiovascular: Normal heart rate noted  Respiratory: Normal respiratory effort, no distress  Abdomen: Soft, gravid, nontender  Pelvic: Cervical exam deferred         Extremities: Edema: None  Fetal Status: Fetal Heart Rate (bpm): 145        No results found for this or any previous visit (from the past 24 hour(s)).  Assessment & Plan:  1) Low-risk pregnancy [id number] at [redacted]w[redacted]d with an Estimated Date of Delivery: 05/27/22   2) Hx C/S with G2 for fetal indications, would like to TOLAC (no BTL)  3) Hx LGA, G1 weighed 10+11, no DM, no difficulty w delivery  4) Elevated BPs on home cuff, nl values in office; recommended bringing cuff to next visit; taking bASA  5)  Intermittent heart palpitations/SOB, has cards appt 5/26  6) Asthma, stable with Symbicort, Flonase, Zyrtec, and prn Albuterol   Meds: No orders of the defined types were placed in this encounter.  Labs/procedures today: AFP (Panorama resulted insufficient fetal sample x 2, doesn't want to try again)  Plan:  Continue routine obstetrical care   Reviewed: Preterm labor symptoms and general obstetric precautions including but not limited to vaginal bleeding, contractions, leaking of fluid and fetal movement were reviewed in detail with the patient.  All questions were answered. Has home bp cuff. Check bp weekly, let us know if >140/90.   Follow-up: Return in about 4 weeks (around 01/09/2022) for LROB, [place]: Anatomy.  Orders Placed This Encounter  Procedures   US OB Comp + 14 Wk   AFP TETRA   [name] [name] [facility] 12/12/2021 2:07 PM

## 2021-12-12 NOTE — Patient Instructions (Signed)
Kareli, thank you for choosing our office today! We appreciate the opportunity to meet your healthcare needs. You may receive a short survey by mail, e-mail, or through [employer]. If you are happy with your care we would appreciate if you could take just a few minutes to complete the survey questions. We read all of your comments and take your feedback very seriously. Thank you again for choosing our office.  Center for [employer] Team at [facility] at [facility] ([address] [place], [place] [address]) Entrance C, located off of [address] Free 24/7 valet parking  Go to [email or web address] to register for FREE online childbirth classes  Call the office [phone]) or go to [facility] if: You begin to severe cramping Your water breaks.  Sometimes it is a big gush of fluid, sometimes it is just a trickle that keeps getting your panties wet or running down your legs You have vaginal bleeding.  It is normal to have a small amount of spotting if your cervix was checked.   [facility] Pediatricians/Family Doctors [facility]): [address] Colette Ribas, [phone]           [facility]: [address], [phone]                [facility]): [address], [phone] (call to ask if accepting patients) [facility] Department: [address], [place], [phone]    [facility] Pediatricians/Family Doctors [facility]): [phone] [address], [phone] [facility]: [address] [place], [phone] [facility]: [address], [phone]  [facility] Doctors  [facility]): [phone] [facility]: [address] [phone]   [place] Doctors [facility]: [address] [phone]  [facility] Doctors  [facility]: [phone], [phone]  Home Blood Pressure Monitoring for Patients   Your provider has recommended that you check your blood pressure (BP) at least once a week at home. If you do not have a blood pressure cuff at home, one will be provided for you. Contact your provider if you have not received your monitor within 1 week.   Helpful Tips for Accurate Home Blood Pressure Checks  Don't smoke, exercise, or drink caffeine 30 minutes before checking your BP Use the restroom before checking your BP (a full bladder can raise your pressure) Relax in a comfortable upright chair Feet on the ground Left arm resting comfortably on a flat surface at the level of your heart Legs uncrossed Back supported Sit quietly and don't talk Place the cuff on your bare arm Adjust snuggly, so that only two fingertips can fit between your skin and the top of the cuff Check 2 readings separated by at least one minute Keep a log of your BP readings For a visual, please reference this diagram: http://ccnc.care/bpdiagram  Provider Name: [facility]     Phone: [phone]  Zone 1: ALL CLEAR  Continue to monitor your symptoms:  BP reading is less than 140 (top number) or less than 90 (bottom number)  No right upper stomach pain No headaches or seeing spots No feeling nauseated or throwing up No swelling in face and hands  Zone 2: CAUTION Call your doctor's office for any of the following:  BP reading is greater than 140 (top number) or greater than  90 (bottom number)  Stomach pain under your ribs in the middle or right side Headaches or seeing spots Feeling nauseated or throwing up Swelling in face and hands  Zone 3: EMERGENCY  Seek immediate medical care if you have any of the following:  BP reading is greater than160 (top number) or greater than 110 (bottom number) Severe headaches not improving with Tylenol Serious difficulty catching your breath Any worsening symptoms from  Zone 2     Second Trimester of Pregnancy The second trimester is from week 14 through week 27 (months 4 through 6). The second trimester is often a time when you feel your best. Your body has adjusted to being pregnant, and you begin to feel better physically. Usually, morning sickness has lessened or quit completely, you may have more energy, and you may have an increase in appetite. The second trimester is also a time when the fetus is growing rapidly. At the end of the sixth month, the fetus is about 9 inches long and weighs about 1 pounds. You will likely begin to feel the baby move (quickening) between 16 and 20 weeks of pregnancy. Body changes during your second trimester Your body continues to go through many changes during your second trimester. The changes vary from woman to woman. Your weight will continue to increase. You will notice your lower abdomen bulging out. You may begin to get stretch marks on your hips, abdomen, and breasts. You may develop headaches that can be relieved by medicines. The medicines should be approved by your health care provider. You may urinate more often because the fetus is pressing on your bladder. You may develop or continue to have heartburn as a result of your pregnancy. You may develop constipation because certain hormones are causing the muscles that push waste through your intestines to slow down. You may develop hemorrhoids or swollen, bulging veins (varicose veins). You may have back pain. This is caused by: Weight gain. Pregnancy hormones that are relaxing the joints in your pelvis. A shift in weight and the muscles that support your balance. Your breasts will continue to grow and they will continue to become tender. Your gums may bleed and may be sensitive to brushing and flossing. Dark spots or blotches (chloasma, mask of pregnancy) may develop on your face. This will likely fade after the baby is born. A dark line from your belly button to  the pubic area (linea nigra) may appear. This will likely fade after the baby is born. You may have changes in your hair. These can include thickening of your hair, rapid growth, and changes in texture. Some women also have hair loss during or after pregnancy, or hair that feels dry or thin. Your hair will most likely return to normal after your baby is born.  What to expect at prenatal visits During a routine prenatal visit: You will be weighed to make sure you and the fetus are growing normally. Your blood pressure will be taken. Your abdomen will be measured to track your baby's growth. The fetal heartbeat will be listened to. Any test results from the previous visit will be discussed.  Your health care provider may ask you: How you are feeling. If you are feeling the baby move. If you have had any abnormal symptoms, such as leaking fluid, bleeding, severe headaches, or abdominal cramping. If you are using any tobacco products, including cigarettes, chewing tobacco, and electronic cigarettes. If you have any questions.  Other tests that may be performed during   your second trimester include: Blood tests that check for: Low iron levels (anemia). High blood sugar that affects pregnant women (gestational diabetes) between 24 and 28 weeks. Rh antibodies. This is to check for a protein on red blood cells (Rh factor). Urine tests to check for infections, diabetes, or protein in the urine. An ultrasound to confirm the proper growth and development of the baby. An amniocentesis to check for possible genetic problems. Fetal screens for spina bifida and Down syndrome. HIV (human immunodeficiency virus) testing. Routine prenatal testing includes screening for HIV, unless you choose not to have this test.  Follow these instructions at home: Medicines Follow your health care provider's instructions regarding medicine use. Specific medicines may be either safe or unsafe to take during  pregnancy. Take a prenatal vitamin that contains at least 600 micrograms (mcg) of folic acid. If you develop constipation, try taking a stool softener if your health care provider approves. Eating and drinking Eat a balanced diet that includes fresh fruits and vegetables, whole grains, good sources of protein such as meat, eggs, or tofu, and low-fat dairy. Your health care provider will help you determine the amount of weight gain that is right for you. Avoid raw meat and uncooked cheese. These carry germs that can cause birth defects in the baby. If you have low calcium intake from food, talk to your health care provider about whether you should take a daily calcium supplement. Limit foods that are high in fat and processed sugars, such as fried and sweet foods. To prevent constipation: Drink enough fluid to keep your urine clear or pale yellow. Eat foods that are high in fiber, such as fresh fruits and vegetables, whole grains, and beans. Activity Exercise only as directed by your health care provider. Most women can continue their usual exercise routine during pregnancy. Try to exercise for 30 minutes at least 5 days a week. Stop exercising if you experience uterine contractions. Avoid heavy lifting, wear low heel shoes, and practice good posture. A sexual relationship may be continued unless your health care provider directs you otherwise. Relieving pain and discomfort Wear a good support bra to prevent discomfort from breast tenderness. Take warm sitz baths to soothe any pain or discomfort caused by hemorrhoids. Use hemorrhoid cream if your health care provider approves. Rest with your legs elevated if you have leg cramps or low back pain. If you develop varicose veins, wear support hose. Elevate your feet for 15 minutes, 3-4 times a day. Limit salt in your diet. Prenatal Care Write down your questions. Take them to your prenatal visits. Keep all your prenatal visits as told by your health  care provider. This is important. Safety Wear your seat belt at all times when driving. Make a list of emergency phone numbers, including numbers for family, friends, the hospital, and police and fire departments. General instructions Ask your health care provider for a referral to a local prenatal education class. Begin classes no later than the beginning of month 6 of your pregnancy. Ask for help if you have counseling or nutritional needs during pregnancy. Your health care provider can offer advice or refer you to specialists for help with various needs. Do not use hot tubs, steam rooms, or saunas. Do not douche or use tampons or scented sanitary pads. Do not cross your legs for long periods of time. Avoid cat litter boxes and soil used by cats. These carry germs that can cause birth defects in the baby and possibly loss of the   fetus by miscarriage or stillbirth. Avoid all smoking, herbs, alcohol, and unprescribed drugs. Chemicals in these products can affect the formation and growth of the baby. Do not use any products that contain nicotine or tobacco, such as cigarettes and e-cigarettes. If you need help quitting, ask your health care provider. Visit your dentist if you have not gone yet during your pregnancy. Use a soft toothbrush to brush your teeth and be gentle when you floss. Contact a health care provider if: You have dizziness. You have mild pelvic cramps, pelvic pressure, or nagging pain in the abdominal area. You have persistent nausea, vomiting, or diarrhea. You have a bad smelling vaginal discharge. You have pain when you urinate. Get help right away if: You have a fever. You are leaking fluid from your vagina. You have spotting or bleeding from your vagina. You have severe abdominal cramping or pain. You have rapid weight gain or weight loss. You have shortness of breath with chest pain. You notice sudden or extreme swelling of your face, hands, ankles, feet, or legs. You  have not felt your baby move in over an hour. You have severe headaches that do not go away when you take medicine. You have vision changes. Summary The second trimester is from week 14 through week 27 (months 4 through 6). It is also a time when the fetus is growing rapidly. Your body goes through many changes during pregnancy. The changes vary from woman to woman. Avoid all smoking, herbs, alcohol, and unprescribed drugs. These chemicals affect the formation and growth your baby. Do not use any tobacco products, such as cigarettes, chewing tobacco, and e-cigarettes. If you need help quitting, ask your health care provider. Contact your health care provider if you have any questions. Keep all prenatal visits as told by your health care provider. This is important. This information is not intended to replace advice given to you by your health care provider. Make sure you discuss any questions you have with your health care provider. Document Released: 07/02/2001 Document Revised: 12/14/2015 Document Reviewed: 09/08/2012 Elsevier Interactive Patient Education  2017 Elsevier Inc.  

## 2021-12-14 ENCOUNTER — Ambulatory Visit (INDEPENDENT_AMBULATORY_CARE_PROVIDER_SITE_OTHER): Payer: Medicaid Other | Admitting: Cardiology

## 2021-12-14 ENCOUNTER — Encounter: Payer: Self-pay | Admitting: Cardiology

## 2021-12-14 VITALS — BP 148/88 | HR 104 | Ht 65.0 in | Wt 370.4 lb

## 2021-12-14 DIAGNOSIS — R002 Palpitations: Secondary | ICD-10-CM | POA: Diagnosis not present

## 2021-12-14 DIAGNOSIS — R0602 Shortness of breath: Secondary | ICD-10-CM | POA: Diagnosis not present

## 2021-12-14 DIAGNOSIS — I10 Essential (primary) hypertension: Secondary | ICD-10-CM

## 2021-12-14 DIAGNOSIS — Z79899 Other long term (current) drug therapy: Secondary | ICD-10-CM

## 2021-12-14 MED ORDER — NIFEDIPINE ER OSMOTIC RELEASE 30 MG PO TB24: 30.0000 mg | ORAL_TABLET | Freq: Every day | ORAL | 1 refills | Status: DC

## 2021-12-14 NOTE — Patient Instructions (Signed)
Medication Instructions:   START TAKING NIFEDIPINE 30 MG BY MOUTH DAILY  *If you need a refill on your cardiac medications before your next appointment, please call your pharmacy*   You have been referred to SEE CHRIS PAVERO PHARMACIST AT OUR [facility] OFFICE IN 1-2 WEEKS PER DR. PEMBERTON--FOR MED MANAGEMENT AND HYPERTENSION FOLLOW-UP (MUST BE CHRIS ONLY-CARDIO OBSTETRICS PATIENT)   Testing/Procedures:  Your physician has requested that you have an echocardiogram. Echocardiography is a painless test that uses sound waves to create images of your heart. It provides your doctor with information about the size and shape of your heart and how well your heart's chambers and valves are working. This procedure takes approximately one hour. There are no restrictions for this procedure.  Cardiac-OB patient: to be performed by [place] or [place].    ZIO XT- Long Term Monitor Instructions  Your physician has requested you wear a ZIO patch monitor for 7  days.  This is a single patch monitor. [employer] supplies one patch monitor per enrollment. Additional stickers are not available. Please do not apply patch if you will be having a Nuclear Stress Test,  Echocardiogram, Cardiac CT, MRI, or Chest Xray during the period you would be wearing the  monitor. The patch cannot be worn during these tests. You cannot remove and re-apply the  ZIO XT patch monitor.  Your ZIO patch monitor will be mailed 3 day USPS to your address on file. It may take 3-5 days  to receive your monitor after you have been enrolled.  Once you have received your monitor, please review the enclosed instructions. Your monitor  has already been registered assigning a specific monitor serial # to you.  Billing and Patient Assistance Program Information  We have supplied [employer] with any of your insurance information on file for billing purposes. [employer] offers a sliding scale Patient Assistance Program for patients that do not have   insurance, or whose insurance does not completely cover the cost of the ZIO monitor.  You must apply for the Patient Assistance Program to qualify for this discounted rate.  To apply, please call [employer] at [phone], select option 4, select option 2, ask to apply for  Patient Assistance Program. Meredeth Ide will ask your household income, and how many people  are in your household. They will quote your out-of-pocket cost based on that information.  [employer] will also be able to set up a 27-month, interest-free payment plan if needed.  Applying the monitor   Shave hair from upper left chest.  Hold abrader disc by orange tab. Rub abrader in 40 strokes over the upper left chest as  indicated in your monitor instructions.  Clean area with 4 enclosed alcohol pads. Let dry.  Apply patch as indicated in monitor instructions. Patch will be placed under collarbone on left  side of chest with arrow pointing upward.  Rub patch adhesive wings for 2 minutes. Remove white label marked "1". Remove the white  label marked "2". Rub patch adhesive wings for 2 additional minutes.  While looking in a mirror, press and release button in center of patch. A small green light will  flash 3-4 times. This will be your only indicator that the monitor has been turned on.  Do not shower for the first 24 hours. You may shower after the first 24 hours.  Press the button if you feel a symptom. You will hear a small click. Record Date, Time and  Symptom in the Patient Logbook.  When you are  ready to remove the patch, follow instructions on the last 2 pages of Patient  Logbook. Stick patch monitor onto the last page of Patient Logbook.  Place Patient Logbook in the blue and white box. Use locking tab on box and tape box closed  securely. The blue and white box has prepaid postage on it. Please place it in the mailbox as  soon as possible. Your physician should have your test results approximately 7 days after the  monitor  has been mailed back to [facility].  Call [facility] Customer Care at [phone] if you have questions regarding  your ZIO XT patch monitor. Call them immediately if you see an orange light blinking on your  monitor.  If your monitor falls off in less than 4 days, contact our Monitor department at [phone].  If your monitor becomes loose or falls off after 4 days call [employer] at [phone] for  suggestions on securing your monitor   Follow-Up:  1.)  1-2 WEEKS WITH CHRIS PAVERO PHARMACIST AT OUR [facility] OFFICE FOR HTN AND MED MANAGEMENT (SEE CHRIS ONLY-CARDIO OB PATIENT)--PLEASE BRING YOUR BLOOD PRESSURE CUFF TO THIS VISIT.   2.)  1 MONTH WITH DR. Shari Prows HERE AT [facility]     Other Instructions  PLEASE CONTINUE MONITORING YOUR BLOOD PRESSURES AT HOME--PLEASE BRING YOUR BP CUFF TO YOUR APPOINTMENT WITH CHRIS PAVERO PHARMACIST IN 1-2 WEEKS    Important Information About Sugar

## 2021-12-18 ENCOUNTER — Ambulatory Visit (INDEPENDENT_AMBULATORY_CARE_PROVIDER_SITE_OTHER): Payer: Medicaid Other

## 2021-12-18 DIAGNOSIS — R0602 Shortness of breath: Secondary | ICD-10-CM

## 2021-12-18 DIAGNOSIS — R002 Palpitations: Secondary | ICD-10-CM

## 2021-12-18 NOTE — Progress Notes (Unsigned)
Enrolled patient for a 7 day Zio XT monitor to be mailed to patients home.  

## 2021-12-20 DIAGNOSIS — R002 Palpitations: Secondary | ICD-10-CM | POA: Diagnosis not present

## 2021-12-20 DIAGNOSIS — R0602 Shortness of breath: Secondary | ICD-10-CM | POA: Diagnosis not present

## 2021-12-20 LAB — AFP TETRA
DIA Mom Value: 1.03
DIA Value (EIA): 102.4 pg/mL
DSR (By Age)    1 IN: 374
DSR (Second Trimester) 1 IN: 1357
Gestational Age: 16.3 WEEKS
MSAFP Mom: 0.76
MSAFP: 18.7 ng/mL
MSHCG Mom: 1.34
MSHCG: 32482 m[IU]/mL
Maternal Age At EDD: 33.9 yr
Osb Risk: 10000
T18 (By Age): 1:1458 {titer}
Test Results:: NEGATIVE
Weight: 365 [lb_av]
uE3 Mom: 1.21
uE3 Value: 1 ng/mL

## 2021-12-21 ENCOUNTER — Telehealth: Payer: Self-pay | Admitting: Cardiology

## 2021-12-21 NOTE — Telephone Encounter (Signed)
Pt would like a callback regarding Monitor that she received. She has questions as to when she should take it off. Please advise

## 2021-12-21 NOTE — Telephone Encounter (Signed)
Returned call and answered all of patients questions about her monitor.

## 2021-12-21 NOTE — Telephone Encounter (Signed)
Patient called stating she monitor is starting to come off, she has only had it on for one day.  She wants to know what she should do.

## 2021-12-21 NOTE — Telephone Encounter (Signed)
Pt was calling to inform us that she has only been wearing her 7 day zio for a day and it is falling off.  Pt states she worked outside all day today and it got wet, making it fall off.   Informed the pt that being she has only had the device on for one day, she can call zio at the number on the box, and they will send her a new patch in the mail at no cost. Advised her to call them now to have this replaced. Advised her to wear this for the full 7 days when she receives the replacement patch. Went over zio instructions with her, which she had on her previous AVS with our office.  Reiterated to her that she has to avoid getting the monitor wet for the first 24 hrs, to avoid this falling off again. Went over skin prep with her as well. Pt verbalized understanding and agrees with this plan.  She states she will call zio now to have them send out a replacement patch.  Pt was appreciative for all the assistance provided.

## 2021-12-24 NOTE — Telephone Encounter (Signed)
Message sent to [facility] representative to please cancel charges on first monitor and mail the patient a replacement monitor.

## 2021-12-27 ENCOUNTER — Emergency Department (HOSPITAL_COMMUNITY): Payer: Medicaid Other

## 2021-12-27 ENCOUNTER — Emergency Department (HOSPITAL_COMMUNITY)
Admission: EM | Admit: 2021-12-27 | Discharge: 2021-12-28 | Disposition: A | Discharge status: Home / Self Care | Payer: Medicaid Other | Attending: Emergency Medicine | Admitting: Emergency Medicine

## 2021-12-27 ENCOUNTER — Other Ambulatory Visit: Payer: Self-pay

## 2021-12-27 ENCOUNTER — Encounter (HOSPITAL_COMMUNITY): Payer: Self-pay

## 2021-12-27 DIAGNOSIS — Z20822 Contact with and (suspected) exposure to covid-19: Secondary | ICD-10-CM | POA: Diagnosis not present

## 2021-12-27 DIAGNOSIS — Z7982 Long term (current) use of aspirin: Secondary | ICD-10-CM | POA: Insufficient documentation

## 2021-12-27 DIAGNOSIS — J069 Acute upper respiratory infection, unspecified: Secondary | ICD-10-CM | POA: Diagnosis not present

## 2021-12-27 DIAGNOSIS — R0602 Shortness of breath: Secondary | ICD-10-CM | POA: Diagnosis present

## 2021-12-27 LAB — COMPREHENSIVE METABOLIC PANEL
ALT: 60 U/L — ABNORMAL HIGH (ref 0–44)
AST: 40 U/L (ref 15–41)
Albumin: 2.8 g/dL — ABNORMAL LOW (ref 3.5–5.0)
Alkaline Phosphatase: 57 U/L (ref 38–126)
Anion gap: 8 (ref 5–15)
BUN: 11 mg/dL (ref 6–20)
CO2: 22 mmol/L (ref 22–32)
Calcium: 8.7 mg/dL — ABNORMAL LOW (ref 8.9–10.3)
Chloride: 108 mmol/L (ref 98–111)
Creatinine, Ser: 0.7 mg/dL (ref 0.44–1.00)
GFR, Estimated: >60 mL/min (ref >60)
Glucose, Bld: 93 mg/dL (ref 70–99)
Potassium: 3.4 mmol/L — ABNORMAL LOW (ref 3.5–5.1)
Sodium: 138 mmol/L (ref 135–145)
Total Bilirubin: 0.2 mg/dL — ABNORMAL LOW (ref 0.3–1.2)
Total Protein: 6 g/dL — ABNORMAL LOW (ref 6.5–8.1)

## 2021-12-27 LAB — CBC WITH DIFFERENTIAL/PLATELET
Abs Immature Granulocytes: 0.04 10*3/uL (ref 0.00–0.07)
Basophils Absolute: 0 10*3/uL (ref 0.0–0.1)
Basophils Relative: 0 %
Eosinophils Absolute: 0.1 10*3/uL (ref 0.0–0.5)
Eosinophils Relative: 1 %
HCT: 36.2 % (ref 36.0–46.0)
Hemoglobin: 11.4 g/dL — ABNORMAL LOW (ref 12.0–15.0)
Immature Granulocytes: 0 %
Lymphocytes Relative: 20 %
Lymphs Abs: 1.8 10*3/uL (ref 0.7–4.0)
MCH: 26.1 pg (ref 26.0–34.0)
MCHC: 31.5 g/dL (ref 30.0–36.0)
MCV: 82.8 fL (ref 80.0–100.0)
Monocytes Absolute: 0.6 10*3/uL (ref 0.1–1.0)
Monocytes Relative: 6 %
Neutro Abs: 6.4 10*3/uL (ref 1.7–7.7)
Neutrophils Relative %: 73 %
Platelets: 228 10*3/uL (ref 150–400)
RBC: 4.37 MIL/uL (ref 3.87–5.11)
RDW: 15.9 % — ABNORMAL HIGH (ref 11.5–15.5)
WBC: 8.9 10*3/uL (ref 4.0–10.5)
nRBC: 0 % (ref 0.0–0.2)

## 2021-12-27 LAB — TROPONIN I (HIGH SENSITIVITY): Troponin I (High Sensitivity): 3 ng/L (ref <18)

## 2021-12-27 LAB — RESP PANEL BY RT-PCR (FLU A&B, COVID) ARPGX2
Influenza A by PCR: NEGATIVE
Influenza B by PCR: NEGATIVE
SARS Coronavirus 2 by RT PCR: NEGATIVE

## 2021-12-27 MED ORDER — ALBUTEROL SULFATE HFA 108 (90 BASE) MCG/ACT IN AERS
2.0000 | INHALATION_SPRAY | Freq: Once | RESPIRATORY_TRACT | Status: AC
  Administered 2021-12-27: 2 via RESPIRATORY_TRACT
  Filled 2021-12-27: qty 6.7

## 2021-12-27 MED ORDER — POTASSIUM CHLORIDE CRYS ER 20 MEQ PO TBCR
20.0000 meq | EXTENDED_RELEASE_TABLET | Freq: Once | ORAL | Status: AC
  Administered 2021-12-28: 20 meq via ORAL
  Filled 2021-12-27: qty 1

## 2021-12-27 NOTE — ED Notes (Signed)
Pt given cup of ice per request  

## 2021-12-27 NOTE — Discharge Instructions (Addendum)
If you develop high fever, severe cough or cough with blood, trouble breathing, severe headache, neck pain/stiffness, vomiting, or any other new/concerning symptoms then return to the ER for evaluation   You may use the albuterol inhaler 2 puffs every 4 hours as needed for cough, shortness of breath or chest tightness.

## 2021-12-27 NOTE — ED Triage Notes (Signed)
Pt presents to ED with sob that started several months, but has gotten worse per pt. Pt was seen by cardiologists on 5/30 for the same and placed on heart monitor, pt reports the cardiologist says it's probably because of the extra weight she has put on with pregnancy. Pt is currently [redacted] weeks pregnant. Pt is morbidly obese.

## 2021-12-27 NOTE — ED Provider Notes (Signed)
[facility] EMERGENCY DEPARTMENT Provider Note   CSN: [id number] Arrival date & time: 12/27/21  2046     History  Chief Complaint  Patient presents with   Shortness of Breath    Since 5/30     [name] [name] is a 36 y.o. female.  HPI 36 year old female who is currently about [redacted] weeks pregnant presents with shortness of breath.  She states she has been short of breath ever since she found out she is pregnant, for over 10 weeks.  However she feels like things are worsening and her allergies are getting worse over the last week or so.  She has had some sore throat, coughing up yellow sputum, and more shortness of breath.  Its mostly with exertion.  No leg swelling.  She has seen cardiology previously for the shortness of breath and heart palpitations.  She does feel like there is some pressure on her chest over this last week and she thinks she has a mild case of asthma.  She is on cetirizine which she has been taking but has been out of her Singulair and does not have a rescue inhaler anymore.  Home Medications Prior to Admission medications   Medication Sig Start Date End Date Taking? Authorizing Provider  aspirin EC 81 MG tablet Take 1 tablet (81 mg total) by mouth daily. Swallow whole. 11/14/21   Marny LowensteinWenzel, Julie N, PA-C  Blood Pressure Monitor MISC For regular home bp monitoring during pregnancy Needs large cuff 11/14/21   Marny LowensteinWenzel, Julie N, PA-C  cetirizine (ZYRTEC ALLERGY) 10 MG tablet Take 1 tablet (10 mg total) by mouth daily. 10/24/21   Horton, Mayer Maskerourtney F, MD  cetirizine (ZYRTEC) 10 MG tablet Take 10 mg by mouth daily.    [provider]  Doxylamine-Pyridoxine (DICLEGIS) 10-10 MG TBEC 2 tabs q hs, if sx persist add 1 tab q am on day 3, if sx persist add 1 tab q afternoon on day 4 10/22/21   Cheral MarkerBooker, Kimberly R, CNM  fluticasone (FLONASE) 50 MCG/ACT nasal spray Place 1 spray into both nostrils daily. 10/24/21   Horton, Mayer Maskerourtney F, MD  FOLIC ACID PO Take by mouth.    [provider]  MELATONIN PO Take by mouth.    [provider]  montelukast (SINGULAIR) 10 MG tablet Take 10 mg by mouth daily.    [provider]  montelukast (SINGULAIR) 10 MG tablet Take 1 tablet by mouth at bedtime.    [provider]  NIFEdipine (PROCARDIA-XL/NIFEDICAL-XL) 30 MG 24 hr tablet Take 1 tablet (30 mg total) by mouth daily. 12/14/21   Meriam SpraguePemberton, Heather E, MD  Prenatal Vit-Fe Fumarate-FA (PRENATAL VITAMIN PO) Take by mouth.    [provider]      Allergies    Shellfish allergy    Review of Systems   Review of Systems  Constitutional:  Negative for fever.  HENT:  Positive for sore throat.   Respiratory:  Positive for cough and shortness of breath.   Cardiovascular:  Positive for chest pain. Negative for leg swelling.  Gastrointestinal:  Positive for vomiting (with coughing).    Physical Exam Updated Vital Signs BP 110/81   Pulse 93   Temp 98.1 F (36.7 C) (Oral)   Resp 18   Ht 5\' 5"  (1.651 m)   Wt (!) 166.5 kg   LMP 08/20/2021   SpO2 100%   BMI 61.07 kg/m  Physical Exam Vitals and nursing note reviewed.  Constitutional:      General:  She is not in acute distress.    Appearance: She is well-developed. She is obese. She is not ill-appearing or diaphoretic.  HENT:     Head: Normocephalic and atraumatic.     Mouth/Throat:     Pharynx: Oropharynx is clear. No pharyngeal swelling or oropharyngeal exudate.  Cardiovascular:     Rate and Rhythm: Normal rate and regular rhythm.     Heart sounds: Normal heart sounds.  Pulmonary:     Effort: Pulmonary effort is normal.     Breath sounds: Normal breath sounds.     Comments: Perhaps slight occasional wheezes but not consistent Abdominal:     Palpations: Abdomen is soft.     Tenderness: There is no abdominal tenderness.  Musculoskeletal:     Right lower leg: No edema.     Left lower leg: No edema.  Skin:    General: Skin is warm and dry.  Neurological:     Mental Status:  She is alert.     ED Results / Procedures / Treatments   Labs (all labs ordered are listed, but only abnormal results are displayed) Labs Reviewed  COMPREHENSIVE METABOLIC PANEL - Abnormal; Notable for the following components:      Result Value   Potassium 3.4 (*)    Calcium 8.7 (*)    Total Protein 6.0 (*)    Albumin 2.8 (*)    ALT 60 (*)    Total Bilirubin 0.2 (*)    All other components within normal limits  CBC WITH DIFFERENTIAL/PLATELET - Abnormal; Notable for the following components:   Hemoglobin 11.4 (*)    RDW 15.9 (*)    All other components within normal limits  RESP PANEL BY RT-PCR (FLU A&B, COVID) ARPGX2  TROPONIN I (HIGH SENSITIVITY)    EKG EKG Interpretation  Date/Time:  Thursday December 27 2021 21:25:47 EDT Ventricular Rate:  93 PR Interval:  149 QRS Duration: 86 QT Interval:  357 QTC Calculation: 444 R Axis:   59 Text Interpretation: Sinus rhythm no acute ST/T changes Confirmed by Pricilla Loveless [phone]) on 12/27/2021 9:35:01 PM  Radiology DG Chest 2 View  Result Date: 12/27/2021 CLINICAL DATA:  Shortness of breath. EXAM: CHEST - 2 VIEW COMPARISON:  Dec 14, 2020 FINDINGS: The heart size and mediastinal contours are within normal limits. Low lung volumes are noted. Both lungs are clear. The visualized skeletal structures are unremarkable. IMPRESSION: No active cardiopulmonary disease. Electronically Signed   By: Aram Candela M.D.   On: 12/27/2021 23:07    Procedures Procedures    Medications Ordered in ED Medications  potassium chloride SA (KLOR-CON M) CR tablet 20 mEq (has no administration in time range)  albuterol (VENTOLIN HFA) 108 (90 Base) MCG/ACT inhaler 2 puff (2 puffs Inhalation Given 12/27/21 2332)    ED Course/ Medical Decision Making/ A&P                           Medical Decision Making Amount and/or Complexity of Data Reviewed External Data Reviewed: notes. Labs: ordered. Radiology: ordered and independent interpretation  performed.  Risk Prescription drug management.   Chart reviewed.  She has been seen by cardiology and worked up for this shortness of breath with a Biopatch and an echo that has not yet been performed.  However I do not think she has CHF today.  Her symptoms are more URI-like.  She does feel little better with the 2 puffs of albuterol and feels like the  chest pressure has gotten better.  She is not hypoxic or in distress.  Vital signs are normal now.  My suspicion for PE, ACS, bacterial pneumonia is all quite low.  At this point, she feels well enough for discharge.  We will give return precautions and she can take the inhaler home with her.  Labs including troponin are pretty unremarkable besides mild hypokalemia and anemia.        Final Clinical Impression(s) / ED Diagnoses Final diagnoses:  Upper respiratory tract infection, unspecified type    Rx / DC Orders ED Discharge Orders     None         Pricilla Loveless, MD 12/27/21 2354

## 2021-12-28 ENCOUNTER — Ambulatory Visit: Payer: Medicaid Other

## 2021-12-31 ENCOUNTER — Ambulatory Visit
Admission: EM | Admit: 2021-12-31 | Discharge: 2021-12-31 | Disposition: A | Discharge status: Home / Self Care | Payer: Medicaid Other | Attending: Nurse Practitioner | Admitting: Nurse Practitioner

## 2021-12-31 DIAGNOSIS — R052 Subacute cough: Secondary | ICD-10-CM | POA: Insufficient documentation

## 2021-12-31 DIAGNOSIS — J029 Acute pharyngitis, unspecified: Secondary | ICD-10-CM | POA: Insufficient documentation

## 2021-12-31 LAB — POCT RAPID STREP A (OFFICE): Rapid Strep A Screen: NEGATIVE

## 2021-12-31 MED ORDER — DEXTROMETHORPHAN HBR 15 MG/5ML PO SYRP
5.0000 mL | ORAL_SOLUTION | Freq: Three times a day (TID) | ORAL | 0 refills | Status: DC | PRN
Start: 2021-12-31 — End: 2022-02-28

## 2021-12-31 MED ORDER — CETIRIZINE HCL 10 MG PO TABS: 10.0000 mg | ORAL_TABLET | Freq: Every day | ORAL | 0 refills | Status: DC

## 2021-12-31 MED ORDER — FLUTICASONE PROPIONATE 50 MCG/ACT NA SUSP: 1.0000 | Freq: Every day | NASAL | 0 refills | Status: DC

## 2021-12-31 MED ORDER — SALINE SPRAY 0.65 % NA SOLN: 1.0000 | NASAL | 0 refills | Status: DC | PRN

## 2021-12-31 NOTE — ED Triage Notes (Addendum)
Pt states she has been coughing, runny nose and sore throat for about a week  Pt states she is [redacted] weeks pregnant  Pt states that now her throat is sore to the touch and swallowing hurts  Pt states she is coughing so hard that she is vomiting with incontinence   Pt states she has tried OTC throat spays and cough drops  Pt states she went to [facility] on Thursday and she was tested for Covid/Flu which was negative and they gave her an inhaler

## 2021-12-31 NOTE — Discharge Instructions (Addendum)
-   The rapid strep throat test today is negative; we are sending for a throat culture and will let you know if this comes back positive in a couple of days -Please resume the allergy regimen including cetirizine 10 mg daily and Flonase 1 spray each nostril daily -You can use Ocean nasal spray as much as needed for congestion -Dextromethorphan can be used every 8 hours as needed for cough on a very short-term basis

## 2021-12-31 NOTE — ED Provider Notes (Signed)
[facility]    CSN: [id number] Arrival date & time: 12/31/21  1325      History   Chief Complaint Chief Complaint  Patient presents with   Cough    Sore throat and cough    HPI [name] [name] is a 36 y.o. female.   Patient presents today for severe sore throat and dry cough that has been ongoing for the past week.  Reports she was seen in the ER and given a prescription for albuterol inhaler that she has been using which has helped with the wheezing and shortness of breath.  She reports now, feels like she is swallowing glass.  Also reports she recently ran out of her allergy medication, so she started taking her son's the past couple of days.  She denies fevers, body aches/chills, significant sinus pressure or congestion, chest pain, shortness of breath, nausea/vomiting.  She has been taking some over-the-counter medication, however when she went to the pharmacy today, the pharmacist told her she needed to be seen in person.    Past Medical History:  Diagnosis Date   Abnormal Pap smear    Anxiety    Bipolar 1 disorder (HCC)    Depression    GERD (gastroesophageal reflux disease)    IUD (intrauterine device) in place 04/12/2013   Had IUD inserted 03/29/13 could not feel strings went to ER 9/18 and KUB saw IUD still could not see string, can't see now but seen in US   Kidney infection    Morbid obesity ([place])    Pregnancy    UTI (urinary tract infection)     Patient Active Problem List   Diagnosis Date Noted   History of delivery of macrosomal infant 12/12/2021   Alpha thalassemia silent carrier 12/04/2021   Bipolar disorder ([place]) 11/14/2021   Generalized anxiety disorder 11/14/2021   Obesity in pregnancy, antepartum 11/14/2021   Previous cesarean section 11/14/2021   Heart palpitations 11/14/2021   History of pyelonephritis 11/08/2021   Encounter for supervision of normal pregnancy, antepartum 11/08/2021   Rh negative state in antepartum period 11/08/2021     Past Surgical History:  Procedure Laterality Date   CESAREAN SECTION     HYSTEROSCOPY N/A 01/09/2021   Procedure: HYSTEROSCOPY;  Surgeon: Janyth Pupa, DO;  Location: [facility];  Service: Gynecology;  Laterality: N/A;   IUD REMOVAL N/A 01/09/2021   Procedure: INTRAUTERINE DEVICE (IUD) REMOVAL;  Surgeon: Janyth Pupa, DO;  Location: [facility];  Service: Gynecology;  Laterality: N/A;   TOOTH EXTRACTION      OB History     Gravida  5   Para  2   Term  1   Preterm  1   AB  2   Living  2      SAB  2   IAB  0   Ectopic  0   Multiple  0   Live Births  2            Home Medications    Prior to Admission medications   Medication Sig Start Date End Date Taking? Authorizing Provider  dextromethorphan 15 MG/5ML syrup Take 5 mLs (15 mg total) by mouth 3 (three) times daily as needed for cough. 12/31/21  Yes Noemi Chapel A, NP  sodium chloride (OCEAN) 0.65 % SOLN nasal spray Place 1 spray into both nostrils as needed for congestion. 12/31/21  Yes Eulogio Bear, NP  aspirin EC 81 MG tablet Take 1 tablet (81 mg total) by mouth daily.  Swallow whole. 11/14/21   Luvenia Redden, PA-C  Blood Pressure Monitor MISC For regular home bp monitoring during pregnancy Needs large cuff 11/14/21   Luvenia Redden, PA-C  cetirizine (ZYRTEC) 10 MG tablet Take 1 tablet (10 mg total) by mouth daily. 12/31/21   Eulogio Bear, NP  Doxylamine-Pyridoxine (DICLEGIS) 10-10 MG TBEC 2 tabs q hs, if sx persist add 1 tab q am on day 3, if sx persist add 1 tab q afternoon on day 4 10/22/21   Roma Schanz, CNM  fluticasone (FLONASE) 50 MCG/ACT nasal spray Place 1 spray into both nostrils daily. 12/31/21   Eulogio Bear, NP  FOLIC ACID PO Take by mouth.    [provider]  MELATONIN PO Take by mouth.    [provider]  montelukast (SINGULAIR) 10 MG tablet Take 10 mg by mouth daily.    [provider]  montelukast (SINGULAIR) 10 MG tablet Take 1 tablet by  mouth at bedtime.    [provider]  NIFEdipine (PROCARDIA-XL/NIFEDICAL-XL) 30 MG 24 hr tablet Take 1 tablet (30 mg total) by mouth daily. 12/14/21   Freada Bergeron, MD  Prenatal Vit-Fe Fumarate-FA (PRENATAL VITAMIN PO) Take by mouth.    [provider]    Family History Family History  Problem Relation Age of Onset   Hypertension Father    Diabetes Father    Congestive Heart Failure Father    Diabetes Paternal Aunt    Diabetes Paternal Uncle    Hypertension Paternal Grandmother    Diabetes Paternal Grandmother    Diabetes Paternal Grandfather     Social History Social History   Tobacco Use   Smoking status: Former    Types: Cigarettes    Quit date: 05/21/2007    Years since quitting: 14.6   Smokeless tobacco: Never  Vaping Use   Vaping Use: Never used  Substance Use Topics   Alcohol use: No   Drug use: Not Currently    Types: Marijuana    Comment: 3 times a day     Allergies   Shellfish allergy   Review of Systems Review of Systems Per HPI  Physical Exam Triage Vital Signs ED Triage Vitals  Enc Vitals Group     BP 12/31/21 1338 (!) 127/58     Pulse Rate 12/31/21 1338 (!) 103     Resp 12/31/21 1338 20     Temp 12/31/21 1338 98.2 F (36.8 C)     Temp Source 12/31/21 1338 Oral     SpO2 12/31/21 1338 97 %     Weight --      Height --      Head Circumference --      Peak Flow --      Pain Score 12/31/21 1334 4     Pain Loc --      Pain Edu? --      Excl. in [place]? --    No data found.  Updated Vital Signs BP (!) 127/58 (BP Location: Right Arm)   Pulse (!) 103   Temp 98.2 F (36.8 C) (Oral)   Resp 20   LMP 08/20/2021   SpO2 97%   Visual Acuity Right Eye Distance:   Left Eye Distance:   Bilateral Distance:    Right Eye Near:   Left Eye Near:    Bilateral Near:     Physical Exam Vitals and nursing note reviewed.  Constitutional:      General: She is not in  acute distress.    Appearance: She is obese. She is not  toxic-appearing.  HENT:     Head: Normocephalic and atraumatic.     Right Ear: Tympanic membrane, ear canal and external ear normal. There is no impacted cerumen.     Left Ear: Tympanic membrane, ear canal and external ear normal. There is no impacted cerumen.     Nose: Nose normal. No congestion.     Mouth/Throat:     Mouth: Mucous membranes are moist.     Pharynx: Oropharynx is clear. Posterior oropharyngeal erythema present.     Tonsils: 2+ on the right. 2+ on the left.  Eyes:     General: No scleral icterus.    Extraocular Movements: Extraocular movements intact.     Pupils: Pupils are equal, round, and reactive to light.  Cardiovascular:     Rate and Rhythm: Normal rate and regular rhythm.  Pulmonary:     Effort: Pulmonary effort is normal. No respiratory distress.     Breath sounds: Normal breath sounds. No wheezing, rhonchi or rales.  Musculoskeletal:     Cervical back: Normal range of motion.  Lymphadenopathy:     Cervical: No cervical adenopathy.  Skin:    General: Skin is warm and dry.     Capillary Refill: Capillary refill takes less than 2 seconds.     Coloration: Skin is not jaundiced or pale.     Findings: No erythema.  Neurological:     Mental Status: She is alert and oriented to person, place, and time.  Psychiatric:        Behavior: Behavior is cooperative.      UC Treatments / Results  Labs (all labs ordered are listed, but only abnormal results are displayed) Labs Reviewed  CULTURE, GROUP A STREP [facility])  POCT RAPID STREP A (OFFICE)    EKG   Radiology No results found.  Procedures Procedures (including critical care time)  Medications Ordered in UC Medications - No data to display  Initial Impression / Assessment and Plan / UC Course  I have reviewed the triage vital signs and the nursing notes.  Pertinent labs & imaging results that were available during my care of the patient were reviewed by me and considered in my medical decision  making (see chart for details).    Rapid strep throat test today is negative; will send for culture and treat if it comes back positive with amoxicillin 500 mg twice daily for 10 days.  Suspect postviral cough syndrome; treat with dextromethorphan every 8 hours as needed.  Encouraged her to resume allergy regimen including cetirizine daily and Flonase daily.  Can use nasal saline to help with postnasal drainage. Final Clinical Impressions(s) / UC Diagnoses   Final diagnoses:  Subacute cough  Acute pharyngitis, unspecified etiology     Discharge Instructions      - The rapid strep throat test today is negative; we are sending for a throat culture and will let you know if this comes back positive in a couple of days -Please resume the allergy regimen including cetirizine 10 mg daily and Flonase 1 spray each nostril daily -You can use Ocean nasal spray as much as needed for congestion -Dextromethorphan can be used every 8 hours as needed for cough on a very short-term basis    ED Prescriptions     Medication Sig Dispense Auth. Provider   cetirizine (ZYRTEC) 10 MG tablet Take 1 tablet (10 mg total) by mouth daily. 90 tablet Noemi Chapel  A, NP   fluticasone (FLONASE) 50 MCG/ACT nasal spray Place 1 spray into both nostrils daily. 16 g Noemi Chapel A, NP   sodium chloride (OCEAN) 0.65 % SOLN nasal spray Place 1 spray into both nostrils as needed for congestion. 88 mL Noemi Chapel A, NP   dextromethorphan 15 MG/5ML syrup Take 5 mLs (15 mg total) by mouth 3 (three) times daily as needed for cough. 118 mL Eulogio Bear, NP      PDMP not reviewed this encounter.   Eulogio Bear, NP 12/31/21 1404

## 2022-01-01 ENCOUNTER — Ambulatory Visit: Payer: Medicaid Other | Admitting: Family Medicine

## 2022-01-02 ENCOUNTER — Ambulatory Visit: Payer: Medicaid Other

## 2022-01-02 ENCOUNTER — Encounter: Payer: Self-pay | Admitting: Pharmacist

## 2022-01-02 ENCOUNTER — Ambulatory Visit (INDEPENDENT_AMBULATORY_CARE_PROVIDER_SITE_OTHER): Payer: Medicaid Other | Admitting: Pharmacist

## 2022-01-02 VITALS — BP 125/88 | HR 101 | Wt 362.2 lb

## 2022-01-02 DIAGNOSIS — Z3480 Encounter for supervision of other normal pregnancy, unspecified trimester: Secondary | ICD-10-CM | POA: Diagnosis not present

## 2022-01-02 DIAGNOSIS — I10 Essential (primary) hypertension: Secondary | ICD-10-CM | POA: Diagnosis not present

## 2022-01-02 NOTE — Progress Notes (Signed)
Patient ID: [name] [name]                 DOB: 1986-03-16                      MRN: [id number]     HPI: [name] [name] is a 36 y.o. female referred by Dr. Shari Prows for cardio obstetrics BP management.  Patient is currently 19 weeks 2 days pregnant. Estimated Date of Delivery: 05/27/22 by LMP. This patient's fifth pregnany. Has two living children. Oldest son is 24 years old.   Patient presents today in good spirits. Currently wearing a Zio patch for last few days. Had initially placed it on a week ahgo but fell off.  Has been checking BP at home and reports readings are typically around 110/80.    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 OBGYN is at [facility] in [place]. Does not currently have a PCP. Has appointment to be established with new PCP next week.  Has been seen in ED multiple times recently for URTI, allergies, and dental pain. Recently had multiple teeth extracted and completed course of clindamycin. Continues to have a cough.  Reports blood pressure has started to come down after teeth were removed.  Current HTN meds:  Nifedipine 30mg  daily   Wt Readings from Last 3 Encounters:  12/27/21 (!) 367 lb (166.5 kg)  12/14/21 (!) 370 lb 6.4 oz (168 kg)  12/12/21 (!) 365 lb 6.4 oz (165.7 kg)   BP Readings from Last 3 Encounters:  12/31/21 (!) 127/58  12/27/21 110/81  12/14/21 (!) 148/88   Pulse Readings from Last 3 Encounters:  12/31/21 (!) 103  12/27/21 93  12/14/21 (!) 104    Renal function: Estimated Creatinine Clearance: 159.2 mL/min (by C-G formula based on SCr of 0.7 mg/dL).  Past Medical History:  Diagnosis Date   Abnormal Pap smear    Anxiety    Bipolar 1 disorder (HCC)    Depression    GERD (gastroesophageal reflux disease)    IUD (intrauterine device) in place 04/12/2013   Had IUD inserted 03/29/13 could not feel strings went to ER 9/18 and KUB saw IUD still could not see string, can't see now but seen in 10/18   Kidney infection    Morbid  obesity (HCC)    Pregnancy    UTI (urinary tract infection)     Current Outpatient Medications on File Prior to Visit  Medication Sig Dispense Refill   aspirin EC 81 MG tablet Take 1 tablet (81 mg total) by mouth daily. Swallow whole. 30 tablet 11   Blood Pressure Monitor MISC For regular home bp monitoring during pregnancy Needs large cuff 1 each 0   cetirizine (ZYRTEC) 10 MG tablet Take 1 tablet (10 mg total) by mouth daily. 90 tablet 0   dextromethorphan 15 MG/5ML syrup Take 5 mLs (15 mg total) by mouth 3 (three) times daily as needed for cough. 118 mL 0   Doxylamine-Pyridoxine (DICLEGIS) 10-10 MG TBEC 2 tabs q hs, if sx persist add 1 tab q am on day 3, if sx persist add 1 tab q afternoon on day 4 100 tablet 6   fluticasone (FLONASE) 50 MCG/ACT nasal spray Place 1 spray into both nostrils daily. 16 g 0   FOLIC ACID PO Take by mouth.     MELATONIN PO Take by mouth.     montelukast (SINGULAIR) 10 MG tablet Take 10 mg by mouth daily.  montelukast (SINGULAIR) 10 MG tablet Take 1 tablet by mouth at bedtime.     NIFEdipine (PROCARDIA-XL/NIFEDICAL-XL) 30 MG 24 hr tablet Take 1 tablet (30 mg total) by mouth daily. 90 tablet 1   Prenatal Vit-Fe Fumarate-FA (PRENATAL VITAMIN PO) Take by mouth.     sodium chloride (OCEAN) 0.65 % SOLN nasal spray Place 1 spray into both nostrils as needed for congestion. 88 mL 0   No current facility-administered medications on file prior to visit.    Allergies  Allergen Reactions   Shellfish Allergy Anaphylaxis    Throat swelling     Assessment/Plan:  1. Hypertension -  Patient BP in room 125/88 which is at goal for pregnancy.  Pulse rate slightly elevated. However patient currently wearing monitor so will await results before making any pharmacotherapeutic additions. Is tolerating nifedipine well.  Advised to continue to monitor BP at home and to call clinic if BP >160 consistently.  Has follow up scheduled with Dr Shari Prows scheduled for 2 weeks.  No  changes needed at this time.  Continue nifedipine 30mg  daily Recheck with Dr in 2 weeks  Shari Prows, PharmD, BCACP, CDCES, CPP [address], [address] [place], [place], [place] Phone: [phone], Fax: [phone]

## 2022-01-02 NOTE — Patient Instructions (Addendum)
It was nice meeting you today  Your blood pressure is much improved  Continue to check your blood pressure at home.  We would like to keep your blood pressure between 120-160/80-110  Continue your nifedipine 30mg  once a day  We will not make any changes today until we get the results back from your monitor  Please call with any questions  , PharmD, BCACP, CDCES, CPP [address], [address] [place], [place], [place] Phone: [phone], Fax: [phone]

## 2022-01-03 ENCOUNTER — Encounter: Payer: Self-pay | Admitting: Women's Health

## 2022-01-03 LAB — CULTURE, GROUP A STREP (THRC)

## 2022-01-07 ENCOUNTER — Encounter (HOSPITAL_COMMUNITY): Payer: Self-pay

## 2022-01-07 ENCOUNTER — Encounter (HOSPITAL_COMMUNITY): Payer: Self-pay | Admitting: Cardiology

## 2022-01-07 ENCOUNTER — Ambulatory Visit (HOSPITAL_COMMUNITY): Payer: Medicaid Other | Attending: Internal Medicine

## 2022-01-07 NOTE — Progress Notes (Signed)
Verified appointment "no show" status with AWilleen Cass at 11:34.

## 2022-01-08 ENCOUNTER — Ambulatory Visit (INDEPENDENT_AMBULATORY_CARE_PROVIDER_SITE_OTHER): Payer: Medicaid Other | Admitting: Family Medicine

## 2022-01-08 ENCOUNTER — Encounter: Payer: Self-pay | Admitting: Family Medicine

## 2022-01-08 VITALS — BP 118/68 | HR 107 | Ht 64.5 in | Wt 365.4 lb

## 2022-01-08 DIAGNOSIS — Z3A20 20 weeks gestation of pregnancy: Secondary | ICD-10-CM

## 2022-01-08 DIAGNOSIS — R002 Palpitations: Secondary | ICD-10-CM

## 2022-01-08 DIAGNOSIS — J302 Other seasonal allergic rhinitis: Secondary | ICD-10-CM

## 2022-01-08 DIAGNOSIS — J4520 Mild intermittent asthma, uncomplicated: Secondary | ICD-10-CM

## 2022-01-08 DIAGNOSIS — O99210 Obesity complicating pregnancy, unspecified trimester: Secondary | ICD-10-CM | POA: Diagnosis not present

## 2022-01-08 DIAGNOSIS — R0602 Shortness of breath: Secondary | ICD-10-CM | POA: Diagnosis not present

## 2022-01-08 DIAGNOSIS — J45909 Unspecified asthma, uncomplicated: Secondary | ICD-10-CM | POA: Insufficient documentation

## 2022-01-08 MED ORDER — CETIRIZINE HCL 10 MG PO TABS: 10.0000 mg | ORAL_TABLET | Freq: Every day | ORAL | 0 refills | Status: AC

## 2022-01-08 MED ORDER — UNABLE TO FIND: 0 refills | Status: DC

## 2022-01-08 MED ORDER — ALBUTEROL SULFATE HFA 108 (90 BASE) MCG/ACT IN AERS: 2.0000 | INHALATION_SPRAY | Freq: Four times a day (QID) | RESPIRATORY_TRACT | 0 refills | Status: AC | PRN

## 2022-01-08 MED ORDER — MONTELUKAST SODIUM 10 MG PO TABS: 10.0000 mg | ORAL_TABLET | Freq: Every day | ORAL | 1 refills | Status: DC

## 2022-01-08 NOTE — Assessment & Plan Note (Signed)
Diagnosed at the age of 48 C/o of sob with exertion Rescue inhaler order

## 2022-01-08 NOTE — Assessment & Plan Note (Signed)
C/o of hip and back pain Will like an order for maternity belly band Order placed

## 2022-01-08 NOTE — Patient Instructions (Addendum)
I appreciate the opportunity to provide care to you today!    Follow up:  3 months  Labs:will get labs at your next visit  Please pick up your medication at the pharmacy     Please continue to a heart-healthy diet and increase your physical activities. Try to exercise for at least three times a week.      It was a pleasure to see you and I look forward to continuing to work together on your health and well-being. Please do not hesitate to call the office if you need care or have questions about your care.   Have a wonderful day and week. With Gratitude, Gilmore Laroche MSN, FNP-BC'

## 2022-01-08 NOTE — Assessment & Plan Note (Signed)
Hx of SOB with exertion and palpitations  Following up with cardiology with a scheduled echo on 01/25/22

## 2022-01-08 NOTE — Progress Notes (Signed)
New Patient Office Visit  Subjective:  Patient ID: [name] [name], female    DOB: Oct 15, 1985  Age: 36 y.o. MRN: [id number]  CC:  Chief Complaint  Patient presents with   New Patient (Initial Visit)    Pt looking to establish Primary care, has been having trouble with walking and sob, c/o back pain and hip pain, needs refills, would like a referral to allergist to see what her allergies are. Would like an rx for pregnancy brace.     HPI [name] [name] is a 36 y.o. female with past medical history of asthma, anxiety, GERD, and depression present for establishing care. She c/o of SOB on exertion. She has been following up with cardiology with a scheduled echo on 01/25/22. She c/o of back and hip pain and wants an order for a maternity belly band. She reports wanting a refill on her Zyrtec and Singulair. She reports being [redacted] weeks pregnant.  Past Medical History:  Diagnosis Date   Abnormal Pap smear    Anxiety    Asthma    Bipolar 1 disorder (HCC)    Depression    GERD (gastroesophageal reflux disease)    IUD (intrauterine device) in place 04/12/2013   Had IUD inserted 03/29/13 could not feel strings went to ER 9/18 and KUB saw IUD still could not see string, can't see now but seen in US   Kidney infection    Morbid obesity (HCC)    Pregnancy    UTI (urinary tract infection)     Past Surgical History:  Procedure Laterality Date   CESAREAN SECTION     HYSTEROSCOPY N/A 01/09/2021   Procedure: HYSTEROSCOPY;  Surgeon: Myna Hidalgo, DO;  Location: [facility];  Service: Gynecology;  Laterality: N/A;   IUD REMOVAL N/A 01/09/2021   Procedure: INTRAUTERINE DEVICE (IUD) REMOVAL;  Surgeon: Myna Hidalgo, DO;  Location: [facility];  Service: Gynecology;  Laterality: N/A;   TOOTH EXTRACTION      Family History  Problem Relation Age of Onset   Hypertension Father    Diabetes Father    Congestive Heart Failure Father    Diabetes Paternal Aunt    Diabetes Paternal Uncle    Hypertension Paternal  Grandmother    Diabetes Paternal Grandmother    Diabetes Paternal Grandfather     Social History   Socioeconomic History   Marital status: Single    Spouse name: Not on file   Number of children: 2   Years of education: Not on file   Highest education level: Not on file  Occupational History   Not on file  Tobacco Use   Smoking status: Former    Types: Cigarettes    Quit date: 05/21/2007    Years since quitting: 14.6   Smokeless tobacco: Never  Vaping Use   Vaping Use: Never used  Substance and Sexual Activity   Alcohol use: No   Drug use: Not Currently    Types: Marijuana    Comment: 3 times a day   Sexual activity: Yes    Partners: Male    Birth control/protection: None  Other Topics Concern   Not on file  Social History Narrative   Not on file   Social Determinants of Health   Financial Resource Strain: Low Risk  (11/14/2021)   Overall Financial Resource Strain (CARDIA)    Difficulty of Paying Living Expenses: Not very hard  Food Insecurity: No Food Insecurity (11/14/2021)   Hunger Vital Sign    Worried About Running  Out of Food in the Last Year: Never true    Ran Out of Food in the Last Year: Never true  Transportation Needs: No Transportation Needs (11/14/2021)   PRAPARE - Hydrologist (Medical): No    Lack of Transportation (Non-Medical): No  Physical Activity: Inactive (11/14/2021)   Exercise Vital Sign    Days of Exercise per Week: 0 days    Minutes of Exercise per Session: 0 min  Stress: Stress Concern Present (11/14/2021)   [place]    Feeling of Stress : Rather much  Social Connections: Socially Isolated (11/14/2021)   Social Connection and Isolation Panel [NHANES]    Frequency of Communication with Friends and Family: Never    Frequency of Social Gatherings with Friends and Family: Never    Attends Religious Services: Never    Marine scientist or  Organizations: No    Attends Archivist Meetings: Never    Marital Status: Divorced  Human resources officer Violence: Not At Risk (11/14/2021)   Humiliation, Afraid, Rape, and Kick questionnaire    Fear of Current or Ex-Partner: No    Emotionally Abused: No    Physically Abused: No    Sexually Abused: No    ROS Review of Systems  Constitutional:  Negative for chills and fever.  HENT:  Positive for congestion. Negative for sinus pressure and sinus pain.   Eyes:  Negative for photophobia, pain and redness.  Respiratory:  Positive for shortness of breath. Negative for chest tightness and wheezing.   Cardiovascular:  Negative for chest pain and palpitations.  Gastrointestinal:  Negative for blood in stool and rectal pain.  Endocrine: Negative for polydipsia, polyphagia and polyuria.  Genitourinary:  Negative for dysuria, hematuria and vaginal pain.  Musculoskeletal:  Positive for back pain. Negative for myalgias and neck stiffness.       Hips pain  Skin:  Negative for color change and pallor.  Neurological:  Negative for tremors and weakness.  Psychiatric/Behavioral:  Negative for self-injury and suicidal ideas.     Objective:   Today's Vitals: BP 118/68   Pulse (!) 107   Ht 5' 4.5" (1.638 m)   Wt (!) 365 lb 7.2 oz (165.8 kg)   LMP 08/20/2021   SpO2 98%   BMI 61.76 kg/m   Physical Exam HENT:     Head: Normocephalic.     Right Ear: External ear normal.     Left Ear: External ear normal.     Mouth/Throat:     Pharynx: No oropharyngeal exudate or posterior oropharyngeal erythema.  Eyes:     Extraocular Movements: Extraocular movements intact.     Pupils: Pupils are equal, round, and reactive to light.  Cardiovascular:     Rate and Rhythm: Normal rate and regular rhythm.     Pulses: Normal pulses.     Heart sounds: Normal heart sounds.  Pulmonary:     Effort: Pulmonary effort is normal.     Breath sounds: Normal breath sounds.  Abdominal:     General: There is  distension.  Musculoskeletal:     Right lower leg: No edema.     Left lower leg: No edema.  Lymphadenopathy:     Cervical: No cervical adenopathy.  Skin:    Findings: No bruising.  Neurological:     Mental Status: She is alert and oriented to person, place, and time.  Psychiatric:     Comments: Normal affect  Assessment & Plan:   Problem List Items Addressed This Visit       Respiratory   Asthma    Diagnosed at the age of 62 C/o of sob with exertion Rescue inhaler order      Relevant Medications   montelukast (SINGULAIR) 10 MG tablet   albuterol (VENTOLIN HFA) 108 (90 Base) MCG/ACT inhaler     Other   Obesity in pregnancy, antepartum    C/o of hip and back pain Will like an order for maternity belly band Order placed      Relevant Medications   UNABLE TO FIND   Heart palpitations    Hx of SOB with exertion and palpitations  Following up with cardiology with a scheduled echo on 01/25/22       Other Visit Diagnoses     [redacted] weeks gestation of pregnancy    -  Primary   Relevant Medications   UNABLE TO FIND   Seasonal allergic rhinitis, unspecified trigger       Relevant Medications   montelukast (SINGULAIR) 10 MG tablet   cetirizine (ZYRTEC) 10 MG tablet   SOB (shortness of breath) on exertion       Relevant Medications   albuterol (VENTOLIN HFA) 108 (90 Base) MCG/ACT inhaler       Outpatient Encounter Medications as of 01/08/2022  Medication Sig   albuterol (VENTOLIN HFA) 108 (90 Base) MCG/ACT inhaler Inhale 2 puffs into the lungs every 6 (six) hours as needed for wheezing or shortness of breath.   aspirin EC 81 MG tablet Take 1 tablet (81 mg total) by mouth daily. Swallow whole.   Blood Pressure Monitor MISC For regular home bp monitoring during pregnancy Needs large cuff   Doxylamine-Pyridoxine (DICLEGIS) 10-10 MG TBEC 2 tabs q hs, if sx persist add 1 tab q am on day 3, if sx persist add 1 tab q afternoon on day 4   fluticasone (FLONASE) 50 MCG/ACT  nasal spray Place 1 spray into both nostrils daily.   FOLIC ACID PO Take by mouth.   MELATONIN PO Take 10 mg by mouth daily at 6 (six) AM.   NIFEdipine (PROCARDIA-XL/NIFEDICAL-XL) 30 MG 24 hr tablet Take 1 tablet (30 mg total) by mouth daily.   Prenatal Vit-Fe Fumarate-FA (PRENATAL VITAMIN PO) Take by mouth.   UNABLE TO FIND Med Name: maternal belly band/ support belt [redacted] weeks pregnant   cetirizine (ZYRTEC) 10 MG tablet Take 1 tablet (10 mg total) by mouth daily.   dextromethorphan 15 MG/5ML syrup Take 5 mLs (15 mg total) by mouth 3 (three) times daily as needed for cough. (Patient not taking: Reported on 01/08/2022)   montelukast (SINGULAIR) 10 MG tablet Take 1 tablet (10 mg total) by mouth at bedtime.   [DISCONTINUED] cetirizine (ZYRTEC) 10 MG tablet Take 1 tablet (10 mg total) by mouth daily. (Patient not taking: Reported on 01/08/2022)   [DISCONTINUED] montelukast (SINGULAIR) 10 MG tablet Take 1 tablet by mouth at bedtime. (Patient not taking: Reported on 01/08/2022)   No facility-administered encounter medications on file as of 01/08/2022.    Follow-up: No follow-ups on file.   Gilmore Laroche, FNP

## 2022-01-10 NOTE — Progress Notes (Deleted)
Cardio-Obstetrics Clinic  Follow-up Evaluation  Date:  01/10/2022   ID:  [name] [name], [name] [name] May 25, 1986, MRN [id number]  PCP:  Gilmore Laroche, FNP   [facility] Providers Cardiologist:  Prentice Docker, MD (Inactive)  Electrophysiologist:  None       Referring MD: [facility],*   Chief Complaint: palpitations  History of Present Illness:    [name] [name] is a 36 y.o. female [[id number]] who presents to clinic for follow-up for palpitations.  Patient has a history of bipolar disorder and morbid obesity.   Saw Dr. Purvis Sheffield in 2020 for atypical chest pain. TTE showed LVEF 50-55%, normal RV, normal PASP, no significant valve disease. Symptoms were thought to be related to anxiety.  Was last seen in clinic on 12/14/21 where she was having significant palpitations and dyspnea on exertion. BP was also significantly elevated on forearm cuff running up to 200s. We started her on nifedipine 30mg  daily. Recommended her for echo and cardiac monitor which have not been completed.    Dad: CHF, HTN, DMII, CAD s/p PCI, Obesity   Prior CV Studies Reviewed: The following studies were reviewed today: TTE 12-13-18: IMPRESSIONS     1. The left ventricle has low normal systolic function, with an ejection  fraction of 50-55%. Image quality not adequate to assess focal wall  motion. The cavity size was normal. Left ventricular diastolic parameters  were normal.   2. The right ventricle has normal systolic function. The cavity was  normal. There is no increase in right ventricular wall thickness. Right  ventricular systolic pressure normal with an estimated pressure of 23.2  mmHg.   3. The mitral valve is grossly normal.   4. The tricuspid valve is grossly normal.   5. The aortic valve is tricuspid.   6. The aortic root is normal in size and structure.   Past Medical History:  Diagnosis Date   Abnormal Pap smear    Anxiety    Asthma    Bipolar 1 disorder (HCC)     Depression    GERD (gastroesophageal reflux disease)    IUD (intrauterine device) in place 04/12/2013   Had IUD inserted 03/29/13 could not feel strings went to ER 9/18 and KUB saw IUD still could not see string, can't see now but seen in 10/18   Kidney infection    Morbid obesity (HCC)    Pregnancy    UTI (urinary tract infection)     Past Surgical History:  Procedure Laterality Date   CESAREAN SECTION     HYSTEROSCOPY N/A 01/09/2021   Procedure: HYSTEROSCOPY;  Surgeon: 01/11/2021, DO;  Location: [facility];  Service: Gynecology;  Laterality: N/A;   IUD REMOVAL N/A 01/09/2021   Procedure: INTRAUTERINE DEVICE (IUD) REMOVAL;  Surgeon: 01/11/2021, DO;  Location: [facility];  Service: Gynecology;  Laterality: N/A;   TOOTH EXTRACTION        OB History     Gravida  5   Para  2   Term  1   Preterm  1   AB  2   Living  2      SAB  2   IAB  0   Ectopic  0   Multiple  0   Live Births  2               Current Medications: No outpatient medications have been marked as taking for the 01/11/22 encounter (Appointment) with 01/13/22, MD.     Allergies:  Shellfish allergy   Social History   Socioeconomic History   Marital status: Single    Spouse name: Not on file   Number of children: 2   Years of education: Not on file   Highest education level: Not on file  Occupational History   Not on file  Tobacco Use   Smoking status: Former    Types: Cigarettes    Quit date: 05/21/2007    Years since quitting: 14.6   Smokeless tobacco: Never  Vaping Use   Vaping Use: Never used  Substance and Sexual Activity   Alcohol use: No   Drug use: Not Currently    Types: Marijuana    Comment: 3 times a day   Sexual activity: Yes    Partners: Male    Birth control/protection: None  Other Topics Concern   Not on file  Social History Narrative   Not on file   Social Determinants of Health   Financial Resource Strain: Low Risk  (11/14/2021)   Overall Financial  Resource Strain (CARDIA)    Difficulty of Paying Living Expenses: Not very hard  Food Insecurity: No Food Insecurity (11/14/2021)   Hunger Vital Sign    Worried About Running Out of Food in the Last Year: Never true    Ran Out of Food in the Last Year: Never true  Transportation Needs: No Transportation Needs (11/14/2021)   PRAPARE - Administrator, Civil Service (Medical): No    Lack of Transportation (Non-Medical): No  Physical Activity: Inactive (11/14/2021)   Exercise Vital Sign    Days of Exercise per Week: 0 days    Minutes of Exercise per Session: 0 min  Stress: Stress Concern Present (11/14/2021)   [employer] of Occupational Health - Occupational Stress Questionnaire    Feeling of Stress : Rather much  Social Connections: Socially Isolated (11/14/2021)   Social Connection and Isolation Panel [NHANES]    Frequency of Communication with Friends and Family: Never    Frequency of Social Gatherings with Friends and Family: Never    Attends Religious Services: Never    Database administrator or Organizations: No    Attends Engineer, structural: Never    Marital Status: Divorced      Family History  Problem Relation Age of Onset   Hypertension Father    Diabetes Father    Congestive Heart Failure Father    Diabetes Paternal Aunt    Diabetes Paternal Uncle    Hypertension Paternal Grandmother    Diabetes Paternal Grandmother    Diabetes Paternal Grandfather       ROS:   Please see the history of present illness.     All other systems reviewed and are negative.   Labs/EKG Reviewed:    EKG:   EKG is  ordered today.  The ekg ordered today demonstrates sinus tachycardia with HR 104  Recent Labs: 12/27/2021: ALT 60; BUN 11; Creatinine, Ser 0.70; Hemoglobin 11.4; Platelets 228; Potassium 3.4; Sodium 138   Recent Lipid Panel No results found for: "CHOL", "TRIG", "HDL", "CHOLHDL", "LDLCALC", "LDLDIRECT"  Physical Exam:    VS:  LMP 08/20/2021      Wt Readings from Last 3 Encounters:  01/08/22 (!) 365 lb 7.2 oz (165.8 kg)  01/02/22 (!) 362 lb 3.2 oz (164.3 kg)  12/27/21 (!) 367 lb (166.5 kg)     GEN:  Well nourished, well developed in no acute distress HEENT: Normal NECK: No JVD; No carotid bruits CARDIAC: RRR, no  murmurs, rubs, gallops RESPIRATORY:  Clear to auscultation without rales, wheezing or rhonchi  ABDOMEN: Obese, soft MUSCULOSKELETAL:  No edema; No deformity  SKIN: Warm and dry NEUROLOGIC:  Alert and oriented x 3 PSYCHIATRIC:  Normal affect    ASSESSMENT & PLAN:    #Palpitations: Patient presented with worsening palpitations that have been ongoing since the start of her pregnancy. Has associated lightheadedness but no syncope. Also with significant dyspnea on exertion which has limited her activity. Zio and TTE ordered but not yet completed -Follow-up zio monitor -Follow-up TTE -Increase hydration -Small, frequent meals  #Dyspnea on Exertion: Has been ongoing since the start of her pregnancy. Again, this may be related to the underlying hormonal changes in pregnancy, however, given family history and degree of symptoms, recommended TTE. Currently pending  #Gestational HTN: BP persistently in 140s on multiple MD visits and running as high as 200s at home. Started on nifedipine. Currently, *** -Start nifedipine 30mg  daily -Follow-up with Pharm D HTN clinic -Will bring cuff to visit to correlate readings  #High Risk Pregnancy: #Morbid Obesity: Has discussed with OBGYN. Currently on ASA 81mg  daily. Will discuss CV risk reduction going forward.   There are no Patient Instructions on file for this visit.   Dispo:  No follow-ups on file.   Medication Adjustments/Labs and Tests Ordered: Current medicines are reviewed at length with the patient today.  Concerns regarding medicines are outlined above.  Tests Ordered: No orders of the defined types were placed in this encounter.  Medication Changes: No  orders of the defined types were placed in this encounter.

## 2022-01-11 ENCOUNTER — Telehealth (INDEPENDENT_AMBULATORY_CARE_PROVIDER_SITE_OTHER): Payer: Medicaid Other | Admitting: Cardiology

## 2022-01-11 ENCOUNTER — Telehealth: Payer: Self-pay | Admitting: *Deleted

## 2022-01-11 ENCOUNTER — Ambulatory Visit: Payer: Self-pay | Admitting: Cardiology

## 2022-01-11 ENCOUNTER — Encounter: Payer: Self-pay | Admitting: *Deleted

## 2022-01-11 ENCOUNTER — Encounter: Payer: Self-pay | Admitting: Cardiology

## 2022-01-11 VITALS — BP 128/77 | HR 99 | Ht 64.5 in | Wt 365.0 lb

## 2022-01-11 DIAGNOSIS — R0602 Shortness of breath: Secondary | ICD-10-CM

## 2022-01-11 DIAGNOSIS — R002 Palpitations: Secondary | ICD-10-CM | POA: Diagnosis not present

## 2022-01-11 DIAGNOSIS — O132 Gestational [pregnancy-induced] hypertension without significant proteinuria, second trimester: Secondary | ICD-10-CM

## 2022-01-11 NOTE — Progress Notes (Addendum)
Virtual Visit via Telephone Note   Because of [name] [name]'s co-morbid illnesses, she is at least at moderate risk for complications without adequate follow up.  This format is felt to be most appropriate for this patient at this time.  The patient did not have access to video technology/had technical difficulties with video requiring transitioning to audio format only (telephone).  All issues noted in this document were discussed and addressed.  No physical exam could be performed with this format.  Please refer to the patient's chart for her consent to telehealth for [facility].    Date:  01/11/2022   ID:  [name] [name], DOB Jun 09, 1986, MRN [id number] The patient was identified using 2 identifiers.  Patient Location: Home Provider Location: Office/Clinic   PCP:  Gilmore Laroche, FNP   [facility] Providers Cardiologist:  Prentice Docker, MD (Inactive)    Evaluation Performed:  Follow-Up Visit  Chief Complaint:  Palpitations and SOB   History of Present Illness:    [name] [name] is a 36 y.o. female [G5P1122] who presents to clinic for follow-up of palpitations and SOB.  Patient has a history of bipolar disorder and morbid obesity.  Patient had previously seen Dr. Purvis Sheffield in 2020 for atypical chest pain. TTE showed LVEF 50-55%, normal RV, normal PASP, no significant valve disease. Symptoms were thought to be related to anxiety.  Was last seen in clinic on 12/14/21 where she was having palpitations and significant dyspnea on exertion. We recommended cardiac monitor which is pending and TTE which is also pending.  Today, the patient states she continues to have significant dyspnea with exertion. This is overall stable since prior visit. She was told she may need to be on inhaler therapy as it is possible her asthma is contributing. Overall, her palpitations are improved. No chest pain, orthopnea, LE edema or PND. Blood pressure is much better controlled in the  120s on nifedipine. HR mainly in 90s.   Has just sent back her cardiac monitor. TTE is scheduled.  Dad: CHF, HTN, DMII, CAD s/p PCI, Obesity   Prior CV Studies Reviewed: The following studies were reviewed today: TTE 12-11-2018: IMPRESSIONS     1. The left ventricle has low normal systolic function, with an ejection  fraction of 50-55%. Image quality not adequate to assess focal wall  motion. The cavity size was normal. Left ventricular diastolic parameters  were normal.   2. The right ventricle has normal systolic function. The cavity was  normal. There is no increase in right ventricular wall thickness. Right  ventricular systolic pressure normal with an estimated pressure of 23.2  mmHg.   3. The mitral valve is grossly normal.   4. The tricuspid valve is grossly normal.   5. The aortic valve is tricuspid.   6. The aortic root is normal in size and structure.   Past Medical History:  Diagnosis Date   Abnormal Pap smear    Anxiety    Asthma    Bipolar 1 disorder (HCC)    Depression    GERD (gastroesophageal reflux disease)    IUD (intrauterine device) in place 04/12/2013   Had IUD inserted 03/29/13 could not feel strings went to ER 9/18 and KUB saw IUD still could not see string, can't see now but seen in US   Kidney infection    Morbid obesity (HCC)    Pregnancy    UTI (urinary tract infection)     Past Surgical History:  Procedure Laterality Date  CESAREAN SECTION     HYSTEROSCOPY N/A 01/09/2021   Procedure: HYSTEROSCOPY;  Surgeon: Myna Hidalgo, DO;  Location: [facility];  Service: Gynecology;  Laterality: N/A;   IUD REMOVAL N/A 01/09/2021   Procedure: INTRAUTERINE DEVICE (IUD) REMOVAL;  Surgeon: Myna Hidalgo, DO;  Location: [facility];  Service: Gynecology;  Laterality: N/A;   TOOTH EXTRACTION        OB History     Gravida  5   Para  2   Term  1   Preterm  1   AB  2   Living  2      SAB  2   IAB  0   Ectopic  0   Multiple  0   Live Births  2                Current Medications: Current Meds  Medication Sig   albuterol (VENTOLIN HFA) 108 (90 Base) MCG/ACT inhaler Inhale 2 puffs into the lungs every 6 (six) hours as needed for wheezing or shortness of breath.   aspirin EC 81 MG tablet Take 1 tablet (81 mg total) by mouth daily. Swallow whole.   Blood Pressure Monitor MISC For regular home bp monitoring during pregnancy Needs large cuff   cetirizine (ZYRTEC) 10 MG tablet Take 1 tablet (10 mg total) by mouth daily.   dextromethorphan 15 MG/5ML syrup Take 5 mLs (15 mg total) by mouth 3 (three) times daily as needed for cough.   Doxylamine-Pyridoxine (DICLEGIS) 10-10 MG TBEC 2 tabs q hs, if sx persist add 1 tab q am on day 3, if sx persist add 1 tab q afternoon on day 4   fluticasone (FLONASE) 50 MCG/ACT nasal spray Place 1 spray into both nostrils daily.   FOLIC ACID PO Take by mouth.   MELATONIN PO Take 10 mg by mouth daily at 6 (six) AM.   montelukast (SINGULAIR) 10 MG tablet Take 1 tablet (10 mg total) by mouth at bedtime.   NIFEdipine (PROCARDIA-XL/NIFEDICAL-XL) 30 MG 24 hr tablet Take 1 tablet (30 mg total) by mouth daily.   Prenatal Vit-Fe Fumarate-FA (PRENATAL VITAMIN PO) Take by mouth.   UNABLE TO FIND Med Name: maternal belly band/ support belt [redacted] weeks pregnant     Allergies:   Shellfish allergy   Social History   Socioeconomic History   Marital status: Single    Spouse name: Not on file   Number of children: 2   Years of education: Not on file   Highest education level: Not on file  Occupational History   Not on file  Tobacco Use   Smoking status: Former    Types: Cigarettes    Quit date: 05/21/2007    Years since quitting: 14.6   Smokeless tobacco: Never  Vaping Use   Vaping Use: Never used  Substance and Sexual Activity   Alcohol use: No   Drug use: Not Currently    Types: Marijuana    Comment: 3 times a day   Sexual activity: Yes    Partners: Male    Birth control/protection: None  Other Topics  Concern   Not on file  Social History Narrative   Not on file   Social Determinants of Health   Financial Resource Strain: Low Risk  (11/14/2021)   Overall Financial Resource Strain (CARDIA)    Difficulty of Paying Living Expenses: Not very hard  Food Insecurity: No Food Insecurity (11/14/2021)   Hunger Vital Sign    Worried About Running Out of  Food in the Last Year: Never true    Ran Out of Food in the Last Year: Never true  Transportation Needs: No Transportation Needs (11/14/2021)   PRAPARE - Administrator, Civil Service (Medical): No    Lack of Transportation (Non-Medical): No  Physical Activity: Inactive (11/14/2021)   Exercise Vital Sign    Days of Exercise per Week: 0 days    Minutes of Exercise per Session: 0 min  Stress: Stress Concern Present (11/14/2021)   [employer] of Occupational Health - Occupational Stress Questionnaire    Feeling of Stress : Rather much  Social Connections: Socially Isolated (11/14/2021)   Social Connection and Isolation Panel [NHANES]    Frequency of Communication with Friends and Family: Never    Frequency of Social Gatherings with Friends and Family: Never    Attends Religious Services: Never    Database administrator or Organizations: No    Attends Engineer, structural: Never    Marital Status: Divorced      Family History  Problem Relation Age of Onset   Hypertension Father    Diabetes Father    Congestive Heart Failure Father    Diabetes Paternal Aunt    Diabetes Paternal Uncle    Hypertension Paternal Grandmother    Diabetes Paternal Grandmother    Diabetes Paternal Grandfather       ROS:   Please see the history of present illness.     All other systems reviewed and are negative.   Labs/EKG Reviewed:    EKG:   EKG is  ordered today.  The ekg ordered today demonstrates sinus tachycardia with HR 104  Recent Labs: 12/27/2021: ALT 60; BUN 11; Creatinine, Ser 0.70; Hemoglobin 11.4; Platelets 228;  Potassium 3.4; Sodium 138   Recent Lipid Panel No results found for: "CHOL", "TRIG", "HDL", "CHOLHDL", "LDLCALC", "LDLDIRECT"  Physical Exam:    VS:  BP 128/77   Pulse 99   Ht 5' 4.5" (1.638 m)   Wt (!) 365 lb (165.6 kg)   LMP 08/20/2021   BMI 61.68 kg/m     Wt Readings from Last 3 Encounters:  01/11/22 (!) 365 lb (165.6 kg)  01/08/22 (!) 365 lb 7.2 oz (165.8 kg)  01/02/22 (!) 362 lb 3.2 oz (164.3 kg)     No exam today due to virtual visit.   ASSESSMENT & PLAN:    #Palpitations: Overall improved. No chest pain, lightheadedness or syncope. Zio completed and TTE scheduled. Will follow-up results.  -Follow-up zio and TTE -Increase hydration -Small, frequent meals  #Dyspnea on Exertion: Has been ongoing since the start of her pregnancy. Has been persistent. May be multifactorial in the setting of hormonal shifts in pregnancy, obesity and possible asthma. TTE and zio pending as above -Check TTE and zio as above -If TTE and cardiac monitor reassuring, agree with further work-up of possible asthma symptoms  #Gestational HTN: Much better controlled and at goal of <140/90.  -Continue nifedipine 30mg  daily  #High Risk Pregnancy: #Morbid Obesity: Has discussed with OBGYN. Currently on ASA 81mg  daily. Will discuss CV risk reduction going forward.   Spoke with the patient for 20 minutes on the phone. All questions answered. Will see back in 12 weeks in person.  Patient Instructions  Medication Instructions:   Your physician recommends that you continue on your current medications as directed. Please refer to the Current Medication list given to you today.  *If you need a refill on your cardiac medications before your  next appointment, please call your pharmacy*   Follow-Up:  IN 12 WEEKS WITH DR. Shari Prows AT [facility]--YOU WILL GET A CALL BACK SOON FROM DR. Jlen Wintle'S SCHEDULER, TO ARRANGE YOUR 12 WEEK FOLLOW-UP APPOINTMENT   Important Information About  Sugar         Dispo:  No follow-ups on file.   Medication Adjustments/Labs and Tests Ordered: Current medicines are reviewed at length with the patient today.  Concerns regarding medicines are outlined above.  Tests Ordered: No orders of the defined types were placed in this encounter.  Medication Changes: No orders of the defined types were placed in this encounter.

## 2022-01-17 ENCOUNTER — Other Ambulatory Visit: Payer: Medicaid Other

## 2022-01-17 ENCOUNTER — Encounter: Payer: Self-pay | Admitting: Obstetrics & Gynecology

## 2022-01-17 ENCOUNTER — Ambulatory Visit (INDEPENDENT_AMBULATORY_CARE_PROVIDER_SITE_OTHER): Payer: Medicaid Other

## 2022-01-17 ENCOUNTER — Ambulatory Visit (INDEPENDENT_AMBULATORY_CARE_PROVIDER_SITE_OTHER): Payer: Medicaid Other | Admitting: Obstetrics & Gynecology

## 2022-01-17 VITALS — BP 132/88 | HR 98 | Wt 363.6 lb

## 2022-01-17 DIAGNOSIS — Z3A21 21 weeks gestation of pregnancy: Secondary | ICD-10-CM | POA: Diagnosis not present

## 2022-01-17 DIAGNOSIS — Z6841 Body Mass Index (BMI) 40.0 and over, adult: Secondary | ICD-10-CM

## 2022-01-17 DIAGNOSIS — Z363 Encounter for antenatal screening for malformations: Secondary | ICD-10-CM | POA: Diagnosis not present

## 2022-01-17 DIAGNOSIS — Z3480 Encounter for supervision of other normal pregnancy, unspecified trimester: Secondary | ICD-10-CM

## 2022-01-17 DIAGNOSIS — Z6791 Unspecified blood type, Rh negative: Secondary | ICD-10-CM

## 2022-01-17 DIAGNOSIS — Z8759 Personal history of other complications of pregnancy, childbirth and the puerperium: Secondary | ICD-10-CM

## 2022-01-17 DIAGNOSIS — O0992 Supervision of high risk pregnancy, unspecified, second trimester: Secondary | ICD-10-CM

## 2022-01-17 DIAGNOSIS — O132 Gestational [pregnancy-induced] hypertension without significant proteinuria, second trimester: Secondary | ICD-10-CM

## 2022-01-17 DIAGNOSIS — O99210 Obesity complicating pregnancy, unspecified trimester: Secondary | ICD-10-CM

## 2022-01-17 NOTE — Progress Notes (Signed)
[place] 21+3 wks,cephalic,anterior placenta gr 0,normal ovaries,cx 4.2 cm,FHR 138 bpm,SVP of fluid 7.3 cm,EFW 452 g 63%,anatomy complete,no obvious abnormalities

## 2022-01-17 NOTE — Progress Notes (Signed)
HIGH-RISK PREGNANCY VISIT Patient name: [name] [name] MRN [id number]  Date of birth: 01/18/1986 Chief Complaint:   Routine Prenatal Visit (Anatomy scan/)  History of Present Illness:   [name] [name] is a 36 y.o. [id number] female at [redacted]w[redacted]d with an Estimated Date of Delivery: 05/27/22 being seen today for ongoing management of a high-risk pregnancy complicated by: -Gestational HTN- diagnosed by cardiology, started on Procardia 5/26 -initially seen by cardiology due to heart palpitations- denies issue currently  -TOLAC -Obesity -O neg  Today she reports no complaints.   Contractions: Not present. Vag. Bleeding: None.  Movement: Present. denies leaking of fluid.      01/08/2022    8:17 AM 11/14/2021   11:05 AM 12/26/2020   10:38 AM 12/31/2018    9:13 AM 12/28/2018    9:14 AM  Depression screen PHQ 2/9  Decreased Interest 0 1 1 2  0  Down, Depressed, Hopeless 1 2 1 2  0  PHQ - 2 Score 1 3 2 4  0  Altered sleeping  3 3 3    Tired, decreased energy  3 3 3    Change in appetite  0 0 3   Feeling bad or failure about yourself   0 0 1   Trouble concentrating  0 0 1   Moving slowly or fidgety/restless  0 0 0   Suicidal thoughts  0 0 0   PHQ-9 Score  9 8 15    Difficult doing work/chores    Very difficult      Current Outpatient Medications  Medication Instructions   albuterol (VENTOLIN HFA) 108 (90 Base) MCG/ACT inhaler 2 puffs, Inhalation, Every 6 hours PRN   aspirin EC 81 mg, Oral, Daily, Swallow whole.   Blood Pressure Monitor MISC For regular home bp monitoring during pregnancy<BR>Needs large cuff   cetirizine (ZYRTEC) 10 mg, Oral, Daily   dextromethorphan 15 mg, Oral, 3 times daily PRN   Doxylamine-Pyridoxine (DICLEGIS) 10-10 MG TBEC 2 tabs q hs, if sx persist add 1 tab q am on day 3, if sx persist add 1 tab q afternoon on day 4   fluticasone (FLONASE) 50 MCG/ACT nasal spray 1 spray, Each Nare, Daily   FOLIC ACID PO Oral   MELATONIN PO 10 mg, Oral, Daily   montelukast (SINGULAIR)  10 mg, Oral, Daily at bedtime   NIFEdipine (PROCARDIA-XL/NIFEDICAL-XL) 30 mg, Oral, Daily   Prenatal Vit-Fe Fumarate-FA (PRENATAL VITAMIN PO) Oral   UNABLE TO FIND Med Name: maternal belly band/ support belt<BR>[redacted] weeks pregnant     Review of Systems:   Pertinent items are noted in HPI Denies abnormal vaginal discharge w/ itching/odor/irritation, headaches, visual changes, shortness of breath, chest pain, abdominal pain, severe nausea/vomiting, or problems with urination or bowel movements unless otherwise stated above. Pertinent History Reviewed:  Reviewed past medical,surgical, social, obstetrical and family history.  Reviewed problem list, medications and allergies. Physical Assessment:   Vitals:   01/17/22 1513  Weight: 263 lb 9.6 oz (119.6 kg)  Body mass index is 44.55 kg/m.           Physical Examination:   General appearance: alert, well appearing, and in no distress  Mental status: normal mood, behavior, speech, dress, motor activity, and thought processes  Skin: warm & dry   Extremities: Edema: None    Cardiovascular: normal heart rate noted  Respiratory: normal respiratory effort, no distress  Abdomen: gravid, soft, non-tender  Pelvic: Cervical exam deferred         Fetal Status:  Movement: Present    Fetal Surveillance Testing today: cephalic,anterior placenta gr 0,normal ovaries,cx 4.2 cm,FHR 138 bpm,SVP of fluid 7.3 cm,EFW 452 g 63%,anatomy complete,no obvious abnormalities    Chaperone: N/A     Assessment & Plan:  High-risk pregnancy: [id number] at [redacted]w[redacted]d with an Estimated Date of Delivery: 05/27/22   1) GestHTN-  -continue on procardia XL 30mg  daily -growth scan q 4wks -BPP weekly in third trimester -reviewed early IOL  2) TOLAC -briefly discussed risk/benefits, will plan to sign consent at future appointments -hoping for VBAC for shorter recovery- works as CNA  3) Obesity 2lb weight loss since last visit  Meds: No orders of the defined types were  placed in this encounter.   Labs/procedures today: anatomy scan  Treatment Plan:  as outlined above  Reviewed: Preterm labor symptoms and general obstetric precautions including but not limited to vaginal bleeding, contractions, leaking of fluid and fetal movement were reviewed in detail with the patient.  All questions were answered.  Follow-up: No follow-ups on file.   Future Appointments  Date Time Provider Department Center  01/17/2022  3:30 PM 01/19/2022, DO [facility]  01/25/2022 12:50 PM [facility] 4 [id number]  04/05/2022  2:00 PM 04/07/2022, MD [facility] None  04/10/2022  8:00 AM 04/12/2022, FNP [facility]    No orders of the defined types were placed in this encounter.   Gilmore Laroche, DO Attending Obstetrician & Gynecologist, [facility] for [facility]

## 2022-01-25 ENCOUNTER — Ambulatory Visit (HOSPITAL_COMMUNITY): Payer: Medicaid Other | Attending: Cardiology

## 2022-01-28 ENCOUNTER — Telehealth (HOSPITAL_COMMUNITY): Payer: Self-pay | Admitting: Cardiology

## 2022-01-28 NOTE — Telephone Encounter (Signed)
Patient cancelled echocardiogram and did not reschedule see below:  01/25/22 same day cancellation on LVM. evd 01/07/22 NO SHOWED- MY CHART  LETTER SENT AND COPIED MD [facility]

## 2022-01-31 ENCOUNTER — Encounter: Payer: Self-pay | Admitting: Family Medicine

## 2022-02-01 ENCOUNTER — Encounter: Payer: Self-pay | Admitting: Nurse Practitioner

## 2022-02-01 ENCOUNTER — Ambulatory Visit (INDEPENDENT_AMBULATORY_CARE_PROVIDER_SITE_OTHER): Payer: Medicaid Other | Admitting: Nurse Practitioner

## 2022-02-01 DIAGNOSIS — F419 Anxiety disorder, unspecified: Secondary | ICD-10-CM | POA: Insufficient documentation

## 2022-02-01 DIAGNOSIS — F32A Depression, unspecified: Secondary | ICD-10-CM | POA: Diagnosis not present

## 2022-02-01 HISTORY — DX: Anxiety disorder, unspecified: F41.9

## 2022-02-01 NOTE — Progress Notes (Signed)
Virtual Visit via Telephone Note  I connected with Theophilus Kinds @ on 02/01/22 at 1213pm by telephone and verified that I am speaking with the correct person using two identifiers.  I spent 8 minutes on this telephone encounter  Location: Patient: home Provider: office   I discussed the limitations, risks, security and privacy concerns of performing an evaluation and management service by telephone and the availability of in person appointments. I also discussed with the patient that there may be a patient responsible charge related to this service. The patient expressed understanding and agreed to proceed.   History of Present Illness: Pt c/o depression and anxiety, has been having been this problem her whole life but her condition has been getting worse since the past 2 months . She was previously on medications which she stopped taking due to her pregnancy. She is currently [redacted] weeks pregnant.  She is established with OB/GYN.  Denies SI, HI.   Observations/Objective:   Assessment and Plan: Anxiety and depression PHQ 9 score 18 GAD 7 score 17. Patient is currently pregnant, she is advised to follow-up with OB/GYN for appropriate medication for her condition. Patient  Referred to CCM  for counseling   Follow Up Instructions:    I discussed the assessment and treatment plan with the patient. The patient was provided an opportunity to ask questions and all were answered. The patient agreed with the plan and demonstrated an understanding of the instructions.   The patient was advised to call back or seek an in-person evaluation if the symptoms worsen or if the condition fails to improve as anticipated.

## 2022-02-01 NOTE — Assessment & Plan Note (Addendum)
PHQ 9 score 18 GAD 7 score 17. Patient is currently pregnant, she is advised to follow-up with OB/GYN for appropriate medication for her condition. Patient  Referred to CCM  for counseling

## 2022-02-12 ENCOUNTER — Ambulatory Visit (HOSPITAL_COMMUNITY): Payer: Medicaid Other

## 2022-02-13 ENCOUNTER — Other Ambulatory Visit: Payer: Self-pay | Admitting: Obstetrics & Gynecology

## 2022-02-13 DIAGNOSIS — O132 Gestational [pregnancy-induced] hypertension without significant proteinuria, second trimester: Secondary | ICD-10-CM

## 2022-02-14 ENCOUNTER — Ambulatory Visit (INDEPENDENT_AMBULATORY_CARE_PROVIDER_SITE_OTHER): Payer: Medicaid Other | Admitting: Obstetrics & Gynecology

## 2022-02-14 ENCOUNTER — Encounter: Payer: Self-pay | Admitting: Obstetrics & Gynecology

## 2022-02-14 ENCOUNTER — Ambulatory Visit (INDEPENDENT_AMBULATORY_CARE_PROVIDER_SITE_OTHER): Payer: Medicaid Other

## 2022-02-14 VITALS — BP 120/68 | HR 96 | Wt 356.0 lb

## 2022-02-14 DIAGNOSIS — Z3A25 25 weeks gestation of pregnancy: Secondary | ICD-10-CM

## 2022-02-14 DIAGNOSIS — O0992 Supervision of high risk pregnancy, unspecified, second trimester: Secondary | ICD-10-CM

## 2022-02-14 DIAGNOSIS — Z6791 Unspecified blood type, Rh negative: Secondary | ICD-10-CM

## 2022-02-14 DIAGNOSIS — Z3480 Encounter for supervision of other normal pregnancy, unspecified trimester: Secondary | ICD-10-CM

## 2022-02-14 DIAGNOSIS — O99210 Obesity complicating pregnancy, unspecified trimester: Secondary | ICD-10-CM

## 2022-02-14 DIAGNOSIS — Z8759 Personal history of other complications of pregnancy, childbirth and the puerperium: Secondary | ICD-10-CM

## 2022-02-14 DIAGNOSIS — Z113 Encounter for screening for infections with a predominantly sexual mode of transmission: Secondary | ICD-10-CM

## 2022-02-14 DIAGNOSIS — O132 Gestational [pregnancy-induced] hypertension without significant proteinuria, second trimester: Secondary | ICD-10-CM

## 2022-02-14 DIAGNOSIS — F419 Anxiety disorder, unspecified: Secondary | ICD-10-CM

## 2022-02-14 DIAGNOSIS — F310 Bipolar disorder, current episode hypomanic: Secondary | ICD-10-CM

## 2022-02-14 DIAGNOSIS — O139 Gestational [pregnancy-induced] hypertension without significant proteinuria, unspecified trimester: Secondary | ICD-10-CM | POA: Insufficient documentation

## 2022-02-14 LAB — POCT URINALYSIS DIPSTICK OB
Blood, UA: NEGATIVE
Glucose, UA: NEGATIVE
Ketones, UA: NEGATIVE
Leukocytes, UA: NEGATIVE
Nitrite, UA: POSITIVE

## 2022-02-14 MED ORDER — HYDROXYZINE PAMOATE 25 MG PO CAPS: 25.0000 mg | ORAL_CAPSULE | Freq: Every evening | ORAL | 4 refills | Status: AC | PRN

## 2022-02-14 NOTE — Progress Notes (Signed)
[place] 25+3 wks,cephalic,anterior placenta gr 0,FHR 150 BPM,cx 3.1 cm,EFW 913 g 74%,SVP of fluid 6.1 cm

## 2022-02-14 NOTE — Progress Notes (Signed)
HIGH-RISK PREGNANCY VISIT Patient name: [name] [name] MRN [id number]  Date of birth: 11/06/1985 Chief Complaint:   Routine Prenatal Visit (ultrasound)  History of Present Illness:   [name] [name] is a 36 y.o. [id number] female at [redacted]w[redacted]d with an Estimated Date of Delivery: 05/27/22 being seen today for ongoing management of a high-risk pregnancy complicated by:  -Gest HTN -Anxiety/Bipolar/Depression -Morbid obesity -TOLAC -Oneg.    Today she reports  having issues with her mental health.  Previously on Abilify and Vistaril.  Stopped when she was pregnant.  She is have "baby daddy" issues and feels like she is not doing well.  Per pt she tried to call where she went previously and they said they could not see her .   Contractions: Not present. Vag. Bleeding: None.  Movement: Present. denies leaking of fluid.      02/01/2022   11:33 AM 01/08/2022    8:17 AM 11/14/2021   11:05 AM 12/26/2020   10:38 AM 12/31/2018    9:13 AM  Depression screen PHQ 2/9  Decreased Interest 3 0 1 1 2   Down, Depressed, Hopeless 3 1 2 1 2   PHQ - 2 Score 6 1 3 2 4   Altered sleeping 2  3 3 3   Tired, decreased energy 2  3 3 3   Change in appetite 2  0 0 3  Feeling bad or failure about yourself  3  0 0 1  Trouble concentrating 3  0 0 1  Moving slowly or fidgety/restless 0  0 0 0  Suicidal thoughts 0  0 0 0  PHQ-9 Score 18  9 8 15   Difficult doing work/chores Extremely dIfficult    Very difficult     Current Outpatient Medications  Medication Instructions   albuterol (VENTOLIN HFA) 108 (90 Base) MCG/ACT inhaler 2 puffs, Inhalation, Every 6 hours PRN   aspirin EC 81 mg, Oral, Daily, Swallow whole.   Blood Pressure Monitor MISC For regular home bp monitoring during pregnancy<BR>Needs large cuff   cetirizine (ZYRTEC) 10 mg, Oral, Daily   dextromethorphan 15 mg, Oral, 3 times daily PRN   Doxylamine-Pyridoxine (DICLEGIS) 10-10 MG TBEC 2 tabs q hs, if sx persist add 1 tab q am on day 3, if sx persist add 1 tab q  afternoon on day 4   fluticasone (FLONASE) 50 MCG/ACT nasal spray 1 spray, Each Nare, Daily   FOLIC ACID PO Oral   MELATONIN PO 10 mg, Oral, Daily   montelukast (SINGULAIR) 10 mg, Oral, Daily at bedtime   NIFEdipine (PROCARDIA-XL/NIFEDICAL-XL) 30 mg, Oral, Daily   Prenatal Vit-Fe Fumarate-FA (PRENATAL VITAMIN PO) Oral   UNABLE TO FIND Med Name: maternal belly band/ support belt<BR>[redacted] weeks pregnant     Review of Systems:   Pertinent items are noted in HPI Denies abnormal vaginal discharge w/ itching/odor/irritation, headaches, visual changes, shortness of breath, chest pain, abdominal pain, severe nausea/vomiting, or problems with urination or bowel movements unless otherwise stated above. Pertinent History Reviewed:  Reviewed past medical,surgical, social, obstetrical and family history.  Reviewed problem list, medications and allergies. Physical Assessment:   Vitals:   02/14/22 1637  BP: 120/68  Pulse: 96  Weight: (!) 356 lb (161.5 kg)  Body mass index is 60.16 kg/m.           Physical Examination:   General appearance: alert, well appearing, and in no distress  Mental status: mood appropriate  Skin: warm & dry   Extremities: Edema: None    Cardiovascular: normal heart  rate noted  Respiratory: normal respiratory effort, no distress  Abdomen: gravid, soft, non-tender  Pelvic: Cervical exam deferred         Fetal Status:     Movement: Present    Fetal Surveillance Testing today: [place] 25+3 wks,cephalic,anterior placenta gr 0,FHR 150 BPM,cx 3.1 cm,EFW 913 g 74%,SVP of fluid 6.1 cm   Chaperone: N/A    No results found for this or any previous visit (from the past 24 hour(s)).   Assessment & Plan:  High-risk pregnancy: [id number] at [redacted]w[redacted]d with an Estimated Date of Delivery: 05/27/22   1) Gest HTN -continue current meds -continue growth q 4wks -start antepartum testing @ 32wks  2) Contraceptive management -today we discussed contraception as she does not desire more  children. She wants her tube tied, but she does not want to have surgery because she is worried about recovery -While we did not review risk of TOLAC, we did discuss the possibility of a C-section.  Should she have a C-section, she does want tubal ligation. Inform consent for tubal ligation completed today  3) Anxiety/Bipolar -referral to both psych and our behavior health services -vistaril sent in for nighttime   Meds: No orders of the defined types were placed in this encounter.   Labs/procedures today: growth scan  Treatment Plan:  lab work next visit, continue plan as outlined above  Reviewed: Preterm labor symptoms and general obstetric precautions including but not limited to vaginal bleeding, contractions, leaking of fluid and fetal movement were reviewed in detail with the patient.  All questions were answered.   Follow-up: Return in about 2 weeks (around 02/28/2022) for HROB visit and growth in 4 wks.   Future Appointments  Date Time Provider Department Center  03/01/2022  1:55 PM [facility]CHO 4 [id number]  04/05/2022  2:00 PM Meriam Sprague, MD [facility] None  04/10/2022  8:00 AM Gilmore Laroche, FNP [facility]    No orders of the defined types were placed in this encounter.   Myna Hidalgo, DO Attending Obstetrician & Gynecologist, [facility] for [employer], [facility]

## 2022-02-17 LAB — URINE CULTURE

## 2022-02-18 ENCOUNTER — Other Ambulatory Visit: Payer: Self-pay | Admitting: Obstetrics & Gynecology

## 2022-02-18 DIAGNOSIS — O2343 Unspecified infection of urinary tract in pregnancy, third trimester: Secondary | ICD-10-CM

## 2022-02-18 LAB — GC/CHLAMYDIA PROBE AMP
Chlamydia trachomatis, NAA: NEGATIVE
Neisseria Gonorrhoeae by PCR: NEGATIVE

## 2022-02-18 MED ORDER — NITROFURANTOIN MONOHYD MACRO 100 MG PO CAPS: 100.0000 mg | ORAL_CAPSULE | Freq: Two times a day (BID) | ORAL | 0 refills | Status: AC

## 2022-02-18 NOTE — Progress Notes (Signed)
Prescription sent in for macrobid

## 2022-02-21 NOTE — BH Specialist Note (Unsigned)
Integrated Behavioral Health via Telemedicine Visit  02/21/2022 [name] [id number]  Number of Integrated Behavioral Health Clinician visits: No data recorded Session Start time: No data recorded  Session End time: No data recorded Total time in minutes: No data recorded  Referring Provider: Myna Hidalgo, MD Patient/Family location: Home*** [facility] Provider location: [facility] for [facility] Healthcare at [facility]  All persons participating in visit: Patient [name] [name] and [facility] Myles Tavella ***  Types of Service: {CHL AMB TYPE OF SERVICE:[phone]}  I connected with [name] and/or Darrin Nipper Lofland's {family members:20773} via  Telephone or Video Enabled Telemedicine Application  (Video is Caregility application) and verified that I am speaking with the correct person using two identifiers. Discussed confidentiality: Yes   I discussed the limitations of telemedicine and the availability of in person appointments.  Discussed there is a possibility of technology failure and discussed alternative modes of communication if that failure occurs.  I discussed that engaging in this telemedicine visit, they consent to the provision of behavioral healthcare and the services will be billed under their insurance.  Patient and/or legal guardian expressed understanding and consented to Telemedicine visit: Yes   Presenting Concerns: Patient and/or family reports the following symptoms/concerns: *** Duration of problem: ***; Severity of problem: {Mild/Moderate/Severe:20260}  Patient and/or Family's Strengths/Protective Factors: {CHL AMB BH PROTECTIVE FACTORS:[id number]}  Goals Addressed: Patient will:  Reduce symptoms of: {IBH Symptoms:21014056}   Increase knowledge and/or ability of: {IBH Patient Tools:21014057}   Demonstrate ability to: {IBH Goals:21014053}  Progress towards Goals: Ongoing  Interventions: Interventions utilized:  {IBH  Interventions:21014054} Standardized Assessments completed: {IBH Screening Tools:21014051}  Patient and/or Family Response: Patient agrees with treatment plan.    Assessment: Patient currently experiencing ***.   Patient may benefit from psychoeducation and brief therapeutic interventions regarding coping with symptoms of *** .  Plan: Follow up with behavioral health clinician on : *** Behavioral recommendations:  -*** -*** Referral(s): {IBH Referrals:21014055}  I discussed the assessment and treatment plan with the patient and/or parent/guardian. They were provided an opportunity to ask questions and all were answered. They agreed with the plan and demonstrated an understanding of the instructions.   They were advised to call back or seek an in-person evaluation if the symptoms worsen or if the condition fails to improve as anticipated.  Rae Lips, LCSW     02/28/2022    8:52 AM 02/01/2022   11:33 AM 01/08/2022    8:17 AM 11/14/2021   11:05 AM 12/26/2020   10:38 AM  Depression screen PHQ 2/9  Decreased Interest 2 3 0 1 1  Down, Depressed, Hopeless 2 3 1 2 1   PHQ - 2 Score 4 6 1 3 2   Altered sleeping 3 2  3 3   Tired, decreased energy 2 2  3 3   Change in appetite 1 2  0 0  Feeling bad or failure about yourself  3 3  0 0  Trouble concentrating 1 3  0 0  Moving slowly or fidgety/restless 0 0  0 0  Suicidal thoughts 0 0  0 0  PHQ-9 Score 14 18  9 8   Difficult doing work/chores  Extremely dIfficult         02/28/2022    8:52 AM 02/01/2022   11:35 AM 11/14/2021   11:05 AM 12/26/2020   10:38 AM  GAD 7 : Generalized Anxiety Score  Nervous, Anxious, on Edge 3 2 3  0  Control/stop worrying 3 3 0 0  Worry too much - different things 2 3 0 0  Trouble relaxing 3 3 0 0  Restless 3 0 0 0  Easily annoyed or irritable 3 3 0 0  Afraid - awful might happen 3 3 3  0  Total GAD 7 Score 20 17 6  0  Anxiety Difficulty  Very difficult

## 2022-02-27 ENCOUNTER — Telehealth: Payer: Self-pay | Admitting: Obstetrics & Gynecology

## 2022-02-27 NOTE — Telephone Encounter (Signed)
Left message @ 4:38 pm to discuss at her visit tomorrow. JSY

## 2022-02-27 NOTE — Telephone Encounter (Signed)
Patient states she would also like to get the genetic labs tomorrow Please advise.

## 2022-02-28 ENCOUNTER — Ambulatory Visit (INDEPENDENT_AMBULATORY_CARE_PROVIDER_SITE_OTHER): Payer: Medicaid Other | Admitting: Obstetrics & Gynecology

## 2022-02-28 ENCOUNTER — Encounter: Payer: Self-pay | Admitting: Obstetrics & Gynecology

## 2022-02-28 ENCOUNTER — Other Ambulatory Visit: Payer: Medicaid Other

## 2022-02-28 VITALS — BP 130/78 | HR 95 | Wt 356.6 lb

## 2022-02-28 DIAGNOSIS — O132 Gestational [pregnancy-induced] hypertension without significant proteinuria, second trimester: Secondary | ICD-10-CM

## 2022-02-28 DIAGNOSIS — O2343 Unspecified infection of urinary tract in pregnancy, third trimester: Secondary | ICD-10-CM

## 2022-02-28 DIAGNOSIS — Z6841 Body Mass Index (BMI) 40.0 and over, adult: Secondary | ICD-10-CM

## 2022-02-28 DIAGNOSIS — Z3A27 27 weeks gestation of pregnancy: Secondary | ICD-10-CM

## 2022-02-28 DIAGNOSIS — Z131 Encounter for screening for diabetes mellitus: Secondary | ICD-10-CM

## 2022-02-28 DIAGNOSIS — O0992 Supervision of high risk pregnancy, unspecified, second trimester: Secondary | ICD-10-CM

## 2022-02-28 LAB — POCT URINALYSIS DIPSTICK OB
Glucose, UA: NEGATIVE
Ketones, UA: NEGATIVE
Nitrite, UA: NEGATIVE

## 2022-02-28 NOTE — Progress Notes (Signed)
HIGH-RISK PREGNANCY VISIT Patient name: [name] [name] MRN [id number]  Date of birth: 19-Sep-1985 Chief Complaint:   High Risk Gestation (PN2 today)  History of Present Illness:   [name] [name] is a 36 y.o. [id number] female at [redacted]w[redacted]d with an Estimated Date of Delivery: 05/27/22 being seen today for ongoing management of a high-risk pregnancy complicated by:  UTI- currently being treated, []  TOC next visit Gestational HTN- on ProcardiaXL 30mg  daily Morbid obesity  Rh neg  Today she reports no complaints.   Contractions: Not present. Vag. Bleeding: None.  Movement: Present. denies leaking of fluid.      02/28/2022    8:52 AM 02/01/2022   11:33 AM 01/08/2022    8:17 AM 11/14/2021   11:05 AM 12/26/2020   10:38 AM  Depression screen PHQ 2/9  Decreased Interest 2 3 0 1 1  Down, Depressed, Hopeless 2 3 1 2 1   PHQ - 2 Score 4 6 1 3 2   Altered sleeping 3 2  3 3   Tired, decreased energy 2 2  3 3   Change in appetite 1 2  0 0  Feeling bad or failure about yourself  3 3  0 0  Trouble concentrating 1 3  0 0  Moving slowly or fidgety/restless 0 0  0 0  Suicidal thoughts 0 0  0 0  PHQ-9 Score 14 18  9 8   Difficult doing work/chores  Extremely dIfficult        Current Outpatient Medications  Medication Instructions   acetaminophen (TYLENOL) 1,000 mg, Oral, Daily   albuterol (VENTOLIN HFA) 108 (90 Base) MCG/ACT inhaler 2 puffs, Inhalation, Every 6 hours PRN   aspirin EC 81 mg, Oral, Daily, Swallow whole.   Blood Pressure Monitor MISC For regular home bp monitoring during pregnancy<BR>Needs large cuff   cetirizine (ZYRTEC) 10 mg, Oral, Daily   Doxylamine-Pyridoxine (DICLEGIS) 10-10 MG TBEC 2 tabs q hs, if sx persist add 1 tab q am on day 3, if sx persist add 1 tab q afternoon on day 4   fluticasone (FLONASE) 50 MCG/ACT nasal spray 1 spray, Each Nare, Daily   FOLIC ACID PO Oral   hydrOXYzine (VISTARIL) 25 mg, Oral, At bedtime PRN   MELATONIN PO 10 mg, Oral, Daily   montelukast (SINGULAIR)  10 mg, Oral, Daily at bedtime   NIFEdipine (PROCARDIA-XL/NIFEDICAL-XL) 30 mg, Oral, Daily   Prenatal Vit-Fe Fumarate-FA (PRENATAL VITAMIN PO) Oral   UNABLE TO FIND Med Name: maternal belly band/ support belt<BR>[redacted] weeks pregnant     Review of Systems:   Pertinent items are noted in HPI Denies abnormal vaginal discharge w/ itching/odor/irritation, headaches, visual changes, shortness of breath, chest pain, abdominal pain, severe nausea/vomiting, or problems with urination or bowel movements unless otherwise stated above. Pertinent History Reviewed:  Reviewed past medical,surgical, social, obstetrical and family history.  Reviewed problem list, medications and allergies. Physical Assessment:   Vitals:   02/28/22 0848  BP: 130/78  Pulse: 95  Weight: (!) 356 lb 9.6 oz (161.8 kg)  Body mass index is 60.26 kg/m.           Physical Examination:   General appearance: alert, well appearing, and in no distress  Mental status: normal mood, behavior, speech, dress, motor activity, and thought processes  Skin: warm & dry   Extremities: Edema: Trace    Cardiovascular: normal heart rate noted  Respiratory: normal respiratory effort, no distress  Abdomen: gravid, soft, non-tender  Pelvic: Cervical exam deferred  Fetal Status: Fetal Heart Rate (bpm): 150   Movement: Present   Uterine size- difficult to assess due to obesity  Fetal Surveillance Testing today: doppler   Chaperone: N/A    Results for orders placed or performed in visit on 02/28/22 (from the past 24 hour(s))  POC Urinalysis Dipstick OB   Collection Time: 02/28/22  8:54 AM  Result Value Ref Range   Color, UA     Clarity, UA     Glucose, UA Negative Negative   Bilirubin, UA     Ketones, UA neg    Spec Grav, UA     Blood, UA trace    pH, UA     POC,PROTEIN,UA Trace Negative, Trace, Small (1+), Moderate (2+), Large (3+), 4+   Urobilinogen, UA     Nitrite, UA neg    Leukocytes, UA Moderate (2+) (A) Negative    Appearance     Odor       Assessment & Plan:  High-risk pregnancy: [id number] at [redacted]w[redacted]d with an Estimated Date of Delivery: 05/27/22   1) GestHTN -continue Procardia Growth scheduled, continue q 4wk -start antepartum testing @ 32wks  2) UTI -on treatment, []  TOC next visit  []  RhoGAM next visit  3) TOLAC -reviewed risk/benefit, pt given handout/form []  plan to sign next visit  Meds: No orders of the defined types were placed in this encounter.   Labs/procedures today: PN2  Treatment Plan:  as outlined above  Reviewed: Preterm labor symptoms and general obstetric precautions including but not limited to vaginal bleeding, contractions, leaking of fluid and fetal movement were reviewed in detail with the patient.  All questions were answered. Pt home bp cuff. Check bp weekly, let know if >140/90.   Follow-up: Return for keep 2 week appt and schedule growth every 4wk, then in 4wks (32wk)- NST/BPP weekly.   Future Appointments  Date Time Provider Department Center  03/01/2022  1:55 PM [facility] ECHO 4 [id number]  03/07/2022  9:15 AM [facility]-BEHAVIORAL HEALTH CLINICIAN [facility]  03/13/2022  3:00 PM [facility] - FTOBGYN 03/09/2022 [facility]-FTIMG None  03/13/2022  3:50 PM 03/15/2022, DO [facility]-FT FTOBGYN  04/05/2022  2:00 PM 03/15/2022, MD [facility] None  04/10/2022  8:00 AM 04/07/2022, FNP RPC-RPC RPC    Orders Placed This Encounter  Procedures   POC Urinalysis Dipstick OB    Meriam Sprague, DO Attending Obstetrician & Gynecologist, [facility] for [place], [facility]

## 2022-03-01 ENCOUNTER — Ambulatory Visit (HOSPITAL_COMMUNITY): Payer: Medicaid Other | Attending: Cardiovascular Disease

## 2022-03-01 ENCOUNTER — Encounter (HOSPITAL_COMMUNITY): Payer: Self-pay | Admitting: Radiology

## 2022-03-01 LAB — CBC
Hematocrit: 38.8 % (ref 34.0–46.6)
Hemoglobin: 12.4 g/dL (ref 11.1–15.9)
MCH: 26.5 pg — ABNORMAL LOW (ref 26.6–33.0)
MCHC: 32 g/dL (ref 31.5–35.7)
MCV: 83 fL (ref 79–97)
Platelets: 213 10*3/uL (ref 150–450)
RBC: 4.68 x10E6/uL (ref 3.77–5.28)
RDW: 15.5 % — ABNORMAL HIGH (ref 11.7–15.4)
WBC: 9 10*3/uL (ref 3.4–10.8)

## 2022-03-01 LAB — RPR: RPR Ser Ql: NONREACTIVE

## 2022-03-01 LAB — GLUCOSE TOLERANCE, 2 HOURS W/ 1HR
Glucose, 1 hour: 101 mg/dL (ref 70–179)
Glucose, 2 hour: 84 mg/dL (ref 70–152)
Glucose, Fasting: 77 mg/dL (ref 70–91)

## 2022-03-01 LAB — HIV ANTIBODY (ROUTINE TESTING W REFLEX): HIV Screen 4th Generation wRfx: NONREACTIVE

## 2022-03-01 LAB — ANTIBODY SCREEN: Antibody Screen: NEGATIVE

## 2022-03-01 NOTE — Progress Notes (Signed)
.  Just an FYI. We have made several attempts to schedule this patient. The patient has now shown 4 times since 12/2021. We will be removing the patient from the echo WQ. Please reorder if clinically necessary.  No show dates: 01/07/22 01/25/2022 02/12/2022 03/01/2022   Thank you

## 2022-03-07 ENCOUNTER — Ambulatory Visit (INDEPENDENT_AMBULATORY_CARE_PROVIDER_SITE_OTHER): Payer: Medicaid Other | Admitting: Clinical

## 2022-03-07 DIAGNOSIS — Z8659 Personal history of other mental and behavioral disorders: Secondary | ICD-10-CM

## 2022-03-07 DIAGNOSIS — F3160 Bipolar disorder, current episode mixed, unspecified: Secondary | ICD-10-CM

## 2022-03-07 NOTE — Patient Instructions (Addendum)
Center for [facility] Healthcare at [facility] for [facility] [address] [place], [place] [address] [phone] (main office) [phone] (Anquan Azzarello's office)  Mother To Baby [email or web address].[email or web address]  /Emotional Wellbeing Writer Here are a few free apps meant to help you to help yourself.  To find, try searching on the internet to see if the app is offered on Apple/Android devices. If your first choice doesn't come up on your device, the good news is that there are many choices! Play around with different apps to see which ones are helpful to you.    Calm This is an app meant to help increase calm feelings. Includes info, strategies, and tools for tracking your feelings.      Calm Harm  This app is meant to help with self-harm. Provides many 5-minute or 15-min coping strategies for doing instead of hurting yourself.       Healthy Minds Health Minds is a problem-solving tool to help deal with emotions and cope with stress you encounter wherever you are.      MindShift This app can help people cope with anxiety. Rather than trying to avoid anxiety, you can make an important shift and face it.      MY3  MY3 features a support system, safety plan and resources with the goal of offering a tool to use in a time of need.       My Life My Voice  This mood journal offers a simple solution for tracking your thoughts, feelings and moods. Animated emoticons can help identify your mood.       Relax Melodies Designed to help with sleep, on this app you can mix sounds and meditations for relaxation.      Smiling Mind Smiling Mind is meditation made easy: it's a simple tool that helps put a smile on your mind.        Stop, Breathe & Think  A friendly, simple guide for people through meditations for mindfulness and compassion.  Stop, Breathe and Think Kids Enter your current feelings and choose a "mission" to help you cope. Offers videos for certain moods instead of  just sound recordings.       Team Orange The goal of this tool is to help teens change how they think, act, and react. This app helps you focus on your own good feelings and experiences.      The [employer] Box The [employer] Box (VHB) contains simple tools to help patients with coping, relaxation, distraction, and positive thinking.

## 2022-03-08 ENCOUNTER — Ambulatory Visit (INDEPENDENT_AMBULATORY_CARE_PROVIDER_SITE_OTHER): Payer: Medicaid Other | Admitting: *Deleted

## 2022-03-08 DIAGNOSIS — Z3A28 28 weeks gestation of pregnancy: Secondary | ICD-10-CM | POA: Diagnosis not present

## 2022-03-08 DIAGNOSIS — Z23 Encounter for immunization: Secondary | ICD-10-CM | POA: Diagnosis not present

## 2022-03-08 DIAGNOSIS — Z6791 Unspecified blood type, Rh negative: Secondary | ICD-10-CM

## 2022-03-08 DIAGNOSIS — O26893 Other specified pregnancy related conditions, third trimester: Secondary | ICD-10-CM | POA: Diagnosis not present

## 2022-03-08 DIAGNOSIS — O0993 Supervision of high risk pregnancy, unspecified, third trimester: Secondary | ICD-10-CM | POA: Diagnosis not present

## 2022-03-08 NOTE — Progress Notes (Signed)
   NURSE VISIT- INJECTION  SUBJECTIVE:  [name] [name] is a 36 y.o. [phone] female here for a Rhophylac and Tdap for protection from tetanus, diptheria, and pertussis and Rh neg status during pregnancy. She is [redacted]w[redacted]d pregnant. Pt mentioned she is only sleeping 3 hours a night despite taking Hydroxyzine 50 mg and Melatonin at bedtime. Pt was advised to discuss that at her visit with Dr. Charlotta Newton.   OBJECTIVE:  LMP 08/20/2021   Appears well, in no apparent distress  Injection administered in: Left deltoid-Tdap; left hip-Rhogam  No orders of the defined types were placed in this encounter.   ASSESSMENT: Pregnancy [redacted]w[redacted]d Rhophylac and Tdap for protection from tetanus, diptheria, and pertussis and Rh neg status during pregnancy PLAN: Follow-up: as scheduled   Malachy Mood  03/08/2022 12:13 PM

## 2022-03-11 NOTE — BH Specialist Note (Deleted)
Integrated Behavioral Health via Telemedicine Visit  03/11/2022 [name] [name] [id number]  Number of Integrated Behavioral Health Clinician visits: 1- Initial Visit  Session Start time: [phone]   Session End time: 1005  Total time in minutes: 49   Referring Provider: Myna Hidalgo, MD Patient/Family location: Home*** [facility] Provider location: Center for [facility] Healthcare at [facility]  All persons participating in visit: Patient [name] [name] and [facility] [name] [name] ***  Types of Service: {CHL AMB TYPE OF [phone]}  I connected with [name] [name] and/or [name] [name]'s {family members:20773} via  Telephone or Video Enabled Telemedicine Application  (Video is Caregility application) and verified that I am speaking with the correct person using two identifiers. Discussed confidentiality: Yes   I discussed the limitations of telemedicine and the availability of in person appointments.  Discussed there is a possibility of technology failure and discussed alternative modes of communication if that failure occurs.  I discussed that engaging in this telemedicine visit, they consent to the provision of behavioral healthcare and the services will be billed under their insurance.  Patient and/or legal guardian expressed understanding and consented to Telemedicine visit: Yes   Presenting Concerns: Patient and/or family reports the following symptoms/concerns: *** Duration of problem: Ongoing ***; Severity of problem: severe ***  Patient and/or Family's Strengths/Protective Factors: Concrete supports in place (healthy food, safe environments, etc.) and Sense of purpose ***  Goals Addressed: Patient will:  Reduce symptoms of: anxiety, depression, and stress   Increase knowledge and/or ability of: {IBH Patient Tools:21014057}   Demonstrate ability to: {IBH Goals:21014053}  Progress towards Goals: Ongoing  Interventions: Interventions utilized:  {IBH  Interventions:21014054} Standardized Assessments completed: {IBH Screening Tools:21014051}  Patient and/or Family Response: Patient agrees with treatment plan.   Assessment: Patient currently experiencing Bipolar affective disorder, PTSD, ADHD (all previously diagnosed); Psychosocial stress ***.   Patient may benefit from continued therapeutic intervention ***.  Plan: Follow up with behavioral health clinician on : *** Behavioral recommendations:  -*** -***self care, prenatal, vistaril, outdoor time daily, calm*** Referral(s): {IBH Referrals:21014055}  I discussed the assessment and treatment plan with the patient and/or parent/guardian. They were provided an opportunity to ask questions and all were answered. They agreed with the plan and demonstrated an understanding of the instructions.   They were advised to call back or seek an in-person evaluation if the symptoms worsen or if the condition fails to improve as anticipated.  [name] [name] [name] Forquer, LCSW

## 2022-03-12 ENCOUNTER — Other Ambulatory Visit: Payer: Self-pay | Admitting: Obstetrics & Gynecology

## 2022-03-12 DIAGNOSIS — O132 Gestational [pregnancy-induced] hypertension without significant proteinuria, second trimester: Secondary | ICD-10-CM

## 2022-03-13 ENCOUNTER — Ambulatory Visit: Payer: Medicaid Other

## 2022-03-13 ENCOUNTER — Ambulatory Visit (INDEPENDENT_AMBULATORY_CARE_PROVIDER_SITE_OTHER): Payer: Medicaid Other

## 2022-03-13 ENCOUNTER — Ambulatory Visit (INDEPENDENT_AMBULATORY_CARE_PROVIDER_SITE_OTHER): Payer: Medicaid Other | Admitting: Obstetrics & Gynecology

## 2022-03-13 ENCOUNTER — Encounter: Payer: Self-pay | Admitting: *Deleted

## 2022-03-13 ENCOUNTER — Encounter: Payer: Self-pay | Admitting: Obstetrics & Gynecology

## 2022-03-13 VITALS — BP 129/87 | HR 109 | Wt 355.8 lb

## 2022-03-13 DIAGNOSIS — Z8759 Personal history of other complications of pregnancy, childbirth and the puerperium: Secondary | ICD-10-CM

## 2022-03-13 DIAGNOSIS — Z3A29 29 weeks gestation of pregnancy: Secondary | ICD-10-CM | POA: Diagnosis not present

## 2022-03-13 DIAGNOSIS — Z6791 Unspecified blood type, Rh negative: Secondary | ICD-10-CM

## 2022-03-13 DIAGNOSIS — O99210 Obesity complicating pregnancy, unspecified trimester: Secondary | ICD-10-CM

## 2022-03-13 DIAGNOSIS — O26893 Other specified pregnancy related conditions, third trimester: Secondary | ICD-10-CM

## 2022-03-13 DIAGNOSIS — O132 Gestational [pregnancy-induced] hypertension without significant proteinuria, second trimester: Secondary | ICD-10-CM

## 2022-03-13 DIAGNOSIS — O0993 Supervision of high risk pregnancy, unspecified, third trimester: Secondary | ICD-10-CM

## 2022-03-13 DIAGNOSIS — O2343 Unspecified infection of urinary tract in pregnancy, third trimester: Secondary | ICD-10-CM

## 2022-03-13 DIAGNOSIS — O0992 Supervision of high risk pregnancy, unspecified, second trimester: Secondary | ICD-10-CM

## 2022-03-13 LAB — POCT URINALYSIS DIPSTICK OB
Blood, UA: NEGATIVE
Glucose, UA: NEGATIVE
Ketones, UA: NEGATIVE
Leukocytes, UA: NEGATIVE
Nitrite, UA: NEGATIVE

## 2022-03-13 NOTE — Progress Notes (Signed)
[place] 29+2 wks,cephalic,anterior placenta gr 0,AFI 14 cm,FHR 150 bpm,EFW 1373 g 38%,limited view because of pt body habitus

## 2022-03-13 NOTE — Progress Notes (Signed)
HIGH-RISK PREGNANCY VISIT Patient name: [name] [name] MRN [id number]  Date of birth: [birth year] Chief Complaint:   Routine Prenatal Visit  History of Present Illness:   [name] [name] is a 36 y.o. [id number] female at [redacted]w[redacted]d with an Estimated Date of Delivery: 05/27/22 being seen today for ongoing management of a high-risk pregnancy complicated by  -Gest HTN -Prior C-section- desires TOLAC -Morbid obesity -UTI- s/p treatment  Today she reports no complaints.   Contractions: Irritability. Vag. Bleeding: None.  Movement: Present. denies leaking of fluid.      02/28/2022    8:52 AM 02/01/2022   11:33 AM 01/08/2022    8:17 AM 11/14/2021   11:05 AM 12/26/2020   10:38 AM  Depression screen PHQ 2/9  Decreased Interest 2 3 0 1 1  Down, Depressed, Hopeless 2 3 1 2 1   PHQ - 2 Score 4 6 1 3 2   Altered sleeping 3 2  3 3   Tired, decreased energy 2 2  3 3   Change in appetite 1 2  0 0  Feeling bad or failure about yourself  3 3  0 0  Trouble concentrating 1 3  0 0  Moving slowly or fidgety/restless 0 0  0 0  Suicidal thoughts 0 0  0 0  PHQ-9 Score 14 18  9 8   Difficult doing work/chores  Extremely dIfficult        Current Outpatient Medications  Medication Instructions   acetaminophen (TYLENOL) 1,000 mg, Oral, Daily   albuterol (VENTOLIN HFA) 108 (90 Base) MCG/ACT inhaler 2 puffs, Inhalation, Every 6 hours PRN   aspirin EC 81 mg, Oral, Daily, Swallow whole.   Blood Pressure Monitor MISC For regular home bp monitoring during pregnancy<BR>Needs large cuff   cetirizine (ZYRTEC) 10 mg, Oral, Daily   Doxylamine-Pyridoxine (DICLEGIS) 10-10 MG TBEC 2 tabs q hs, if sx persist add 1 tab q am on day 3, if sx persist add 1 tab q afternoon on day 4   fluticasone (FLONASE) 50 MCG/ACT nasal spray 1 spray, Each Nare, Daily   FOLIC ACID PO Oral   hydrOXYzine (VISTARIL) 25 mg, Oral, At bedtime PRN   MELATONIN PO 10 mg, Oral, Daily   montelukast (SINGULAIR) 10 mg, Oral, Daily at bedtime   NIFEdipine  (PROCARDIA-XL/NIFEDICAL-XL) 30 mg, Oral, Daily   Prenatal Vit-Fe Fumarate-FA (PRENATAL VITAMIN PO) Oral   UNABLE TO FIND Med Name: maternal belly band/ support belt<BR>[redacted] weeks pregnant     Review of Systems:   Pertinent items are noted in HPI Denies abnormal vaginal discharge w/ itching/odor/irritation, headaches, visual changes, shortness of breath, chest pain, abdominal pain, severe nausea/vomiting, or problems with urination or bowel movements unless otherwise stated above. Pertinent History Reviewed:  Reviewed past medical,surgical, social, obstetrical and family history.  Reviewed problem list, medications and allergies. Physical Assessment:   Vitals:   03/13/22 1553 03/13/22 1558  BP: (!) 132/92 129/87  Pulse: (!) 108 (!) 109  Weight: (!) 355 lb 12.8 oz (161.4 kg)   Body mass index is 60.13 kg/m.           Physical Examination:   General appearance: alert, well appearing, and in no distress  Mental status: normal mood, behavior, speech, dress, motor activity, and thought processes  Skin: warm & dry   Extremities: Edema: Trace    Cardiovascular: normal heart rate noted  Respiratory: normal respiratory effort, no distress  Abdomen: gravid, soft, non-tender  Pelvic: Cervical exam deferred  Fetal Status:     Movement: Present    Fetal Surveillance Testing today: cephalic,anterior placenta gr 0,AFI 14 cm,FHR 150 bpm,EFW 1373 g 38%,limited view because of pt body habitus    Chaperone: N/A    Results for orders placed or performed in visit on 03/13/22 (from the past 24 hour(s))  POC Urinalysis Dipstick OB   Collection Time: 03/13/22  3:56 PM  Result Value Ref Range   Color, UA     Clarity, UA     Glucose, UA Negative Negative   Bilirubin, UA     Ketones, UA neg    Spec Grav, UA     Blood, UA neg    pH, UA     POC,PROTEIN,UA Trace Negative, Trace, Small (1+), Moderate (2+), Large (3+), 4+   Urobilinogen, UA     Nitrite, UA neg    Leukocytes, UA Negative  Negative   Appearance     Odor       Assessment & Plan:  High-risk pregnancy: [id number] at [redacted]w[redacted]d with an Estimated Date of Delivery: 05/27/22   1) Chronic HTN -growth scan today as above, continue q 4wks -antepartum testing to start @ 32wks  2) TOLAC -reviewed consent form- discussed risk/benefit including but not limited to risk of uterine rupture and potential for significant complications such as fetal brain injury or death. Questions and concerns were addressed, desires TOLAC- consent obtained  3) UTI S/p treatment Plan to recheck urine today  Meds: No orders of the defined types were placed in this encounter.   Labs/procedures today: growth scan  Treatment Plan:  as outlined above  Reviewed: Preterm labor symptoms and general obstetric precautions including but not limited to vaginal bleeding, contractions, leaking of fluid and fetal movement were reviewed in detail with the patient.  All questions were answered. Pt has home bp cuff. Check bp weekly, let us know if >140/90.   Follow-up: Return for as scheduled.   Future Appointments  Date Time Provider [place]  03/21/2022  9:45 AM [facility]  04/01/2022  9:50 AM [facility]-FTOBGYN NURSE [facility]-FT FTOBGYN  04/04/2022 11:15 AM [facility] - FTOBGYN [place] C[facility]-FTIMG None  04/04/2022 12:00 PM Janyth Pupa, DO [facility]-FT FTO[facility]YN  04/05/2022  2:00 PM Freada Bergeron, MD [facility] None  04/08/2022  9:50 AM [facility]-FTOBGYN NURSE [facility]-FT FTO[facility]YN  04/10/2022  8:00 AM Alvira Monday, [place] RPC-RPC RPC  04/11/2022 10:45 AM [place] - FTOBGYN [place] C[facility]-FTIMG None  04/11/2022 11:50 AM Florian Buff, MD C[facility]-FT [facility]TO[facility]YN  04/15/2022  9:30 AM [facility]-FTOBGYN NURSE C[facility]-FT FTO[facility]YN  04/18/2022 10:45 AM [facility] - FTOBGYN [place] C[facility]-FTIMG None  04/18/2022 11:50 AM Florian Buff, MD [facility]-FT [facility]TO[facility]  04/22/2022  9:50 AM [facility]-FTOBGYN NURSE C[facility]-FT FTO[facility]YN  04/25/2022 10:00 AM [facility] - FTOBGYN [place] C[facility]-FTIMG None  04/25/2022 11:10 AM Florian Buff, MD [facility]-FT FTO[facility]YN   04/29/2022  9:50 AM [facility]-FTOBGYN NURSE [facility]-FT FTO[facility]YN  05/02/2022  9:15 AM [facility] - FTOBGYN [place] C[facility]-FTIMG None  05/02/2022 10:10 AM Janyth Pupa, DO [facility]-FT FTOBGYN  05/06/2022  9:30 AM [facility]-FTOBGYN NURSE C[facility]-FT FTO[facility]YN  05/09/2022  9:15 AM [facility] - FTOBGYN [place] C[facility]-FTIMG None  05/09/2022 10:10 AM Florian Buff, MD [facility]-FT [facility]TO[facility]  05/13/2022  9:30 AM [facility]-FTOBGYN NURSE [facility]-FT FTO[facility]YN  05/16/2022  8:30 AM [facility] - FTOBGYN [place] C[facility]-FTIMG None  05/16/2022  9:30 AM Florian Buff, MD [facility]-FT [facility]TO[facility]YN  05/20/2022  9:30 AM [facility]-FTOBGYN NURSE [facility]-FT [facility]TO[facility]YN  05/23/2022  9:15 AM [facility] - FTOBGYN [place] C[facility]-FTIMG None  05/23/2022 10:10 AM Lazaro Arms, MD C[facility]-FT FTOBGYN  05/27/2022  9:30 AM [facility]-FTOBGYN NURSE C[facility]-FT FTOBGYN    Orders Placed This Encounter  Procedures   POC Urinalysis Dipstick OB    Myna Hidalgo, DO Attending Obstetrician & Gynecologist, [facility] for [employer], [facility]

## 2022-03-14 ENCOUNTER — Encounter: Payer: Self-pay | Admitting: Obstetrics & Gynecology

## 2022-03-16 ENCOUNTER — Emergency Department (HOSPITAL_COMMUNITY)
Admission: EM | Admit: 2022-03-16 | Discharge: 2022-03-16 | Disposition: A | Discharge status: Home / Self Care | Payer: Medicaid Other | Attending: Emergency Medicine | Admitting: Emergency Medicine

## 2022-03-16 ENCOUNTER — Other Ambulatory Visit: Payer: Self-pay

## 2022-03-16 ENCOUNTER — Encounter (HOSPITAL_COMMUNITY): Payer: Self-pay | Admitting: Emergency Medicine

## 2022-03-16 DIAGNOSIS — R000 Tachycardia, unspecified: Secondary | ICD-10-CM | POA: Diagnosis not present

## 2022-03-16 DIAGNOSIS — Z7951 Long term (current) use of inhaled steroids: Secondary | ICD-10-CM | POA: Insufficient documentation

## 2022-03-16 DIAGNOSIS — Z7982 Long term (current) use of aspirin: Secondary | ICD-10-CM | POA: Insufficient documentation

## 2022-03-16 DIAGNOSIS — U071 COVID-19: Secondary | ICD-10-CM | POA: Insufficient documentation

## 2022-03-16 DIAGNOSIS — R519 Headache, unspecified: Secondary | ICD-10-CM | POA: Diagnosis present

## 2022-03-16 DIAGNOSIS — J45909 Unspecified asthma, uncomplicated: Secondary | ICD-10-CM | POA: Insufficient documentation

## 2022-03-16 LAB — RESP PANEL BY RT-PCR (FLU A&B, COVID) ARPGX2
Influenza A by PCR: NEGATIVE
Influenza B by PCR: NEGATIVE
SARS Coronavirus 2 by RT PCR: POSITIVE — AB

## 2022-03-16 LAB — URINALYSIS, ROUTINE W REFLEX MICROSCOPIC
Bilirubin Urine: NEGATIVE
Glucose, UA: NEGATIVE mg/dL
Hgb urine dipstick: NEGATIVE
Ketones, ur: 5 mg/dL — AB
Nitrite: NEGATIVE
Protein, ur: 100 mg/dL — AB
Specific Gravity, Urine: 1.033 — ABNORMAL HIGH (ref 1.005–1.030)
WBC, UA: >50 WBC/hpf — ABNORMAL HIGH (ref 0–5)
pH: 5 (ref 5.0–8.0)

## 2022-03-16 LAB — GROUP A STREP BY PCR: Group A Strep by PCR: NOT DETECTED

## 2022-03-16 NOTE — Discharge Instructions (Addendum)
You have COVID.  Please remain home for the next few days, take Tylenol for headaches and body aches.  Please follow-up with your gynecologist Monday for reevaluation, the urine was collected here and the culture will take 3 days to grow back.  If positive then may need to start you on an antibiotic.  If you have any abdominal pain or vaginal bleeding return back to ED.

## 2022-03-16 NOTE — ED Triage Notes (Signed)
Pt to the ED with complaints of flu-like symptoms for the last 4 days.  Pt states her employer wants a note stating she does not have a communicable disease prior to returning.

## 2022-03-16 NOTE — ED Provider Notes (Signed)
[facility] EMERGENCY DEPARTMENT Provider Note   CSN: [id number] Arrival date & time: 03/16/22  1250     History  Chief Complaint  Patient presents with   Nasal Congestion    [name] [name] is a 36 y.o. female.  HPI   Patient with medical history of bipolar 1, asthma, anxiety, reflux disease, morbid obesity presents today due to flulike symptoms x4 days.  She has been having a productive cough with green sputum, nausea, and nonbilious nonbloody emesis, myalgias, headaches, sore throat, nasal congestion.  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  She denies any abdominal pain, chest pain, shortness of breath, hemoptysis, vaginal bleeding, flank pain.  Patient is in her third trimester of supervised pregnancy. [redacted]w[redacted]d She is been taking Diclegis for nausea which was helpful until yesterday.  Recently had a UTI with and was on Macrobid.  Patient is asymptomatic, she is asymptomatic but was unable to give urine sample at gynecology office 3 days ago to confirm resolution so we will check today.   Home Medications Prior to Admission medications   Medication Sig Start Date End Date Taking? Authorizing Provider  acetaminophen (TYLENOL) 500 MG tablet Take 1,000 mg by mouth daily.    [provider]  albuterol (VENTOLIN HFA) 108 (90 Base) MCG/ACT inhaler Inhale 2 puffs into the lungs every 6 (six) hours as needed for wheezing or shortness of breath. 01/08/22   Gilmore Laroche, FNP  aspirin EC 81 MG tablet Take 1 tablet (81 mg total) by mouth daily. Swallow whole. 11/14/21   Marny Lowenstein, PA-C  Blood Pressure Monitor MISC For regular home bp monitoring during pregnancy Needs large cuff 11/14/21   Marny Lowenstein, PA-C  cetirizine (ZYRTEC) 10 MG tablet Take 1 tablet (10 mg total) by mouth daily. 01/08/22   Gilmore Laroche, FNP  Doxylamine-Pyridoxine (DICLEGIS) 10-10 MG TBEC 2 tabs q hs, if sx persist add 1 tab q am on day 3, if  sx persist add 1 tab q afternoon on day 4 10/22/21   Cheral Marker, CNM  fluticasone (FLONASE) 50 MCG/ACT nasal spray Place 1 spray into both nostrils daily. 12/31/21   Valentino Nose, NP  FOLIC ACID PO Take by mouth.    [provider]  hydrOXYzine (VISTARIL) 25 MG capsule Take 1 capsule (25 mg total) by mouth at bedtime as needed. 02/14/22 05/15/22  Myna Hidalgo, DO  MELATONIN PO Take 10 mg by mouth daily at 6 (six) AM.    [provider]  montelukast (SINGULAIR) 10 MG tablet Take 1 tablet (10 mg total) by mouth at bedtime. 01/08/22   Gilmore Laroche, FNP  NIFEdipine (PROCARDIA-XL/NIFEDICAL-XL) 30 MG 24 hr tablet Take 1 tablet (30 mg total) by mouth daily. 12/14/21   Meriam Sprague, MD  Prenatal Vit-Fe Fumarate-FA (PRENATAL VITAMIN PO) Take by mouth.    [provider]  UNABLE TO FIND Med Name: maternal belly band/ support belt [redacted] weeks pregnant 01/08/22   Gilmore Laroche, FNP      Allergies    Shellfish allergy    Review of Systems   Review of Systems  Physical Exam Updated Vital Signs BP 114/86   Pulse (!) 113   Temp 97.8 F (36.6 C) (Oral)   Resp 18   Ht 5\' 7"  (1.702 m)   Wt (!) 163.3 kg   LMP 08/20/2021   SpO2 94%   BMI 56.38 kg/m  Physical Exam Vitals and  nursing note reviewed. Exam conducted with a chaperone present.  Constitutional:      Appearance: Normal appearance.  HENT:     Head: Normocephalic and atraumatic.     Nose: Congestion present.     Mouth/Throat:     Pharynx: Posterior oropharyngeal erythema present.     Comments: Uvula midline, normal phonation. Eyes:     General: No scleral icterus.       Right eye: No discharge.        Left eye: No discharge.     Extraocular Movements: Extraocular movements intact.     Pupils: Pupils are equal, round, and reactive to light.  Cardiovascular:     Rate and Rhythm: Regular rhythm. Tachycardia present.     Pulses: Normal pulses.     Heart sounds: Normal heart sounds. No  murmur heard.    No friction rub. No gallop.  Pulmonary:     Effort: Pulmonary effort is normal. No respiratory distress.     Breath sounds: Normal breath sounds.     Comments: Lungs are clear to auscultation bilaterally, no accessory muscle use or tachypnea.  Speaking in complete sentences. Abdominal:     General: Abdomen is flat. Bowel sounds are normal. There is no distension.     Palpations: Abdomen is soft.     Tenderness: There is no abdominal tenderness.     Comments: Gravid abdomen, soft and nontender.  Skin:    General: Skin is warm and dry.     Coloration: Skin is not jaundiced.  Neurological:     Mental Status: She is alert. Mental status is at baseline.     Coordination: Coordination normal.     ED Results / Procedures / Treatments   Labs (all labs ordered are listed, but only abnormal results are displayed) Labs Reviewed  RESP PANEL BY RT-PCR (FLU A&B, COVID) [id number]  GROUP A STREP BY PCR    EKG None  Radiology No results found.  Procedures Procedures    Medications Ordered in ED Medications - No data to display  ED Course/ Medical Decision Making/ A&P                           Medical Decision Making Amount and/or Complexity of Data Reviewed Labs: ordered.   Patient presents due to viral symptoms.  Differential includes but not limited to COVID, pneumonia, viral URI, UTI, pyelo-, pregnancy complication.  I reviewed external medical records as documented in HPI.  Comorbidities complicating care include current pregnancy, high risk due to obesity and usage of Procardia.  On exam patient is mildly tachycardic but per chart review this appears to be baseline.  Lungs are clear to auscultate bilaterally, her abdomen is soft and nontender, not having any fevers, abdominal pain, flank pain or dysuria or hematuria.  I do not think this is consistent with pyelonephritis and she is asymptomatic for UTI. Will recheck urine and obtain culture.  Patient is  COVID-positive, consider starting patient on Paxlovid but after discussion with pharmacy do not think the risk outweigh the benefits given she is on cardiac albuterol both of which interact with the antiviral medicine.  Additionally unclear benefit during current pregnancy, we engaged in shared decision making we will abstain from starting on that medicine at this time.  UA is notable for moderate leukocytes, given she is asymptomatic for UTI culture pending.  Will not restart patient on antibiotics at this time.  Patient will follow-up with gynecologist  Monday for reevaluation.  Suspect symptoms secondary to COVID.         Final Clinical Impression(s) / ED Diagnoses Final diagnoses:  None    Rx / DC Orders ED Discharge Orders     None         Theron Arista, PA-C 03/16/22 2319    Edwin Dada P, DO 03/22/22 1623

## 2022-03-19 LAB — URINE CULTURE: Culture: >=100000 — AB

## 2022-03-20 ENCOUNTER — Telehealth: Payer: Self-pay

## 2022-03-20 NOTE — Telephone Encounter (Signed)
Post ED Visit - Positive Culture Follow-up: Unsuccessful Patient Follow-up  Culture assessed and recommendations reviewed by:  [x]  , Pharm.D. []  Estill Batten, Pharm.D., BCPS AQ-ID []  , Pharm.D., BCPS []  Celedonio Miyamoto, Pharm.D., BCPS []  [place], Garvin Fila.D., BCPS, AAHIVP []  , Pharm.D., BCPS, AAHIVP []  Georgina Pillion, PharmD []  , PharmD, BCPS  Positive urine culture  [x]  Patient discharged without antimicrobial prescription and treatment is now indicated []  Organism is resistant to prescribed ED discharge antimicrobial []  Patient with positive blood cultures  Plan: Stop Macrobid and start Keflex 500 mg po Q 12 hours for 7 days per ED provider [place], MD  Unable to contact patient after 3 attempts, letter will be sent to address on file  [address] 03/20/2022, 2:50 PM

## 2022-03-20 NOTE — Progress Notes (Signed)
ED Antimicrobial Stewardship Positive Culture Follow Up   [name] [name] is an 36 y.o. female who presented to [facility] on 03/16/2022 with a chief complaint of  Chief Complaint  Patient presents with   Nasal Congestion    Recent Results (from the past 720 hour(s))  Resp Panel by RT-PCR (Flu A&B, Covid) Anterior Nasal Swab     Status: Abnormal   Collection Time: 03/16/22  1:07 PM   Specimen: Anterior Nasal Swab  Result Value Ref Range Status   SARS Coronavirus 2 by RT PCR POSITIVE (A) NEGATIVE Final    Comment: (NOTE) SARS-CoV-2 target nucleic acids are DETECTED.  The SARS-CoV-2 RNA is generally detectable in upper respiratory specimens during the acute phase of infection. Positive results are indicative of the presence of the identified virus, but do not rule out bacterial infection or co-infection with other pathogens not detected by the test. Clinical correlation with patient history and other diagnostic information is necessary to determine patient infection status. The expected result is Negative.  Fact Sheet for Patients: BloggerCourse.com  Fact Sheet for Healthcare Providers: SeriousBroker.it  This test is not yet approved or cleared by the [place] FDA and  has been authorized for detection and/or diagnosis of SARS-CoV-2 by FDA under an Emergency Use Authorization (EUA).  This EUA will remain in effect (meaning this test can be used) for the duration of  the COVID-19 declaration under Section 564(b)(1) of the A ct, 21 U.S.C. section 360bbb-3(b)(1), unless the authorization is terminated or revoked sooner.     Influenza A by PCR NEGATIVE NEGATIVE Final   Influenza B by PCR NEGATIVE NEGATIVE Final    Comment: (NOTE) The Xpert Xpress SARS-CoV-2/FLU/RSV plus assay is intended as an aid in the diagnosis of influenza from Nasopharyngeal swab specimens and should not be used as a sole basis for treatment. Nasal  washings and aspirates are unacceptable for Xpert Xpress SARS-CoV-2/FLU/RSV testing.  Fact Sheet for Patients: BloggerCourse.com  Fact Sheet for Healthcare Providers: SeriousBroker.it  This test is not yet approved or cleared by the [place] FDA and has been authorized for detection and/or diagnosis of SARS-CoV-2 by FDA under an Emergency Use Authorization (EUA). This EUA will remain in effect (meaning this test can be used) for the duration of the COVID-19 declaration under Section 564(b)(1) of the Act, 21 U.S.C. section 360bbb-3(b)(1), unless the authorization is terminated or revoked.  Performed at [facility], [address] [place], [place] [address]   Group A Strep by PCR     Status: None   Collection Time: 03/16/22  1:08 PM   Specimen: Anterior Nasal Swab; Sterile Swab  Result Value Ref Range Status   Group A Strep by PCR NOT DETECTED NOT DETECTED Final    Comment: Performed at [facility], [address] [place], [place] [address]  Urine Culture     Status: Abnormal   Collection Time: 03/16/22  2:47 PM   Specimen: Urine, Clean Catch  Result Value Ref Range Status   Specimen Description   Final    URINE, CLEAN CATCH Performed at [facility], [address] [place], [place] [address]    Special Requests   Final    NONE Performed at [facility], [address], [place], [place] [address]    Culture >=100,000 COLONIES/mL KLEBSIELLA PNEUMONIAE (A)  Final   Report Status 03/19/2022 FINAL  Final   Organism ID, Bacteria KLEBSIELLA PNEUMONIAE (A)  Final      Susceptibility   Klebsiella pneumoniae -  MIC*    AMPICILLIN RESISTANT Resistant     CEFAZOLIN <=4 SENSITIVE Sensitive     CEFEPIME <=0.12 SENSITIVE Sensitive     CEFTRIAXONE <=0.25 SENSITIVE Sensitive     CIPROFLOXACIN <=0.25 SENSITIVE Sensitive     GENTAMICIN <=1 SENSITIVE Sensitive     IMIPENEM <=0.25 SENSITIVE Sensitive     NITROFURANTOIN 64 INTERMEDIATE  Intermediate     TRIMETH/SULFA <=20 SENSITIVE Sensitive     AMPICILLIN/SULBACTAM <=2 SENSITIVE Sensitive     PIP/TAZO <=4 SENSITIVE Sensitive     * >=100,000 COLONIES/mL KLEBSIELLA PNEUMONIAE    [x]  Treated with Macrobid, organism intermediate to prescribed antimicrobial  New antibiotic prescription: Keflex 500mg  BID x 7 days  ED Provider: , MD    03/20/2022, 10:18 AM Clinical Pharmacist Monday - Friday phone -  [phone] Saturday - Sunday phone - [phone]

## 2022-03-21 ENCOUNTER — Emergency Department (HOSPITAL_COMMUNITY)
Admission: EM | Admit: 2022-03-21 | Discharge: 2022-03-21 | Disposition: A | Discharge status: Home / Self Care | Payer: Medicaid Other | Attending: Emergency Medicine | Admitting: Emergency Medicine

## 2022-03-21 ENCOUNTER — Encounter: Payer: Medicaid Other | Admitting: Clinical

## 2022-03-21 ENCOUNTER — Encounter (HOSPITAL_COMMUNITY): Payer: Self-pay | Admitting: Emergency Medicine

## 2022-03-21 DIAGNOSIS — Z7982 Long term (current) use of aspirin: Secondary | ICD-10-CM | POA: Insufficient documentation

## 2022-03-21 DIAGNOSIS — R059 Cough, unspecified: Secondary | ICD-10-CM | POA: Diagnosis present

## 2022-03-21 DIAGNOSIS — R051 Acute cough: Secondary | ICD-10-CM | POA: Diagnosis not present

## 2022-03-21 DIAGNOSIS — Z8616 Personal history of COVID-19: Secondary | ICD-10-CM | POA: Insufficient documentation

## 2022-03-21 DIAGNOSIS — R000 Tachycardia, unspecified: Secondary | ICD-10-CM | POA: Diagnosis not present

## 2022-03-21 MED ORDER — DEXTROMETHORPHAN HBR 15 MG/5ML PO SYRP
10.0000 mL | ORAL_SOLUTION | Freq: Four times a day (QID) | ORAL | 0 refills | Status: DC | PRN
Start: 2022-03-21 — End: 2022-04-01

## 2022-03-21 NOTE — ED Provider Notes (Signed)
[facility] EMERGENCY DEPARTMENT Provider Note   CSN: [id number] Arrival date & time: 03/21/22  4696     History  Chief Complaint  Patient presents with   Cough    [name] [name] is a 36 y.o. female.  36 year old female who presents the ER today with cough.  States that she was diagnosed with COVID a few days ago but she still coughing and she felt the baby moving more than normal this morning so she is worried that it was affecting the baby.  No fevers.  Tolerating p.o.  Is a bit short of breath at work with ambulation.  No other associated symptoms.  Has tried some over-the-counter medication at home which did not help much. No LE swelling.    Cough      Home Medications Prior to Admission medications   Medication Sig Start Date End Date Taking? Authorizing Provider  dextromethorphan 15 MG/5ML syrup Take 10 mLs (30 mg total) by mouth 4 (four) times daily as needed for cough. 03/21/22  Yes Crystelle Ferrufino, Barbara Cower, MD  acetaminophen (TYLENOL) 500 MG tablet Take 1,000 mg by mouth daily.    [provider]  albuterol (VENTOLIN HFA) 108 (90 Base) MCG/ACT inhaler Inhale 2 puffs into the lungs every 6 (six) hours as needed for wheezing or shortness of breath. 01/08/22   Gilmore Laroche, FNP  aspirin EC 81 MG tablet Take 1 tablet (81 mg total) by mouth daily. Swallow whole. 11/14/21   Marny Lowenstein, PA-C  Blood Pressure Monitor MISC For regular home bp monitoring during pregnancy Needs large cuff 11/14/21   Marny Lowenstein, PA-C  cetirizine (ZYRTEC) 10 MG tablet Take 1 tablet (10 mg total) by mouth daily. 01/08/22   Gilmore Laroche, FNP  Doxylamine-Pyridoxine (DICLEGIS) 10-10 MG TBEC 2 tabs q hs, if sx persist add 1 tab q am on day 3, if sx persist add 1 tab q afternoon on day 4 10/22/21   Cheral Marker, CNM  fluticasone (FLONASE) 50 MCG/ACT nasal spray Place 1 spray into both nostrils daily. 12/31/21   Valentino Nose, NP  FOLIC ACID PO Take by mouth.    [provider]   hydrOXYzine (VISTARIL) 25 MG capsule Take 1 capsule (25 mg total) by mouth at bedtime as needed. 02/14/22 05/15/22  Myna Hidalgo, DO  MELATONIN PO Take 10 mg by mouth daily at 6 (six) AM.    [provider]  montelukast (SINGULAIR) 10 MG tablet Take 1 tablet (10 mg total) by mouth at bedtime. 01/08/22   Gilmore Laroche, FNP  NIFEdipine (PROCARDIA-XL/NIFEDICAL-XL) 30 MG 24 hr tablet Take 1 tablet (30 mg total) by mouth daily. 12/14/21   Meriam Sprague, MD  Prenatal Vit-Fe Fumarate-FA (PRENATAL VITAMIN PO) Take by mouth.    [provider]  UNABLE TO FIND Med Name: maternal belly band/ support belt [redacted] weeks pregnant 01/08/22   Gilmore Laroche, FNP      Allergies    Shellfish allergy    Review of Systems   Review of Systems  Respiratory:  Positive for cough.     Physical Exam Updated Vital Signs BP 124/88   Pulse 100   Temp 98.1 F (36.7 C)   Resp 16   Ht 5\' 7"  (1.702 m)   Wt (!) 163.3 kg   LMP 08/20/2021   SpO2 99%   BMI 56.38 kg/m  Physical Exam Vitals and nursing note reviewed.  Constitutional:      Appearance: She is well-developed.  HENT:  Head: Normocephalic and atraumatic.     Mouth/Throat:     Mouth: Mucous membranes are moist.     Pharynx: Oropharynx is clear.  Eyes:     Pupils: Pupils are equal, round, and reactive to light.  Cardiovascular:     Rate and Rhythm: Regular rhythm. Tachycardia present.  Pulmonary:     Effort: No respiratory distress.     Breath sounds: No stridor. No wheezing or rales.  Abdominal:     General: There is no distension.  Musculoskeletal:     Cervical back: Normal range of motion.  Neurological:     Mental Status: She is alert.     ED Results / Procedures / Treatments   Labs (all labs ordered are listed, but only abnormal results are displayed) Labs Reviewed - No data to display  EKG None  Radiology No results found.  Procedures Procedures    Medications Ordered in ED Medications - No  data to display  ED Course/ Medical Decision Making/ A&P                           Medical Decision Making Risk OTC drugs.   Likely persistent viral cough from COVID, will try dextromethorphan at home. Doubt PE without hypoxia, chest pain, BP and lung exam are reassuring doubt pulmonary edema. HR slightly elevated but is in her 8th month of pregnancy, likely normal for her at this oint.   Final Clinical Impression(s) / ED Diagnoses Final diagnoses:  Acute cough    Rx / DC Orders ED Discharge Orders          Ordered    dextromethorphan 15 MG/5ML syrup  4 times daily PRN        03/21/22 0556              Arafat Cocuzza, Barbara Cower, MD 03/21/22 [id number]

## 2022-03-21 NOTE — ED Triage Notes (Signed)
Pt c/o continued cough and shortness of breath. Dx 8/26 with COVID. Pt states she went back to work yesterday and had a hard time catching her breath.   Pt currently 7 1/2 months pregnant.

## 2022-04-01 ENCOUNTER — Ambulatory Visit (INDEPENDENT_AMBULATORY_CARE_PROVIDER_SITE_OTHER): Payer: Medicaid Other | Admitting: *Deleted

## 2022-04-01 VITALS — BP 124/81 | HR 107 | Wt 355.4 lb

## 2022-04-01 DIAGNOSIS — Z3A32 32 weeks gestation of pregnancy: Secondary | ICD-10-CM

## 2022-04-01 DIAGNOSIS — O133 Gestational [pregnancy-induced] hypertension without significant proteinuria, third trimester: Secondary | ICD-10-CM

## 2022-04-01 DIAGNOSIS — Z6791 Unspecified blood type, Rh negative: Secondary | ICD-10-CM

## 2022-04-01 DIAGNOSIS — O99210 Obesity complicating pregnancy, unspecified trimester: Secondary | ICD-10-CM

## 2022-04-01 DIAGNOSIS — Z8759 Personal history of other complications of pregnancy, childbirth and the puerperium: Secondary | ICD-10-CM

## 2022-04-01 DIAGNOSIS — O0993 Supervision of high risk pregnancy, unspecified, third trimester: Secondary | ICD-10-CM | POA: Diagnosis not present

## 2022-04-01 NOTE — Progress Notes (Signed)
   NURSE VISIT- NST  SUBJECTIVE:  [name] [name] is a 36 y.o. [phone] female at [redacted]w[redacted]d, here for a NST for pregnancy complicated by [facility].  She reports active fetal movement, contractions: none, vaginal bleeding: none, membranes: intact.   OBJECTIVE:  BP 124/81   Pulse (!) 107   Wt (!) 355 lb 6.4 oz (161.2 kg)   LMP 08/20/2021   BMI 55.66 kg/m   Appears well, no apparent distress  No results found for this or any previous visit (from the past 24 hour(s)).  NST: FHR baseline 125 bpm, Variability: moderate, Accelerations:present, Decelerations:  Absent= Cat 1/reactive Toco: none   ASSESSMENT: [id number] at [redacted]w[redacted]d with GHTN NST reactive  PLAN: EFM strip reviewed by Joellyn Haff, CNM, [facility]   Recommendations: keep next appointment as scheduled    Jobe Marker  04/01/2022 11:31 AM

## 2022-04-03 ENCOUNTER — Other Ambulatory Visit: Payer: Self-pay | Admitting: Obstetrics & Gynecology

## 2022-04-03 DIAGNOSIS — O133 Gestational [pregnancy-induced] hypertension without significant proteinuria, third trimester: Secondary | ICD-10-CM

## 2022-04-03 NOTE — Progress Notes (Deleted)
Cardio-Obstetrics Clinic  New Evaluation  Date:  04/03/2022   ID:  [name] [name], DOB September 23, 1985, MRN [id number]  PCP:  Gilmore Laroche, FNP   [facility] Providers Cardiologist:  Prentice Docker, MD (Inactive)  Electrophysiologist:  None       Referring MD: Gilmore Laroche, FNP   Chief Complaint: palpitations  History of Present Illness:    [name] [name] is a 36 y.o. female [G5P1122] who is being seen today for the evaluation of palpitations at the request of Gilmore Laroche, FNP.   Patient has a history of bipolar disorder and morbid obesity. She is currently [redacted] weeks pregnant.   Saw Dr. Purvis Sheffield in 2020 for atypical chest pain. TTE showed LVEF 50-55%, normal RV, normal PASP, no significant valve disease. Symptoms were thought to be related to anxiety.  Was initially seen in Cardio-OB clinic on 12/14/21 for significant SOB. TTE was ordered but not completed. Zio monitor was only 1 day and showed average HR 101, no ectopy. Patient triggered events correlated with sinus tach.   Was seen in video visit on 12/2021 where she continued to have SOB on exertion but palpitations had improved and HR were running in the 90s.   Today, ***   Prior CV Studies Reviewed: The following studies were reviewed today: Zio Monitor 02/21/22:   Patch wear time was 1 day and 6 hours   Predominant rhythm was sinus with average HR 101   No ectopy detected   Patient triggered events correlate with sinus tachycardia   No sustained arrhythmias or significant pauses     Patch Wear Time:  1 days and 6 hours (2023-06-01T15:36:18-0400 to 2023-06-02T21:50:56-0400)   Patient had a min HR of 55 bpm, max HR of 145 bpm, and avg HR of 101 bpm. Predominant underlying rhythm was Sinus Rhythm. No Isolated SVEs, SVE Couplets, or SVE Triplets were present. No Isolated VEs, VE Couplets, or VE Triplets were present.   TTE 11/2018: IMPRESSIONS     1. The left ventricle has low normal systolic  function, with an ejection  fraction of 50-55%. Image quality not adequate to assess focal wall  motion. The cavity size was normal. Left ventricular diastolic parameters  were normal.   2. The right ventricle has normal systolic function. The cavity was  normal. There is no increase in right ventricular wall thickness. Right  ventricular systolic pressure normal with an estimated pressure of 23.2  mmHg.   3. The mitral valve is grossly normal.   4. The tricuspid valve is grossly normal.   5. The aortic valve is tricuspid.   6. The aortic root is normal in size and structure.   Past Medical History:  Diagnosis Date   Abnormal Pap smear    Anxiety    Asthma    Bipolar 1 disorder (HCC)    Depression    GERD (gastroesophageal reflux disease)    IUD (intrauterine device) in place 04/12/2013   Had IUD inserted 03/29/13 could not feel strings went to ER 9/18 and KUB saw IUD still could not see string, can't see now but seen in US   Kidney infection    Morbid obesity (HCC)    Pregnancy    UTI (urinary tract infection)     Past Surgical History:  Procedure Laterality Date   CESAREAN SECTION     HYSTEROSCOPY N/A 01/09/2021   Procedure: HYSTEROSCOPY;  Surgeon: Myna Hidalgo, DO;  Location: [facility];  Service: Gynecology;  Laterality: N/A;   IUD REMOVAL N/A  01/09/2021   Procedure: INTRAUTERINE DEVICE (IUD) REMOVAL;  Surgeon: Myna Hidalgo, DO;  Location: [facility];  Service: Gynecology;  Laterality: N/A;   TOOTH EXTRACTION        OB History     Gravida  5   Para  2   Term  1   Preterm  1   AB  2   Living  2      SAB  2   IAB  0   Ectopic  0   Multiple  0   Live Births  2               Current Medications: No outpatient medications have been marked as taking for the 04/05/22 encounter (Appointment) with Meriam Sprague, MD.     Allergies:   Shellfish allergy   Social History   Socioeconomic History   Marital status: Single    Spouse name: Not on file    Number of children: 2   Years of education: Not on file   Highest education level: Not on file  Occupational History   Not on file  Tobacco Use   Smoking status: Former    Types: Cigarettes    Quit date: 05/21/2007    Years since quitting: 14.8   Smokeless tobacco: Never  Vaping Use   Vaping Use: Never used  Substance and Sexual Activity   Alcohol use: No   Drug use: Not Currently    Types: Marijuana    Comment: 3 times a day   Sexual activity: Yes    Partners: Male    Birth control/protection: None  Other Topics Concern   Not on file  Social History Narrative   Not on file   Social Determinants of Health   Financial Resource Strain: Low Risk  (02/28/2022)   Overall Financial Resource Strain (CARDIA)    Difficulty of Paying Living Expenses: Not hard at all  Food Insecurity: No Food Insecurity (02/28/2022)   Hunger Vital Sign    Worried About Running Out of Food in the Last Year: Never true    Ran Out of Food in the Last Year: Never true  Transportation Needs: No Transportation Needs (02/28/2022)   PRAPARE - Administrator, Civil Service (Medical): No    Lack of Transportation (Non-Medical): No  Physical Activity: Sufficiently Active (02/28/2022)   Exercise Vital Sign    Days of Exercise per Week: 3 days    Minutes of Exercise per Session: 60 min  Stress: Stress Concern Present (02/28/2022)   [employer] of Occupational Health - Occupational Stress Questionnaire    Feeling of Stress : Rather much  Social Connections: Socially Isolated (02/28/2022)   Social Connection and Isolation Panel [NHANES]    Frequency of Communication with Friends and Family: Twice a week    Frequency of Social Gatherings with Friends and Family: Once a week    Attends Religious Services: Never    Database administrator or Organizations: No    Attends Engineer, structural: Never    Marital Status: Divorced      Family History  Problem Relation Age of Onset    Hypertension Father    Diabetes Father    Congestive Heart Failure Father    Diabetes Paternal Aunt    Diabetes Paternal Uncle    Hypertension Paternal Grandmother    Diabetes Paternal Grandmother    Diabetes Paternal Grandfather       ROS:   Please see the history  of present illness.     All other systems reviewed and are negative.   Labs/EKG Reviewed:    EKG:   EKG is  ordered today.  The ekg ordered today demonstrates sinus tachycardia with HR 104  Recent Labs: 12/27/2021: ALT 60; BUN 11; Creatinine, Ser 0.70; Potassium 3.4; Sodium 138 02/28/2022: Hemoglobin 12.4; Platelets 213   Recent Lipid Panel No results found for: "CHOL", "TRIG", "HDL", "CHOLHDL", "LDLCALC", "LDLDIRECT"  Physical Exam:    VS:  LMP 08/20/2021     Wt Readings from Last 3 Encounters:  04/01/22 (!) 355 lb 6.4 oz (161.2 kg)  03/21/22 (!) 360 lb (163.3 kg)  03/16/22 (!) 360 lb (163.3 kg)     GEN:  Well nourished, well developed in no acute distress HEENT: Normal NECK: No JVD; No carotid bruits CARDIAC: RRR, no murmurs, rubs, gallops RESPIRATORY:  Clear to auscultation without rales, wheezing or rhonchi  ABDOMEN: Obese, soft MUSCULOSKELETAL:  No edema; No deformity  SKIN: Warm and dry NEUROLOGIC:  Alert and oriented x 3 PSYCHIATRIC:  Normal affect    ASSESSMENT & PLAN:    #Palpitations: Overall improved. No chest pain, lightheadedness or syncope. Zio with only 1 day worth of data but shows no significant arrhythmias. Will continue to monitor. -Zio reassuring and overall symptoms improved -Increase hydration -Small, frequent meals   #Dyspnea on Exertion: Has been ongoing since the start of her pregnancy. Has been persistent. May be multifactorial in the setting of hormonal shifts in pregnancy, obesity and possible asthma. Zio monitor unremarkable. Never scheduled TTE. Overall, symptoms improved.    #Gestational HTN: Much better controlled and at goal of <140/90.  -Continue nifedipine 30mg   daily   #High Risk Pregnancy: #Morbid Obesity: Has discussed with OBGYN. Currently on ASA 81mg  daily. Will discuss CV risk reduction going forward.   There are no Patient Instructions on file for this visit.   Dispo:  No follow-ups on file.   Medication Adjustments/Labs and Tests Ordered: Current medicines are reviewed at length with the patient today.  Concerns regarding medicines are outlined above.  Tests Ordered: No orders of the defined types were placed in this encounter.  Medication Changes: No orders of the defined types were placed in this encounter.

## 2022-04-04 ENCOUNTER — Encounter: Payer: Self-pay | Admitting: Obstetrics & Gynecology

## 2022-04-04 ENCOUNTER — Ambulatory Visit (INDEPENDENT_AMBULATORY_CARE_PROVIDER_SITE_OTHER): Payer: Medicaid Other | Admitting: Obstetrics & Gynecology

## 2022-04-04 ENCOUNTER — Ambulatory Visit (INDEPENDENT_AMBULATORY_CARE_PROVIDER_SITE_OTHER): Payer: Medicaid Other

## 2022-04-04 VITALS — BP 139/95 | HR 100 | Wt 354.0 lb

## 2022-04-04 DIAGNOSIS — O133 Gestational [pregnancy-induced] hypertension without significant proteinuria, third trimester: Secondary | ICD-10-CM | POA: Diagnosis not present

## 2022-04-04 DIAGNOSIS — G4709 Other insomnia: Secondary | ICD-10-CM

## 2022-04-04 DIAGNOSIS — Z3A32 32 weeks gestation of pregnancy: Secondary | ICD-10-CM

## 2022-04-04 DIAGNOSIS — O99210 Obesity complicating pregnancy, unspecified trimester: Secondary | ICD-10-CM

## 2022-04-04 DIAGNOSIS — O0993 Supervision of high risk pregnancy, unspecified, third trimester: Secondary | ICD-10-CM

## 2022-04-04 DIAGNOSIS — R0683 Snoring: Secondary | ICD-10-CM

## 2022-04-04 DIAGNOSIS — Z8759 Personal history of other complications of pregnancy, childbirth and the puerperium: Secondary | ICD-10-CM

## 2022-04-04 DIAGNOSIS — Z6791 Unspecified blood type, Rh negative: Secondary | ICD-10-CM

## 2022-04-04 DIAGNOSIS — Z98891 History of uterine scar from previous surgery: Secondary | ICD-10-CM

## 2022-04-04 DIAGNOSIS — O2343 Unspecified infection of urinary tract in pregnancy, third trimester: Secondary | ICD-10-CM

## 2022-04-04 MED ORDER — NITROFURANTOIN MONOHYD MACRO 100 MG PO CAPS: 100.0000 mg | ORAL_CAPSULE | Freq: Every day | ORAL | 6 refills | Status: AC

## 2022-04-04 NOTE — Progress Notes (Signed)
[place] 32+3 wks,cephalic,BPP 8/8,FHR 146 bpm,anterior placenta gr 0,RI .67,.64,.63,.67=70%,AFI 14.6 cm,limited view

## 2022-04-04 NOTE — Progress Notes (Signed)
HIGH-RISK PREGNANCY VISIT Patient name: [name] [name] MRN [id number]  Date of birth: [birth year] Chief Complaint:   Routine Prenatal Visit  History of Present Illness:   GLEN BLATCHLEY is a 36 y.o. [id number] female at 55w3dwith an Estimated Date of Delivery: 05/27/22 being seen today for ongoing management of a high-risk pregnancy complicated by:  Gestational HTN- on procardia XL 320mdaily BPs at home per pt 140/120s Denies headache or blurry vision.  Notes dizziness Morbid obesity TOLAC H/o pyelo and recurrent UTIs .    Today she reports difficulty sleeping currently taking 3 unisom, hydroxyzine and melatonin in order to go to sleep. Notes issues both going to sleep and staying asleep.  Contractions: Irritability. Vag. Bleeding: None.  Movement: Present. denies leaking of fluid.      02/28/2022    8:52 AM 02/01/2022   11:33 AM 01/08/2022    8:17 AM 11/14/2021   11:05 AM 12/26/2020   10:38 AM  Depression screen PHQ 2/9  Decreased Interest 2 3 0 1 1  Down, Depressed, Hopeless _0 PHQ - 2 Score _1 Altered sleeping _2 Tired, decreased energy _3 Change in appetite 1 2  0 0  Feeling bad or failure about yourself  3 3  0 0  Trouble concentrating 1 3  0 0  Moving slowly or fidgety/restless 0 0  0 0  Suicidal thoughts 0 0  0 0  PHQ-9 Score _4 Difficult doing work/chores  Extremely dIfficult        Current Outpatient Medications  Medication Instructions   acetaminophen (TYLENOL) 1,000 mg, Oral, Daily   albuterol (VENTOLIN HFA) 108 (90 Base) MCG/ACT inhaler 2 puffs, Inhalation, Every 6 hours PRN   aspirin EC 81 mg, Oral, Daily, Swallow whole.   Blood Pressure Monitor MISC For regular home bp monitoring during pregnancy<BR>Needs large cuff   cetirizine (ZYRTEC) 10 mg, Oral, Daily   Doxylamine Succinate, Sleep, (UNISOM PO) Oral, Takes 2 at night   Doxylamine-Pyridoxine (DICLEGIS) 10-10 MG TBEC 2 tabs q hs, if sx persist add 1 tab q am on  day 3, if sx persist add 1 tab q afternoon on day 4   fluticasone (FLONASE) 50 MCG/ACT nasal spray 1 spray, Each Nare, Daily   FOLIC ACID PO Oral   hydrOXYzine (VISTARIL) 25 mg, Oral, At bedtime PRN   MELATONIN PO 50 mg, Oral, Daily   montelukast (SINGULAIR) 10 mg, Oral, Daily at bedtime   NIFEdipine (PROCARDIA-XL/NIFEDICAL-XL) 30 mg, Oral, Daily   nitrofurantoin (macrocrystal-monohydrate) (MACROBID) 100 mg, Oral, Daily   Prenatal Vit-Fe Fumarate-FA (PRENATAL VITAMIN PO) Oral   UNABLE TO FIND Med Name: maternal belly band/ support belt<BR>[redacted] weeks pregnant     Review of Systems:   Pertinent items are noted in HPI Denies abnormal vaginal discharge w/ itching/odor/irritation, headaches, visual changes, shortness of breath, chest pain, abdominal pain, severe nausea/vomiting, or problems with urination or bowel movements unless otherwise stated above. Pertinent History Reviewed:  Reviewed past medical,surgical, social, obstetrical and family history.  Reviewed problem list, medications and allergies. Physical Assessment:   Vitals:   04/04/22 1206 04/04/22 1210  BP: (!) 144/93 (!) 139/95  Pulse: 94 100  Weight: (!) 354 lb (160.6 kg)   Body mass index is 55.44 kg/m.           Physical Examination:   General appearance: alert, well appearing,  and in no distress  Mental status: normal mood, behavior, speech, dress, motor activity, and thought processes  Skin: warm & dry   Extremities: Edema: Trace    Cardiovascular: normal heart rate noted  Respiratory: normal respiratory effort, no distress  Abdomen: gravid, soft, non-tender  Pelvic: Cervical exam deferred         Fetal Status:     Movement: Present    Fetal Surveillance Testing today: cephalic,BPP 4/6,FKC 127 bpm,anterior placenta gr 0,RI .67,.64,.63,.67=70%,AFI 14.6 cm,limited view    Chaperone: N/A    No results found for this or any previous visit (from the past 24 hour(s)).   Assessment & Plan:  High-risk pregnancy:  [id number] at 18w3dwith an Estimated Date of Delivery: 05/27/22   1) Gestational HTN -1+ protein, previously trace- plan for lab work with PC ratio today -plan to repeat BP check tomorrow with home cuff- if elevated, plan to increased to 625mdaily -continue twice weekly testing -reviewed early IOL due to gHTN and potential for superimposed preeclampsia  2) Recurrent UTIs -UA and culture today -encouraged prophylactic treatment, pt agreeable, Rx sent in  3) O neg, s/p RhoGAM 4) TOLAC 5) morbid obesity 6) Insomnia -sleep study ordered, concern for sleep apnea  Meds:  Meds ordered this encounter  Medications   nitrofurantoin, macrocrystal-monohydrate, (MACROBID) 100 MG capsule    Sig: Take 1 capsule (100 mg total) by mouth daily.    Dispense:  30 capsule    Refill:  6    Labs/procedures today: CMP, PC ratio today, BPP 8/8  Treatment Plan:  as outlined above  Reviewed: Preterm labor symptoms and general obstetric precautions including but not limited to vaginal bleeding, contractions, leaking of fluid and fetal movement were reviewed in detail with the patient.  All questions were answered. Pt has home bp cuff. Check bp weekly, let usKoreanow if >140/90.   Follow-up: Return for BP CHECK TOMORROW, twice weekly as schedule, HROB visit weekly.   Future Appointments  Date Time Provider [place]9/15/2023 10:50 AM [facility]-FTOBGYN NURSE [facility] FTOBGYN  04/05/2022  2:00 PM PeFreada BergeronMD [facility] None  04/08/2022  9:50 AM [facility]-FTOBGYN NURSE [facility] FTOBGYN  04/10/2022  8:00 AM ZaAlvira MondayFNRosebushPC-RPC RPC  04/11/2022  8:00 AM StJacquelynn CreeMD BH-BHRA None  04/11/2022 10:45 AM CWStickney FTOBGYN [place]WH-FTIMG None  04/11/2022 11:50 AM EuFlorian BuffMD [facility]-FT FTOBGYN  04/15/2022  9:30 AM [facility]FTOBGYN NURSE [facility] FTO[facility]YN  04/18/2022 10:45 AM C[facility] - FTOBGYN USKoreaWH-FTIMG None  04/18/2022 11:50 AM EuFlorian BuffMD [facility]-FT FTOBGYN  04/22/2022  9:50 AM [facility]FTOBGYN NURSE [facility] FTOBGYN   04/25/2022 10:00 AM C[facility] - FTOBGYN USKoreaWH-FTIMG None  04/25/2022 11:10 AM OzJanyth PupaDO [facility]-FT FTOBGYN  04/29/2022  9:50 AM [facility]FTOBGYN NURSE [facility] FTO[facility]YN  05/02/2022  9:15 AM C[facility] - FTOBGYN US[place]WH-FTIMG None  05/02/2022 10:10 AM OzJanyth PupaDO [facility]-FT FTOBGYN  05/06/2022  9:30 AM [facility]FTOBGYN NURSE [facility]TOBGYN  05/09/2022  9:15 AM C[facility] - FTOBGYN US[place]oreaWH-FTIMG None  05/09/2022 10:10 AM EuFlorian BuffMD [facility]-FT FTOBGYN  05/13/2022  9:30 AM [facility]-FTOBGYN NURSE [facility]-FT FTOBGYN  05/16/2022  8:30 AM C[facility] - FTOBGYN US[place]oreaWH-FTIMG None  05/16/2022  9:30 AM EuFlorian BuffMD [facility]FT FTOBGYN  05/20/2022  9:30 AM [facility]FTOBGYN NURSE [facility]TO[facility]  05/23/2022  9:15 AM C[facility] - FTOBGYN US[place]oreaWH-FTIMG None  05/23/2022 10:10 AM EuFlorian BuffMD [facility]FT FTOBGYN  05/27/2022  9:30 AM C[facility]-FTOBGYN NURSE  [facility]-FT FTOBGYN    Orders Placed This Encounter  Procedures   Urine Culture   Urinalysis   Protein / creatinine ratio, urine   Comp Met (CMET)   Ambulatory referral to Sleep Studies    Janyth Pupa, DO Attending [place], [place] for [employer], [place]

## 2022-04-05 ENCOUNTER — Ambulatory Visit (INDEPENDENT_AMBULATORY_CARE_PROVIDER_SITE_OTHER): Payer: Medicaid Other | Admitting: *Deleted

## 2022-04-05 ENCOUNTER — Ambulatory Visit: Payer: Medicaid Other | Admitting: Cardiology

## 2022-04-05 VITALS — BP 132/83 | HR 103

## 2022-04-05 DIAGNOSIS — Z3A32 32 weeks gestation of pregnancy: Secondary | ICD-10-CM

## 2022-04-05 DIAGNOSIS — Z0130 Encounter for examination of blood pressure without abnormal findings: Secondary | ICD-10-CM

## 2022-04-05 DIAGNOSIS — O0993 Supervision of high risk pregnancy, unspecified, third trimester: Secondary | ICD-10-CM

## 2022-04-05 LAB — COMPREHENSIVE METABOLIC PANEL
ALT: 37 IU/L — ABNORMAL HIGH (ref 0–32)
AST: 28 IU/L (ref 0–40)
Albumin/Globulin Ratio: 1.3 (ref 1.2–2.2)
Albumin: 3.4 g/dL — ABNORMAL LOW (ref 3.9–4.9)
Alkaline Phosphatase: 110 IU/L (ref 44–121)
BUN/Creatinine Ratio: 11 (ref 9–23)
BUN: 7 mg/dL (ref 6–20)
Bilirubin Total: 0.2 mg/dL (ref 0.0–1.2)
CO2: 21 mmol/L (ref 20–29)
Calcium: 9 mg/dL (ref 8.7–10.2)
Chloride: 104 mmol/L (ref 96–106)
Creatinine, Ser: 0.62 mg/dL (ref 0.57–1.00)
Globulin, Total: 2.6 g/dL (ref 1.5–4.5)
Glucose: 77 mg/dL (ref 70–99)
Potassium: 4.3 mmol/L (ref 3.5–5.2)
Sodium: 140 mmol/L (ref 134–144)
Total Protein: 6 g/dL (ref 6.0–8.5)
eGFR: 121 mL/min/{1.73_m2} (ref >59)

## 2022-04-05 LAB — URINALYSIS
Bilirubin, UA: NEGATIVE
Glucose, UA: NEGATIVE
Ketones, UA: NEGATIVE
Nitrite, UA: POSITIVE — AB
RBC, UA: NEGATIVE
Specific Gravity, UA: 1.018 (ref 1.005–1.030)
Urobilinogen, Ur: 1 mg/dL (ref 0.2–1.0)
pH, UA: 6.5 (ref 5.0–7.5)

## 2022-04-05 LAB — POCT URINALYSIS DIPSTICK OB
Blood, UA: NEGATIVE
Glucose, UA: NEGATIVE
Leukocytes, UA: NEGATIVE
Nitrite, UA: NEGATIVE

## 2022-04-05 LAB — PROTEIN / CREATININE RATIO, URINE
Creatinine, Urine: 121.5 mg/dL
Protein, Ur: 31.5 mg/dL
Protein/Creat Ratio: 259 mg/g{creat} — ABNORMAL HIGH (ref 0–200)

## 2022-04-05 NOTE — Progress Notes (Signed)
   NURSE VISIT- BLOOD PRESSURE CHECK  SUBJECTIVE:  [name] [name] is a 36 y.o. [phone] female here for BP check. She is [redacted]w[redacted]d pregnant    HYPERTENSION ROS:  Pregnant/postpartum:  Severe headaches that don't go away with tylenol/other medicines: No  Visual changes (seeing spots/double/blurred vision) No  Severe pain under right breast breast or in center of upper chest No  Severe nausea/vomiting No  Taking medicines as instructed no    OBJECTIVE:  BP 132/83   Pulse (!) 103   LMP 08/20/2021   Appearance alert, well appearing, and in no distress.  ASSESSMENT: Pregnancy [redacted]w[redacted]d  blood pressure check. Also checked with patients home cuff appears to be incorrect as home reading was 160/88.   PLAN: Discussed with Dr. Charlotta Newton   Recommendations: no changes needed   Follow-up: as scheduled   Annamarie Dawley  04/05/2022 10:24 AM

## 2022-04-06 ENCOUNTER — Telehealth (HOSPITAL_BASED_OUTPATIENT_CLINIC_OR_DEPARTMENT_OTHER): Payer: Self-pay

## 2022-04-06 NOTE — Telephone Encounter (Signed)
Post ED Visit - Positive Culture Follow-up: Successful Patient Follow-Up  Culture assessed and recommendations reviewed by:  [x]  Esmeralda Arthur, Pharm.D. []  Heide Guile, Pharm.D., BCPS AQ-ID []  Parks Neptune, Pharm.D., BCPS []  Alycia Rossetti, Pharm.D., BCPS []  Obert, Pharm.D., BCPS, AAHIVP []  Legrand Como, Pharm.D., BCPS, AAHIVP []  Salome Arnt, PharmD, BCPS []  Johnnette Gourd, PharmD, BCPS []  Hughes Better, PharmD, BCPS []  Leeroy Cha, PharmD  Positive urine culture  Pt returned call about urine culture  [x]  Patient discharged without antimicrobial prescription and treatment is now indicated []  Organism is resistant to prescribed ED discharge antimicrobial []  Patient with positive blood cultures  Changes discussed with ED provider: Davonna Belling, MD New antibiotic prescription Keflex 500 mg po Q 12H for 7 days.  Pt stated that her OBGYN has already prescribed antibiotics and she does not need anymore.   Contacted patient, date 04/05/22, time 6:00 pm   Glennon Hamilton 04/06/2022, 11:16 AM

## 2022-04-08 ENCOUNTER — Telehealth: Payer: Self-pay

## 2022-04-08 ENCOUNTER — Other Ambulatory Visit: Payer: Self-pay | Admitting: Obstetrics & Gynecology

## 2022-04-08 ENCOUNTER — Ambulatory Visit (INDEPENDENT_AMBULATORY_CARE_PROVIDER_SITE_OTHER): Payer: Medicaid Other | Admitting: *Deleted

## 2022-04-08 VITALS — BP 116/80 | HR 97 | Wt 358.0 lb

## 2022-04-08 DIAGNOSIS — Z331 Pregnant state, incidental: Secondary | ICD-10-CM

## 2022-04-08 DIAGNOSIS — O288 Other abnormal findings on antenatal screening of mother: Secondary | ICD-10-CM | POA: Diagnosis not present

## 2022-04-08 DIAGNOSIS — N390 Urinary tract infection, site not specified: Secondary | ICD-10-CM

## 2022-04-08 DIAGNOSIS — O0993 Supervision of high risk pregnancy, unspecified, third trimester: Secondary | ICD-10-CM

## 2022-04-08 DIAGNOSIS — O133 Gestational [pregnancy-induced] hypertension without significant proteinuria, third trimester: Secondary | ICD-10-CM

## 2022-04-08 DIAGNOSIS — Z1389 Encounter for screening for other disorder: Secondary | ICD-10-CM

## 2022-04-08 LAB — POCT URINALYSIS DIPSTICK OB
Blood, UA: NEGATIVE
Glucose, UA: NEGATIVE
Ketones, UA: NEGATIVE
Nitrite, UA: NEGATIVE

## 2022-04-08 LAB — URINE CULTURE

## 2022-04-08 MED ORDER — CEFADROXIL 500 MG PO CAPS: 500.0000 mg | ORAL_CAPSULE | Freq: Two times a day (BID) | ORAL | 0 refills | Status: AC

## 2022-04-08 NOTE — Telephone Encounter (Signed)
Patient called and stated that she needs the dates changed on her breast pump prescription, patient would like for someone to give her a call

## 2022-04-08 NOTE — Progress Notes (Signed)
Rx for [facility] sent in Instructions reviewed with patient Duricef x 10 days then Macrobid for suppression therapy  Janyth Pupa, DO Attending [place], [place] for [employer], [place]

## 2022-04-08 NOTE — Progress Notes (Addendum)
   NURSE VISIT- NST  SUBJECTIVE:  [name] [name] is a 36 y.o. [phone] female at [redacted]w[redacted]d, here for a NST for pregnancy complicated by [facility].  She reports active fetal movement, contractions: none, vaginal bleeding: none, membranes: intact.   OBJECTIVE:  BP 116/80   Pulse 97   Wt (!) 358 lb (162.4 kg)   LMP 08/20/2021   BMI 56.07 kg/m   Appears well, no apparent distress  Results for orders placed or performed in visit on 04/08/22 (from the past 24 hour(s))  POC Urinalysis Dipstick OB   Collection Time: 04/08/22 11:02 AM  Result Value Ref Range   Color, UA     Clarity, UA     Glucose, UA Negative Negative   Bilirubin, UA     Ketones, UA neg    Spec Grav, UA     Blood, UA neg    pH, UA     POC,PROTEIN,UA Trace Negative, Trace, Small (1+), Moderate (2+), Large (3+), 4+   Urobilinogen, UA     Nitrite, UA neg    Leukocytes, UA Small (1+) (A) Negative   Appearance     Odor      NST: FHR baseline 120 bpm, Variability: moderate, Accelerations:present, Decelerations:  Absent= Cat 1/reactive Toco: none   ASSESSMENT: [id number] at [redacted]w[redacted]d with GHTN NST reactive  PLAN: EFM strip reviewed by Nigel Berthold, CNM   Recommendations: keep next appointment as scheduled    Alice Rieger  04/08/2022 11:14 AM  Chart reviewed for nurse visit. Agree with plan of care.  Christin Fudge, CNM 04/08/2022 11:18 AM   Chart reviewed for nurse visit. Agree with plan of care.  Christin Fudge, [place] 04/08/2022 1:37 PM

## 2022-04-10 ENCOUNTER — Other Ambulatory Visit: Payer: Self-pay | Admitting: Obstetrics & Gynecology

## 2022-04-10 ENCOUNTER — Ambulatory Visit: Payer: Medicaid Other | Admitting: Family Medicine

## 2022-04-10 ENCOUNTER — Encounter: Payer: Self-pay | Admitting: Family Medicine

## 2022-04-10 DIAGNOSIS — O133 Gestational [pregnancy-induced] hypertension without significant proteinuria, third trimester: Secondary | ICD-10-CM

## 2022-04-11 ENCOUNTER — Encounter: Payer: Self-pay | Admitting: Obstetrics & Gynecology

## 2022-04-11 ENCOUNTER — Ambulatory Visit (INDEPENDENT_AMBULATORY_CARE_PROVIDER_SITE_OTHER): Payer: Medicaid Other

## 2022-04-11 ENCOUNTER — Ambulatory Visit (INDEPENDENT_AMBULATORY_CARE_PROVIDER_SITE_OTHER): Payer: Medicaid Other | Admitting: Obstetrics & Gynecology

## 2022-04-11 ENCOUNTER — Telehealth (HOSPITAL_COMMUNITY): Payer: Medicaid Other | Admitting: Psychiatry

## 2022-04-11 VITALS — BP 150/95 | HR 101 | Wt 358.0 lb

## 2022-04-11 DIAGNOSIS — O133 Gestational [pregnancy-induced] hypertension without significant proteinuria, third trimester: Secondary | ICD-10-CM

## 2022-04-11 DIAGNOSIS — O0993 Supervision of high risk pregnancy, unspecified, third trimester: Secondary | ICD-10-CM

## 2022-04-11 DIAGNOSIS — Z3A33 33 weeks gestation of pregnancy: Secondary | ICD-10-CM | POA: Diagnosis not present

## 2022-04-11 DIAGNOSIS — O99210 Obesity complicating pregnancy, unspecified trimester: Secondary | ICD-10-CM

## 2022-04-11 DIAGNOSIS — Z6791 Unspecified blood type, Rh negative: Secondary | ICD-10-CM

## 2022-04-11 DIAGNOSIS — Z8759 Personal history of other complications of pregnancy, childbirth and the puerperium: Secondary | ICD-10-CM

## 2022-04-11 MED ORDER — NIFEDIPINE ER OSMOTIC RELEASE 60 MG PO TB24: 60.0000 mg | ORAL_TABLET | Freq: Every day | ORAL | 11 refills | Status: DC

## 2022-04-11 NOTE — Progress Notes (Signed)
[place] 46+2 wks,cephalic,RI .86,.38,.17=71%,HAF 8/8,AFI 12.9 cm,EFW 2403 g 69%,AC 91%,FHR 138 bpm,anterior placenta gr 0

## 2022-04-11 NOTE — Progress Notes (Signed)
HIGH-RISK PREGNANCY VISIT Patient name: [name] [name] MRN [id number]  Date of birth: [birth year] Chief Complaint:   Routine Prenatal Visit  History of Present Illness:   [name] [name] is a 36 y.o. [id number] female at [redacted]w[redacted]d with an Estimated Date of Delivery: 05/27/22 being seen today for ongoing management of a high-risk pregnancy complicated by gestational hypertension currently on ^60 mg daily.    Today she reports no complaints. Contractions: Irritability. Vag. Bleeding: None.  Movement: Present. denies leaking of fluid.      02/28/2022    8:52 AM 02/01/2022   11:33 AM 01/08/2022    8:17 AM 11/14/2021   11:05 AM 12/26/2020   10:38 AM  Depression screen PHQ 2/9  Decreased Interest 2 3 0 1 1  Down, Depressed, Hopeless 2 3 1 2 1   PHQ - 2 Score 4 6 1 3 2   Altered sleeping 3 2  3 3   Tired, decreased energy 2 2  3 3   Change in appetite 1 2  0 0  Feeling bad or failure about yourself  3 3  0 0  Trouble concentrating 1 3  0 0  Moving slowly or fidgety/restless 0 0  0 0  Suicidal thoughts 0 0  0 0  PHQ-9 Score 14 18  9 8   Difficult doing work/chores  Extremely dIfficult           02/28/2022    8:52 AM 02/01/2022   11:35 AM 11/14/2021   11:05 AM 12/26/2020   10:38 AM  GAD 7 : Generalized Anxiety Score  Nervous, Anxious, on Edge 3 2 3  0  Control/stop worrying 3 3 0 0  Worry too much - different things 2 3 0 0  Trouble relaxing 3 3 0 0  Restless 3 0 0 0  Easily annoyed or irritable 3 3 0 0  Afraid - awful might happen 3 3 3  0  Total GAD 7 Score 20 17 6  0  Anxiety Difficulty  Very difficult       Review of Systems:   Pertinent items are noted in HPI Denies abnormal vaginal discharge w/ itching/odor/irritation, headaches, visual changes, shortness of breath, chest pain, abdominal pain, severe nausea/vomiting, or problems with urination or bowel movements unless otherwise stated above. Pertinent History Reviewed:  Reviewed past medical,surgical, social, obstetrical and family  history.  Reviewed problem list, medications and allergies. Physical Assessment:   Vitals:   04/11/22 1152  BP: (!) 150/95  Pulse: (!) 101  Weight: (!) 358 lb (162.4 kg)  Body mass index is 56.07 kg/m.           Physical Examination:   General appearance: alert, well appearing, and in no distress  Mental status: alert, oriented to person, place, and time  Skin: warm & dry   Extremities: Edema: Trace    Cardiovascular: normal heart rate noted  Respiratory: normal respiratory effort, no distress  Abdomen: gravid, soft, non-tender  Pelvic: Cervical exam deferred         Fetal Status:     Movement: Present    Fetal Surveillance Testing today: BPP 8/8 with Dopplers 85%   Chaperone: N/A    No results found for this or any previous visit (from the past 24 hour(s)).  Assessment & Plan:  High-risk pregnancy: [id number] at [redacted]w[redacted]d with an Estimated Date of Delivery: 05/27/22      ICD-10-CM   1. Supervision of high risk pregnancy in third trimester  O09.93     2. Gestational  hypertension, third trimester  O13.3    ^procardia xl 60 mg daily        Meds:  Meds ordered this encounter  Medications   NIFEdipine (PROCARDIA XL) 60 MG 24 hr tablet    Sig: Take 1 tablet (60 mg total) by mouth daily.    Dispense:  30 tablet    Refill:  11    Orders: No orders of the defined types were placed in this encounter.    Labs/procedures today: U/S  Treatment Plan:  twice weekly surveillance, increase to 60 mg daily   Follow-up: Return for keep scheduled.   Future Appointments  Date Time Provider [place]  04/15/2022  9:30 AM C[facility]-FTOBGYN NURSE C[facility]-FT FTOBGYN  04/17/2022  3:30 PM Freada Bergeron, MD [facility]  04/18/2022 10:45 AM C[facility] - FTOBGYN [place] [facility]-FT[facility]G None  04/18/2022 11:50 AM Florian Buff, MD [facility]-FT FTOBGYN  04/22/2022  9:50 AM [facility]-FTOBGYN NURSE [facility]  04/25/2022 10:00 AM C[facility] - FTOBGYN [place] [facility]-FT[facility] None  04/25/2022 11:10 AM Janyth Pupa, DO  [facility]-FT FTOBGYN  04/29/2022  9:50 AM [facility]-FTOBGYN NURSE [facility] FTO[facility]  05/02/2022  9:15 AM C[facility] - FTOBGYN [place] [facility] None  05/02/2022 10:10 AM Janyth Pupa, DO [facility] FTOBGYN  05/06/2022  9:30 AM [facility]-FTOBGYN NURSE [facility]TO[facility]  05/09/2022  9:15 AM C[facility] - FTOBGYN [place] [facility] None  05/09/2022 10:10 AM Florian Buff, MD [facility] FTO[facility]  05/13/2022  9:30 AM [facility]-FTOBGYN NURSE [facility]TO[facility]  05/16/2022  8:30 AM C[facility] - FTOBGYN [place] [facility] None  05/16/2022  9:30 AM Florian Buff, MD [facility]FT FTOBGYN  05/20/2022  9:30 AM [facility]-FTOBGYN NURSE [facility] FTOBGYN  05/23/2022  9:15 AM C[facility] - FTOBGYN [place] [facility]-FT[facility] None  05/23/2022 10:10 AM Florian Buff, MD [facility] FTO[facility]  05/27/2022  9:30 AM C[facility]-FTOBGYN NURSE C[facility]-FT FTOBGYN    No orders of the defined types were placed in this encounter.  Florian Buff  Attending Physician for the [facility] [place] [facility] 04/11/2022 12:19 PM

## 2022-04-12 ENCOUNTER — Telehealth: Payer: Self-pay | Admitting: Family Medicine

## 2022-04-12 NOTE — Telephone Encounter (Signed)
Unable to reach pt

## 2022-04-12 NOTE — Telephone Encounter (Signed)
Patient called in regard to labs,  Patient has labs with OBGYN office wants to know if still needs to get labs for this office   Wants a call back

## 2022-04-12 NOTE — Telephone Encounter (Signed)
"  Call cannot be completed at this time" message unable to speak to pt.

## 2022-04-14 NOTE — Progress Notes (Deleted)
Cardio-Obstetrics Clinic  New Evaluation  Date:  04/14/2022   ID:  [name] [name], DOB November 06, 1985, MRN [id number]  PCP:  Gilmore Laroche, FNP   [facility] Providers Cardiologist:  Prentice Docker, MD (Inactive)  Electrophysiologist:  None       Referring MD: Gilmore Laroche, FNP   Chief Complaint: palpitations  History of Present Illness:    [name] [name] is a 36 y.o. female [G5P1122] who is being seen today for the evaluation of palpitations at the request of Gilmore Laroche, FNP.   Patient has a history of bipolar disorder and morbid obesity. She is currently [redacted] weeks pregnant.   Saw Dr. Purvis Sheffield in 2020 for atypical chest pain. TTE showed LVEF 50-55%, normal RV, normal PASP, no significant valve disease. Symptoms were thought to be related to anxiety.  Was initially seen in Cardio-OB clinic on 12/14/21 for significant SOB. TTE was ordered but not completed. Zio monitor was only 1 day and showed average HR 101, no ectopy. Patient triggered events correlated with sinus tach.   Was seen in video visit on 12/2021 where she continued to have SOB on exertion but palpitations had improved and HR were running in the 90s.   Today, ***   Prior CV Studies Reviewed: The following studies were reviewed today: Zio Monitor 02/21/22:   Patch wear time was 1 day and 6 hours   Predominant rhythm was sinus with average HR 101   No ectopy detected   Patient triggered events correlate with sinus tachycardia   No sustained arrhythmias or significant pauses     Patch Wear Time:  1 days and 6 hours (2023-06-01T15:36:18-0400 to 2023-06-02T21:50:56-0400)   Patient had a min HR of 55 bpm, max HR of 145 bpm, and avg HR of 101 bpm. Predominant underlying rhythm was Sinus Rhythm. No Isolated SVEs, SVE Couplets, or SVE Triplets were present. No Isolated VEs, VE Couplets, or VE Triplets were present.   TTE 11/2018: IMPRESSIONS     1. The left ventricle has low normal systolic  function, with an ejection  fraction of 50-55%. Image quality not adequate to assess focal wall  motion. The cavity size was normal. Left ventricular diastolic parameters  were normal.   2. The right ventricle has normal systolic function. The cavity was  normal. There is no increase in right ventricular wall thickness. Right  ventricular systolic pressure normal with an estimated pressure of 23.2  mmHg.   3. The mitral valve is grossly normal.   4. The tricuspid valve is grossly normal.   5. The aortic valve is tricuspid.   6. The aortic root is normal in size and structure.   Past Medical History:  Diagnosis Date   Abnormal Pap smear    Anxiety    Asthma    Bipolar 1 disorder (HCC)    Depression    GERD (gastroesophageal reflux disease)    IUD (intrauterine device) in place 04/12/2013   Had IUD inserted 03/29/13 could not feel strings went to ER 9/18 and KUB saw IUD still could not see string, can't see now but seen in US   Kidney infection    Morbid obesity (HCC)    Pregnancy    UTI (urinary tract infection)     Past Surgical History:  Procedure Laterality Date   CESAREAN SECTION     HYSTEROSCOPY N/A 01/09/2021   Procedure: HYSTEROSCOPY;  Surgeon: Myna Hidalgo, DO;  Location: [facility];  Service: Gynecology;  Laterality: N/A;   IUD REMOVAL N/A  01/09/2021   Procedure: INTRAUTERINE DEVICE (IUD) REMOVAL;  Surgeon: Myna Hidalgo, DO;  Location: [facility];  Service: Gynecology;  Laterality: N/A;   TOOTH EXTRACTION        OB History     Gravida  5   Para  2   Term  1   Preterm  1   AB  2   Living  2      SAB  2   IAB  0   Ectopic  0   Multiple  0   Live Births  2               Current Medications: No outpatient medications have been marked as taking for the 04/17/22 encounter (Appointment) with Meriam Sprague, MD.     Allergies:   Shellfish allergy   Social History   Socioeconomic History   Marital status: Single    Spouse name: Not on file    Number of children: 2   Years of education: Not on file   Highest education level: Not on file  Occupational History   Not on file  Tobacco Use   Smoking status: Former    Types: Cigarettes    Quit date: 05/21/2007    Years since quitting: 14.9   Smokeless tobacco: Never  Vaping Use   Vaping Use: Never used  Substance and Sexual Activity   Alcohol use: No   Drug use: Not Currently    Types: Marijuana    Comment: 3 times a day   Sexual activity: Yes    Partners: Male    Birth control/protection: None  Other Topics Concern   Not on file  Social History Narrative   Not on file   Social Determinants of Health   Financial Resource Strain: Low Risk  (02/28/2022)   Overall Financial Resource Strain (CARDIA)    Difficulty of Paying Living Expenses: Not hard at all  Food Insecurity: No Food Insecurity (02/28/2022)   Hunger Vital Sign    Worried About Running Out of Food in the Last Year: Never true    Ran Out of Food in the Last Year: Never true  Transportation Needs: No Transportation Needs (02/28/2022)   PRAPARE - Administrator, Civil Service (Medical): No    Lack of Transportation (Non-Medical): No  Physical Activity: Sufficiently Active (02/28/2022)   Exercise Vital Sign    Days of Exercise per Week: 3 days    Minutes of Exercise per Session: 60 min  Stress: Stress Concern Present (02/28/2022)   [employer] of Occupational Health - Occupational Stress Questionnaire    Feeling of Stress : Rather much  Social Connections: Socially Isolated (02/28/2022)   Social Connection and Isolation Panel [NHANES]    Frequency of Communication with Friends and Family: Twice a week    Frequency of Social Gatherings with Friends and Family: Once a week    Attends Religious Services: Never    Database administrator or Organizations: No    Attends Engineer, structural: Never    Marital Status: Divorced      Family History  Problem Relation Age of Onset    Hypertension Father    Diabetes Father    Congestive Heart Failure Father    Diabetes Paternal Aunt    Diabetes Paternal Uncle    Hypertension Paternal Grandmother    Diabetes Paternal Grandmother    Diabetes Paternal Grandfather       ROS:   Please see the history  of present illness.     All other systems reviewed and are negative.   Labs/EKG Reviewed:    EKG:   EKG is  ordered today.  The ekg ordered today demonstrates sinus tachycardia with HR 104  Recent Labs: 02/28/2022: Hemoglobin 12.4; Platelets 213 04/04/2022: ALT 37; BUN 7; Creatinine, Ser 0.62; Potassium 4.3; Sodium 140   Recent Lipid Panel No results found for: "CHOL", "TRIG", "HDL", "CHOLHDL", "LDLCALC", "LDLDIRECT"  Physical Exam:    VS:  LMP 08/20/2021     Wt Readings from Last 3 Encounters:  04/11/22 (!) 358 lb (162.4 kg)  04/08/22 (!) 358 lb (162.4 kg)  04/04/22 (!) 354 lb (160.6 kg)     GEN:  Well nourished, well developed in no acute distress HEENT: Normal NECK: No JVD; No carotid bruits CARDIAC: RRR, no murmurs, rubs, gallops RESPIRATORY:  Clear to auscultation without rales, wheezing or rhonchi  ABDOMEN: Obese, soft MUSCULOSKELETAL:  No edema; No deformity  SKIN: Warm and dry NEUROLOGIC:  Alert and oriented x 3 PSYCHIATRIC:  Normal affect    ASSESSMENT & PLAN:    #Palpitations: Overall improved. No chest pain, lightheadedness or syncope. Zio with only 1 day worth of data but shows no significant arrhythmias. Will continue to monitor. -Zio reassuring and overall symptoms improved -Increase hydration -Small, frequent meals   #Dyspnea on Exertion: Has been ongoing since the start of her pregnancy. Has been persistent. May be multifactorial in the setting of hormonal shifts in pregnancy, obesity and possible asthma. Zio monitor unremarkable. Never scheduled TTE. Overall, symptoms improved.    #Gestational HTN: Much better controlled and at goal of <140/90.  -Continue nifedipine 30mg  daily    #High Risk Pregnancy: #Morbid Obesity: Has discussed with OBGYN. Currently on ASA 81mg  daily. Will discuss CV risk reduction going forward.   There are no Patient Instructions on file for this visit.   Dispo:  No follow-ups on file.   Medication Adjustments/Labs and Tests Ordered: Current medicines are reviewed at length with the patient today.  Concerns regarding medicines are outlined above.  Tests Ordered: No orders of the defined types were placed in this encounter.  Medication Changes: No orders of the defined types were placed in this encounter.

## 2022-04-15 ENCOUNTER — Ambulatory Visit (INDEPENDENT_AMBULATORY_CARE_PROVIDER_SITE_OTHER): Payer: Medicaid Other | Admitting: *Deleted

## 2022-04-15 ENCOUNTER — Other Ambulatory Visit (HOSPITAL_COMMUNITY)
Admission: RE | Admit: 2022-04-15 | Discharge: 2022-04-15 | Disposition: A | Discharge status: Home / Self Care | Payer: Medicaid Other | Source: Ambulatory Visit | Attending: Obstetrics & Gynecology | Admitting: Obstetrics & Gynecology

## 2022-04-15 VITALS — BP 130/85 | HR 113 | Wt 358.4 lb

## 2022-04-15 DIAGNOSIS — O0993 Supervision of high risk pregnancy, unspecified, third trimester: Secondary | ICD-10-CM | POA: Diagnosis not present

## 2022-04-15 DIAGNOSIS — N898 Other specified noninflammatory disorders of vagina: Secondary | ICD-10-CM | POA: Insufficient documentation

## 2022-04-15 DIAGNOSIS — O288 Other abnormal findings on antenatal screening of mother: Secondary | ICD-10-CM

## 2022-04-15 DIAGNOSIS — O133 Gestational [pregnancy-induced] hypertension without significant proteinuria, third trimester: Secondary | ICD-10-CM | POA: Diagnosis not present

## 2022-04-15 DIAGNOSIS — Z3A34 34 weeks gestation of pregnancy: Secondary | ICD-10-CM | POA: Diagnosis not present

## 2022-04-15 DIAGNOSIS — Z1389 Encounter for screening for other disorder: Secondary | ICD-10-CM

## 2022-04-15 DIAGNOSIS — Z331 Pregnant state, incidental: Secondary | ICD-10-CM

## 2022-04-15 LAB — POCT URINALYSIS DIPSTICK OB
Blood, UA: NEGATIVE
Glucose, UA: NEGATIVE
Ketones, UA: NEGATIVE
Nitrite, UA: NEGATIVE
POC,PROTEIN,UA: NEGATIVE

## 2022-04-15 NOTE — Progress Notes (Cosign Needed Addendum)
   NURSE VISIT- NST  SUBJECTIVE:  [name] [name] is a 36 y.o. [id number] female at [redacted]w[redacted]d, here for a NST for pregnancy complicated by [facility].  She reports active fetal movement, contractions: irritability, vaginal bleeding: none, membranes: intact. Complains of vaginal itching that has not resolved with Monistat.  Currently still on antibiotics for UTI  OBJECTIVE:  BP 130/85   Pulse (!) 113   Wt (!) 358 lb 6.4 oz (162.6 kg)   LMP 08/20/2021   BMI 56.13 kg/m   Appears well, no apparent distress  Results for orders placed or performed in visit on 04/15/22 (from the past 24 hour(s))  POC Urinalysis Dipstick OB   Collection Time: 04/15/22  9:55 AM  Result Value Ref Range   Color, UA     Clarity, UA     Glucose, UA Negative Negative   Bilirubin, UA     Ketones, UA neg    Spec Grav, UA     Blood, UA neg    pH, UA     POC,PROTEIN,UA Negative Negative, Trace, Small (1+), Moderate (2+), Large (3+), 4+   Urobilinogen, UA     Nitrite, UA neg    Leukocytes, UA Small (1+) (A) Negative   Appearance     Odor      NST: FHR baseline 115 bpm, Variability: moderate, Accelerations:present, Decelerations:  Absent= Cat 1/reactive Toco: none   ASSESSMENT: [id number] at [redacted]w[redacted]d with GHTN NST reactive  PLAN: EFM strip reviewed by Dr. Elonda Husky   Recommendations: keep next appointment as scheduled   CV swab sent to GC, CHL, BV and yeast  [name] [name]  04/15/2022 11:02 AM

## 2022-04-15 NOTE — Telephone Encounter (Signed)
Second attempt

## 2022-04-16 ENCOUNTER — Encounter: Payer: Self-pay | Admitting: Obstetrics & Gynecology

## 2022-04-16 ENCOUNTER — Other Ambulatory Visit: Payer: Self-pay | Admitting: Obstetrics & Gynecology

## 2022-04-16 ENCOUNTER — Telehealth: Payer: Self-pay | Admitting: Obstetrics & Gynecology

## 2022-04-16 DIAGNOSIS — O133 Gestational [pregnancy-induced] hypertension without significant proteinuria, third trimester: Secondary | ICD-10-CM

## 2022-04-16 LAB — CERVICOVAGINAL ANCILLARY ONLY
Bacterial Vaginitis (gardnerella): NEGATIVE
Candida Glabrata: NEGATIVE
Candida Vaginitis: POSITIVE — AB
Chlamydia: NEGATIVE
Comment: NEGATIVE
Comment: NEGATIVE
Comment: NEGATIVE
Comment: NEGATIVE
Comment: NEGATIVE
Comment: NORMAL
Neisseria Gonorrhea: NEGATIVE
Trichomonas: NEGATIVE

## 2022-04-16 NOTE — Telephone Encounter (Signed)
Unable to get in touch with pt.    

## 2022-04-16 NOTE — Telephone Encounter (Signed)
LMOVM returning patient's call.  

## 2022-04-16 NOTE — Telephone Encounter (Signed)
Patient states that she's had a weird feeling today. She's been feeling a heartbeat radiating through her body. She says it doesn't feel like the baby kicking. It feels like a heart beat. Please advise.

## 2022-04-17 ENCOUNTER — Other Ambulatory Visit: Payer: Self-pay | Admitting: Obstetrics & Gynecology

## 2022-04-17 ENCOUNTER — Encounter: Payer: Self-pay | Admitting: Cardiology

## 2022-04-17 ENCOUNTER — Ambulatory Visit: Payer: Medicaid Other | Admitting: Cardiology

## 2022-04-17 MED ORDER — FLUCONAZOLE 150 MG PO TABS: ORAL_TABLET | ORAL | 1 refills | Status: DC

## 2022-04-18 ENCOUNTER — Other Ambulatory Visit: Payer: Self-pay | Admitting: Obstetrics & Gynecology

## 2022-04-18 ENCOUNTER — Ambulatory Visit (INDEPENDENT_AMBULATORY_CARE_PROVIDER_SITE_OTHER): Payer: Medicaid Other | Admitting: Obstetrics & Gynecology

## 2022-04-18 ENCOUNTER — Encounter: Payer: Self-pay | Admitting: Obstetrics & Gynecology

## 2022-04-18 ENCOUNTER — Ambulatory Visit (INDEPENDENT_AMBULATORY_CARE_PROVIDER_SITE_OTHER): Payer: Medicaid Other

## 2022-04-18 VITALS — BP 136/87 | HR 90 | Wt 357.2 lb

## 2022-04-18 DIAGNOSIS — Z6791 Unspecified blood type, Rh negative: Secondary | ICD-10-CM

## 2022-04-18 DIAGNOSIS — Z8759 Personal history of other complications of pregnancy, childbirth and the puerperium: Secondary | ICD-10-CM

## 2022-04-18 DIAGNOSIS — O133 Gestational [pregnancy-induced] hypertension without significant proteinuria, third trimester: Secondary | ICD-10-CM

## 2022-04-18 DIAGNOSIS — Z3A34 34 weeks gestation of pregnancy: Secondary | ICD-10-CM

## 2022-04-18 DIAGNOSIS — O0993 Supervision of high risk pregnancy, unspecified, third trimester: Secondary | ICD-10-CM

## 2022-04-18 DIAGNOSIS — O99210 Obesity complicating pregnancy, unspecified trimester: Secondary | ICD-10-CM

## 2022-04-18 DIAGNOSIS — O26899 Other specified pregnancy related conditions, unspecified trimester: Secondary | ICD-10-CM

## 2022-04-18 LAB — POCT URINALYSIS DIPSTICK OB
Blood, UA: NEGATIVE
Glucose, UA: NEGATIVE
Ketones, UA: NEGATIVE
Nitrite, UA: NEGATIVE

## 2022-04-18 NOTE — Progress Notes (Signed)
HIGH-RISK PREGNANCY VISIT Patient name: [name] [name] MRN [id number]  Date of birth: 08/17/1985 Chief Complaint:   Routine Prenatal Visit  History of Present Illness:   [name] [name] is a 36 y.o. [id number] female at [redacted]w[redacted]d with an Estimated Date of Delivery: 05/27/22 being seen today for ongoing management of a high-risk pregnancy complicated by [facility] on procardia xl 60 mg.    Today she reports no complaints. Contractions: Irritability. Vag. Bleeding: None.  Movement: Present. denies leaking of fluid.      02/28/2022    8:52 AM 02/01/2022   11:33 AM 01/08/2022    8:17 AM 11/14/2021   11:05 AM 12/26/2020   10:38 AM  Depression screen PHQ 2/9  Decreased Interest 2 3 0 1 1  Down, Depressed, Hopeless 2 3 1 2 1   PHQ - 2 Score 4 6 1 3 2   Altered sleeping 3 2  3 3   Tired, decreased energy 2 2  3 3   Change in appetite 1 2  0 0  Feeling bad or failure about yourself  3 3  0 0  Trouble concentrating 1 3  0 0  Moving slowly or fidgety/restless 0 0  0 0  Suicidal thoughts 0 0  0 0  PHQ-9 Score 14 18  9 8   Difficult doing work/chores  Extremely dIfficult           02/28/2022    8:52 AM 02/01/2022   11:35 AM 11/14/2021   11:05 AM 12/26/2020   10:38 AM  GAD 7 : Generalized Anxiety Score  Nervous, Anxious, on Edge 3 2 3  0  Control/stop worrying 3 3 0 0  Worry too much - different things 2 3 0 0  Trouble relaxing 3 3 0 0  Restless 3 0 0 0  Easily annoyed or irritable 3 3 0 0  Afraid - awful might happen 3 3 3  0  Total GAD 7 Score 20 17 6  0  Anxiety Difficulty  Very difficult       Review of Systems:   Pertinent items are noted in HPI Denies abnormal vaginal discharge w/ itching/odor/irritation, headaches, visual changes, shortness of breath, chest pain, abdominal pain, severe nausea/vomiting, or problems with urination or bowel movements unless otherwise stated above. Pertinent History Reviewed:  Reviewed past medical,surgical, social, obstetrical and family history.  Reviewed problem  list, medications and allergies. Physical Assessment:   Vitals:   04/18/22 1145 04/18/22 1158  BP: (!) 143/103 136/87  Pulse: 95 90  Weight: (!) 357 lb 3.2 oz (162 kg)   Body mass index is 55.95 kg/m.           Physical Examination:   General appearance: alert, well appearing, and in no distress  Mental status: alert, oriented to person, place, and time  Skin: warm & dry   Extremities: Edema: None    Cardiovascular: normal heart rate noted  Respiratory: normal respiratory effort, no distress  Abdomen: gravid, soft, non-tender  Pelvic: Cervical exam deferred         Fetal Status:     Movement: Present    Fetal Surveillance Testing today: BPP 6/8 with reactive NST  GIAVANA [name] is at [redacted]w[redacted]d Estimated Date of Delivery: 05/27/22  NST being performed due to [facility], BPP 6/8  Today the NST is Reactive  Fetal Monitoring:  Baseline: 140 bpm, Variability: Good {> 6 bpm), Accelerations: Reactive, and Decelerations: Absent   reactive  The accelerations are >15 bpm and more than 2 in  20 minutes  Final diagnosis:  Reactive NST  Florian Buff, MD     Chaperone: N/A    Results for orders placed or performed in visit on 04/18/22 (from the past 24 hour(s))  POC Urinalysis Dipstick OB   Collection Time: 04/18/22 11:53 AM  Result Value Ref Range   Color, UA     Clarity, UA     Glucose, UA Negative Negative   Bilirubin, UA     Ketones, UA neg    Spec Grav, UA     Blood, UA neg    pH, UA     POC,PROTEIN,UA Small (1+) Negative, Trace, Small (1+), Moderate (2+), Large (3+), 4+   Urobilinogen, UA     Nitrite, UA neg    Leukocytes, UA Trace (A) Negative   Appearance     Odor      Assessment & Plan:  High-risk pregnancy: [id number] at [redacted]w[redacted]d with an Estimated Date of Delivery: 05/27/22      ICD-10-CM   1. Supervision of high risk pregnancy in third trimester  O09.93 POC Urinalysis Dipstick OB    2. Gestational hypertension, third trimester  O13.3     3. Rh negative state in  antepartum period  O26.899    Z67.91     4. History of delivery of macrosomal infant  Z87.59     5. Obesity in pregnancy, antepartum  O99.210 POC Urinalysis Dipstick OB         Meds: No orders of the defined types were placed in this encounter.   Orders:  Orders Placed This Encounter  Procedures   POC Urinalysis Dipstick OB     Labs/procedures today: NST and U/S  Treatment Plan:  continue twice weekly surveillance IOL 37 weeks  Reviewed: Preterm labor symptoms and general obstetric precautions including but not limited to vaginal bleeding, contractions, leaking of fluid and fetal movement were reviewed in detail with the patient.  All questions were answered. Does have home bp cuff. Office bp cuff given: not applicable. Check bp daily, let us know if consistently >140 and/or >90.  Follow-up: Return for keep scheduled.   Future Appointments  Date Time Provider [place]  04/19/2022 10:00 AM Jacquelynn Cree, MD [facility] None  04/22/2022  9:50 AM [facility] NURSE [facility]  04/25/2022 10:00 AM [facility] [place] [facility] None  04/25/2022 11:10 AM Janyth Pupa, DO [facility]  04/29/2022  9:50 AM [facility] NURSE [facility]  05/02/2022  9:15 AM [facility] [place] [facility] None  05/02/2022 10:10 AM Janyth Pupa, DO [facility]  05/06/2022  9:30 AM [facility] NURSE [facility]  05/09/2022  9:15 AM [facility] [place] [facility] None  05/09/2022 10:10 AM Florian Buff, MD [facility]  05/13/2022  9:30 AM [facility] NURSE [facility]  05/16/2022  8:30 AM [place] - [facility] [place] [facility] None  05/16/2022  9:30 AM Florian Buff, MD [facility]  05/20/2022  9:30 AM [facility] NURSE [facility]  05/23/2022  9:15 AM [place] - [facility] [place] [facility] None  05/23/2022 10:10 AM Florian Buff, MD [facility]  05/27/2022  9:30 AM [facility] NURSE [facility]    Orders Placed This Encounter  Procedures   POC Urinalysis Dipstick OB   Florian Buff   Attending Physician for the [facility] 04/18/2022 12:37 PM

## 2022-04-19 ENCOUNTER — Ambulatory Visit (INDEPENDENT_AMBULATORY_CARE_PROVIDER_SITE_OTHER): Payer: Medicaid Other | Admitting: Psychiatry

## 2022-04-19 ENCOUNTER — Encounter (HOSPITAL_COMMUNITY): Payer: Self-pay | Admitting: Psychiatry

## 2022-04-19 ENCOUNTER — Telehealth: Payer: Self-pay

## 2022-04-19 ENCOUNTER — Telehealth (INDEPENDENT_AMBULATORY_CARE_PROVIDER_SITE_OTHER): Payer: Medicaid Other | Admitting: Cardiology

## 2022-04-19 VITALS — BP 136/87 | HR 90 | Wt 357.0 lb

## 2022-04-19 DIAGNOSIS — R002 Palpitations: Secondary | ICD-10-CM

## 2022-04-19 DIAGNOSIS — F1491 Cocaine use, unspecified, in remission: Secondary | ICD-10-CM

## 2022-04-19 DIAGNOSIS — F1290 Cannabis use, unspecified, uncomplicated: Secondary | ICD-10-CM

## 2022-04-19 DIAGNOSIS — R0602 Shortness of breath: Secondary | ICD-10-CM | POA: Diagnosis not present

## 2022-04-19 DIAGNOSIS — F4310 Post-traumatic stress disorder, unspecified: Secondary | ICD-10-CM

## 2022-04-19 DIAGNOSIS — F411 Generalized anxiety disorder: Secondary | ICD-10-CM | POA: Diagnosis not present

## 2022-04-19 DIAGNOSIS — O132 Gestational [pregnancy-induced] hypertension without significant proteinuria, second trimester: Secondary | ICD-10-CM

## 2022-04-19 DIAGNOSIS — F332 Major depressive disorder, recurrent severe without psychotic features: Secondary | ICD-10-CM | POA: Diagnosis not present

## 2022-04-19 DIAGNOSIS — F5105 Insomnia due to other mental disorder: Secondary | ICD-10-CM | POA: Diagnosis not present

## 2022-04-19 DIAGNOSIS — F3161 Bipolar disorder, current episode mixed, mild: Secondary | ICD-10-CM

## 2022-04-19 DIAGNOSIS — F99 Mental disorder, not otherwise specified: Secondary | ICD-10-CM | POA: Insufficient documentation

## 2022-04-19 DIAGNOSIS — R45851 Suicidal ideations: Secondary | ICD-10-CM

## 2022-04-19 MED ORDER — QUETIAPINE FUMARATE 50 MG PO TABS: ORAL_TABLET | ORAL | 2 refills | Status: DC
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
## 2022-04-19 NOTE — Progress Notes (Signed)
Cardio-Obstetrics Clinic  Virtual Visit via Video Note   Because of [name] [name]'s co-morbid illnesses, she is at least at moderate risk for complications without adequate follow up.  This format is felt to be most appropriate for this patient at this time.  All issues noted in this document were discussed and addressed.  A limited physical exam was performed with this format.  Please refer to the patient's chart for her consent to telehealth for [facility].     Date:  04/19/2022   ID:  [name] [name], DOB 1985-09-22, MRN [id number] The patient was identified using 2 identifiers.  Patient Location: Home Provider Location: Office/Clinic  History of Present Illness:    [name] [name] is a 36 y.o. female [[id number]] who is being seen today for the evaluation of palpitations at the request of Gilmore Laroche, FNP.   Patient has a history of bipolar disorder and morbid obesity. She is currently [redacted]w[redacted]d pregnant.   Saw Dr. Purvis Sheffield in 2020 for atypical chest pain. TTE showed LVEF 50-55%, normal RV, normal PASP, no significant valve disease. Symptoms were thought to be related to anxiety.  Was initially seen in Cardio-OB clinic on 12/14/21 for significant SOB. TTE was ordered but not completed. Zio monitor was only 1 day and showed average HR 101, no ectopy. Patient triggered events correlated with sinus tach.   Was seen in video visit on 12/2021 where she continued to have SOB on exertion but palpitations had improved and HR were running in the 90s.   Today, the patient states she is overall feeling better. Palpitations are significantly improved. SOB is stable and not progressing. No chest pain, LE edema, orthopnea or PND. Blood pressure has been elevated and nifedipine has been increased to 60mg  daily. Currently, Bps running 130s. Denies HA. Plan for induction at 37w.   Prior CV Studies Reviewed: The following studies were reviewed today: Zio Monitor 02/21/22:   Patch wear  time was 1 day and 6 hours   Predominant rhythm was sinus with average HR 101   No ectopy detected   Patient triggered events correlate with sinus tachycardia   No sustained arrhythmias or significant pauses     Patch Wear Time:  1 days and 6 hours (2023-06-01T15:36:18-0400 to 2023-06-02T21:50:56-0400)   Patient had a min HR of 55 bpm, max HR of 145 bpm, and avg HR of 101 bpm. Predominant underlying rhythm was Sinus Rhythm. No Isolated SVEs, SVE Couplets, or SVE Triplets were present. No Isolated VEs, VE Couplets, or VE Triplets were present.   TTE 11/2018: IMPRESSIONS     1. The left ventricle has low normal systolic function, with an ejection  fraction of 50-55%. Image quality not adequate to assess focal wall  motion. The cavity size was normal. Left ventricular diastolic parameters  were normal.   2. The right ventricle has normal systolic function. The cavity was  normal. There is no increase in right ventricular wall thickness. Right  ventricular systolic pressure normal with an estimated pressure of 23.2  mmHg.   3. The mitral valve is grossly normal.   4. The tricuspid valve is grossly normal.   5. The aortic valve is tricuspid.   6. The aortic root is normal in size and structure.   Past Medical History:  Diagnosis Date   Abnormal Pap smear    Anxiety    Anxiety and depression 02/01/2022   Asthma    Bipolar 1 disorder (HCC)    Depression  GERD (gastroesophageal reflux disease)    IUD (intrauterine device) in place 04/12/2013   Had IUD inserted 03/29/13 could not feel strings went to ER 9/18 and KUB saw IUD still could not see string, can't see now but seen in US   Kidney infection    Morbid obesity (HCC)    Pregnancy    UTI (urinary tract infection)     Past Surgical History:  Procedure Laterality Date   CESAREAN SECTION     HYSTEROSCOPY N/A 01/09/2021   Procedure: HYSTEROSCOPY;  Surgeon: Myna Hidalgo, DO;  Location: [facility];  Service: Gynecology;  Laterality:  N/A;   IUD REMOVAL N/A 01/09/2021   Procedure: INTRAUTERINE DEVICE (IUD) REMOVAL;  Surgeon: Myna Hidalgo, DO;  Location: [facility];  Service: Gynecology;  Laterality: N/A;   TOOTH EXTRACTION        OB History     Gravida  5   Para  2   Term  1   Preterm  1   AB  2   Living  2      SAB  2   IAB  0   Ectopic  0   Multiple  0   Live Births  2               Current Medications: Current Meds  Medication Sig   acetaminophen (TYLENOL) 500 MG tablet Take 1,000 mg by mouth daily.   albuterol (VENTOLIN HFA) 108 (90 Base) MCG/ACT inhaler Inhale 2 puffs into the lungs every 6 (six) hours as needed for wheezing or shortness of breath.   aspirin EC 81 MG tablet Take 1 tablet (81 mg total) by mouth daily. Swallow whole.   Blood Pressure Monitor MISC For regular home bp monitoring during pregnancy Needs large cuff   cefadroxil (DURICEF) 500 MG capsule Take 500 mg by mouth 2 (two) times daily.   cetirizine (ZYRTEC) 10 MG tablet Take 1 tablet (10 mg total) by mouth daily.   Doxylamine-Pyridoxine (DICLEGIS) 10-10 MG TBEC 2 tabs q hs, if sx persist add 1 tab q am on day 3, if sx persist add 1 tab q afternoon on day 4   fluconazole (DIFLUCAN) 150 MG tablet Take 1 tablet today and repeat in 3 days   fluticasone (FLONASE) 50 MCG/ACT nasal spray Place 1 spray into both nostrils daily.   FOLIC ACID PO Take by mouth.   hydrOXYzine (VISTARIL) 25 MG capsule Take 1 capsule (25 mg total) by mouth at bedtime as needed.   MELATONIN PO Take 24 mg by mouth daily at 6 (six) AM.   montelukast (SINGULAIR) 10 MG tablet Take 1 tablet (10 mg total) by mouth at bedtime.   NIFEdipine (PROCARDIA XL) 60 MG 24 hr tablet Take 1 tablet (60 mg total) by mouth daily.   nitrofurantoin, macrocrystal-monohydrate, (MACROBID) 100 MG capsule Take 1 capsule (100 mg total) by mouth daily.   Prenatal Vit-Fe Fumarate-FA (PRENATAL VITAMIN PO) Take by mouth.   QUEtiapine (SEROQUEL) 50 MG tablet Take half a tablet for one  week at night. Then increase to 1 full tablet.   UNABLE TO FIND Med Name: maternal belly band/ support belt [redacted] weeks pregnant     Allergies:   Shellfish allergy   Social History   Socioeconomic History   Marital status: Single    Spouse name: Not on file   Number of children: 2   Years of education: Not on file   Highest education level: Not on file  Occupational History  Not on file  Tobacco Use   Smoking status: Former    Types: Cigarettes    Quit date: 05/21/2007    Years since quitting: 14.9   Smokeless tobacco: Never  Vaping Use   Vaping Use: Never used  Substance and Sexual Activity   Alcohol use: Not Currently    Comment: one during pregnancy. Occasionally with drink whole bottle of liquor. Socially will have 2-3 drinks.   Drug use: Not Currently    Types: Marijuana, Cocaine    Comment: Prior to pregnancy ate edibles and 4-5 blunts per day but potentially more if drinking/playing video games.   Sexual activity: Yes    Partners: Male    Birth control/protection: None  Other Topics Concern   Not on file  Social History Narrative   Not on file   Social Determinants of Health   Financial Resource Strain: Low Risk  (02/28/2022)   Overall Financial Resource Strain (CARDIA)    Difficulty of Paying Living Expenses: Not hard at all  Food Insecurity: No Food Insecurity (02/28/2022)   Hunger Vital Sign    Worried About Running Out of Food in the Last Year: Never true    Ran Out of Food in the Last Year: Never true  Transportation Needs: No Transportation Needs (02/28/2022)   PRAPARE - Administrator, Civil Service (Medical): No    Lack of Transportation (Non-Medical): No  Physical Activity: Sufficiently Active (02/28/2022)   Exercise Vital Sign    Days of Exercise per Week: 3 days    Minutes of Exercise per Session: 60 min  Stress: Stress Concern Present (02/28/2022)   [employer] of Occupational Health - Occupational Stress Questionnaire    Feeling  of Stress : Rather much  Social Connections: Socially Isolated (02/28/2022)   Social Connection and Isolation Panel [NHANES]    Frequency of Communication with Friends and Family: Twice a week    Frequency of Social Gatherings with Friends and Family: Once a week    Attends Religious Services: Never    Database administrator or Organizations: No    Attends Engineer, structural: Never    Marital Status: Divorced      Family History  Problem Relation Age of Onset   Hypertension Father    Diabetes Father    Congestive Heart Failure Father    Diabetes Paternal Aunt    Diabetes Paternal Uncle    Hypertension Paternal Grandmother    Diabetes Paternal Grandmother    Diabetes Paternal Grandfather       ROS:   Please see the history of present illness.     All other systems reviewed and are negative.   Labs/EKG Reviewed:    EKG:   No new ECG  Recent Labs: 02/28/2022: Hemoglobin 12.4; Platelets 213 04/04/2022: ALT 37; BUN 7; Creatinine, Ser 0.62; Potassium 4.3; Sodium 140   Recent Lipid Panel No results found for: "CHOL", "TRIG", "HDL", "CHOLHDL", "LDLCALC", "LDLDIRECT"  Physical Exam:    VS:  BP 136/87 Comment: 04/18/22  Pulse 90 Comment: 04/18/22  Wt (!) 357 lb (161.9 kg)   LMP 08/20/2021   BMI 55.91 kg/m     Wt Readings from Last 3 Encounters:  04/19/22 (!) 357 lb (161.9 kg)  04/18/22 (!) 357 lb 3.2 oz (162 kg)  04/15/22 (!) 358 lb 6.4 oz (162.6 kg)     General: Comfortable, NAD Extremities: Trace edema   ASSESSMENT & PLAN:    #Palpitations: Overall improved. No chest pain, lightheadedness  or syncope. Zio with only 1 day worth of data but shows no significant arrhythmias. Will continue to monitor. -Zio reassuring and overall symptoms improved -Discussed importance of hydration -Small, frequent meals   #Dyspnea on Exertion: Has been ongoing since the start of her pregnancy. Has been persistent. May be multifactorial in the setting of hormonal shifts  in pregnancy, obesity and possible asthma. Zio monitor unremarkable. Never scheduled TTE. Overall, symptoms stable and declined scheduling TTE at this time.   #Gestational HTN: Running mainly 130s at home on increased dose of nifedipine. Discussed importance of continuing to monitor and if >140/90, will increase nifedipine to 90mg  daily. -Continue nifedipine 60mg  daily -Keep BP log at home; if SBP>140 consistently, will increase nifedipine to 90mg  daily   #High Risk Pregnancy: #Morbid Obesity: Has discussed with OBGYN. Currently on ASA 81mg  daily. Will need continued prevention efforts going forward.  Patient Instructions  Medication Instructions:   *If you need a refill on your cardiac medications before your next appointment, please call your pharmacy*   Lab Work:  If you have labs (blood work) drawn today and your tests are completely normal, you will receive your results only by: [place] (if you have MyChart) OR A paper copy in the mail If you have any lab test that is abnormal or we need to change your treatment, we will call you to review the results.   Testing/Procedures:    Follow-Up: At [facility], you and your health needs are our priority.  As part of our continuing mission to provide you with exceptional heart care, we have created designated Provider Care Teams.  These Care Teams include your primary Cardiologist (physician) and Advanced Practice Providers (APPs -  Physician Assistants and Nurse Practitioners) who all work together to provide you with the care you need, when you need it.  We recommend signing up for the patient portal called "MyChart".  Sign up information is provided on this After Visit Summary.  MyChart is used to connect with patients for Virtual Visits (Telemedicine).  Patients are able to view lab/test results, encounter notes, upcoming appointments, etc.  Non-urgent messages can be sent to your provider as well.   To learn more  about what you can do with MyChart, go to [email or web address].    Your next appointment:   6 month(s)  The format for your next appointment:   In Person  Provider: Dr Gwyndolyn Kaufman    Other Instructions   Important Information About Sugar         Dispo:  No follow-ups on file.   Medication Adjustments/Labs and Tests Ordered: Current medicines are reviewed at length with the patient today.  Concerns regarding medicines are outlined above.  Tests Ordered: No orders of the defined types were placed in this encounter.  Medication Changes: No orders of the defined types were placed in this encounter.

## 2022-04-19 NOTE — Patient Instructions (Signed)
Medication Instructions:   *If you need a refill on your cardiac medications before your next appointment, please call your pharmacy*   Lab Work:  If you have labs (blood work) drawn today and your tests are completely normal, you will receive your results only by: [place] (if you have MyChart) OR A paper copy in the mail If you have any lab test that is abnormal or we need to change your treatment, we will call you to review the results.   Testing/Procedures:    Follow-Up: At [facility], you and your health needs are our priority.  As part of our continuing mission to provide you with exceptional heart care, we have created designated Provider Care Teams.  These Care Teams include your primary Cardiologist (physician) and Advanced Practice Providers (APPs -  Physician Assistants and Nurse Practitioners) who all work together to provide you with the care you need, when you need it.  We recommend signing up for the patient portal called "MyChart".  Sign up information is provided on this After Visit Summary.  MyChart is used to connect with patients for Virtual Visits (Telemedicine).  Patients are able to view lab/test results, encounter notes, upcoming appointments, etc.  Non-urgent messages can be sent to your provider as well.   To learn more about what you can do with MyChart, go to [email or web address].    Your next appointment:   6 month(s)  The format for your next appointment:   In Person  Provider: Dr Gwyndolyn Kaufman    Other Instructions   Important Information About Sugar

## 2022-04-19 NOTE — Patient Instructions (Signed)
We changed your medications today. Stop taking unisom and hydroxyzine. Start seroquel 25mg  (half a tablet) nightly for one week then increase to 50mg  (1 full tablet) nightly thereafter. Your OB team may start zoloft at 25mg  once daily after you deliver. Dr. Nehemiah Settle has reached out to your PCP and OB provider, we need an updated ecg, a1c (blood sugar), and lipid panel for monitoring while you are on medication like seroquel. We discussed the [email or web address] website which can be helpful to know what certain exposures mean for your pregnancy and breastfeeding.

## 2022-04-19 NOTE — Telephone Encounter (Signed)
Left a message for the [name] [name] to please call back to make her an appt this afternoon with Dr Johney Frame at the [facility] clinic.

## 2022-04-22 ENCOUNTER — Ambulatory Visit (INDEPENDENT_AMBULATORY_CARE_PROVIDER_SITE_OTHER): Payer: Medicaid Other | Admitting: *Deleted

## 2022-04-22 VITALS — BP 136/88 | HR 90

## 2022-04-22 DIAGNOSIS — Z8759 Personal history of other complications of pregnancy, childbirth and the puerperium: Secondary | ICD-10-CM

## 2022-04-22 DIAGNOSIS — Z3A35 35 weeks gestation of pregnancy: Secondary | ICD-10-CM | POA: Diagnosis not present

## 2022-04-22 DIAGNOSIS — O0993 Supervision of high risk pregnancy, unspecified, third trimester: Secondary | ICD-10-CM | POA: Diagnosis not present

## 2022-04-22 DIAGNOSIS — O133 Gestational [pregnancy-induced] hypertension without significant proteinuria, third trimester: Secondary | ICD-10-CM

## 2022-04-22 DIAGNOSIS — Z6791 Unspecified blood type, Rh negative: Secondary | ICD-10-CM

## 2022-04-22 DIAGNOSIS — O99210 Obesity complicating pregnancy, unspecified trimester: Secondary | ICD-10-CM

## 2022-04-22 NOTE — Progress Notes (Signed)
   NURSE VISIT- NST  SUBJECTIVE:  [name] [name] is a 37 y.o. [phone] female at [redacted]w[redacted]d, here for a NST for pregnancy complicated by [facility].  She reports active fetal movement, contractions: none, vaginal bleeding: none, membranes: intact.   OBJECTIVE:  BP 136/88   Pulse 90   LMP 08/20/2021   Appears well, no apparent distress  No results found for this or any previous visit (from the past 24 hour(s)).  NST: FHR baseline 150 bpm, Variability: moderate, Accelerations:present, Decelerations:  Absent= Cat 1/reactive Toco: none   ASSESSMENT: [id number] at [redacted]w[redacted]d with GHTN NST reactive  PLAN: EFM strip reviewed by Dr. Elonda Husky   Recommendations: keep next appointment as scheduled    Alice Rieger  04/22/2022 11:08 AM

## 2022-04-24 ENCOUNTER — Other Ambulatory Visit: Payer: Self-pay | Admitting: Obstetrics & Gynecology

## 2022-04-24 DIAGNOSIS — O133 Gestational [pregnancy-induced] hypertension without significant proteinuria, third trimester: Secondary | ICD-10-CM

## 2022-04-25 ENCOUNTER — Ambulatory Visit (INDEPENDENT_AMBULATORY_CARE_PROVIDER_SITE_OTHER): Payer: Medicaid Other | Admitting: Obstetrics & Gynecology

## 2022-04-25 ENCOUNTER — Encounter: Payer: Self-pay | Admitting: Obstetrics & Gynecology

## 2022-04-25 ENCOUNTER — Other Ambulatory Visit (HOSPITAL_COMMUNITY)
Admission: RE | Admit: 2022-04-25 | Discharge: 2022-04-25 | Disposition: A | Discharge status: Home / Self Care | Payer: Medicaid Other | Source: Ambulatory Visit | Attending: Obstetrics & Gynecology | Admitting: Obstetrics & Gynecology

## 2022-04-25 ENCOUNTER — Ambulatory Visit (INDEPENDENT_AMBULATORY_CARE_PROVIDER_SITE_OTHER): Payer: Medicaid Other

## 2022-04-25 VITALS — BP 103/73 | HR 91 | Wt 357.4 lb

## 2022-04-25 DIAGNOSIS — O0993 Supervision of high risk pregnancy, unspecified, third trimester: Secondary | ICD-10-CM

## 2022-04-25 DIAGNOSIS — O133 Gestational [pregnancy-induced] hypertension without significant proteinuria, third trimester: Secondary | ICD-10-CM

## 2022-04-25 DIAGNOSIS — Z6791 Unspecified blood type, Rh negative: Secondary | ICD-10-CM

## 2022-04-25 DIAGNOSIS — Z3A35 35 weeks gestation of pregnancy: Secondary | ICD-10-CM | POA: Diagnosis not present

## 2022-04-25 DIAGNOSIS — Z8759 Personal history of other complications of pregnancy, childbirth and the puerperium: Secondary | ICD-10-CM

## 2022-04-25 DIAGNOSIS — Z98891 History of uterine scar from previous surgery: Secondary | ICD-10-CM

## 2022-04-25 DIAGNOSIS — O99210 Obesity complicating pregnancy, unspecified trimester: Secondary | ICD-10-CM

## 2022-04-25 NOTE — Progress Notes (Signed)
HIGH-RISK PREGNANCY VISIT Patient name: [name] [name] MRN [id number]  Date of birth: [birth year] Chief Complaint:   Routine Prenatal Visit  History of Present Illness:   [name] [name] is a 36 y.o. [phone] female at [redacted]w[redacted]d with an Estimated Date of Delivery: 05/27/22 being seen today for ongoing management of a high-risk pregnancy complicated by:  Ronita Hipps- on procardia XL 60mg  daily Asymptomatic -TOLAC -morbid obesity Recurrent UTIs- on supression therapy O neg- s/p RhoGAM  Today she reports  yesterday noted 2hr of contractions then resolved. Denies contractions today .   She noted episode of feeling her pulse in her abdomen and chest pain- lasted for a few minutes then resolved.  No symptoms currently.  Advised this could be acid reflux- try OTC remedies- if no improvement, pt to go to ER for further evaluation  Contractions: Irritability.  .  Movement: Present. denies leaking of fluid.      04/19/2022   12:32 PM 02/28/2022    8:52 AM 02/01/2022   11:33 AM 01/08/2022    8:17 AM 11/14/2021   11:05 AM  Depression screen PHQ 2/9  Decreased Interest  2 3 0 1  Down, Depressed, Hopeless  2 3 1 2   PHQ - 2 Score  4 6 1 3   Altered sleeping  3 2  3   Tired, decreased energy  2 2  3   Change in appetite  1 2  0  Feeling bad or failure about yourself   3 3  0  Trouble concentrating  1 3  0  Moving slowly or fidgety/restless  0 0  0  Suicidal thoughts  0 0  0  PHQ-9 Score  14 18  9   Difficult doing work/chores   Extremely dIfficult       Information is confidential and restricted. Go to Review Flowsheets to unlock data.     Current Outpatient Medications  Medication Instructions   acetaminophen (TYLENOL) 1,000 mg, Oral, Daily   albuterol (VENTOLIN HFA) 108 (90 Base) MCG/ACT inhaler 2 puffs, Inhalation, Every 6 hours PRN   aspirin EC 81 mg, Oral, Daily, Swallow whole.   Blood Pressure Monitor MISC For regular home bp monitoring during pregnancy<BR>Needs large cuff   cefadroxil  (DURICEF) 500 mg, 2 times daily   cetirizine (ZYRTEC) 10 mg, Oral, Daily   Doxylamine-Pyridoxine (DICLEGIS) 10-10 MG TBEC 2 tabs q hs, if sx persist add 1 tab q am on day 3, if sx persist add 1 tab q afternoon on day 4   fluconazole (DIFLUCAN) 150 MG tablet Take 1 tablet today and repeat in 3 days   fluticasone (FLONASE) 50 MCG/ACT nasal spray 1 spray, Each Nare, Daily   FOLIC ACID PO Oral   hydrOXYzine (VISTARIL) 25 mg, Oral, At bedtime PRN   MELATONIN PO 24 mg, Oral, Daily   montelukast (SINGULAIR) 10 mg, Oral, Daily at bedtime   NIFEdipine (PROCARDIA XL) 60 mg, Oral, Daily   nitrofurantoin (macrocrystal-monohydrate) (MACROBID) 100 mg, Oral, Daily   Prenatal Vit-Fe Fumarate-FA (PRENATAL VITAMIN PO) Oral   QUEtiapine (SEROQUEL) 50 MG tablet Take half a tablet for one week at night. Then increase to 1 full tablet.   UNABLE TO FIND Med Name: maternal belly band/ support belt<BR>[redacted] weeks pregnant     Review of Systems:   Pertinent items are noted in HPI Denies abnormal vaginal discharge w/ itching/odor/irritation, headaches, visual changes, shortness of breath, abdominal pain, severe nausea/vomiting, or problems with urination or bowel movements unless otherwise stated above. Pertinent History Reviewed:  Reviewed past medical,surgical, social, obstetrical and family history.  Reviewed problem list, medications and allergies. Physical Assessment:   Vitals:   04/25/22 1044  BP: 103/73  Pulse: 91  Weight: (!) 357 lb 6.4 oz (162.1 kg)  Body mass index is 55.98 kg/m.           Physical Examination:   General appearance: alert, well appearing, and in no distress  Mental status: normal mood, behavior, speech, dress, motor activity, and thought processes  Skin: warm & dry   Extremities: Edema: Trace    Cardiovascular: normal heart rate noted  Respiratory: normal respiratory effort, no distress  Abdomen: gravid, soft, non-tender  Pelvic: Cervical exam performed  Dilation: Closed  Effacement (%): Thick Station: -3 Vaginal swabs collected  Fetal Status:     Movement: Present    Fetal Surveillance Testing today: cephalic,BPP 8/3,MOQ 94.7 cm,FHR 133 bpm,RI .57,.58,.62=56%   Chaperone: Celene Squibb    No results found for this or any previous visit (from the past 24 hour(s)).   Assessment & Plan:  High-risk pregnancy: [id number] at [redacted]w[redacted]d with an Estimated Date of Delivery: 05/27/22   1) Gestational HTN -continue procardia -reviewed preeclampsia precautions -continue twice weekly testing -reviewed IOL 37-39wk- pending BP management  2) TOLAC 3) Morbid obesity 4) Recurrent UTIs- on supression therapy O neg- s/p RhoGAM    Meds: No orders of the defined types were placed in this encounter.   Labs/procedures today: BPP, GBS, GC/C collected  Treatment Plan:  as outlined above  Reviewed: Preterm labor symptoms and general obstetric precautions including but not limited to vaginal bleeding, contractions, leaking of fluid and fetal movement were reviewed in detail with the patient.  All questions were answered. Pt has home bp cuff. Check bp weekly, let us know if >140/90.   Follow-up: Return for twice weekly as scheduled.   Future Appointments  Date Time Provider [place]  04/29/2022  9:50 AM [facility] NURSE [facility]  05/02/2022  9:15 AM [facility] [place] [facility] None  05/02/2022 10:10 AM Janyth Pupa, DO [facility]  05/06/2022  9:30 AM [facility] NURSE [facility]  05/09/2022  9:15 AM [facility] [place] [facility] None  05/09/2022 10:10 AM Florian Buff, MD [facility]  05/13/2022  9:30 AM [facility] NURSE [facility]  05/16/2022  8:30 AM [facility] [place] [facility] None  05/16/2022  9:30 AM Florian Buff, MD [facility]  05/20/2022  9:30 AM [facility] NURSE [facility]  05/23/2022  9:15 AM [facility] [place] [facility] None  05/23/2022 10:10 AM Florian Buff, MD [facility]  05/27/2022  9:30 AM [facility] NURSE  [facility]  10/09/2022  3:00 PM Freada Bergeron, MD [facility]    Orders Placed This Encounter  Procedures   Culture, beta strep (group b only)   POC Urinalysis Dipstick OB    Janyth Pupa, DO Attending [place], [place] for [employer], [place]

## 2022-04-25 NOTE — Progress Notes (Signed)
[place] 52+0 wks,cephalic,BPP 8/0,EMV 36.1 cm,FHR 133 bpm,RI .57,.58,.62=56%

## 2022-04-26 LAB — CERVICOVAGINAL ANCILLARY ONLY
Chlamydia: NEGATIVE
Comment: NEGATIVE
Comment: NORMAL
Neisseria Gonorrhea: NEGATIVE

## 2022-04-28 LAB — CULTURE, BETA STREP (GROUP B ONLY): Strep Gp B Culture: POSITIVE — AB

## 2022-04-29 ENCOUNTER — Ambulatory Visit (INDEPENDENT_AMBULATORY_CARE_PROVIDER_SITE_OTHER): Payer: Medicaid Other | Admitting: *Deleted

## 2022-04-29 VITALS — BP 138/93 | HR 103

## 2022-04-29 DIAGNOSIS — O26899 Other specified pregnancy related conditions, unspecified trimester: Secondary | ICD-10-CM

## 2022-04-29 DIAGNOSIS — Z8759 Personal history of other complications of pregnancy, childbirth and the puerperium: Secondary | ICD-10-CM

## 2022-04-29 DIAGNOSIS — O0993 Supervision of high risk pregnancy, unspecified, third trimester: Secondary | ICD-10-CM | POA: Diagnosis not present

## 2022-04-29 DIAGNOSIS — O133 Gestational [pregnancy-induced] hypertension without significant proteinuria, third trimester: Secondary | ICD-10-CM | POA: Diagnosis not present

## 2022-04-29 DIAGNOSIS — O99210 Obesity complicating pregnancy, unspecified trimester: Secondary | ICD-10-CM

## 2022-04-29 DIAGNOSIS — Z3A36 36 weeks gestation of pregnancy: Secondary | ICD-10-CM

## 2022-04-29 LAB — POCT URINALYSIS DIPSTICK OB
Blood, UA: NEGATIVE
Glucose, UA: NEGATIVE
Ketones, UA: NEGATIVE
Leukocytes, UA: NEGATIVE
Nitrite, UA: NEGATIVE
POC,PROTEIN,UA: NEGATIVE

## 2022-04-29 NOTE — Progress Notes (Signed)
   NURSE VISIT- NST  SUBJECTIVE:  NASHAY BRICKLEY is a 36 y.o. [phone] female at [redacted]w[redacted]d, here for a NST for pregnancy complicated by [facility].  She reports active fetal movement, contractions: none, vaginal bleeding: none, membranes: intact.   OBJECTIVE:  BP (!) 138/93   Pulse (!) 103   LMP 08/20/2021   Appears well, no apparent distress  Results for orders placed or performed in visit on 04/29/22 (from the past 24 hour(s))  POC Urinalysis Dipstick OB   Collection Time: 04/29/22 12:04 PM  Result Value Ref Range   Color, UA     Clarity, UA     Glucose, UA Negative Negative   Bilirubin, UA     Ketones, UA neg    Spec Grav, UA     Blood, UA neg    pH, UA     POC,PROTEIN,UA Negative Negative, Trace, Small (1+), Moderate (2+), Large (3+), 4+   Urobilinogen, UA     Nitrite, UA neg    Leukocytes, UA Negative Negative   Appearance     Odor      NST: FHR baseline 135 bpm, Variability: moderate, Accelerations:present, Decelerations:  Absent= Cat 1/reactive Toco: none   ASSESSMENT: [id number] at [redacted]w[redacted]d with GHTN NST reactive  PLAN: EFM strip reviewed by Knute Neu, CNM, [facility]   Recommendations: keep next appointment as scheduled    Alice Rieger  04/29/2022 12:07 PM

## 2022-05-01 ENCOUNTER — Other Ambulatory Visit: Payer: Self-pay | Admitting: Family Medicine

## 2022-05-01 ENCOUNTER — Other Ambulatory Visit: Payer: Self-pay | Admitting: Obstetrics & Gynecology

## 2022-05-01 DIAGNOSIS — J302 Other seasonal allergic rhinitis: Secondary | ICD-10-CM

## 2022-05-01 DIAGNOSIS — O133 Gestational [pregnancy-induced] hypertension without significant proteinuria, third trimester: Secondary | ICD-10-CM

## 2022-05-01 DIAGNOSIS — O99213 Obesity complicating pregnancy, third trimester: Secondary | ICD-10-CM

## 2022-05-02 ENCOUNTER — Ambulatory Visit (INDEPENDENT_AMBULATORY_CARE_PROVIDER_SITE_OTHER): Payer: Medicaid Other | Admitting: Obstetrics & Gynecology

## 2022-05-02 ENCOUNTER — Encounter: Payer: Self-pay | Admitting: Obstetrics & Gynecology

## 2022-05-02 ENCOUNTER — Ambulatory Visit (INDEPENDENT_AMBULATORY_CARE_PROVIDER_SITE_OTHER): Payer: Medicaid Other

## 2022-05-02 VITALS — BP 127/88 | HR 96 | Wt 362.5 lb

## 2022-05-02 DIAGNOSIS — O99210 Obesity complicating pregnancy, unspecified trimester: Secondary | ICD-10-CM

## 2022-05-02 DIAGNOSIS — O0993 Supervision of high risk pregnancy, unspecified, third trimester: Secondary | ICD-10-CM

## 2022-05-02 DIAGNOSIS — O99213 Obesity complicating pregnancy, third trimester: Secondary | ICD-10-CM | POA: Diagnosis not present

## 2022-05-02 DIAGNOSIS — Z98891 History of uterine scar from previous surgery: Secondary | ICD-10-CM

## 2022-05-02 DIAGNOSIS — O133 Gestational [pregnancy-induced] hypertension without significant proteinuria, third trimester: Secondary | ICD-10-CM | POA: Diagnosis not present

## 2022-05-02 DIAGNOSIS — Z3A36 36 weeks gestation of pregnancy: Secondary | ICD-10-CM

## 2022-05-02 DIAGNOSIS — O2343 Unspecified infection of urinary tract in pregnancy, third trimester: Secondary | ICD-10-CM

## 2022-05-02 DIAGNOSIS — Z8759 Personal history of other complications of pregnancy, childbirth and the puerperium: Secondary | ICD-10-CM

## 2022-05-02 DIAGNOSIS — Z6791 Unspecified blood type, Rh negative: Secondary | ICD-10-CM

## 2022-05-02 LAB — POCT URINALYSIS DIPSTICK OB
Blood, UA: NEGATIVE
Glucose: NEGATIVE
Ketones, UA: NEGATIVE
Leukocytes, UA: NEGATIVE
Nitrite, UA: NEGATIVE

## 2022-05-02 NOTE — Progress Notes (Signed)
[place] 67+8 wks,cephalic,BPP 9/3,YBO 175 BPM,anterior placenta gr 3,RI .57,.58,.60,.50=47%,AFI 13.7 cm

## 2022-05-02 NOTE — Progress Notes (Signed)
HIGH-RISK PREGNANCY VISIT Patient name: [name] [name] MRN [id number]  Date of birth: 01/13/1986 Chief Complaint:   Routine Prenatal Visit  History of Present Illness:   [name] [name] is a 36 y.o. [id number] female at [redacted]w[redacted]d with an Estimated Date of Delivery: 05/27/22 being seen today for ongoing management of a high-risk pregnancy complicated by:  -Gestational HTN- on procardia XL 60mg  daily -prior C_section, desires TOLAC -morbid obesity -anxiety/depression- doing better with seroquel -Recurrent UTI- on macrobid suppression  Today she reports no complaints.   Contractions: Irritability. Vag. Bleeding: None.  Movement: Present. denies leaking of fluid.      04/19/2022   12:32 PM 02/28/2022    8:52 AM 02/01/2022   11:33 AM 01/08/2022    8:17 AM 11/14/2021   11:05 AM  Depression screen PHQ 2/9  Decreased Interest  2 3 0 1  Down, Depressed, Hopeless  2 3 1 2   PHQ - 2 Score  4 6 1 3   Altered sleeping  3 2  3   Tired, decreased energy  2 2  3   Change in appetite  1 2  0  Feeling bad or failure about yourself   3 3  0  Trouble concentrating  1 3  0  Moving slowly or fidgety/restless  0 0  0  Suicidal thoughts  0 0  0  PHQ-9 Score  14 18  9   Difficult doing work/chores   Extremely dIfficult       Information is confidential and restricted. Go to Review Flowsheets to unlock data.     Current Outpatient Medications  Medication Instructions   acetaminophen (TYLENOL) 1,000 mg, Oral, Daily   albuterol (VENTOLIN HFA) 108 (90 Base) MCG/ACT inhaler 2 puffs, Inhalation, Every 6 hours PRN   aspirin EC 81 mg, Oral, Daily, Swallow whole.   Blood Pressure Monitor MISC For regular home bp monitoring during pregnancy<BR>Needs large cuff   cefadroxil (DURICEF) 500 mg, 2 times daily   cetirizine (ZYRTEC) 10 mg, Oral, Daily   Doxylamine-Pyridoxine (DICLEGIS) 10-10 MG TBEC 2 tabs q hs, if sx persist add 1 tab q am on day 3, if sx persist add 1 tab q afternoon on day 4   fluconazole (DIFLUCAN)  150 MG tablet Take 1 tablet today and repeat in 3 days   fluticasone (FLONASE) 50 MCG/ACT nasal spray 1 spray, Each Nare, Daily   FOLIC ACID PO Oral   hydrOXYzine (VISTARIL) 25 mg, Oral, At bedtime PRN   MELATONIN PO 24 mg, Oral, Daily   montelukast (SINGULAIR) 10 mg, Oral, Daily at bedtime   NIFEdipine (PROCARDIA XL) 60 mg, Oral, Daily   nitrofurantoin (macrocrystal-monohydrate) (MACROBID) 100 mg, Oral, Daily   Prenatal Vit-Fe Fumarate-FA (PRENATAL VITAMIN PO) Oral   QUEtiapine (SEROQUEL) 50 MG tablet Take half a tablet for one week at night. Then increase to 1 full tablet.   UNABLE TO FIND Med Name: maternal belly band/ support belt<BR>[redacted] weeks pregnant     Review of Systems:   Pertinent items are noted in HPI Denies abnormal vaginal discharge w/ itching/odor/irritation, headaches, visual changes, shortness of breath, chest pain, abdominal pain, severe nausea/vomiting, or problems with urination or bowel movements unless otherwise stated above. Pertinent History Reviewed:  Reviewed past medical,surgical, social, obstetrical and family history.  Reviewed problem list, medications and allergies. Physical Assessment:   Vitals:   05/02/22 1017  BP: 127/88  Pulse: 96  Weight: (!) 362 lb 8 oz (164.4 kg)  Body mass index is 56.78 kg/m.  Physical Examination:   General appearance: alert, well appearing, and in no distress  Mental status: normal mood, behavior, speech, dress, motor activity, and thought processes  Skin: warm & dry   Extremities: Edema: Trace    Cardiovascular: normal heart rate noted  Respiratory: normal respiratory effort, no distress  Abdomen: gravid, soft, non-tender  Pelvic:         Cervical exam deferred Fetal Status:     Movement: Present    Fetal Surveillance Testing today: cephalic,BPP 1/6,XWR 604 BPM,anterior placenta gr 3,RI .57,.58,.60,.50=47%,AFI 13.7 cm   Chaperone: N/A    Results for orders placed or performed in visit on 05/02/22 (from  the past 24 hour(s))  POC Urinalysis Dipstick OB   Collection Time: 05/02/22 10:21 AM  Result Value Ref Range   Color, UA     Clarity, UA     Bilirubin, UA     Ketones, UA negative    Spec Grav, UA     Blood, UA negative    pH, UA     POC,PROTEIN,UA Small (1+) Negative, Trace, Small (1+), Moderate (2+), Large (3+), 4+   Urobilinogen, UA     Nitrite, UA negative    Leukocytes, UA Negative Negative   Appearance     Odor     Glucose negative      Assessment & Plan:  High-risk pregnancy: [id number] at [redacted]w[redacted]d with an Estimated Date of Delivery: 05/27/22   1) Gestational HTN -scheduled for IOL @ [redacted]w[redacted]d- reviewed plan -keep Monday appt to continue with twice weekly antepartum testing -reviewed preeclampsia precautions  -morbid obesity -TOLAC -Anxiety/Depression- followed by psych, on seroquel, []  plan to add zoloft postpartum GBS bacteruia O neg Recurrent UTI  Meds: No orders of the defined types were placed in this encounter.   Labs/procedures today: BPP/dopplers  Treatment Plan:  as outlined above  Reviewed: Preterm labor symptoms and general obstetric precautions including but not limited to vaginal bleeding, contractions, leaking of fluid and fetal movement were reviewed in detail with the patient.  All questions were answered. Pt has home bp cuff. Check bp weekly, let us know if >140/90.   Follow-up: Return for Keep Monday appt, IOL scheduled for Tuesday then 1wk BP check (10/24).   Future Appointments  Date Time Provider [place]  05/06/2022  9:30 AM [facility] NURSE [facility]  05/07/2022  6:45 AM [facility] SCHED ROOM [facility] None  05/14/2022  3:30 PM [facility] NURSE [facility]  10/09/2022  3:00 PM Freada Bergeron, MD [facility]    Orders Placed This Encounter  Procedures   POC Urinalysis Dipstick OB    Janyth Pupa, DO Attending [place], [place] for [employer], [place]

## 2022-05-03 ENCOUNTER — Encounter (HOSPITAL_COMMUNITY): Payer: Self-pay | Admitting: *Deleted

## 2022-05-03 ENCOUNTER — Telehealth (HOSPITAL_COMMUNITY): Payer: Self-pay | Admitting: *Deleted

## 2022-05-03 NOTE — Telephone Encounter (Signed)
Preadmission screen  

## 2022-05-05 ENCOUNTER — Other Ambulatory Visit: Payer: Self-pay | Admitting: Advanced Practice Midwife

## 2022-05-06 ENCOUNTER — Ambulatory Visit (INDEPENDENT_AMBULATORY_CARE_PROVIDER_SITE_OTHER): Payer: Medicaid Other | Admitting: *Deleted

## 2022-05-06 VITALS — BP 125/84 | HR 105 | Wt 360.8 lb

## 2022-05-06 DIAGNOSIS — O133 Gestational [pregnancy-induced] hypertension without significant proteinuria, third trimester: Secondary | ICD-10-CM | POA: Diagnosis not present

## 2022-05-06 DIAGNOSIS — Z331 Pregnant state, incidental: Secondary | ICD-10-CM

## 2022-05-06 DIAGNOSIS — Z1389 Encounter for screening for other disorder: Secondary | ICD-10-CM

## 2022-05-06 DIAGNOSIS — Z3A37 37 weeks gestation of pregnancy: Secondary | ICD-10-CM | POA: Diagnosis not present

## 2022-05-06 DIAGNOSIS — O0993 Supervision of high risk pregnancy, unspecified, third trimester: Secondary | ICD-10-CM | POA: Diagnosis not present

## 2022-05-06 DIAGNOSIS — O288 Other abnormal findings on antenatal screening of mother: Secondary | ICD-10-CM

## 2022-05-06 LAB — POCT URINALYSIS DIPSTICK OB
Glucose, UA: NEGATIVE
Ketones, UA: NEGATIVE
Nitrite, UA: NEGATIVE

## 2022-05-06 NOTE — Progress Notes (Addendum)
   NURSE VISIT- NST  SUBJECTIVE:  [name] [name] is a 36 y.o. [phone] female at [redacted]w[redacted]d, here for a NST for pregnancy complicated by [facility].  She reports active fetal movement, contractions: none, vaginal bleeding: none, membranes: intact.   OBJECTIVE:  BP 125/84   Pulse (!) 105   Wt (!) 360 lb 12.8 oz (163.7 kg)   LMP 08/20/2021   BMI 56.51 kg/m   Appears well, no apparent distress  Results for orders placed or performed in visit on 05/06/22 (from the past 24 hour(s))  POC Urinalysis Dipstick OB   Collection Time: 05/06/22 10:05 AM  Result Value Ref Range   Color, UA     Clarity, UA     Glucose, UA Negative Negative   Bilirubin, UA     Ketones, UA neg    Spec Grav, UA     Blood, UA trace    pH, UA     POC,PROTEIN,UA Trace Negative, Trace, Small (1+), Moderate (2+), Large (3+), 4+   Urobilinogen, UA     Nitrite, UA neg    Leukocytes, UA Moderate (2+) (A) Negative   Appearance     Odor      NST: FHR baseline 120 bpm, Variability: moderate, Accelerations:present, Decelerations:  Absent= Cat 1/reactive Toco: none   ASSESSMENT: [id number] at [redacted]w[redacted]d with GHTN NST reactive  PLAN: EFM strip reviewed by Nigel Berthold, CNM   Recommendations: keep next appointment as scheduled    Alice Rieger  05/06/2022 2:35 PM

## 2022-05-07 ENCOUNTER — Inpatient Hospital Stay (HOSPITAL_COMMUNITY): Payer: Medicaid Other

## 2022-05-08 ENCOUNTER — Inpatient Hospital Stay (HOSPITAL_COMMUNITY)
Admission: RE | Admit: 2022-05-08 | Discharge: 2022-05-11 | DRG: 807 | Disposition: A | Discharge status: Home / Self Care | Payer: Medicaid Other | Attending: Obstetrics & Gynecology | Admitting: Obstetrics & Gynecology

## 2022-05-08 ENCOUNTER — Encounter (HOSPITAL_COMMUNITY): Payer: Self-pay | Admitting: Obstetrics & Gynecology

## 2022-05-08 DIAGNOSIS — O139 Gestational [pregnancy-induced] hypertension without significant proteinuria, unspecified trimester: Secondary | ICD-10-CM | POA: Diagnosis present

## 2022-05-08 DIAGNOSIS — Z6791 Unspecified blood type, Rh negative: Secondary | ICD-10-CM | POA: Diagnosis not present

## 2022-05-08 DIAGNOSIS — O99824 Streptococcus B carrier state complicating childbirth: Secondary | ICD-10-CM | POA: Diagnosis present

## 2022-05-08 DIAGNOSIS — O1002 Pre-existing essential hypertension complicating childbirth: Principal | ICD-10-CM | POA: Diagnosis present

## 2022-05-08 DIAGNOSIS — O133 Gestational [pregnancy-induced] hypertension without significant proteinuria, third trimester: Secondary | ICD-10-CM

## 2022-05-08 DIAGNOSIS — O26899 Other specified pregnancy related conditions, unspecified trimester: Secondary | ICD-10-CM

## 2022-05-08 DIAGNOSIS — O99214 Obesity complicating childbirth: Secondary | ICD-10-CM | POA: Diagnosis present

## 2022-05-08 DIAGNOSIS — J4520 Mild intermittent asthma, uncomplicated: Secondary | ICD-10-CM | POA: Diagnosis present

## 2022-05-08 DIAGNOSIS — O34219 Maternal care for unspecified type scar from previous cesarean delivery: Secondary | ICD-10-CM | POA: Diagnosis present

## 2022-05-08 DIAGNOSIS — O99210 Obesity complicating pregnancy, unspecified trimester: Principal | ICD-10-CM

## 2022-05-08 DIAGNOSIS — O9952 Diseases of the respiratory system complicating childbirth: Secondary | ICD-10-CM | POA: Diagnosis present

## 2022-05-08 DIAGNOSIS — Z3A37 37 weeks gestation of pregnancy: Secondary | ICD-10-CM | POA: Diagnosis not present

## 2022-05-08 DIAGNOSIS — O0993 Supervision of high risk pregnancy, unspecified, third trimester: Secondary | ICD-10-CM

## 2022-05-08 DIAGNOSIS — Z7982 Long term (current) use of aspirin: Secondary | ICD-10-CM | POA: Diagnosis not present

## 2022-05-08 DIAGNOSIS — Z8759 Personal history of other complications of pregnancy, childbirth and the puerperium: Secondary | ICD-10-CM

## 2022-05-08 DIAGNOSIS — O26893 Other specified pregnancy related conditions, third trimester: Secondary | ICD-10-CM | POA: Diagnosis present

## 2022-05-08 DIAGNOSIS — D563 Thalassemia minor: Secondary | ICD-10-CM | POA: Diagnosis present

## 2022-05-08 DIAGNOSIS — J45909 Unspecified asthma, uncomplicated: Secondary | ICD-10-CM | POA: Diagnosis present

## 2022-05-08 DIAGNOSIS — Z87891 Personal history of nicotine dependence: Secondary | ICD-10-CM | POA: Diagnosis not present

## 2022-05-08 DIAGNOSIS — O34211 Maternal care for low transverse scar from previous cesarean delivery: Secondary | ICD-10-CM | POA: Diagnosis not present

## 2022-05-08 DIAGNOSIS — O134 Gestational [pregnancy-induced] hypertension without significant proteinuria, complicating childbirth: Secondary | ICD-10-CM | POA: Diagnosis present

## 2022-05-08 LAB — COMPREHENSIVE METABOLIC PANEL
ALT: 18 U/L (ref 0–44)
AST: 24 U/L (ref 15–41)
Albumin: 2.6 g/dL — ABNORMAL LOW (ref 3.5–5.0)
Alkaline Phosphatase: 125 U/L (ref 38–126)
Anion gap: 12 (ref 5–15)
BUN: 10 mg/dL (ref 6–20)
CO2: 19 mmol/L — ABNORMAL LOW (ref 22–32)
Calcium: 8.9 mg/dL (ref 8.9–10.3)
Chloride: 107 mmol/L (ref 98–111)
Creatinine, Ser: 0.72 mg/dL (ref 0.44–1.00)
GFR, Estimated: >60 mL/min (ref >60)
Glucose, Bld: 71 mg/dL (ref 70–99)
Potassium: 4.1 mmol/L (ref 3.5–5.1)
Sodium: 138 mmol/L (ref 135–145)
Total Bilirubin: 0.1 mg/dL — ABNORMAL LOW (ref 0.3–1.2)
Total Protein: 6.3 g/dL — ABNORMAL LOW (ref 6.5–8.1)

## 2022-05-08 LAB — CBC
HCT: 35.9 % — ABNORMAL LOW (ref 36.0–46.0)
Hemoglobin: 11.6 g/dL — ABNORMAL LOW (ref 12.0–15.0)
MCH: 26.5 pg (ref 26.0–34.0)
MCHC: 32.3 g/dL (ref 30.0–36.0)
MCV: 82 fL (ref 80.0–100.0)
Platelets: 228 10*3/uL (ref 150–400)
RBC: 4.38 MIL/uL (ref 3.87–5.11)
RDW: 15.5 % (ref 11.5–15.5)
WBC: 12.7 10*3/uL — ABNORMAL HIGH (ref 4.0–10.5)
nRBC: 0 % (ref 0.0–0.2)

## 2022-05-08 LAB — TYPE AND SCREEN
ABO/RH(D): O NEG
Antibody Screen: POSITIVE

## 2022-05-08 MED ORDER — SODIUM CHLORIDE 0.9 % IV SOLN
5.0000 10*6.[IU] | Freq: Once | INTRAVENOUS | Status: AC
  Administered 2022-05-08: 5 10*6.[IU] via INTRAVENOUS
  Filled 2022-05-08: qty 5

## 2022-05-08 MED ORDER — NIFEDIPINE ER OSMOTIC RELEASE 30 MG PO TB24
60.0000 mg | ORAL_TABLET | Freq: Every day | ORAL | Status: DC
  Administered 2022-05-09: 60 mg via ORAL
  Filled 2022-05-08: qty 2

## 2022-05-08 MED ORDER — TERBUTALINE SULFATE 1 MG/ML IJ SOLN: 0.2500 mg | Freq: Once | INTRAMUSCULAR | Status: DC | PRN

## 2022-05-08 MED ORDER — OXYCODONE-ACETAMINOPHEN 5-325 MG PO TABS: 2.0000 | ORAL_TABLET | ORAL | Status: DC | PRN

## 2022-05-08 MED ORDER — ONDANSETRON HCL 4 MG/2ML IJ SOLN: 4.0000 mg | Freq: Four times a day (QID) | INTRAMUSCULAR | Status: DC | PRN

## 2022-05-08 MED ORDER — LACTATED RINGERS IV SOLN: INTRAVENOUS | Status: DC

## 2022-05-08 MED ORDER — SOD CITRATE-CITRIC ACID 500-334 MG/5ML PO SOLN: 30.0000 mL | ORAL | Status: DC | PRN

## 2022-05-08 MED ORDER — OXYTOCIN-SODIUM CHLORIDE 30-0.9 UT/500ML-% IV SOLN
1.0000 m[IU]/min | INTRAVENOUS | Status: DC
  Administered 2022-05-08: 1 m[IU]/min via INTRAVENOUS

## 2022-05-08 MED ORDER — OXYCODONE-ACETAMINOPHEN 5-325 MG PO TABS: 1.0000 | ORAL_TABLET | ORAL | Status: DC | PRN

## 2022-05-08 MED ORDER — QUETIAPINE FUMARATE 50 MG PO TABS
50.0000 mg | ORAL_TABLET | Freq: Every day | ORAL | Status: DC
  Administered 2022-05-08: 50 mg via ORAL
  Filled 2022-05-08 (×2): qty 1

## 2022-05-08 MED ORDER — LIDOCAINE HCL (PF) 1 % IJ SOLN: 30.0000 mL | INTRAMUSCULAR | Status: DC | PRN

## 2022-05-08 MED ORDER — OXYTOCIN-SODIUM CHLORIDE 30-0.9 UT/500ML-% IV SOLN
2.5000 [IU]/h | INTRAVENOUS | Status: DC
  Filled 2022-05-08: qty 500

## 2022-05-08 MED ORDER — OXYTOCIN-SODIUM CHLORIDE 30-0.9 UT/500ML-% IV SOLN
1.0000 m[IU]/min | INTRAVENOUS | Status: DC
  Filled 2022-05-08: qty 500

## 2022-05-08 MED ORDER — ACETAMINOPHEN 325 MG PO TABS: 650.0000 mg | ORAL_TABLET | ORAL | Status: DC | PRN

## 2022-05-08 MED ORDER — FENTANYL CITRATE (PF) 100 MCG/2ML IJ SOLN
50.0000 ug | INTRAMUSCULAR | Status: DC | PRN
  Administered 2022-05-08 (×2): 50 ug via INTRAVENOUS
  Filled 2022-05-08: qty 2

## 2022-05-08 MED ORDER — OXYTOCIN BOLUS FROM INFUSION
333.0000 mL | Freq: Once | INTRAVENOUS | Status: AC
  Administered 2022-05-09: 333 mL via INTRAVENOUS

## 2022-05-08 MED ORDER — LACTATED RINGERS IV SOLN: 500.0000 mL | INTRAVENOUS | Status: DC | PRN

## 2022-05-08 MED ORDER — PENICILLIN G POT IN DEXTROSE 60000 UNIT/ML IV SOLN
3.0000 10*6.[IU] | INTRAVENOUS | Status: DC
  Administered 2022-05-08 – 2022-05-09 (×6): 3 10*6.[IU] via INTRAVENOUS
  Filled 2022-05-08 (×5): qty 50

## 2022-05-08 NOTE — H&P (Cosign Needed Addendum)
OBSTETRIC ADMISSION HISTORY AND PHYSICAL  [name] [name] is a 36 y.o. female [phone] with IUP at [redacted]w[redacted]d by LMP presenting for IOL in the setting of gHTN. She reports +FMs, No LOF, no VB, no blurry vision, headaches or peripheral edema, and RUQ pain.  She plans on breast feeding. She request BTL for birth control. She received her prenatal care at [facility]   Dating: By LMP --->  Estimated Date of Delivery: 05/27/22  Sono:    @[redacted]w[redacted]d , CWD, normal anatomy, cephalic presentation, anterior placenta, 452g, 63% EFW  @[redacted]w[redacted]d , CWD, normal anatomy, cephalic presentation, anterior placenta, 2403g, 69% EFW  Prenatal History/Complications:  - gHTN - Previous c-section - Morbid obesity - Rh neg - GBS pos - Bipolar/Anxiety/depression - Persistent UTI - Mild intermittent asthma  Past Medical History: Past Medical History:  Diagnosis Date   Abnormal Pap smear    Anxiety    Anxiety and depression 02/01/2022   Asthma    Bipolar 1 disorder ([place])    Depression    GERD (gastroesophageal reflux disease)    IUD (intrauterine device) in place 04/12/2013   Had IUD inserted 03/29/13 could not feel strings went to ER 9/18 and KUB saw IUD still could not see string, can't see now but seen in US   Kidney infection    Morbid obesity ([place])    Pregnancy    Pregnancy induced hypertension    UTI (urinary tract infection)     Past Surgical History: Past Surgical History:  Procedure Laterality Date   CESAREAN SECTION     HYSTEROSCOPY N/A 01/09/2021   Procedure: HYSTEROSCOPY;  Surgeon: Janyth Pupa, DO;  Location: [facility];  Service: Gynecology;  Laterality: N/A;   IUD REMOVAL N/A 01/09/2021   Procedure: INTRAUTERINE DEVICE (IUD) REMOVAL;  Surgeon: Janyth Pupa, DO;  Location: [facility];  Service: Gynecology;  Laterality: N/A;   TOOTH EXTRACTION      Obstetrical History: OB History     Gravida  5   Para  2   Term  1   Preterm  1   AB  2   Living  2      SAB  2   IAB  0   Ectopic  0    Multiple  0   Live Births  2           Social History Social History   Socioeconomic History   Marital status: Single    Spouse name: Not on file   Number of children: 2   Years of education: Not on file   Highest education level: Not on file  Occupational History   Not on file  Tobacco Use   Smoking status: Former    Types: Cigarettes    Quit date: 05/21/2007    Years since quitting: 14.9   Smokeless tobacco: Never  Vaping Use   Vaping Use: Never used  Substance and Sexual Activity   Alcohol use: Not Currently    Comment: one during pregnancy. Occasionally with drink whole bottle of liquor. Socially will have 2-3 drinks.   Drug use: Not Currently    Types: Marijuana, Cocaine    Comment: Prior to pregnancy ate edibles and 4-5 blunts per day but potentially more if drinking/playing video games.   Sexual activity: Yes    Partners: Male    Birth control/protection: None  Other Topics Concern   Not on file  Social History Narrative   Not on file   Social Determinants of Health   Financial  Resource Strain: Low Risk  (02/28/2022)   Overall Financial Resource Strain (CARDIA)    Difficulty of Paying Living Expenses: Not hard at all  Food Insecurity: No Food Insecurity (02/28/2022)   Hunger Vital Sign    Worried About Running Out of Food in the Last Year: Never true    Ran Out of Food in the Last Year: Never true  Transportation Needs: No Transportation Needs (02/28/2022)   PRAPARE - Hydrologist (Medical): No    Lack of Transportation (Non-Medical): No  Physical Activity: Sufficiently Active (02/28/2022)   Exercise Vital Sign    Days of Exercise per Week: 3 days    Minutes of Exercise per Session: 60 min  Stress: Stress Concern Present (02/28/2022)   [place]    Feeling of Stress : Rather much  Social Connections: Socially Isolated (02/28/2022)   Social Connection and  Isolation Panel [NHANES]    Frequency of Communication with Friends and Family: Twice a week    Frequency of Social Gatherings with Friends and Family: Once a week    Attends Religious Services: Never    Marine scientist or Organizations: No    Attends Music therapist: Never    Marital Status: Divorced    Family History: Family History  Problem Relation Age of Onset   Hypertension Father    Diabetes Father    Congestive Heart Failure Father    Diabetes Paternal Aunt    Diabetes Paternal Uncle    Hypertension Paternal Grandmother    Diabetes Paternal Grandmother    Diabetes Paternal Grandfather     Allergies: Allergies  Allergen Reactions   Shellfish Allergy Anaphylaxis    Throat swelling    Medications Prior to Admission  Medication Sig Dispense Refill Last Dose   acetaminophen (TYLENOL) 500 MG tablet Take 1,000 mg by mouth daily.      albuterol (VENTOLIN HFA) 108 (90 Base) MCG/ACT inhaler Inhale 2 puffs into the lungs every 6 (six) hours as needed for wheezing or shortness of breath. 8 g 0    aspirin EC 81 MG tablet Take 1 tablet (81 mg total) by mouth daily. Swallow whole. 30 tablet 11    Blood Pressure Monitor MISC For regular home bp monitoring during pregnancy Needs large cuff 1 each 0    cefadroxil (DURICEF) 500 MG capsule Take 500 mg by mouth 2 (two) times daily. (Patient not taking: Reported on 05/06/2022)      cetirizine (ZYRTEC) 10 MG tablet Take 1 tablet (10 mg total) by mouth daily. 90 tablet 0    Doxylamine-Pyridoxine (DICLEGIS) 10-10 MG TBEC 2 tabs q hs, if sx persist add 1 tab q am on day 3, if sx persist add 1 tab q afternoon on day 4 100 tablet 6    fluconazole (DIFLUCAN) 150 MG tablet Take 1 tablet today and repeat in 3 days (Patient not taking: Reported on 05/02/2022) 2 tablet 1    fluticasone (FLONASE) 50 MCG/ACT nasal spray Place 1 spray into both nostrils daily. 16 g 0    FOLIC ACID PO Take by mouth.      hydrOXYzine (VISTARIL) 25 MG  capsule Take 1 capsule (25 mg total) by mouth at bedtime as needed. 90 capsule 4    MELATONIN PO Take 24 mg by mouth daily at 6 (six) AM.      montelukast (SINGULAIR) 10 MG tablet TAKE 1 TABLET BY MOUTH AT BEDTIME 30  tablet 0    NIFEdipine (PROCARDIA XL) 60 MG 24 hr tablet Take 1 tablet (60 mg total) by mouth daily. 30 tablet 11    nitrofurantoin, macrocrystal-monohydrate, (MACROBID) 100 MG capsule Take 100 mg by mouth 2 (two) times daily.      Prenatal Vit-Fe Fumarate-FA (PRENATAL VITAMIN PO) Take by mouth.      QUEtiapine (SEROQUEL) 50 MG tablet Take half a tablet for one week at night. Then increase to 1 full tablet. 60 tablet 2    UNABLE TO FIND Med Name: maternal belly band/ support belt [redacted] weeks pregnant 1 each 0    Review of Systems   All systems reviewed and negative except as stated in HPI  Blood pressure (!) 129/52, pulse 98, temperature 98.4 F (36.9 C), last menstrual period 08/20/2021. General appearance: alert, no distress, and morbidly obese Lungs: clear to auscultation bilaterally Heart: regular rate and rhythm Abdomen: soft, non-tender; bowel sounds normal Extremities: no sign of DVT Presentation: cephalic Fetal monitoring: 130bpm, moderate variability, +15x15 accels, no decels Uterine activity: occasional Dilation: 1 Effacement (%): Thick  Prenatal labs: ABO, Rh: --/--/O NEG (10/18 1710) Antibody: POS (10/18 1710) Rubella: 20.80 (04/26 1155) RPR: Non Reactive (08/10 0824)  HBsAg: Negative (04/26 1155)  HIV: Non Reactive (08/10 0824)  GBS: Positive/-- (10/05 1300)  2 hr Glucola: 77/101/84 Genetic screening: insufficient fetal DNA Anatomy US: normal  Prenatal Transfer Tool  Maternal Diabetes: No Genetic Screening: Insufficient fetal DNA Maternal Ultrasounds/Referrals: Normal Fetal Ultrasounds or other Referrals:  None Maternal Substance Abuse:  No Significant Maternal Medications:  Meds include: Other: Procardia Significant Maternal Lab Results: Group B  Strep positive and Rh negative  Results for orders placed or performed during the hospital encounter of 05/08/22 (from the past 24 hour(s))  CBC   Collection Time: 05/08/22  5:10 PM  Result Value Ref Range   WBC 12.7 (H) 4.0 - 10.5 K/uL   RBC 4.38 3.87 - 5.11 MIL/uL   Hemoglobin 11.6 (L) 12.0 - 15.0 g/dL   HCT 35.9 (L) 36.0 - 46.0 %   MCV 82.0 80.0 - 100.0 fL   MCH 26.5 26.0 - 34.0 pg   MCHC 32.3 30.0 - 36.0 g/dL   RDW 15.5 11.5 - 15.5 %   Platelets 228 150 - 400 K/uL   nRBC 0.0 0.0 - 0.2 %  Type and screen   Collection Time: 05/08/22  5:10 PM  Result Value Ref Range   ABO/RH(D) O NEG    Antibody Screen POS    Sample Expiration      05/11/2022,2359 Performed at [facility], [place] [address], [place], [place] [address]    Antibody Identification PENDING     Patient Active Problem List   Diagnosis Date Noted   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   Severe episode of recurrent major depressive disorder, without psychotic features ([place]) 04/19/2022   PTSD (post-traumatic stress disorder) 04/19/2022   Gestational hypertension 02/14/2022   Asthma 01/08/2022   History of delivery of macrosomal infant 12/12/2021   Alpha thalassemia silent carrier 12/04/2021   Bipolar disorder ([place]) 11/14/2021   Generalized anxiety disorder 11/14/2021   Obesity in pregnancy, antepartum 11/14/2021   Previous cesarean section 11/14/2021   Heart palpitations 11/14/2021   History of pyelonephritis 11/08/2021   Supervision of high-risk pregnancy 11/08/2021   Rh negative state in antepartum period 11/08/2021   Assessment/Plan:  [name] [name] is a 36 y.o. [id number] at [redacted]w[redacted]d here for IOL in the setting of  gHTN  #Labor: FB and low dose Pitocin started #Pain: None currently #FWB: Cat I #ID: GBS pos > PCN #MOF: Breast #MOC: unsure #Circ[facility] Desires inpatient   Deloria Lair, DO  05/08/2022, 7:44 PM   Attestation of CNM Supervision of Resident: Evaluation and management  procedures were performed by the [facility] Medicine Resident under my supervision. I was immediately available for direct supervision, assistance and direction throughout this encounter.  I also confirm that I have verified the information documented in the resident's note, and that I have also personally reperformed the pertinent components of the physical exam and all of the medical decision making activities.  I have also made any necessary editorial changes.  Renee Harder, CNM 05/08/2022 7:53 PM

## 2022-05-08 NOTE — Progress Notes (Addendum)
Patient ID: [name] [name], female   DOB: Dec 19, 1985, 36 y.o.   MRN: [id number] Doing well, not feeling much pain now  Vitals:   05/08/22 1723 05/08/22 2023  BP: (!) 129/52 127/70  Pulse: 98 100  Temp: 98.4 F (36.9 C)    FHR reassuring UCs irregular, not tracing well  Continue plan of care   Seabron Spates, CNM

## 2022-05-09 ENCOUNTER — Inpatient Hospital Stay (HOSPITAL_COMMUNITY): Payer: Medicaid Other | Admitting: Anesthesiology

## 2022-05-09 ENCOUNTER — Encounter (HOSPITAL_COMMUNITY): Payer: Self-pay | Admitting: Obstetrics & Gynecology

## 2022-05-09 ENCOUNTER — Encounter: Payer: Medicaid Other | Admitting: Obstetrics & Gynecology

## 2022-05-09 ENCOUNTER — Other Ambulatory Visit: Payer: Medicaid Other

## 2022-05-09 DIAGNOSIS — O134 Gestational [pregnancy-induced] hypertension without significant proteinuria, complicating childbirth: Secondary | ICD-10-CM

## 2022-05-09 DIAGNOSIS — O34211 Maternal care for low transverse scar from previous cesarean delivery: Secondary | ICD-10-CM

## 2022-05-09 DIAGNOSIS — Z3A37 37 weeks gestation of pregnancy: Secondary | ICD-10-CM

## 2022-05-09 DIAGNOSIS — O99824 Streptococcus B carrier state complicating childbirth: Secondary | ICD-10-CM

## 2022-05-09 LAB — CBC
HCT: 33.4 % — ABNORMAL LOW (ref 36.0–46.0)
Hemoglobin: 10.6 g/dL — ABNORMAL LOW (ref 12.0–15.0)
MCH: 26.3 pg (ref 26.0–34.0)
MCHC: 31.7 g/dL (ref 30.0–36.0)
MCV: 82.9 fL (ref 80.0–100.0)
Platelets: 210 10*3/uL (ref 150–400)
RBC: 4.03 MIL/uL (ref 3.87–5.11)
RDW: 15.4 % (ref 11.5–15.5)
WBC: 12.5 10*3/uL — ABNORMAL HIGH (ref 4.0–10.5)
nRBC: 0 % (ref 0.0–0.2)

## 2022-05-09 LAB — RPR: RPR Ser Ql: NONREACTIVE

## 2022-05-09 MED ORDER — FERROUS SULFATE 325 (65 FE) MG PO TABS
325.0000 mg | ORAL_TABLET | ORAL | Status: DC
  Administered 2022-05-10: 325 mg via ORAL
  Filled 2022-05-09: qty 1

## 2022-05-09 MED ORDER — SENNOSIDES-DOCUSATE SODIUM 8.6-50 MG PO TABS
2.0000 | ORAL_TABLET | ORAL | Status: DC
  Filled 2022-05-09: qty 2

## 2022-05-09 MED ORDER — SERTRALINE HCL 25 MG PO TABS
25.0000 mg | ORAL_TABLET | Freq: Every day | ORAL | Status: DC
  Administered 2022-05-10 (×2): 25 mg via ORAL
  Filled 2022-05-09 (×2): qty 1

## 2022-05-09 MED ORDER — METHYLERGONOVINE MALEATE 0.2 MG/ML IJ SOLN: 0.2000 mg | INTRAMUSCULAR | Status: DC | PRN

## 2022-05-09 MED ORDER — ONDANSETRON HCL 4 MG PO TABS: 4.0000 mg | ORAL_TABLET | ORAL | Status: DC | PRN

## 2022-05-09 MED ORDER — SIMETHICONE 80 MG PO CHEW
80.0000 mg | CHEWABLE_TABLET | ORAL | Status: DC | PRN
  Administered 2022-05-11: 80 mg via ORAL
  Filled 2022-05-09: qty 1

## 2022-05-09 MED ORDER — MEASLES, MUMPS & RUBELLA VAC IJ SOLR: 0.5000 mL | Freq: Once | INTRAMUSCULAR | Status: DC

## 2022-05-09 MED ORDER — QUETIAPINE FUMARATE ER 50 MG PO TB24
50.0000 mg | ORAL_TABLET | Freq: Every day | ORAL | Status: DC
  Administered 2022-05-10 (×2): 50 mg via ORAL
  Filled 2022-05-09 (×3): qty 1

## 2022-05-09 MED ORDER — DIPHENHYDRAMINE HCL 25 MG PO CAPS: 25.0000 mg | ORAL_CAPSULE | Freq: Four times a day (QID) | ORAL | Status: DC | PRN

## 2022-05-09 MED ORDER — BENZOCAINE-MENTHOL 20-0.5 % EX AERO: 1.0000 | INHALATION_SPRAY | CUTANEOUS | Status: DC | PRN

## 2022-05-09 MED ORDER — DIBUCAINE (PERIANAL) 1 % EX OINT: 1.0000 | TOPICAL_OINTMENT | CUTANEOUS | Status: DC | PRN

## 2022-05-09 MED ORDER — IBUPROFEN 600 MG PO TABS
600.0000 mg | ORAL_TABLET | Freq: Four times a day (QID) | ORAL | Status: DC
  Administered 2022-05-10 – 2022-05-11 (×8): 600 mg via ORAL
  Filled 2022-05-09 (×8): qty 1

## 2022-05-09 MED ORDER — EPHEDRINE 5 MG/ML INJ: 10.0000 mg | INTRAVENOUS | Status: DC | PRN

## 2022-05-09 MED ORDER — TETANUS-DIPHTH-ACELL PERTUSSIS 5-2.5-18.5 LF-MCG/0.5 IM SUSY: 0.5000 mL | PREFILLED_SYRINGE | Freq: Once | INTRAMUSCULAR | Status: DC

## 2022-05-09 MED ORDER — MEDROXYPROGESTERONE ACETATE 150 MG/ML IM SUSP: 150.0000 mg | INTRAMUSCULAR | Status: DC | PRN

## 2022-05-09 MED ORDER — LIDOCAINE-EPINEPHRINE (PF) 2 %-1:200000 IJ SOLN
INTRAMUSCULAR | Status: DC | PRN
  Administered 2022-05-09: 5 mL via EPIDURAL

## 2022-05-09 MED ORDER — FLEET ENEMA 7-19 GM/118ML RE ENEM: 1.0000 | ENEMA | Freq: Every day | RECTAL | Status: DC | PRN

## 2022-05-09 MED ORDER — BISACODYL 10 MG RE SUPP: 10.0000 mg | Freq: Every day | RECTAL | Status: DC | PRN

## 2022-05-09 MED ORDER — PHENYLEPHRINE 80 MCG/ML (10ML) SYRINGE FOR IV PUSH (FOR BLOOD PRESSURE SUPPORT): 80.0000 ug | PREFILLED_SYRINGE | INTRAVENOUS | Status: DC | PRN

## 2022-05-09 MED ORDER — ACETAMINOPHEN 325 MG PO TABS
650.0000 mg | ORAL_TABLET | ORAL | Status: DC | PRN
  Administered 2022-05-10 – 2022-05-11 (×3): 650 mg via ORAL
  Filled 2022-05-09 (×4): qty 2

## 2022-05-09 MED ORDER — COCONUT OIL OIL: 1.0000 | TOPICAL_OIL | Status: DC | PRN

## 2022-05-09 MED ORDER — METHYLERGONOVINE MALEATE 0.2 MG PO TABS: 0.2000 mg | ORAL_TABLET | ORAL | Status: DC | PRN

## 2022-05-09 MED ORDER — ONDANSETRON HCL 4 MG/2ML IJ SOLN: 4.0000 mg | INTRAMUSCULAR | Status: DC | PRN

## 2022-05-09 MED ORDER — WITCH HAZEL-GLYCERIN EX PADS: 1.0000 | MEDICATED_PAD | CUTANEOUS | Status: DC | PRN

## 2022-05-09 MED ORDER — DIPHENHYDRAMINE HCL 50 MG/ML IJ SOLN: 12.5000 mg | INTRAMUSCULAR | Status: DC | PRN

## 2022-05-09 MED ORDER — LACTATED RINGERS IV SOLN: 500.0000 mL | Freq: Once | INTRAVENOUS | Status: DC

## 2022-05-09 MED ORDER — FENTANYL-BUPIVACAINE-NACL 0.5-0.125-0.9 MG/250ML-% EP SOLN
12.0000 mL/h | EPIDURAL | Status: DC | PRN
  Administered 2022-05-09: 12 mL/h via EPIDURAL
  Filled 2022-05-09 (×2): qty 250

## 2022-05-09 MED ORDER — PRENATAL MULTIVITAMIN CH
1.0000 | ORAL_TABLET | Freq: Every day | ORAL | Status: DC
  Administered 2022-05-10: 1 via ORAL
  Filled 2022-05-09 (×2): qty 1

## 2022-05-09 NOTE — Anesthesia Preprocedure Evaluation (Signed)
Anesthesia Evaluation    Airway Mallampati: III  TM Distance: >3 FB Neck ROM: Full    Dental no notable dental hx.    Pulmonary asthma , former smoker,    Pulmonary exam normal breath sounds clear to auscultation       Cardiovascular hypertension, Pt. on medications Normal cardiovascular exam Rhythm:Regular Rate:Normal     Neuro/Psych PSYCHIATRIC DISORDERS Anxiety Depression Bipolar Disorder    GI/Hepatic GERD  ,  Endo/Other  Morbid obesity (BMI 57)  Renal/GU      Musculoskeletal   Abdominal   Peds  Hematology   Anesthesia Other Findings IOL for gHTN  Reproductive/Obstetrics (+) Pregnancy TOLAC                            Anesthesia Physical Anesthesia Plan  ASA: 3  Anesthesia Plan: Epidural   Post-op Pain Management:    Induction:   PONV Risk Score and Plan: Treatment may vary due to age or medical condition  Airway Management Planned: Natural Airway  Additional Equipment:   Intra-op Plan:   Post-operative Plan:   Informed Consent: I have reviewed the patients History and Physical, chart, labs and discussed the procedure including the risks, benefits and alternatives for the proposed anesthesia with the patient or authorized representative who has indicated his/her understanding and acceptance.       Plan Discussed with: Anesthesiologist  Anesthesia Plan Comments: (Patient identified. Risks, benefits, options discussed with patient including but not limited to bleeding, infection, nerve damage, paralysis, failed block, incomplete pain control, headache, blood pressure changes, nausea, vomiting, reactions to medication, itching, and post partum back pain. Confirmed with bedside nurse the patient's most recent platelet count. Confirmed with the patient that they are not taking any anticoagulation, have any bleeding history or any family history of bleeding disorders. Patient  expressed understanding and wishes to proceed. All questions were answered. )        Anesthesia Quick Evaluation

## 2022-05-09 NOTE — Progress Notes (Signed)
Patient ID: [name] [name], female   DOB: 1985-10-28, 36 y.o.   MRN: [id number] Having some pain with contractions Would like an epidural  Vitals:   05/08/22 1723 05/08/22 2023  BP: (!) 129/52 127/70  Pulse: 98 100  Temp: 98.4 F (36.9 C)    FHR tracing intermittently due to pt lying prone  UCs q2-104min  Dilation: 3 Effacement (%): 70 Cervical Position: Anterior Station: Ballotable Presentation: Vertex Exam by:: Carmelia Roller, CNM  WIll get her an epidural and recheck her cervix more completely (was intolerant of exam due to pain)  Would like to be able to AROM and put internal monitors if possible

## 2022-05-09 NOTE — Progress Notes (Signed)
Patient ID: [name] [name], female   DOB: November 07, 1985, 36 y.o.   MRN: [id number] Doing well  Vitals:   05/08/22 1723 05/08/22 2023  BP: (!) 129/52 127/70  Pulse: 98 100  Temp: 98.4 F (36.9 C)    Pitocin up to 56mu/min  FHR reassuring UCs very spaced out, inadequate  Cervix exam deferred  Plan to continue increasing Pitocin

## 2022-05-09 NOTE — Progress Notes (Signed)
Late Entry due to labor and delivery acuity [name] [name] is a 36 y.o. [id number] at [redacted]w[redacted]d by LMP admitted for induction of labor due to [facility].  Subjective: Patient feeling better after light clear liquid breakfast and ready to continue with IOL.   Objective: BP 126/67   Pulse 100   Temp 98 F (36.7 C) (Oral)   LMP 08/20/2021   SpO2 99%  No intake/output data recorded. Total I/O In: -  Out: 4540 [Urine:2475]  FHT:  FHR: 130 bpm, variability: moderate,  accelerations:  Present,  decelerations:  Absent UC:   irregular, every 5-7 minutes SVE:   Dilation: 5.5 Effacement (%): 70 Station: -3 Exam by:: Doloris Hall, RN  Labs: Lab Results  Component Value Date   WBC 12.5 (H) 05/09/2022   HGB 10.6 (L) 05/09/2022   HCT 33.4 (L) 05/09/2022   MCV 82.9 05/09/2022   PLT 210 05/09/2022   CNM to bedside to attempt AROM. Reviewed risks and benefits of AROM, FSE and IUPC placement with patient. Patient agreeable to Plan of care.  Fetal head anteriorly well applied to cervix. Due to fetal positioning slow leak AROM with FSE attempted. Contraction at same time of placement and complete AROM with copious amounts of clear fluid @ 1130 noted. FSE flooded into bed with fluid and not applied. IUPC placed with minimal difficulty. 2nd attempt with FSE unsuccessful as well. External monitors applied and monitoring.   Assessment / Plan: Induction of labor due to gHTN,  progressing well on pitocin  Labor: Progressing normally s/p AROM clear fluid on Pit 2x2. Continue to titrate Pit appropriately as needed.  Preeclampsia:  labs stable- Continue to monitor hypertension.  Fetal Wellbeing:  Category II- 2 variables noted post AROM. Recovered well. Continue to monitor for signs of fetal distress.  Pain Control:  Epidural I/D:   GBS Positive adequately treated with PCN.  Anticipated MOD:   Anticipated VBAC at this time.   Jacquiline Doe, CNM 05/09/2022, 3:17 PM

## 2022-05-09 NOTE — Anesthesia Procedure Notes (Signed)
Epidural Patient location during procedure: OB Start time: 05/09/2022 4:45 AM End time: 05/09/2022 4:55 AM  Staffing Anesthesiologist: Freddrick March, MD Performed: anesthesiologist   Preanesthetic Checklist Completed: patient identified, IV checked, risks and benefits discussed, monitors and equipment checked, pre-op evaluation and timeout performed  Epidural Patient position: sitting Prep: DuraPrep and site prepped and draped Patient monitoring: continuous pulse ox, blood pressure, heart rate and cardiac monitor Approach: midline Location: L3-L4 Injection technique: LOR air  Needle:  Needle type: Tuohy  Needle gauge: 17 G Needle length: 9 cm Needle insertion depth: 10 cm Catheter type: closed end flexible Catheter size: 19 Gauge Catheter at skin depth: 16 cm Test dose: negative  Assessment Sensory level: T8 Events: blood not aspirated, injection not painful, no injection resistance, no paresthesia and negative IV test  Additional Notes Patient identified. Risks/Benefits/Options discussed with patient including but not limited to bleeding, infection, nerve damage, paralysis, failed block, incomplete pain control, headache, blood pressure changes, nausea, vomiting, reactions to medication both or allergic, itching and postpartum back pain. Confirmed with bedside nurse the patient's most recent platelet count. Confirmed with patient that they are not currently taking any anticoagulation, have any bleeding history or any family history of bleeding disorders. Patient expressed understanding and wished to proceed. All questions were answered. Sterile technique was used throughout the entire procedure. Please see nursing notes for vital signs. Test dose was given through epidural catheter and negative prior to continuing to dose epidural or start infusion. Warning signs of high block given to the patient including shortness of breath, tingling/numbness in hands, complete motor  block, or any concerning symptoms with instructions to call for help. Patient was given instructions on fall risk and not to get out of bed. All questions and concerns addressed with instructions to call with any issues or inadequate analgesia.  Reason for block:procedure for pain

## 2022-05-09 NOTE — Progress Notes (Signed)
Late Entry due to Labor and Delivery acuity:   [name] [name] is a 36 y.o. [id number] at [redacted]w[redacted]d by LMP admitted for induction of labor due to gestational HTN. Hx of C/s.   Subjective: Patient resting well with epidural and is requesting breakfast. Brief introductions exchanged   Objective: BP 115/87   Pulse 93   Temp 98 F (36.7 C) (Oral)   LMP 08/20/2021   SpO2 99%  No intake/output data recorded. No intake/output data recorded.  FHT:  FHR: 135 bpm, variability: moderate,  accelerations:  Present,  decelerations:  Absent UC:   irregular, and difficult to trace.  SVE:   Dilation: 4 Effacement (%): 70 Station: Ballotable Exam by:: Doloris Hall, RN  Labs: Lab Results  Component Value Date   WBC 12.5 (H) 05/09/2022   HGB 10.6 (L) 05/09/2022   HCT 33.4 (L) 05/09/2022   MCV 82.9 05/09/2022   PLT 210 05/09/2022  Patient Vitals for the past 24 hrs:  BP Temp Temp src Pulse SpO2  05/09/22 1430 126/67 -- -- 100 --  05/09/22 1402 123/73 -- -- 97 --  05/09/22 1332 (!) 108/55 -- -- 97 --  05/09/22 1302 136/68 -- -- 89 --  05/09/22 1241 138/67 -- -- 96 --  05/09/22 1132 130/71 -- -- 93 --  05/09/22 1102 132/77 -- -- 99 --  05/09/22 1032 128/84 -- -- 96 --  05/09/22 1002 (!) 111/53 -- -- 87 --  05/09/22 0932 115/87 -- -- 93 --  05/09/22 0902 128/74 -- -- 88 --  05/09/22 0832 116/76 98 F (36.7 C) Oral 90 --  05/09/22 0802 131/77 -- -- 88 --  05/09/22 0732 138/64 -- -- 93 --  05/09/22 0702 128/72 -- -- 88 --  05/09/22 0632 127/73 -- -- 86 --  05/09/22 0602 134/70 -- -- 90 --  05/09/22 0542 124/73 -- -- 94 --  05/09/22 0541 -- -- -- -- 99 %  05/09/22 0537 121/77 -- -- 96 --  05/09/22 0531 109/71 -- -- 94 99 %  05/09/22 0523 (!) 97/47 -- -- 86 --  05/09/22 0517 (!) 126/56 -- -- (!) 105 --  05/09/22 0516 -- -- -- -- 99 %  05/09/22 0513 (!) 123/53 -- -- 100 --  05/09/22 0508 (!) 128/49 -- -- (!) 110 --  05/09/22 0506 -- -- -- -- 99 %  05/09/22 0503 132/63 -- -- (!) 106 --   05/09/22 0501 -- -- -- -- 99 %  05/09/22 0459 126/75 -- -- 89 99 %  05/09/22 0456 126/75 -- -- 89 99 %  05/09/22 0425 (!) 109/57 -- -- 89 --  05/08/22 2023 127/70 -- -- 100 --  05/08/22 1723 (!) 129/52 98.4 F (36.9 C) -- 98 --     Assessment / Plan: Induction of labor due to gHTN ,  progressing well on pitocin  Labor:  Allowing patient a clear liquid breakfast with broth and ICEEs and will assess for AROM and possibly IUPC and FSE.    Preeclampsia:  labs stable Fetal Wellbeing:  Category I- Continue to monitor for signs of fetal distress.  Pain Control:  Epidural- continue with current use.  I/D:   GBS Positive on PCN Anticipated MOD:  Hopeful for VBAC  Jacquiline Doe, CNM 05/09/2022, 9:54 AM

## 2022-05-09 NOTE — Lactation Note (Signed)
This note was copied from a baby's chart. Lactation Consultation Note  Patient Name: [name] [name] [id number] Date: 05/09/2022 Reason for consult: Initial assessment;Early term 37-38.6wks Age:34 hours Has patient been taught Hand Expression?: Yes Does the patient have breastfeeding experience prior to this delivery?: Yes How long did the patient breastfeed?: BF her 1st child a little while but not her 2nd one.he wouldn't take her BM that she pumped. Mom will need DEBP to pump to get BM to give to baby. Breast tissue is not compressible enough for baby to maintain latch. NS not an option. Mom will have to give formula or DBM until her milk comes in. Mom is fine with that and states understanding.  Maternal Data   Feeding    LATCH Score Latch: Repeated attempts needed to sustain latch, nipple held in mouth throughout feeding, stimulation needed to elicit sucking reflex.  Audible Swallowing: None  Type of Nipple: Inverted  Comfort (Breast/Nipple): Filling, red/small blisters or bruises, mild/mod discomfort (edema)  Hold (Positioning): Full assist, staff holds infant at breast  LATCH Score: 2   Lactation Tools Discussed/Used    Interventions Interventions: Adjust position;Assisted with latch;Support pillows;Skin to skin;Position options;Breast massage;Hand express;Breast compression;Reverse pressure  Discharge    Consult Status Consult Status: Follow-up from L&D Date: 05/10/22 Follow-up type: In-patient    Theodoro Kalata 05/09/2022, 10:43 PM

## 2022-05-09 NOTE — Discharge Summary (Signed)
Postpartum Discharge Summary  Date of Service updated-10/21     Patient Name: [name] [name] DOB: 01/25/1986 MRN: [id number]  Date of admission: 05/08/2022 Delivery date:05/09/2022  Delivering provider: Christin Fudge  Date of discharge: 05/11/2022  Admitting diagnosis: Gestational hypertension [O13.9] Intrauterine pregnancy: [redacted]w[redacted]d     Secondary diagnosis:  Principal Problem:   Gestational hypertension  Additional problems: Chronic HTN ( HTN dx at 16 weeks); Anxiety/depression; Hx Cesarean section; Rh neg    Discharge diagnosis: Term Pregnancy Delivered, VBAC, and CHTN                                              Post partum procedures: none Augmentation: AROM, Pitocin, and IP Foley Complications: None  Hospital course: Induction of Labor With Vaginal Delivery   36 y.o. yo [id number] at [redacted]w[redacted]d was admitted to the hospital 05/08/2022 for induction of labor.  Indication for induction:  CHTN .  Patient had an labor course complicated by nothing Membrane Rupture Time/Date: 11:30 AM ,05/09/2022   Delivery Method:Vaginal, Spontaneous  Episiotomy: None  Lacerations:  None  Details of delivery can be found in separate delivery note.  Patient had a uncomplicated postpartum course.  Due to gestational vs chronic HTN she was sent home on Lasix with plans for close outpatient monitoring. Patient is discharged home 05/11/22.  Newborn Data: Birth date:05/09/2022  Birth time:9:30 PM  Gender:Female  Living status:Living  Apgars:8 ,9  Weight:3470 g   Magnesium Sulfate received: No BMZ received: No Rhophylac:No MMR:N/A T-DaP:Given prenatally Flu: No Transfusion:No  Physical exam  Vitals:   05/10/22 0955 05/10/22 1300 05/10/22 1934 05/11/22 0627  BP: 104/67 (!) 102/56 125/66 130/77  Pulse: 74 86 91 90  Resp: $Remo'16 16 18 16  'knkwg$ Temp: 97.7 F (36.5 C) 97.6 F (36.4 C) 98.4 F (36.9 C) 98 F (36.7 C)  TempSrc: Oral Oral Oral Oral  SpO2: 100% 98% 99% 100%   General:  alert, cooperative, and no distress Lochia: appropriate Uterine Fundus: firm Incision: N/A DVT Evaluation: No evidence of DVT seen on physical exam. Labs: Lab Results  Component Value Date   WBC 12.5 (H) 05/09/2022   HGB 10.6 (L) 05/09/2022   HCT 33.4 (L) 05/09/2022   MCV 82.9 05/09/2022   PLT 210 05/09/2022      Latest Ref Rng & Units 05/08/2022    5:10 PM  CMP  Glucose 70 - 99 mg/dL 71   BUN 6 - 20 mg/dL 10   Creatinine 0.44 - 1.00 mg/dL 0.72   Sodium 135 - 145 mmol/L 138   Potassium 3.5 - 5.1 mmol/L 4.1   Chloride 98 - 111 mmol/L 107   CO2 22 - 32 mmol/L 19   Calcium 8.9 - 10.3 mg/dL 8.9   Total Protein 6.5 - 8.1 g/dL 6.3   Total Bilirubin 0.3 - 1.2 mg/dL 0.1   Alkaline Phos 38 - 126 U/L 125   AST 15 - 41 U/L 24   ALT 0 - 44 U/L 18    Edinburgh Score:    05/10/2022    4:00 PM  Edinburgh Postnatal Depression Scale Screening Tool  I have been able to laugh and see the funny side of things. 2  I have looked forward with enjoyment to things. 2  I have blamed myself unnecessarily when things went wrong. 2  I have been anxious or worried  for no good reason. 2  I have felt scared or panicky for no good reason. 3  Things have been getting on top of me. 3  I have been so unhappy that I have had difficulty sleeping. 3  I have felt sad or miserable. 3  I have been so unhappy that I have been crying. 3  The thought of harming myself has occurred to me. 0  Edinburgh Postnatal Depression Scale Total 23     After visit meds:  Allergies as of 05/11/2022       Reactions   Shellfish Allergy Anaphylaxis   Throat swelling        Medication List     STOP taking these medications    aspirin EC 81 MG tablet   cefadroxil 500 MG capsule Commonly known as: DURICEF   fluconazole 150 MG tablet Commonly known as: Diflucan   nitrofurantoin (macrocrystal-monohydrate) 100 MG capsule Commonly known as: MACROBID       TAKE these medications    acetaminophen 500 MG  tablet Commonly known as: TYLENOL Take 1,000 mg by mouth daily.   albuterol 108 (90 Base) MCG/ACT inhaler Commonly known as: VENTOLIN HFA Inhale 2 puffs into the lungs every 6 (six) hours as needed for wheezing or shortness of breath.   Blood Pressure Monitor Misc For regular home bp monitoring during pregnancy Needs large cuff   cetirizine 10 MG tablet Commonly known as: ZYRTEC Take 1 tablet (10 mg total) by mouth daily.   Doxylamine-Pyridoxine 10-10 MG Tbec Commonly known as: Diclegis 2 tabs q hs, if sx persist add 1 tab q am on day 3, if sx persist add 1 tab q afternoon on day 4   fluticasone 50 MCG/ACT nasal spray Commonly known as: FLONASE Place 1 spray into both nostrils daily.   FOLIC ACID PO Take by mouth.   furosemide 20 MG tablet Commonly known as: Lasix Take 1 tablet (20 mg total) by mouth daily.   hydrOXYzine 25 MG capsule Commonly known as: Vistaril Take 1 capsule (25 mg total) by mouth at bedtime as needed.   ibuprofen 600 MG tablet Commonly known as: ADVIL Take 1 tablet (600 mg total) by mouth every 6 (six) hours.   MELATONIN PO Take 24 mg by mouth daily at 6 (six) AM.   montelukast 10 MG tablet Commonly known as: SINGULAIR TAKE 1 TABLET BY MOUTH AT BEDTIME   NIFEdipine 60 MG 24 hr tablet Commonly known as: Procardia XL Take 1 tablet (60 mg total) by mouth daily.   norethindrone 0.35 MG tablet Commonly known as: Ortho Micronor Take 1 tablet (0.35 mg total) by mouth daily.   PRENATAL VITAMIN PO Take by mouth.   QUEtiapine 50 MG tablet Commonly known as: SEROQUEL Take half a tablet for one week at night. Then increase to 1 full tablet.   sertraline 25 MG tablet Commonly known as: ZOLOFT Take 1 tablet (25 mg total) by mouth at bedtime.   UNABLE TO FIND Med Name: maternal belly band/ support belt [redacted] weeks pregnant         Discharge home in stable condition Infant Feeding: Bottle Infant Disposition:home with mother Discharge  instruction: per After Visit Summary and Postpartum booklet. Activity: Advance as tolerated. Pelvic rest for 6 weeks.  Diet: routine diet Future Appointments: Future Appointments  Date Time Provider [place]  05/16/2022  9:30 AM Jacquelynn Cree, MD [facility] None  05/16/2022  2:30 PM [facility] NURSE [facility]  05/21/2022  9:00 AM Eulas Post,  Elvis Coil, LCSW [facility] None  06/06/2022 10:30 AM Florian Buff, MD [facility]  10/09/2022  3:00 PM Freada Bergeron, MD [facility]   Follow up Visit:  Follow-up Information     [facility] Follow up.   Specialty: Obstetrics and Gynecology Why: Please follow up in one week as scheduled Contact information: [address] [place] [phone]                 Please schedule this patient for a In person postpartum visit in 4 weeks with the following provider: MD.(So she can schedule her BTL) Additional Postpartum F/U:BP check 1 week  Low risk pregnancy complicated by: HTN Delivery mode:  Vaginal, Spontaneous  Anticipated Birth Control: Reviewed contraception- desires POPs for now   05/11/2022 Annalee Genta, DO

## 2022-05-10 ENCOUNTER — Other Ambulatory Visit (HOSPITAL_COMMUNITY): Payer: Self-pay

## 2022-05-10 MED ORDER — FUROSEMIDE 20 MG PO TABS
20.0000 mg | ORAL_TABLET | Freq: Every day | ORAL | 0 refills | Status: DC
  Filled 2022-05-10: qty 5, 5d supply, fill #0

## 2022-05-10 MED ORDER — IBUPROFEN 600 MG PO TABS
600.0000 mg | ORAL_TABLET | Freq: Four times a day (QID) | ORAL | 0 refills | Status: DC
  Filled 2022-05-10: qty 30, 8d supply, fill #0

## 2022-05-10 MED ORDER — RHO D IMMUNE GLOBULIN 1500 UNIT/2ML IJ SOSY
300.0000 ug | PREFILLED_SYRINGE | Freq: Once | INTRAMUSCULAR | Status: AC
  Administered 2022-05-10: 300 ug via INTRAVENOUS
  Filled 2022-05-10: qty 2

## 2022-05-10 NOTE — Social Work (Signed)
CSW acknowledged consult and completed a clinical assessment.  There are no barriers to d/c.  Clinical assessment notes will be entered at a later time.  Nickie Warwick, LCSWA Clinical Social Worker [phone]  

## 2022-05-10 NOTE — Anesthesia Postprocedure Evaluation (Signed)
Anesthesia Post Note  Patient: [name] [name]  Procedure(s) Performed: AN AD HOC LABOR EPIDURAL     Patient location during evaluation: Mother Baby Anesthesia Type: Epidural Level of consciousness: awake and alert Pain management: pain level controlled Vital Signs Assessment: post-procedure vital signs reviewed and stable Respiratory status: spontaneous breathing, nonlabored ventilation and respiratory function stable Cardiovascular status: stable Postop Assessment: no headache, no backache, epidural receding, no apparent nausea or vomiting, patient able to bend at knees, adequate PO intake and able to ambulate Anesthetic complications: no   No notable events documented.  Last Vitals:  Vitals:   05/10/22 0052 05/10/22 0550  BP: 124/72 116/64  Pulse: 95 89  Resp: 16 14  Temp: 36.6 C 36.7 C  SpO2: 99% 99%    Last Pain:  Vitals:   05/10/22 0800  TempSrc:   PainSc: Asleep   Pain Goal:                   [employer]

## 2022-05-10 NOTE — Social Work (Signed)
CSW attempted to meet with MOB, MOB requested CSW to come back later, CSW will attempt to see MOB at a later time.  [name] [name], [place] Social Worker [phone]

## 2022-05-10 NOTE — Progress Notes (Signed)
Post Partum Day 1 Subjective: no complaints, up ad lib, voiding and tolerating PO, small lochia, plans to bottle feed,  unsure about BC,  probably won't be BTL  Objective: Blood pressure 116/64, pulse 89, temperature 98 F (36.7 C), temperature source Oral, resp. rate 14, last menstrual period 08/20/2021, SpO2 99 %, unknown if currently breastfeeding.  Physical Exam:  General: alert, cooperative and no distress Lochia:normal flow Chest: CTAB Heart: RRR no m/r/g Abdomen: +BS, soft, nontender,  Uterine Fundus: firm DVT Evaluation: No evidence of DVT seen on physical exam. Extremities: 1+ edema  Recent Labs    05/08/22 1710 05/09/22 0305  HGB 11.6* 10.6*  HCT 35.9* 33.4*    Assessment/Plan: CHTN vs GHTN (dx at 16 weeks), BPs normal postpartum Start Lasix Plan DC tomorrow   LOS: 2 days   [name] [name] 05/10/2022, 7:51 AM

## 2022-05-10 NOTE — Progress Notes (Signed)
Rhogam given Lot # [id number], expiration February 10, 2024

## 2022-05-10 NOTE — Lactation Note (Signed)
This note was copied from a baby's chart. Lactation Consultation Note  Patient Name: [name] [name] HQPRF'F Date: 05/10/2022 Reason for consult: Initial assessment;Early term 37-38.6wks Age:34 hours Mom is going to pump and bottle feed for a while until her milk comes in and see if her nipples evert any from pumping and wearing shells. If they do mom would like to latch baby. If not she will cont. To pump and bottle feed DBM. Newborn feeding habits, STS, I&O,supply and demand reviewed. Mom encouraged to feed baby 8-12 times/24 hours and with feeding cues.   RN set up DEBP. LC reviewed how to use the DEBP. Mom was pumping when LC left. Feeding sheet on how much to give baby if not putting baby to the breast. Encouraged to call  for assistance.  Maternal Data Has patient been taught Hand Expression?: Yes Does the patient have breastfeeding experience prior to this delivery?: Yes How long did the patient breastfeed?: BF her 1st child a little while but not her 2nd one.he wouldn't take her BM that she pumped.  Feeding Mother's Current Feeding Choice: Breast Milk and Donor Milk  LATCH Score Latch: Repeated attempts needed to sustain latch, nipple held in mouth throughout feeding, stimulation needed to elicit sucking reflex.  Audible Swallowing: None  Type of Nipple: Inverted  Comfort (Breast/Nipple): Filling, red/small blisters or bruises, mild/mod discomfort (edema)  Hold (Positioning): Full assist, staff holds infant at breast  LATCH Score: 2   Lactation Tools Discussed/Used Tools: Pump;Flanges Flange Size: 24 Breast pump type: Double-Electric Breast Pump Pump Education: Setup, frequency, and cleaning;Milk Storage Reason for Pumping: inverted/flat non-compressible nipples Pumping frequency: q3h Pumped volume: 0 mL  Interventions Interventions: Breast feeding basics reviewed;DEBP;Shells;LC Services brochure  Discharge    Consult Status Consult Status:  Follow-up Date: 05/11/22 Follow-up type: In-patient    Jim Philemon, [place] 05/10/2022, 1:46 AM

## 2022-05-11 LAB — RH IG WORKUP (INCLUDES ABO/RH)
Fetal Screen: NEGATIVE
Gestational Age(Wks): 37
Unit division: 0

## 2022-05-11 MED ORDER — NORETHINDRONE 0.35 MG PO TABS: 1.0000 | ORAL_TABLET | Freq: Every day | ORAL | 4 refills | Status: DC

## 2022-05-11 MED ORDER — SERTRALINE HCL 25 MG PO TABS: 25.0000 mg | ORAL_TABLET | Freq: Every day | ORAL | 6 refills | Status: DC

## 2022-05-11 NOTE — Clinical Social Work Maternal (Signed)
CLINICAL SOCIAL WORK MATERNAL/CHILD NOTE  Patient Details  Name: [name] [name] MRN: [id number] Date of Birth: 21-Apr-1986  Date:  05/11/2022  Clinical Social Worker Initiating Note:  Letta Kocher, LCSWA Date/Time: Initiated:  05/10/22/1530     Child's Name:  [name]   Biological Parents:  Mother, Father [name] [name] 07-14-1988, [name] [name] 08/23/1985)   Need for Interpreter:  None   Reason for Referral:  Behavioral Health Concerns   Address:  [place] [address]    Phone number:  [phone] (home)     Additional phone number:   Household Members/Support Persons (HM/SP):   Household Member/Support Person 1, Household Member/Support Person 2   HM/SP Name Relationship DOB or Age  HM/SP -1 Clint Graves siginificant other 08/23/1985  HM/SP -2 Clint Graves Jr. son 06/15/2012  HM/SP -3        HM/SP -4        HM/SP -5        HM/SP -6        HM/SP -7        HM/SP -8          Natural Supports (not living in the home):      Professional Supports:     Employment: Animator   Type of Work: Ambulance person   Education:  [place] arranged:    Museum/gallery curator Resources:  [employer]   Other Resources:  Physicist, medical  , [place] Considerations Which May Impact Care:    Strengths:  Ability to meet basic needs  , Home prepared for child     Psychotropic Medications:         Pediatrician:       Pediatrician List:   [place]      Pediatrician Fax Number:    Risk Factors/Current Problems:  Mental Health Concerns     Cognitive State:  Able to Concentrate  , Alert     Mood/Affect:  Calm  , Comfortable     CSW Assessment: CSW received consult for hx of Anxiety, Depression, Bipolar and EPDS of 23.  CSW met with MOB to offer support and complete assessment. When CSW entered the room, MOB was walking about the room, holding the  infant. CSW introduced self, CSW role and reason for visit. MOB was agreeable to visit. CSW inquired about how MOB was feeling, MOB reported she was feeling better since getting some rest. MOB explained her delivery went better than expected. CSW confirmed demographic information on file with MOB.   CSW inquired about MOB MH hx, MOB reported she was diagnosed with Anxiety and Depression at age 13 and about a month ago her Bipolar diagnosis was changed to Borderline Personality Disorder and PTSD. MOB reported she currently takes Seroquel and Zoloft. MOB reported she recently started the Zoloft and can already tell a difference. CSW inquired about therapy services, MOB reported she begins therapy on 10/29 through [facility] [place]. CSW inquired about MOB's mood over the past 7 days, MOB reported she always feels anxious, panicky, sad, and overwhelmed and that's the reason she is beginning therapy. CSW assessed for safety, MOB denied ay SI, HI or DV. MOB named FOB as her support. CSW provided education regarding the baby blues period vs. perinatal mood disorders, discussed treatment and gave resources for mental  health follow up if concerns arise.  CSW recommends self-evaluation during the postpartum time period using the New Mom Checklist from Postpartum Progress and encouraged MOB to contact a medical professional if symptoms are noted at any time.    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 MOB reported when she found out she has not had substances since. 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 CSW inquired about CPS involvement, MOB reported there was CPS involvement in the past but none currently.   CSW provided review of Sudden Infant Death Syndrome (SIDS) precautions. MOB reported they have all necessary items for the infant including a Pack n  Play with a bassinet attachment for the infant to sleep. CSW identifies no further need for intervention and no barriers to discharge at this time.   CSW Plan/Description:  No Further Intervention Required/No Barriers to Discharge, Sudden Infant Death Syndrome (SIDS) Education, Perinatal Mood and Anxiety Disorder (PMADs) Education, Other Patient/Family Education    Boris Sharper, LCSW 05/11/2022, 10:52 AM

## 2022-05-11 NOTE — Lactation Note (Signed)
This note was copied from a baby's chart. Lactation Consultation Note  Patient Name: [name] [name] ESLPN'P Date: 05/11/2022 Reason for consult: Follow-up assessment;Mother's request;Early term 37-38.6wks;Infant weight loss;Breastfeeding assistance (3.17% WL) Age:34 hours  LC entered the room and the infant was in the bassinet.  Per the birth parent, she has been trying to pump after each feeding.  She stated that last night, she was tired and was not able to pump as much.  LC encouraged the birth parent to stick to her pumping schedule as much as possible.  The birth parent stated that she was having some nipple tenderness.  LC gave the birth parent coconut oil and spoke with her about putting some in the flanges when pumping.  LC also encouraged the birth parent to use the coconut oil between pumping sessions.  The birth parent said that she has been taught about milk storage and had no further questions or concerns.  Morton left her name on the board.  The birth parent will call RN/LC for assistance with breastfeeding.   Feeding Mother's Current Feeding Choice: Breast Milk and Formula   Lactation Tools Discussed/Used Tools: Coconut oil  Interventions Interventions: Breast feeding basics reviewed;Education;Coconut oil  Consult Status Consult Status: Follow-up Date: 05/12/22 Follow-up type: In-patient    Lysbeth Penner 05/11/2022, 10:48 AM

## 2022-05-13 ENCOUNTER — Other Ambulatory Visit: Payer: Medicaid Other

## 2022-05-15 LAB — HSV DNA BY PCR (REFERENCE LAB)
HSV 1 DNA: NEGATIVE
HSV 2 DNA: NEGATIVE

## 2022-05-16 ENCOUNTER — Other Ambulatory Visit: Payer: Medicaid Other

## 2022-05-16 ENCOUNTER — Telehealth (INDEPENDENT_AMBULATORY_CARE_PROVIDER_SITE_OTHER): Payer: Medicaid Other | Admitting: Psychiatry

## 2022-05-16 ENCOUNTER — Ambulatory Visit (INDEPENDENT_AMBULATORY_CARE_PROVIDER_SITE_OTHER): Payer: Medicaid Other | Admitting: *Deleted

## 2022-05-16 ENCOUNTER — Encounter: Payer: Medicaid Other | Admitting: Obstetrics & Gynecology

## 2022-05-16 ENCOUNTER — Other Ambulatory Visit: Payer: Self-pay | Admitting: Obstetrics & Gynecology

## 2022-05-16 DIAGNOSIS — F4310 Post-traumatic stress disorder, unspecified: Secondary | ICD-10-CM | POA: Diagnosis not present

## 2022-05-16 DIAGNOSIS — F332 Major depressive disorder, recurrent severe without psychotic features: Secondary | ICD-10-CM

## 2022-05-16 DIAGNOSIS — O133 Gestational [pregnancy-induced] hypertension without significant proteinuria, third trimester: Secondary | ICD-10-CM

## 2022-05-16 DIAGNOSIS — F5105 Insomnia due to other mental disorder: Secondary | ICD-10-CM

## 2022-05-16 DIAGNOSIS — Z0130 Encounter for examination of blood pressure without abnormal findings: Secondary | ICD-10-CM

## 2022-05-16 DIAGNOSIS — F411 Generalized anxiety disorder: Secondary | ICD-10-CM | POA: Diagnosis not present

## 2022-05-16 MED ORDER — NIFEDIPINE ER OSMOTIC RELEASE 90 MG PO TB24: 90.0000 mg | ORAL_TABLET | Freq: Every day | ORAL | 6 refills | Status: DC

## 2022-05-16 NOTE — Progress Notes (Addendum)
   NURSE VISIT- BLOOD PRESSURE CHECK  SUBJECTIVE:  [name] [name] is a 36 y.o. [phone] female here for BP check. She is postpartum, delivery date 05/09/22.     HYPERTENSION ROS:  Pregnant/postpartum:  Severe headaches that don't go away with tylenol/other medicines: Yes  Visual changes (seeing spots/double/blurred vision) No  Severe pain under right breast breast or in center of upper chest No  Severe nausea/vomiting No  Taking medicines as instructed yes   OBJECTIVE:  BP (!) 163/93 (BP Location: Right Arm, Patient Position: Sitting, Cuff Size: Normal)   Pulse 86   Appearance alert, well appearing, and in no distress.  ASSESSMENT: Postpartum  blood pressure check  PLAN: Discussed with Dr. Nelda Marseille   Initially the plan was to increase her medication; however, when she notes change in her vision and intractable headache- advised pt to go to [facility] for further evaluation and management.  Pt stated that she can't go to [place] today. Strongly encouraged her to go for evaluation. Pt again stated that she can't go today. I told her that if anything changed at all she needed to go be seen. Alternatively, new Rx of procardiaXL 90 mg sent in.  Pt to have follow up next week  Follow-up:  1 week    Janece Canterbury  05/16/2022 2:39 PM   Chart reviewed for nurse visit. Agree with plan of care.  Janyth Pupa, DO

## 2022-05-16 NOTE — Patient Instructions (Signed)
We didn't make any changes to your medications today. Dr. Nehemiah Settle will try to coordinate with your OB office to see if they can get an updated ecg for you.
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
## 2022-05-16 NOTE — Progress Notes (Unsigned)
.  proc

## 2022-05-20 ENCOUNTER — Telehealth: Payer: Self-pay | Admitting: *Deleted

## 2022-05-20 ENCOUNTER — Other Ambulatory Visit: Payer: Medicaid Other

## 2022-05-20 NOTE — Telephone Encounter (Signed)
Pt is requesting to return to work. 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 She had a vaginal delivery on 05/09/22. Advised I would send message to Dr. Elonda Husky to see if she can return to work now.

## 2022-05-21 ENCOUNTER — Ambulatory Visit (INDEPENDENT_AMBULATORY_CARE_PROVIDER_SITE_OTHER): Payer: Medicaid Other | Admitting: Clinical

## 2022-05-21 ENCOUNTER — Encounter (HOSPITAL_COMMUNITY): Payer: Self-pay

## 2022-05-21 DIAGNOSIS — F4310 Post-traumatic stress disorder, unspecified: Secondary | ICD-10-CM

## 2022-05-21 DIAGNOSIS — F332 Major depressive disorder, recurrent severe without psychotic features: Secondary | ICD-10-CM | POA: Diagnosis not present

## 2022-05-21 DIAGNOSIS — F411 Generalized anxiety disorder: Secondary | ICD-10-CM
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
## 2022-05-21 NOTE — Plan of Care (Signed)
Verbal Consent 

## 2022-05-22 ENCOUNTER — Encounter: Payer: Self-pay | Admitting: *Deleted

## 2022-05-23 ENCOUNTER — Other Ambulatory Visit: Payer: Medicaid Other

## 2022-05-23 ENCOUNTER — Ambulatory Visit: Payer: Medicaid Other

## 2022-05-23 ENCOUNTER — Encounter: Payer: Medicaid Other | Admitting: Obstetrics & Gynecology

## 2022-05-27 ENCOUNTER — Other Ambulatory Visit: Payer: Medicaid Other

## 2022-06-05 ENCOUNTER — Telehealth (HOSPITAL_COMMUNITY): Payer: Medicaid Other | Admitting: Psychiatry

## 2022-06-05 ENCOUNTER — Encounter (HOSPITAL_COMMUNITY): Payer: Self-pay

## 2022-06-06 ENCOUNTER — Ambulatory Visit (INDEPENDENT_AMBULATORY_CARE_PROVIDER_SITE_OTHER): Payer: Medicaid Other | Admitting: Obstetrics & Gynecology

## 2022-06-06 ENCOUNTER — Encounter: Payer: Self-pay | Admitting: Obstetrics & Gynecology

## 2022-06-06 NOTE — Progress Notes (Signed)
Subjective:     [name] [name] is a 36 y.o. female who presents for a postpartum visit. She is 6 weeks postpartum following a spontaneous vaginal delivery. I have fully reviewed the prenatal and intrapartum course. The delivery was at [redacted]w[redacted]d gestational weeks. Outcome: spontaneous vaginal delivery. Anesthesia: epidural. Postpartum course has been normal. Baby's course has been normal. Baby is feeding by both breast and bottle - Similac with Iron. Bleeding no bleeding. Bowel function is normal. Bladder function is normal. Patient is sexually active. Contraception method is oral progesterone-only contraceptive. Postpartum depression screening: negative.    Review of Systems Pertinent items are noted in HPI.   Objective:    BP 129/88 (BP Location: Left Arm, Patient Position: Sitting, Cuff Size: Normal)   Pulse 90   Ht 5\' 7"  (1.702 m)   Wt (!) 344 lb 3.2 oz (156.1 kg)   LMP 06/01/2022 (Approximate)   Breastfeeding Yes   BMI 53.91 kg/m   General:  alert, cooperative, and no distress   Breasts:    Lungs:   Heart:    Abdomen:    Vulva:  normal  Vagina: not evaluated  Cervix:  absent  Corpus: normal size, contour, position, consistency, mobility, non-tender  Adnexa:  normal adnexa  Rectal Exam:         Assessment:    Normal postpartum exam. Pap smear not done at today's visit.   Plan:    1. Contraception: oral progesterone-only contraceptive 2.  3. Follow up in:  prn   or as needed.

## 2022-06-12 ENCOUNTER — Encounter: Payer: Self-pay | Admitting: Family Medicine

## 2022-06-12 ENCOUNTER — Ambulatory Visit (INDEPENDENT_AMBULATORY_CARE_PROVIDER_SITE_OTHER): Payer: Medicaid Other | Admitting: Family Medicine

## 2022-06-12 VITALS — BP 118/78 | HR 87 | Ht 65.0 in | Wt 349.0 lb

## 2022-06-12 DIAGNOSIS — K219 Gastro-esophageal reflux disease without esophagitis: Secondary | ICD-10-CM | POA: Diagnosis not present

## 2022-06-12 MED ORDER — PANTOPRAZOLE SODIUM 20 MG PO TBEC: 20.0000 mg | DELAYED_RELEASE_TABLET | Freq: Every day | ORAL | 0 refills | Status: DC | PRN

## 2022-06-12 NOTE — Assessment & Plan Note (Signed)
We will start the patient on Protonix 20 mg daily as needed today Encouraged to avoid GERD producing foods such as: Avoid certain foods and drinks, such as coffee, chocolate, onions, peppermint, spicy foods, carbonated beverages, citrus fruits, tomatoes, onions, Garlic, alcohol, Fatty foods (bacon, burgers, sausages, steak, fried foods, dairy food)    Recommended: High fiber foods, whole grain cereal, oatmeal, brown rice, root vegetables, non- citrus fruits, High protein foods, Health fats (avocados, olive oil, nuts and seeds)

## 2022-06-12 NOTE — Progress Notes (Deleted)
Established Patient Office Visit  Subjective:  Patient ID: [name] [name], female    DOB: 12-05-1985  Age: 36 y.o. MRN: [id number]  CC:  Chief Complaint  Patient presents with   Obesity    Would like to discuss weight loss her baby is 70 month old, pt reports breastfeeding but not producing enough milk.    Medication Refill    Pt needs refill on allergy medication and would like to have something for heart burn.     HPI [name] [name] is a 36 y.o. female with past medical history of *** presents for f/u of *** chronic medical conditions.    Past Medical History:  Diagnosis Date   Abnormal Pap smear    Anxiety    Anxiety and depression 02/01/2022   Asthma    Bipolar 1 disorder ([place])    Borderline personality disorder ([place])    Depression    GERD (gastroesophageal reflux disease)    IUD (intrauterine device) in place 04/12/2013   Had IUD inserted 03/29/13 could not feel strings went to ER 9/18 and KUB saw IUD still could not see string, can't see now but seen in US   Kidney infection    Morbid obesity ([place])    Pregnancy    Pregnancy induced hypertension    PTSD (post-traumatic stress disorder)    UTI (urinary tract infection)     Past Surgical History:  Procedure Laterality Date   CESAREAN SECTION     HYSTEROSCOPY N/A 01/09/2021   Procedure: HYSTEROSCOPY;  Surgeon: Janyth Pupa, DO;  Location: [facility];  Service: Gynecology;  Laterality: N/A;   IUD REMOVAL N/A 01/09/2021   Procedure: INTRAUTERINE DEVICE (IUD) REMOVAL;  Surgeon: Janyth Pupa, DO;  Location: [facility];  Service: Gynecology;  Laterality: N/A;   TOOTH EXTRACTION      Family History  Problem Relation Age of Onset   Diabetes Paternal Grandfather    Hypertension Paternal Grandmother    Diabetes Paternal Grandmother    Hypertension Father    Diabetes Father    Congestive Heart Failure Father    ADD / ADHD Son    Diabetes Paternal Aunt    Diabetes Paternal Uncle     Social History   Socioeconomic  History   Marital status: Significant Other    Spouse name: Not on file   Number of children: 2   Years of education: Not on file   Highest education level: Not on file  Occupational History   Not on file  Tobacco Use   Smoking status: Former    Types: Cigarettes    Quit date: 05/21/2007    Years since quitting: 15.0   Smokeless tobacco: Never  Vaping Use   Vaping Use: Never used  Substance and Sexual Activity   Alcohol use: Not Currently    Comment: one during pregnancy. Occasionally with drink whole bottle of liquor. Socially will have 2-3 drinks.   Drug use: Not Currently    Types: Marijuana, Cocaine    Comment: Prior to pregnancy ate edibles and 4-5 blunts per day but potentially more if drinking/playing video games.   Sexual activity: Yes    Partners: Male    Birth control/protection: Pill  Other Topics Concern   Not on file  Social History Narrative   Not on file   Social Determinants of Health   Financial Resource Strain: Low Risk  (02/28/2022)   Overall Financial Resource Strain (CARDIA)    Difficulty of Paying Living Expenses: Not hard at  all  Food Insecurity: No Food Insecurity (02/28/2022)   Hunger Vital Sign    Worried About Running Out of Food in the Last Year: Never true    Ran Out of Food in the Last Year: Never true  Transportation Needs: No Transportation Needs (02/28/2022)   PRAPARE - Hydrologist (Medical): No    Lack of Transportation (Non-Medical): No  Physical Activity: Sufficiently Active (02/28/2022)   Exercise Vital Sign    Days of Exercise per Week: 3 days    Minutes of Exercise per Session: 60 min  Stress: Stress Concern Present (02/28/2022)   [place]    Feeling of Stress : Rather much  Social Connections: Socially Isolated (02/28/2022)   Social Connection and Isolation Panel [NHANES]    Frequency of Communication with Friends and Family: Twice a  week    Frequency of Social Gatherings with Friends and Family: Once a week    Attends Religious Services: Never    Marine scientist or Organizations: No    Attends Archivist Meetings: Never    Marital Status: Divorced  Human resources officer Violence: Not At Risk (02/28/2022)   Humiliation, Afraid, Rape, and Kick questionnaire    Fear of Current or Ex-Partner: No    Emotionally Abused: No    Physically Abused: No    Sexually Abused: No    Outpatient Medications Prior to Visit  Medication Sig Dispense Refill   acetaminophen (TYLENOL) 500 MG tablet Take 1,000 mg by mouth daily.     albuterol (VENTOLIN HFA) 108 (90 Base) MCG/ACT inhaler Inhale 2 puffs into the lungs every 6 (six) hours as needed for wheezing or shortness of breath. 8 g 0   Blood Pressure Monitor MISC For regular home bp monitoring during pregnancy Needs large cuff 1 each 0   cetirizine (ZYRTEC) 10 MG tablet Take 1 tablet (10 mg total) by mouth daily. 90 tablet 0   fluticasone (FLONASE) 50 MCG/ACT nasal spray Place 1 spray into both nostrils daily. 16 g 0   ibuprofen (ADVIL) 600 MG tablet Take 1 tablet (600 mg total) by mouth every 6 (six) hours. 30 tablet 0   MELATONIN PO Take 24 mg by mouth daily at 6 (six) AM.     montelukast (SINGULAIR) 10 MG tablet TAKE 1 TABLET BY MOUTH AT BEDTIME 30 tablet 0   NIFEdipine (PROCARDIA XL) 90 MG 24 hr tablet Take 1 tablet (90 mg total) by mouth daily. 30 tablet 6   Prenatal Vit-Fe Fumarate-FA (PRENATAL VITAMIN PO) Take by mouth.     QUEtiapine (SEROQUEL) 50 MG tablet Take half a tablet for one week at night. Then increase to 1 full tablet. 60 tablet 2   sertraline (ZOLOFT) 25 MG tablet Take 1 tablet (25 mg total) by mouth at bedtime. 30 tablet 6   UNABLE TO FIND Med Name: maternal belly band/ support belt [redacted] weeks pregnant 1 each 0   FOLIC ACID PO Take by mouth. (Patient not taking: Reported on 06/12/2022)     furosemide (LASIX) 20 MG tablet Take 1 tablet (20 mg total) by  mouth daily. (Patient not taking: Reported on 06/12/2022) 5 tablet 0   norethindrone (ORTHO MICRONOR) 0.35 MG tablet Take 1 tablet (0.35 mg total) by mouth daily. (Patient not taking: Reported on 06/12/2022) 90 tablet 4   No facility-administered medications prior to visit.    Allergies  Allergen Reactions   Shellfish Allergy Anaphylaxis  Throat swelling    ROS Review of Systems    Objective:    Physical Exam  BP 118/78   Pulse 87   Ht _0  (1.651 m)   Wt (!) 349 lb 0.6 oz (158.3 kg)   LMP 06/01/2022 (Approximate)   SpO2 97%   Breastfeeding Yes   BMI 58.08 kg/m  Wt Readings from Last 3 Encounters:  06/12/22 (!) 349 lb 0.6 oz (158.3 kg)  06/06/22 (!) 344 lb 3.2 oz (156.1 kg)  05/06/22 (!) 360 lb 12.8 oz (163.7 kg)    Lab Results  Component Value Date   TSH 1.425 05/12/2013   Lab Results  Component Value Date   WBC 12.5 (H) 05/09/2022   HGB 10.6 (L) 05/09/2022   HCT 33.4 (L) 05/09/2022   MCV 82.9 05/09/2022   PLT 210 05/09/2022   Lab Results  Component Value Date   NA 138 05/08/2022   K 4.1 05/08/2022   CO2 19 (L) 05/08/2022   GLUCOSE 71 05/08/2022   BUN 10 05/08/2022   CREATININE 0.72 05/08/2022   BILITOT 0.1 (L) 05/08/2022   ALKPHOS 125 05/08/2022   AST 24 05/08/2022   ALT 18 05/08/2022   PROT 6.3 (L) 05/08/2022   ALBUMIN 2.6 (L) 05/08/2022   CALCIUM 8.9 05/08/2022   ANIONGAP 12 05/08/2022   EGFR 121 04/04/2022   No results found for: "CHOL" No results found for: "HDL" No results found for: "LDLCALC" No results found for: "TRIG" No results found for: "CHOLHDL" Lab Results  Component Value Date   HGBA1C 5.4 11/14/2021      Assessment & Plan:  There are no diagnoses linked to this encounter.  Follow-up: No follow-ups on file.   Alvira Monday, FNP

## 2022-06-12 NOTE — Assessment & Plan Note (Signed)
Oral phentermine is contraindicated in breast-feeding Discussed Intermittent fasting Encourage adherence to a heart healthy diet and increase her physical activities Patient verbalized understanding Wt Readings from Last 3 Encounters:  06/12/22 (!) 349 lb 0.6 oz (158.3 kg)  06/06/22 (!) 344 lb 3.2 oz (156.1 kg)  05/06/22 (!) 360 lb 12.8 oz (163.7 kg)

## 2022-06-12 NOTE — Patient Instructions (Addendum)
I appreciate the opportunity to provide care to you today!    Follow up:  10/09/2022   Please pick up your medications at the pharmacy   I recommend:  Eat more home-cooked food. Eat less restaurant, buffet, and fast food. Limit or avoid alcohol. Limit foods that are high in starch and sugar. Avoid fried foods. Lose weight if you are overweight. Keep track of how much salt (sodium) you eat. This is important if you have high blood pressure. Ask your doctor to tell you more about this. Try to add vegetarian meals each week.   Please avoid these foods that lead to gastric reflux: Avoid certain foods and drinks, such as coffee, chocolate, onions, peppermint, spicy foods, carbonated beverages, citrus fruits, tomatoes, onions, Garlic, alcohol, Fatty foods (bacon, burgers, sausages, steak, fried foods, dairy food)   Please continue to a heart-healthy diet and increase your physical activities. Try to exercise for at least three times a week.      It was a pleasure to see you and I look forward to continuing to work together on your health and well-being. Please do not hesitate to call the office if you need care or have questions about your care.   Have a wonderful day and week. With Gratitude, Gilmore Laroche MSN, FNP-BC

## 2022-06-12 NOTE — Progress Notes (Signed)
Established Patient Office Visit  Subjective:  Patient ID: [name] [name], female    DOB: [birth year]  Age: 36 y.o. MRN: [id number]  CC:  Chief Complaint  Patient presents with   Obesity    Would like to discuss weight loss her baby is 64 month old, pt reports breastfeeding but not producing enough milk.    Medication Refill    Pt needs refill on allergy medication and would like to have something for heart burn.     HPI [name] [name] is a 36 y.o. female with past medical history of gestational hypertension, asthma, generalized anxiety disorder presents for f/u of  chronic medical conditions.  GERD: She complains of gastric reflux, dyspepsia, and heartburn, noting that she takes about 10 tums daily.  She would like to be treated for GERD symptoms today.  Obesity: She had a baby boy a month ago and was noted to be breastfeeding with decreased milk supply.  She reports minimal physical activities and would like to discuss weight loss options today.  She reports that she is not interested in gastric bypass surgery, as suggested by her previous provider.  Past Medical History:  Diagnosis Date   Abnormal Pap smear    Anxiety    Anxiety and depression 02/01/2022   Asthma    Bipolar 1 disorder ([place])    Borderline personality disorder ([place])    Depression    GERD (gastroesophageal reflux disease)    IUD (intrauterine device) in place 04/12/2013   Had IUD inserted 03/29/13 could not feel strings went to ER 9/18 and KUB saw IUD still could not see string, can't see now but seen in US   Kidney infection    Morbid obesity ([place])    Pregnancy    Pregnancy induced hypertension    PTSD (post-traumatic stress disorder)    UTI (urinary tract infection)     Past Surgical History:  Procedure Laterality Date   CESAREAN SECTION     HYSTEROSCOPY N/A 01/09/2021   Procedure: HYSTEROSCOPY;  Surgeon: Janyth Pupa, DO;  Location: [facility];  Service: Gynecology;  Laterality: N/A;   IUD REMOVAL N/A  01/09/2021   Procedure: INTRAUTERINE DEVICE (IUD) REMOVAL;  Surgeon: Janyth Pupa, DO;  Location: [facility];  Service: Gynecology;  Laterality: N/A;   TOOTH EXTRACTION      Family History  Problem Relation Age of Onset   Diabetes Paternal Grandfather    Hypertension Paternal Grandmother    Diabetes Paternal Grandmother    Hypertension Father    Diabetes Father    Congestive Heart Failure Father    ADD / ADHD Son    Diabetes Paternal Aunt    Diabetes Paternal Uncle     Social History   Socioeconomic History   Marital status: Significant Other    Spouse name: Not on file   Number of children: 2   Years of education: Not on file   Highest education level: Not on file  Occupational History   Not on file  Tobacco Use   Smoking status: Former    Types: Cigarettes    Quit date: 05/21/2007    Years since quitting: 15.0   Smokeless tobacco: Never  Vaping Use   Vaping Use: Never used  Substance and Sexual Activity   Alcohol use: Not Currently    Comment: one during pregnancy. Occasionally with drink whole bottle of liquor. Socially will have 2-3 drinks.   Drug use: Not Currently    Types: Marijuana, Cocaine  Comment: Prior to pregnancy ate edibles and 4-5 blunts per day but potentially more if drinking/playing video games.   Sexual activity: Yes    Partners: Male    Birth control/protection: Pill  Other Topics Concern   Not on file  Social History Narrative   Not on file   Social Determinants of Health   Financial Resource Strain: Low Risk  (02/28/2022)   Overall Financial Resource Strain (CARDIA)    Difficulty of Paying Living Expenses: Not hard at all  Food Insecurity: No Food Insecurity (02/28/2022)   Hunger Vital Sign    Worried About Running Out of Food in the Last Year: Never true    Ran Out of Food in the Last Year: Never true  Transportation Needs: No Transportation Needs (02/28/2022)   PRAPARE - Transportation    Lack of Transportation (Medical): No    Lack  of Transportation (Non-Medical): No  Physical Activity: Sufficiently Active (02/28/2022)   Exercise Vital Sign    Days of Exercise per Week: 3 days    Minutes of Exercise per Session: 60 min  Stress: Stress Concern Present (02/28/2022)   [employer] - Occupational Stress Questionnaire    Feeling of Stress : Rather much  Social Connections: Socially Isolated (02/28/2022)   Social Connection and Isolation Panel [NHANES]    Frequency of Communication with Friends and Family: Twice a week    Frequency of Social Gatherings with Friends and Family: Once a week    Attends Religious Services: Never    Active Member of Clubs or Organizations: No    Attends Club or Organization Meetings: Never    Marital Status: Divorced  Intimate Partner Violence: Not At Risk (02/28/2022)   Humiliation, Afraid, Rape, and Kick questionnaire    Fear of Current or Ex-Partner: No    Emotionally Abused: No    Physically Abused: No    Sexually Abused: No    Outpatient Medications Prior to Visit  Medication Sig Dispense Refill   acetaminophen (TYLENOL) 500 MG tablet Take 1,000 mg by mouth daily.     albuterol (VENTOLIN HFA) 108 (90 Base) MCG/ACT inhaler Inhale 2 puffs into the lungs every 6 (six) hours as needed for wheezing or shortness of breath. 8 g 0   Blood Pressure Monitor MISC For regular home bp monitoring during pregnancy Needs large cuff 1 each 0   cetirizine (ZYRTEC) 10 MG tablet Take 1 tablet (10 mg total) by mouth daily. 90 tablet 0   fluticasone (FLONASE) 50 MCG/ACT nasal spray Place 1 spray into both nostrils daily. 16 g 0   ibuprofen (ADVIL) 600 MG tablet Take 1 tablet (600 mg total) by mouth every 6 (six) hours. 30 tablet 0   MELATONIN PO Take 24 mg by mouth daily at 6 (six) AM.     montelukast (SINGULAIR) 10 MG tablet TAKE 1 TABLET BY MOUTH AT BEDTIME 30 tablet 0   NIFEdipine (PROCARDIA XL) 90 MG 24 hr tablet Take 1 tablet (90 mg total) by mouth daily. 30 tablet 6    Prenatal Vit-Fe Fumarate-FA (PRENATAL VITAMIN PO) Take by mouth.     QUEtiapine (SEROQUEL) 50 MG tablet Take half a tablet for one week at night. Then increase to 1 full tablet. 60 tablet 2   sertraline (ZOLOFT) 25 MG tablet Take 1 tablet (25 mg total) by mouth at bedtime. 30 tablet 6   UNABLE TO FIND Med Name: maternal belly band/ support belt [redacted] weeks pregnant 1 each 0   FOLIC   ACID PO Take by mouth. (Patient not taking: Reported on 06/12/2022)     furosemide (LASIX) 20 MG tablet Take 1 tablet (20 mg total) by mouth daily. (Patient not taking: Reported on 06/12/2022) 5 tablet 0   norethindrone (ORTHO MICRONOR) 0.35 MG tablet Take 1 tablet (0.35 mg total) by mouth daily. (Patient not taking: Reported on 06/12/2022) 90 tablet 4   No facility-administered medications prior to visit.    Allergies  Allergen Reactions   Shellfish Allergy Anaphylaxis    Throat swelling    ROS Review of Systems  Constitutional:  Negative for fatigue and fever.  Eyes:  Negative for visual disturbance.  Respiratory:  Negative for chest tightness and shortness of breath.   Cardiovascular:  Negative for chest pain and palpitations.  Gastrointestinal:  Negative for nausea and vomiting.  Neurological:  Negative for dizziness and headaches.      Objective:    Physical Exam Constitutional:      Appearance: She is obese.  HENT:     Head: Normocephalic.     Right Ear: External ear normal.     Left Ear: External ear normal.  Cardiovascular:     Rate and Rhythm: Normal rate and regular rhythm.     Pulses: Normal pulses.     Heart sounds: Normal heart sounds.  Pulmonary:     Effort: Pulmonary effort is normal.     Breath sounds: Normal breath sounds.  Neurological:     Mental Status: She is alert.     BP 118/78   Pulse 87   Ht 5' 5" (1.651 m)   Wt (!) 349 lb 0.6 oz (158.3 kg)   LMP 06/01/2022 (Approximate)   SpO2 97%   Breastfeeding Yes   BMI 58.08 kg/m  Wt Readings from Last 3 Encounters:   06/12/22 (!) 349 lb 0.6 oz (158.3 kg)  06/06/22 (!) 344 lb 3.2 oz (156.1 kg)  05/06/22 (!) 360 lb 12.8 oz (163.7 kg)    Lab Results  Component Value Date   TSH 1.425 05/12/2013   Lab Results  Component Value Date   WBC 12.5 (H) 05/09/2022   HGB 10.6 (L) 05/09/2022   HCT 33.4 (L) 05/09/2022   MCV 82.9 05/09/2022   PLT 210 05/09/2022   Lab Results  Component Value Date   NA 138 05/08/2022   K 4.1 05/08/2022   CO2 19 (L) 05/08/2022   GLUCOSE 71 05/08/2022   BUN 10 05/08/2022   CREATININE 0.72 05/08/2022   BILITOT 0.1 (L) 05/08/2022   ALKPHOS 125 05/08/2022   AST 24 05/08/2022   ALT 18 05/08/2022   PROT 6.3 (L) 05/08/2022   ALBUMIN 2.6 (L) 05/08/2022   CALCIUM 8.9 05/08/2022   ANIONGAP 12 05/08/2022   EGFR 121 04/04/2022   No results found for: "CHOL" No results found for: "HDL" No results found for: "LDLCALC" No results found for: "TRIG" No results found for: "CHOLHDL" Lab Results  Component Value Date   HGBA1C 5.4 11/14/2021      Assessment & Plan:  Gastroesophageal reflux disease without esophagitis Assessment & Plan: We will start the patient on Protonix 20 mg daily as needed today Encouraged to avoid GERD producing foods such as: Avoid certain foods and drinks, such as coffee, chocolate, onions, peppermint, spicy foods, carbonated beverages, citrus fruits, tomatoes, onions, Garlic, alcohol, Fatty foods (bacon, burgers, sausages, steak, fried foods, dairy food)    Recommended: High fiber foods, whole grain cereal, oatmeal, brown rice, root vegetables, non- citrus fruits, High protein   foods, Health fats (avocados, olive oil, nuts and seeds)   Orders: -     Pantoprazole Sodium; Take 1 tablet (20 mg total) by mouth daily as needed.  Dispense: 30 tablet; Refill: 0  Morbid obesity ([place]) Assessment & Plan: Oral phentermine is contraindicated in breast-feeding Discussed Intermittent fasting Encourage adherence to a heart healthy diet and increase her  physical activities Patient verbalized understanding Wt Readings from Last 3 Encounters:  06/12/22 (!) 349 lb 0.6 oz (158.3 kg)  06/06/22 (!) 344 lb 3.2 oz (156.1 kg)  05/06/22 (!) 360 lb 12.8 oz (163.7 kg)        Follow-up: Return 10/09/2022.   Alvira Monday, FNP

## 2022-06-17 ENCOUNTER — Telehealth (HOSPITAL_COMMUNITY): Payer: Self-pay

## 2022-06-17 NOTE — Telephone Encounter (Signed)
Dr. Adonis Huguenin pt, please ask him tomorrow

## 2022-06-17 NOTE — Telephone Encounter (Signed)
Message rerouted to Dr. Adrian Blackwater as his assigned patient.

## 2022-06-17 NOTE — Telephone Encounter (Signed)
Medication management - Telephone call with patient to inform Dr. Adrian Blackwater would like her to have a new ECG completed since she started quetiapine medication. Patient requested we send this request to her PCP, Dr. Gilmore Laroche and message sent to provider with request for continuity of care.

## 2022-06-17 NOTE — Telephone Encounter (Signed)
Medication management - Patient left a message that she discussed the need for a ECG from her primary care provider, but they questioned if this was still needed since patient had one 03/21/22 in the ED? Patient questions if Dr. Tenny Craw wants them to do another one and any other labs or procedures so she can instruct her PCP.  Patient's ECG from 03/21/22 showed tachycardia.  Please review to let patient know what you would like them to order?

## 2022-06-18 ENCOUNTER — Ambulatory Visit (INDEPENDENT_AMBULATORY_CARE_PROVIDER_SITE_OTHER): Payer: Medicaid Other | Admitting: Clinical

## 2022-06-18 DIAGNOSIS — F332 Major depressive disorder, recurrent severe without psychotic features: Secondary | ICD-10-CM | POA: Diagnosis not present

## 2022-06-18 DIAGNOSIS — F4310 Post-traumatic stress disorder, unspecified: Secondary | ICD-10-CM | POA: Diagnosis not present

## 2022-06-18 DIAGNOSIS — F411 Generalized anxiety disorder: Secondary | ICD-10-CM

## 2022-06-18 NOTE — Progress Notes (Signed)
Virtual Visit via Telephone Note  I connected with [name] [name] on 06/18/22 at  8:00 AM EST by telephone and verified that I am speaking with the correct person using two identifiers.  Location: Patient: Home Provider: Office   I discussed the limitations, risks, security and privacy concerns of performing an evaluation and management service by telephone and the availability of in person appointments. I also discussed with the patient that there may be a patient responsible charge related to this service. The patient expressed understanding and agreed to proceed.  THERAPIST PROGRESS NOTE   Session Time: 8:00 AM-8:30 AM   Participation Level: Active   Behavioral Response: CasualAlertDepressed   Type of Therapy: Individual Therapy   Treatment Goals addressed: Anger and Coping   Interventions: CBT, Motivational Interviewing, Solution Focused and Strength-based   Summary: [name] Skelton. [name] is a 36 y.o. female who presents with Depression/ Anxiety/ PTSD. The OPT therapist worked with the patient for her ongoing OPT treatment. The OPT therapist utilized Motivational Interviewing to assist in creating therapeutic repore. The patient in the session was engaged and work in collaboration giving feedback about her triggers and symptoms over the past few weeks. The patient spoke about her recent Thanksgiving holiday and interactions with family. The patient spoke about upcoming move/transition to [place], [place] happening between now and the end of the year. The patient spoke about transfer with her work to the [place] location.The patient spoke about plans for the upcoming Christmas and New Years holidays. The OPT therapist utilized Cognitive Behavioral Therapy through cognitive restructuring as well as worked with the patient on coping strategies to assist in management of mood and being active. The OPT therapist promoted the patients work to challenge automatic negative thinking and promoted  positive mindset.The OPT therapist worked with the patient providing support and psycho-education. The patient spoke about her med therapy noting she feels it is currently helpful with no identified negative side effects. The patient overviewed her upcoming health appointments as listed in patients MyChart.   Suicidal/Homicidal: Nowithout intent/plan   Therapist Response: The OPT therapist worked with the patient for the patients scheduled session. The patient was engaged in her session and gave feedback in relation to triggers, symptoms, and behavior responses over the past few weeks. The patient shared progression in management of physical and mental health and reviewed upcoming large changes including moving, transitioning her job, and resettling in [place], [place]. The OPT therapist worked with the patient utilizing an in session Cognitive Behavioral Therapy exercise. The patient was responsive in the session and verbalized, " I am hoping to be moved in completely by the beginning of the New Year ". The OPT gave support and encouragement with the patient who continues to work on using coping strategies with consistency. The patient worked with the patient promoting on going self check-ins throughout her week.  The patient spoke about feeling more about to concentrate and focus with consistency with her med therapy.The patient spoke about looking forward to upcoming  Christmas holiday but being anxious about being around family.The patient will continue to work on her health and attend all scheduled health appointments .The OPT therapist will continue treatment work with the patient in her next scheduled session.   Plan: Return again in 2/3 weeks.   Diagnosis:      Axis I: PTSD/ Depression/ Anxiety  Axis II: No diagnosis   Collaboration of Care: collaboration of care for this session review of med therapy with Dr. Tenny Craw.   Patient/Guardian was advised Release of Information  must be obtained prior to any record release in order to collaborate their care with an outside provider. Patient/Guardian was advised if they have not already done so to contact the registration department to sign all necessary forms in order for [place] to release information regarding their care.    Consent: Patient/Guardian gives verbal consent for treatment and assignment of benefits for services provided during this visit. Patient/Guardian expressed understanding and agreed to proceed.        I discussed the assessment and treatment plan with the patient. The patient was provided an opportunity to ask questions and all were answered. The patient agreed with the plan and demonstrated an understanding of the instructions.   The patient was advised to call back or seek an in-person evaluation if the symptoms worsen or if the condition fails to improve as anticipated.   I provided 30 minutes of non-face-to-face time during this encounter.   Suzan Garibaldi, LCSW   06/18/2022

## 2022-06-18 NOTE — Telephone Encounter (Signed)
I would love to assist; please encourage the patient to schedule an appointment in the clinic for ECG

## 2022-06-20 NOTE — Telephone Encounter (Signed)
Medication management - Telephone cal with patient who agreed to call Dr. Rosalia Hammers office to schedule the needed and requested ECG. Patient to let us know if any issues getting this set up.

## 2022-06-23 ENCOUNTER — Encounter: Payer: Self-pay | Admitting: Family Medicine

## 2022-06-24 ENCOUNTER — Encounter (HOSPITAL_COMMUNITY): Payer: Self-pay | Admitting: Psychiatry

## 2022-06-24 ENCOUNTER — Telehealth (INDEPENDENT_AMBULATORY_CARE_PROVIDER_SITE_OTHER): Payer: Medicaid Other | Admitting: Psychiatry

## 2022-06-24 DIAGNOSIS — F332 Major depressive disorder, recurrent severe without psychotic features: Secondary | ICD-10-CM

## 2022-06-24 DIAGNOSIS — F4310 Post-traumatic stress disorder, unspecified: Secondary | ICD-10-CM | POA: Diagnosis not present

## 2022-06-24 DIAGNOSIS — F1290 Cannabis use, unspecified, uncomplicated: Secondary | ICD-10-CM | POA: Insufficient documentation

## 2022-06-24 DIAGNOSIS — F411 Generalized anxiety disorder: Secondary | ICD-10-CM

## 2022-06-24 DIAGNOSIS — F5105 Insomnia due to other mental disorder: Secondary | ICD-10-CM | POA: Diagnosis not present

## 2022-06-24 DIAGNOSIS — F3161 Bipolar disorder, current episode mixed, mild: Secondary | ICD-10-CM

## 2022-06-24 DIAGNOSIS — Z79899 Other long term (current) drug therapy: Secondary | ICD-10-CM | POA: Insufficient documentation

## 2022-06-24 DIAGNOSIS — F99 Mental disorder, not otherwise specified: Secondary | ICD-10-CM

## 2022-06-24 MED ORDER — SERTRALINE HCL 50 MG PO TABS: 50.0000 mg | ORAL_TABLET | Freq: Every day | ORAL | 1 refills | Status: DC

## 2022-06-24 MED ORDER — QUETIAPINE FUMARATE 50 MG PO TABS: ORAL_TABLET | ORAL | 2 refills | Status: DC
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
## 2022-06-24 NOTE — Patient Instructions (Signed)
Do try and get your ecg done this week. We increased your zoloft to 50mg  once daily today, this should help with the depression a bit more. If your suicidal ideation worsens, please let someone know and you can call [phone] or go to the emergency department.

## 2022-06-27 ENCOUNTER — Ambulatory Visit: Payer: Medicaid Other | Admitting: Family Medicine

## 2022-07-01 ENCOUNTER — Telehealth (HOSPITAL_COMMUNITY): Payer: Self-pay

## 2022-07-01 ENCOUNTER — Ambulatory Visit (INDEPENDENT_AMBULATORY_CARE_PROVIDER_SITE_OTHER): Payer: Medicaid Other | Admitting: Family Medicine

## 2022-07-01 ENCOUNTER — Encounter: Payer: Self-pay | Admitting: Obstetrics & Gynecology

## 2022-07-01 ENCOUNTER — Encounter: Payer: Self-pay | Admitting: Family Medicine

## 2022-07-01 DIAGNOSIS — J302 Other seasonal allergic rhinitis: Secondary | ICD-10-CM | POA: Diagnosis not present

## 2022-07-01 DIAGNOSIS — Z79899 Other long term (current) drug therapy: Secondary | ICD-10-CM | POA: Diagnosis not present

## 2022-07-01 MED ORDER — MONTELUKAST SODIUM 10 MG PO TABS: 10.0000 mg | ORAL_TABLET | Freq: Every day | ORAL | 2 refills | Status: DC

## 2022-07-01 NOTE — Patient Instructions (Signed)
I appreciate the opportunity to provide care to you today!    Follow up:  1 months  I recommend lifestyle modification, increasing your intake of healthy foods, vegetables, and fresh produce. I recommend avoiding fatty fried foods with increased physical activities We will reconvene in a month concerning your weight.   Please continue to a heart-healthy diet and increase your physical activities. Try to exercise for at least three times a week.      It was a pleasure to see you and I look forward to continuing to work together on your health and well-being. Please do not hesitate to call the office if you need care or have questions about your care.   Have a wonderful day and week. With Gratitude, Gilmore Laroche MSN, FNP-BC

## 2022-07-01 NOTE — Telephone Encounter (Signed)
Pt called in to let Dr Adrian Blackwater know that she had an EKG done by her pcp office today in chart

## 2022-07-01 NOTE — Progress Notes (Signed)
Established Patient Office Visit  Subjective:  Patient ID: [name] [name], female    DOB: 1985/11/27  Age: 36 y.o. MRN: [id number]  CC:  Chief Complaint  Patient presents with   Obesity    Pt would like to discuss weight loss options.    Follow-up    Pt reports needing an updated EKG, psych is needing this updated to get her started on medications.     HPI [name] [name] is a 36 y.o. female with past medical history of gestational hypertension, asthma, generalized anxiety disorder and PTSD presents for f/u of  chronic medical conditions.  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  She reports walking to work daily for 40 minutes, 20 minutes to work and  20 minutes walking home.  She would like to discuss weight loss options today.  Past Medical History:  Diagnosis Date   Abnormal Pap smear    Anxiety    Anxiety and depression 02/01/2022   Asthma    Bipolar 1 disorder ([place])    Borderline personality disorder ([place])    Depression    GERD (gastroesophageal reflux disease)    IUD (intrauterine device) in place 04/12/2013   Had IUD inserted 03/29/13 could not feel strings went to ER 9/18 and KUB saw IUD still could not see string, can't see now but seen in US   Kidney infection    Morbid obesity ([place])    Pregnancy    Pregnancy induced hypertension    PTSD (post-traumatic stress disorder)    UTI (urinary tract infection)     Past Surgical History:  Procedure Laterality Date   CESAREAN SECTION     HYSTEROSCOPY N/A 01/09/2021   Procedure: HYSTEROSCOPY;  Surgeon: Janyth Pupa, DO;  Location: [facility];  Service: Gynecology;  Laterality: N/A;   IUD REMOVAL N/A 01/09/2021   Procedure: INTRAUTERINE DEVICE (IUD) REMOVAL;  Surgeon: Janyth Pupa, DO;  Location: [facility];  Service: Gynecology;  Laterality: N/A;   TOOTH EXTRACTION      Family History  Problem Relation Age of Onset   Diabetes Paternal Grandfather    Hypertension Paternal  Grandmother    Diabetes Paternal Grandmother    Hypertension Father    Diabetes Father    Congestive Heart Failure Father    ADD / ADHD Son    Diabetes Paternal Aunt    Diabetes Paternal Uncle     Social History   Socioeconomic History   Marital status: Significant Other    Spouse name: Not on file   Number of children: 2   Years of education: Not on file   Highest education level: Not on file  Occupational History   Not on file  Tobacco Use   Smoking status: Former    Types: Cigarettes    Quit date: 05/21/2007    Years since quitting: 15.1   Smokeless tobacco: Never  Vaping Use   Vaping Use: Never used  Substance and Sexual Activity   Alcohol use: Not Currently    Comment: one during pregnancy. Occasionally with drink whole bottle of liquor. Socially will have 2-3 drinks.   Drug use: Yes    Types: Marijuana, Cocaine    Comment: 06/24/22 infrequent blunts. Prior to pregnancy ate edibles and 4-5 blunts per day but potentially more if drinking/playing video games.   Sexual activity: Yes    Partners: Male    Birth control/protection: Pill  Other Topics Concern   Not on file  Social History Narrative   Not on file   Social Determinants of Health   Financial Resource Strain: Low Risk  (02/28/2022)   Overall Financial Resource Strain (CARDIA)    Difficulty of Paying Living Expenses: Not hard at all  Food Insecurity: No Food Insecurity (02/28/2022)   Hunger Vital Sign    Worried About Running Out of Food in the Last Year: Never true    Ran Out of Food in the Last Year: Never true  Transportation Needs: No Transportation Needs (02/28/2022)   PRAPARE - Hydrologist (Medical): No    Lack of Transportation (Non-Medical): No  Physical Activity: Sufficiently Active (02/28/2022)   Exercise Vital Sign    Days of Exercise per Week: 3 days    Minutes of Exercise per Session: 60 min  Stress: Stress Concern Present (02/28/2022)   [place]    Feeling of Stress : Rather much  Social Connections: Socially Isolated (02/28/2022)   Social Connection and Isolation Panel [NHANES]    Frequency of Communication with Friends and Family: Twice a week    Frequency of Social Gatherings with Friends and Family: Once a week    Attends Religious Services: Never    Marine scientist or Organizations: No    Attends Archivist Meetings: Never    Marital Status: Divorced  Human resources officer Violence: Not At Risk (02/28/2022)   Humiliation, Afraid, Rape, and Kick questionnaire    Fear of Current or Ex-Partner: No    Emotionally Abused: No    Physically Abused: No    Sexually Abused: No    Outpatient Medications Prior to Visit  Medication Sig Dispense Refill   acetaminophen (TYLENOL) 500 MG tablet Take 1,000 mg by mouth daily.     albuterol (VENTOLIN HFA) 108 (90 Base) MCG/ACT inhaler Inhale 2 puffs into the lungs every 6 (six) hours as needed for wheezing or shortness of breath. 8 g 0   Blood Pressure Monitor MISC For regular home bp monitoring during pregnancy Needs large cuff 1 each 0   cetirizine (ZYRTEC) 10 MG tablet Take 1 tablet (10 mg total) by mouth daily. 90 tablet 0   norethindrone (ORTHO MICRONOR) 0.35 MG tablet Take 1 tablet (0.35 mg total) by mouth daily. 90 tablet 4   pantoprazole (PROTONIX) 20 MG tablet Take 1 tablet (20 mg total) by mouth daily as needed. 30 tablet 0   QUEtiapine (SEROQUEL) 50 MG tablet Take half a tablet for one week at night. Then increase to 1 full tablet. 60 tablet 2   sertraline (ZOLOFT) 50 MG tablet Take 1 tablet (50 mg total) by mouth at bedtime. 30 tablet 1   UNABLE TO FIND Med Name: maternal belly band/ support belt [redacted] weeks pregnant 1 each 0   montelukast (SINGULAIR) 10 MG tablet TAKE 1 TABLET BY MOUTH AT BEDTIME 30 tablet 0   NIFEdipine (PROCARDIA XL) 90 MG 24 hr tablet Take 1 tablet (90 mg total) by mouth daily. 30 tablet 6   No  facility-administered medications prior to visit.    Allergies  Allergen Reactions   Shellfish Allergy Anaphylaxis    Throat swelling    ROS Review of Systems  Constitutional:  Negative for chills and fever.  Eyes:  Negative for visual disturbance.  Respiratory:  Negative for chest tightness and shortness of breath.   Neurological:  Negative for dizziness and headaches.      Objective:  Physical Exam Constitutional:      Appearance: She is obese.  HENT:     Head: Normocephalic.     Mouth/Throat:     Mouth: Mucous membranes are moist.  Cardiovascular:     Rate and Rhythm: Normal rate.     Heart sounds: Normal heart sounds.  Pulmonary:     Effort: Pulmonary effort is normal.     Breath sounds: Normal breath sounds.  Neurological:     Mental Status: She is alert.     BP 138/89   Pulse 88   Ht _0  (1.702 m)   Wt (!) 348 lb 1.9 oz (157.9 kg)   LMP 06/01/2022 (Approximate)   SpO2 93%   Breastfeeding No   BMI 54.52 kg/m  Wt Readings from Last 3 Encounters:  07/01/22 (!) 348 lb 1.9 oz (157.9 kg)  06/12/22 (!) 349 lb 0.6 oz (158.3 kg)  06/06/22 (!) 344 lb 3.2 oz (156.1 kg)    Lab Results  Component Value Date   TSH 1.425 05/12/2013   Lab Results  Component Value Date   WBC 12.5 (H) 05/09/2022   HGB 10.6 (L) 05/09/2022   HCT 33.4 (L) 05/09/2022   MCV 82.9 05/09/2022   PLT 210 05/09/2022   Lab Results  Component Value Date   NA 138 05/08/2022   K 4.1 05/08/2022   CO2 19 (L) 05/08/2022   GLUCOSE 71 05/08/2022   BUN 10 05/08/2022   CREATININE 0.72 05/08/2022   BILITOT 0.1 (L) 05/08/2022   ALKPHOS 125 05/08/2022   AST 24 05/08/2022   ALT 18 05/08/2022   PROT 6.3 (L) 05/08/2022   ALBUMIN 2.6 (L) 05/08/2022   CALCIUM 8.9 05/08/2022   ANIONGAP 12 05/08/2022   EGFR 121 04/04/2022   No results found for: "CHOL" No results found for: "HDL" No results found for: "LDLCALC" No results found for: "TRIG" No results found for: "CHOLHDL" Lab Results   Component Value Date   HGBA1C 5.4 11/14/2021      Assessment & Plan:  Morbid obesity ([place]) Assessment & Plan: Encouraged lifestyle modification with heart healthy diet and increase physical activities We will reconvene in a month after implementation of lifestyle modification Wt Readings from Last 3 Encounters:  07/01/22 (!) 348 lb 1.9 oz (157.9 kg)  06/12/22 (!) 349 lb 0.6 oz (158.3 kg)  06/06/22 (!) 344 lb 3.2 oz (156.1 kg)      Long term current use of antipsychotic medication Assessment & Plan: Dr. Nehemiah Settle requested a new EKG since starting patient on quetiapine EKG completed in the office today with normal sinus rhythm EKG is forwarded to Dr. Nehemiah Settle  Orders: -     EKG 12-Lead  Seasonal allergic rhinitis, unspecified trigger -     Montelukast Sodium; Take 1 tablet (10 mg total) by mouth at bedtime.  Dispense: 30 tablet; Refill: 2    Follow-up: Return in about 1 month (around 08/01/2022) for obesity.   Alvira Monday, FNP

## 2022-07-01 NOTE — Assessment & Plan Note (Signed)
Dr. Stinson requested a new EKG since starting patient on quetiapine EKG completed in the office today with normal sinus rhythm EKG is forwarded to Dr. Stinson 

## 2022-07-01 NOTE — Assessment & Plan Note (Signed)
Encouraged lifestyle modification with heart healthy diet and increase physical activities We will reconvene in a month after implementation of lifestyle modification Wt Readings from Last 3 Encounters:  07/01/22 (!) 348 lb 1.9 oz (157.9 kg)  06/12/22 (!) 349 lb 0.6 oz (158.3 kg)  06/06/22 (!) 344 lb 3.2 oz (156.1 kg)

## 2022-07-01 NOTE — Assessment & Plan Note (Deleted)
Dr. Adrian Blackwater requested a new EKG since starting patient on quetiapine EKG completed in the office today with normal sinus rhythm EKG is forwarded to Dr. Adrian Blackwater

## 2022-07-02 ENCOUNTER — Ambulatory Visit (INDEPENDENT_AMBULATORY_CARE_PROVIDER_SITE_OTHER): Payer: Medicaid Other | Admitting: Adult Health

## 2022-07-02 ENCOUNTER — Encounter: Payer: Self-pay | Admitting: Adult Health

## 2022-07-02 VITALS — BP 138/89

## 2022-07-02 DIAGNOSIS — R102 Pelvic and perineal pain: Secondary | ICD-10-CM

## 2022-07-02 DIAGNOSIS — Z3041 Encounter for surveillance of contraceptive pills: Secondary | ICD-10-CM | POA: Insufficient documentation

## 2022-07-02 DIAGNOSIS — N921 Excessive and frequent menstruation with irregular cycle: Secondary | ICD-10-CM | POA: Diagnosis not present

## 2022-07-02 MED ORDER — LO LOESTRIN FE 1 MG-10 MCG / 10 MCG PO TABS: 1.0000 | ORAL_TABLET | Freq: Every day | ORAL | 11 refills | Status: DC

## 2022-07-02 NOTE — Progress Notes (Signed)
Patient ID: [name] [name], female   DOB: May 20, 1986, 36 y.o.   MRN: [id number]   TELEHEALTH GYNECOLOGY VISIT ENCOUNTER NOTE  [facility] location[facility]   Patient location: Home  I connected with [name] [name] on 07/02/22 at 11:50 AM EST by telephone and verified that I am speaking with the correct person using two identifiers. Patient was unable to do MyChart audiovisual encounter due to technical difficulties, she tried several times.    I discussed the limitations, risks, security and privacy concerns of performing an evaluation and management service by telephone and the availability of in person appointments. I also discussed with the patient that there may be a patient responsible charge related to this service. The patient expressed understanding and agreed to proceed.   History:  [name] [name] is a 36 y.o. [phone] female being evaluated today for changing birth control pills, is on Micronor and having BTB and cramping.. She denies any abnormal vaginal discharge,  or other concerns.       Past Medical History:  Diagnosis Date   Abnormal Pap smear    Anxiety    Anxiety and depression 02/01/2022   Asthma    Bipolar 1 disorder ([facility])    Borderline personality disorder ([facility])    Depression    GERD (gastroesophageal reflux disease)    IUD (intrauterine device) in place 04/12/2013   Had IUD inserted 03/29/13 could not feel strings went to ER 9/18 and KUB saw IUD still could not see string, can't see now but seen in US   Kidney infection    Morbid obesity ([facility])    Pregnancy    Pregnancy induced hypertension    PTSD (post-traumatic stress disorder)    UTI (urinary tract infection)    Past Surgical History:  Procedure Laterality Date   CESAREAN SECTION     HYSTEROSCOPY N/A 01/09/2021   Procedure: HYSTEROSCOPY;  Surgeon: Myna Hidalgo, DO;  Location: [facility];  Service: Gynecology;  Laterality: N/A;   IUD REMOVAL N/A 01/09/2021   Procedure:  INTRAUTERINE DEVICE (IUD) REMOVAL;  Surgeon: Myna Hidalgo, DO;  Location: [facility];  Service: Gynecology;  Laterality: N/A;   TOOTH EXTRACTION     The following portions of the patient's history were reviewed and updated as appropriate: allergies, current medications, past family history, past medical history, past social history, past surgical history and problem list.   Health Maintenance:  Normal pap and negative HRHPV on 12/26/20  Review of Systems:  Pertinent items noted in HPI and remainder of comprehensive ROS otherwise negative.  Physical Exam:   General:  Alert, oriented and cooperative.   Mental Status: Normal mood and affect perceived. Normal judgment and thought content.  Physical exam deferred due to nature of the encounter BP 138/89 (BP Location: Left Arm, Patient Position: Sitting, Cuff Size: Normal)   LMP 06/01/2022 (Approximate)    Upstream - 07/02/22 1152       Pregnancy Intention Screening   Does the patient want to become pregnant in the next year? No    Does the patient's partner want to become pregnant in the next year? No    Would the patient like to discuss contraceptive options today? Yes      Contraception Wrap Up   Current Method Oral Contraceptive    End Method Oral Contraceptive    Contraception Counseling Provided Yes    How was the end contraceptive method provided? Prescription  Labs and Imaging No results found for this or any previous visit (from the past 336 hour(s)). No results found.    Assessment and Plan:     1. Encounter for surveillance of contraceptive pills Finish current pack of pills then start lo loestrin and use condoms for 1 pack  Meds ordered this encounter  Medications   Norethindrone-Ethinyl Estradiol-Fe Biphas (LO LOESTRIN FE) 1 MG-10 MCG / 10 MCG tablet    Sig: Take 1 tablet by mouth daily. Take 1 daily by mouth    Dispense:  28 tablet    Refill:  11    BIN [id number], PCN [id number], GRP [id number]     Order Specific Question:   Supervising Provider    Answer:   Duane Lope H [[id number]]     2. Irregular intermenstrual bleeding  3. Pelvic cramping   Follow up in 3 months for ROS      I discussed the assessment and treatment plan with the patient. The patient was provided an opportunity to ask questions and all were answered. The patient agreed with the plan and demonstrated an understanding of the instructions.   The patient was advised to call back or seek an in-person evaluation/go to the ED if the symptoms worsen or if the condition fails to improve as anticipated.  I provided 10 minutes of non-face-to-face time during this encounter. I was in my office at [facility] during this encounter.  Cyril Mourning, NP [facility] [employer], [facility]

## 2022-07-11 ENCOUNTER — Other Ambulatory Visit: Payer: Self-pay | Admitting: Family Medicine

## 2022-07-11 ENCOUNTER — Ambulatory Visit: Payer: Medicaid Other | Admitting: Family Medicine

## 2022-07-11 DIAGNOSIS — K219 Gastro-esophageal reflux disease without esophagitis: Secondary | ICD-10-CM

## 2022-07-11 NOTE — Telephone Encounter (Signed)
Pt call asking about refill on this medication & also needs refill on montelukast (SINGULAIR) 10 MG tablet . Can you please refill?    [facility]

## 2022-07-12 ENCOUNTER — Telehealth (HOSPITAL_COMMUNITY): Payer: Self-pay

## 2022-07-12 NOTE — Telephone Encounter (Signed)
Medication management - Message left for patient, after she left one that she was having some increased depression and thought she may need some medications adjusted or changed. Informed Dr. Adrian Blackwater was off this week and would return 07/16/22 but would send message to covering provider to see if any changes recommended.  Also, informed if in any crisis of the address to [facility] in [place] or to her local ED if needed.  Requested patient call our office back today before noon if she would like medication changes prior to when Dr. Adrian Blackwater returns. Patient did not state any suicidal or homicidal ideations, plan or intent to want to harm self at this time.

## 2022-07-12 NOTE — Telephone Encounter (Signed)
Spoke to patient.  She did not have active suicidal thoughts but she still feels anxious.  She reported medicine helping and she noticed improvement in her anxiety and depression but is still there are moments where she described intense anxiety.  So far she is tolerating Seroquel 50 mg and Zoloft 50 mg.  I recommend consider increasing Zoloft 75 mg which she agreed with the plan.  Patient has appointment coming up in few days per Dr. Adrian Blackwater.  I recommend to keep that appointment.  Patient has enough pill until her appointment.  I also recommended if she feels worsening of symptoms or having any active suicidal thoughts then call 911 or go to local emergency room.  I will forward my notes to Dr. Adrian Blackwater.

## 2022-07-16 ENCOUNTER — Encounter (HOSPITAL_COMMUNITY): Payer: Self-pay | Admitting: Psychiatry

## 2022-07-16 ENCOUNTER — Encounter (HOSPITAL_COMMUNITY): Payer: Self-pay

## 2022-07-16 ENCOUNTER — Telehealth (INDEPENDENT_AMBULATORY_CARE_PROVIDER_SITE_OTHER): Payer: Medicaid Other | Admitting: Psychiatry

## 2022-07-16 DIAGNOSIS — F5105 Insomnia due to other mental disorder: Secondary | ICD-10-CM

## 2022-07-16 DIAGNOSIS — F4310 Post-traumatic stress disorder, unspecified: Secondary | ICD-10-CM | POA: Diagnosis not present

## 2022-07-16 DIAGNOSIS — F332 Major depressive disorder, recurrent severe without psychotic features: Secondary | ICD-10-CM | POA: Diagnosis not present

## 2022-07-16 DIAGNOSIS — F99 Mental disorder, not otherwise specified: Secondary | ICD-10-CM

## 2022-07-16 DIAGNOSIS — F411 Generalized anxiety disorder: Secondary | ICD-10-CM

## 2022-07-16 DIAGNOSIS — Z79899 Other long term (current) drug therapy: Secondary | ICD-10-CM

## 2022-07-16 MED ORDER — SERTRALINE HCL 100 MG PO TABS: 100.0000 mg | ORAL_TABLET | Freq: Every day | ORAL | 1 refills | Status: DC

## 2022-07-16 NOTE — Patient Instructions (Signed)
We increased her Zoloft to 100 mg once daily, will come in a 100 mg tablet which will be easier to take.  I will also include a DBT PDF here for you to begin looking through which should make her psychotherapy more effective when it does take place.  You can access that link here: https://www.mosley.info/.pdf  Keep up the good work with staying off of marijuana and alcohol.  In the long run this will help I know in the short run it probably is making things a little bit harder but you should become less impulsive and less depressed in the future.
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
## 2022-07-18 ENCOUNTER — Telehealth (HOSPITAL_COMMUNITY): Payer: Medicaid Other | Admitting: Psychiatry

## 2022-07-19 ENCOUNTER — Ambulatory Visit (INDEPENDENT_AMBULATORY_CARE_PROVIDER_SITE_OTHER): Payer: Medicaid Other | Admitting: Clinical

## 2022-07-19 DIAGNOSIS — F411 Generalized anxiety disorder: Secondary | ICD-10-CM

## 2022-07-19 DIAGNOSIS — F4310 Post-traumatic stress disorder, unspecified: Secondary | ICD-10-CM | POA: Diagnosis not present

## 2022-07-19 DIAGNOSIS — F332 Major depressive disorder, recurrent severe without psychotic features: Secondary | ICD-10-CM

## 2022-07-19 NOTE — Progress Notes (Signed)
Virtual Visit via Telephone Note   I connected with [name] [name] on 07/19/22 at  8:00 AM EST by telephone and verified that I am speaking with the correct person using two identifiers.   Location: Patient: Home Provider: Office   I discussed the limitations, risks, security and privacy concerns of performing an evaluation and management service by telephone and the availability of in person appointments. I also discussed with the patient that there may be a patient responsible charge related to this service. The patient expressed understanding and agreed to proceed.   THERAPIST PROGRESS NOTE   Session Time: 8:00 AM-8:30 AM   Participation Level: Active   Behavioral Response: CasualAlertDepressed   Type of Therapy: Individual Therapy   Treatment Goals addressed: Anger and Coping   Interventions: CBT, Motivational Interviewing, Solution Focused and Strength-based   Summary: [name] [name] is a 36 y.o. female who presents with Depression/ Anxiety/ PTSD. The OPT therapist worked with the patient for her ongoing OPT treatment. The OPT therapist utilized Motivational Interviewing to assist in creating therapeutic repore. The patient in the session was engaged and work in collaboration giving feedback about her triggers and symptoms over the past few weeks. The patient spoke about her recent Christmas holiday and interactions with family. The patient spoke about difficulty she has had with moving/transition to [place], [place] and noted she is currently living in at hotel. The OPT therapist utilized Cognitive Behavioral Therapy through cognitive restructuring as well as worked with the patient on coping strategies to assist in management of mood and thought processes. The OPT therapist promoted the patients work to challenge automatic negative thinking and promoted positive mindset.The OPT therapist worked with the patient providing support and psycho-education. The patient spoke about her med  therapy appointment with Dr. Adrian Blackwater her psychiatrist and a recent change with her medication that she feels has been helpful. The OPT therapist overviewed with the patient her upcoming health appointments as listed in patients MyChart.   Suicidal/Homicidal: Nowithout intent/plan   Therapist Response: The OPT therapist worked with the patient for the patients scheduled session. The patient was engaged in her session and gave feedback in relation to triggers, symptoms, and behavior responses over the past few weeks. The patient shared progression in management of physical and mental health and reviewed with the patient current external stressors. The OPT therapist worked with the patient utilizing an in session Cognitive Behavioral Therapy exercise. The patient was responsive in the session and verbalized, " I was hoping to move out to [place] but so far have not had any luck and for right now I am staying in a hotel ". The OPT gave support and encouragement with the patient who continues to work on using coping strategies with consistency. The patient worked with the patient promoting on going self check-ins throughout her week.  The patient spoke about feeling her recent medication adjustment from her psychiatrist has been helpful in mood management. The patient notes at this time she maintains her employmement at [employer] in [place] and her job is going well and that she may not move to [place]. The patient will continue to work on her health and attend all scheduled health appointments .The OPT therapist will continue treatment work with the patient in her next scheduled session.   Plan: Return again in 2/3 weeks.   Diagnosis:      Axis I: PTSD/ Depression/ Anxiety  Axis II: No diagnosis   Collaboration of Care: collaboration of care for this session review of med therapy with Dr. Tenny Craw.   Patient/Guardian was advised Release of Information must be obtained prior to  any record release in order to collaborate their care with an outside provider. Patient/Guardian was advised if they have not already done so to contact the registration department to sign all necessary forms in order for [place] to release information regarding their care.    Consent: Patient/Guardian gives verbal consent for treatment and assignment of benefits for services provided during this visit. Patient/Guardian expressed understanding and agreed to proceed.        I discussed the assessment and treatment plan with the patient. The patient was provided an opportunity to ask questions and all were answered. The patient agreed with the plan and demonstrated an understanding of the instructions.   The patient was advised to call back or seek an in-person evaluation if the symptoms worsen or if the condition fails to improve as anticipated.   I provided 30 minutes of non-face-to-face time during this encounter.   Suzan Garibaldi, LCSW   07/19/2022

## 2022-07-23 ENCOUNTER — Encounter: Payer: Self-pay | Admitting: Physician Assistant

## 2022-07-23 ENCOUNTER — Telehealth: Payer: Medicaid Other | Admitting: Physician Assistant

## 2022-07-23 DIAGNOSIS — R6889 Other general symptoms and signs: Secondary | ICD-10-CM

## 2022-07-23 MED ORDER — BENZONATATE 100 MG PO CAPS: 100.0000 mg | ORAL_CAPSULE | Freq: Three times a day (TID) | ORAL | 0 refills | Status: DC | PRN

## 2022-07-23 MED ORDER — OSELTAMIVIR PHOSPHATE 75 MG PO CAPS: 75.0000 mg | ORAL_CAPSULE | Freq: Two times a day (BID) | ORAL | 0 refills | Status: DC

## 2022-07-23 NOTE — Patient Instructions (Addendum)
[name], thank you for joining Piedad Climes, PA-C for today's virtual visit.  While this provider is not your primary care provider (PCP), if your PCP is located in our provider database this encounter information will be shared with them immediately following your visit.   A [facility] MyChart account gives you access to today's visit and all your visits, tests, and labs performed at [facility] " click here if you don't have a  MyChart account or go to [email or web address]  Consent: (Patient) FAYE STROHMAN provided verbal consent for this virtual visit at the beginning of the encounter.  Current Medications:  Current Outpatient Medications:    benzonatate (TESSALON) 100 MG capsule, Take 1 capsule (100 mg total) by mouth 3 (three) times daily as needed for cough., Disp: 30 capsule, Rfl: 0   oseltamivir (TAMIFLU) 75 MG capsule, Take 1 capsule (75 mg total) by mouth 2 (two) times daily., Disp: 10 capsule, Rfl: 0   acetaminophen (TYLENOL) 500 MG tablet, Take 1,000 mg by mouth daily., Disp: , Rfl:    albuterol (VENTOLIN HFA) 108 (90 Base) MCG/ACT inhaler, Inhale 2 puffs into the lungs every 6 (six) hours as needed for wheezing or shortness of breath., Disp: 8 g, Rfl: 0   Blood Pressure Monitor MISC, For regular home bp monitoring during pregnancy Needs large cuff, Disp: 1 each, Rfl: 0   cetirizine (ZYRTEC) 10 MG tablet, Take 1 tablet (10 mg total) by mouth daily., Disp: 90 tablet, Rfl: 0   montelukast (SINGULAIR) 10 MG tablet, Take 1 tablet (10 mg total) by mouth at bedtime., Disp: 30 tablet, Rfl: 2   NIFEdipine (PROCARDIA XL) 90 MG 24 hr tablet, Take 1 tablet (90 mg total) by mouth daily., Disp: 30 tablet, Rfl: 6   Norethindrone-Ethinyl Estradiol-Fe Biphas (LO LOESTRIN FE) 1 MG-10 MCG / 10 MCG tablet, Take 1 tablet by mouth daily. Take 1 daily by mouth, Disp: 28 tablet, Rfl: 11   pantoprazole (PROTONIX) 20 MG tablet, TAKE 1 TABLET BY MOUTH ONCE DAILY  AS NEEDED, Disp: 30 tablet, Rfl: 0   QUEtiapine (SEROQUEL) 50 MG tablet, Take half a tablet for one week at night. Then increase to 1 full tablet., Disp: 60 tablet, Rfl: 2   sertraline (ZOLOFT) 100 MG tablet, Take 1 tablet (100 mg total) by mouth at bedtime., Disp: 30 tablet, Rfl: 1   UNABLE TO FIND, Med Name: maternal belly band/ support belt [redacted] weeks pregnant, Disp: 1 each, Rfl: 0   Medications ordered in this encounter:  Meds ordered this encounter  Medications   oseltamivir (TAMIFLU) 75 MG capsule    Sig: Take 1 capsule (75 mg total) by mouth 2 (two) times daily.    Dispense:  10 capsule    Refill:  0    Order Specific Question:   Supervising Provider    Answer:   Merrilee Jansky [[id number]]   benzonatate (TESSALON) 100 MG capsule    Sig: Take 1 capsule (100 mg total) by mouth 3 (three) times daily as needed for cough.    Dispense:  30 capsule    Refill:  0    Order Specific Question:   Supervising Provider    Answer:   Merrilee Jansky [id number]     *If you need refills on other medications prior to your next appointment, please contact your pharmacy*  Follow-Up: Call back or seek an in-person evaluation if the symptoms worsen or if the condition fails to improve as anticipated.  [facility]  [place] [phone]  Other Instructions Please keep well-hydrated and try to get plenty of rest. If you have a humidifier, place it in the bedroom and run it at night. Start a saline nasal rinse for nasal congestion. You can consider use of a nasal steroid spray like Flonase or Nasacort OTC. You can alternate between Tylenol and Ibuprofen if needed for fever, body aches, headache and/or throat pain. Salt water-gargles and chloraseptic spray can be very beneficial for sore throat. Mucinex-DM for congestion or cough. Please take all prescribed medications as directed.  Remain out of work until [employer] for 24 hours without a fever-reducing medication, and you are feeling better.   You should mask until symptoms are resolved.  If anything worsens despite treatment, you need to be evaluated in-person. Please do not delay care.  Influenza, Adult Influenza is also called "the flu." It is an infection in the lungs, nose, and throat (respiratory tract). It spreads easily from person to person (is contagious). The flu causes symptoms that are like a cold, along with high fever and body aches. What are the causes? This condition is caused by the influenza virus. You can get the virus by: Breathing in droplets that are in the air after a person infected with the flu coughed or sneezed. Touching something that has the virus on it and then touching your mouth, nose, or eyes. What increases the risk? Certain things may make you more likely to get the flu. These include: Not washing your hands often. Having close contact with many people during cold and flu season. Touching your mouth, eyes, or nose without first washing your hands. Not getting a flu shot every year. You may have a higher risk for the flu, and serious problems, such as a lung infection (pneumonia), if you: Are older than 65. Are pregnant. Have a weakened disease-fighting system (immune system) because of a disease or because you are taking certain medicines. Have a long-term (chronic) condition, such as: Heart, kidney, or lung disease. Diabetes. Asthma. Have a liver disorder. Are very overweight (morbidly obese). Have anemia. What are the signs or symptoms? Symptoms usually begin suddenly and last 4-14 days. They may include: Fever and chills. Headaches, body aches, or muscle aches. Sore throat. Cough. Runny or stuffy (congested) nose. Feeling discomfort in your chest. Not wanting to eat as much as normal. Feeling weak or tired. Feeling dizzy. Feeling sick to your stomach or throwing up. How is this treated? If the flu is found early, you can be treated with antiviral medicine. This can help to  reduce how bad the illness is and how long it lasts. This may be given by mouth or through an IV tube. Taking care of yourself at home can help your symptoms get better. Your doctor may want you to: Take over-the-counter medicines. Drink plenty of fluids. The flu often goes away on its own. If you have very bad symptoms or other problems, you may be treated in a hospital. Follow these instructions at home:     Activity Rest as needed. Get plenty of sleep. Stay home from work or school as told by your doctor. Do not leave home until you do not have a fever for 24 hours without taking medicine. Leave home only to go to your doctor. Eating and drinking Take an ORS (oral rehydration solution). This is a drink that is sold at pharmacies and stores. Drink enough fluid to keep your pee pale yellow. Drink clear fluids in small  amounts as you are able. Clear fluids include: Water. Ice chips. Fruit juice mixed with water. Low-calorie sports drinks. Eat bland foods that are easy to digest. Eat small amounts as you are able. These foods include: Bananas. Applesauce. Rice. Lean meats. Toast. Crackers. Do not eat or drink: Fluids that have a lot of sugar or caffeine. Alcohol. Spicy or fatty foods. General instructions Take over-the-counter and prescription medicines only as told by your doctor. Use a cool mist humidifier to add moisture to the air in your home. This can make it easier for you to breathe. When using a cool mist humidifier, clean it daily. Empty water and replace with clean water. Cover your mouth and nose when you cough or sneeze. Wash your hands with soap and water often and for at least 20 seconds. This is also important after you cough or sneeze. If you cannot use soap and water, use alcohol-based hand sanitizer. Keep all follow-up visits. How is this prevented?  Get a flu shot every year. You may get the flu shot in late summer, fall, or winter. Ask your doctor when  you should get your flu shot. Avoid contact with people who are sick during fall and winter. This is cold and flu season. Contact a doctor if: You get new symptoms. You have: Chest pain. Watery poop (diarrhea). A fever. Your cough gets worse. You start to have more mucus. You feel sick to your stomach. You throw up. Get help right away if you: Have shortness of breath. Have trouble breathing. Have skin or nails that turn a bluish color. Have very bad pain or stiffness in your neck. Get a sudden headache. Get sudden pain in your face or ear. Cannot eat or drink without throwing up. These symptoms may represent a serious problem that is an emergency. Get medical help right away. Call your local emergency services (911 in the [place] Do not wait to see if the symptoms will go away. Do not drive yourself to the hospital. Summary Influenza is also called "the flu." It is an infection in the lungs, nose, and throat. It spreads easily from person to person. Take over-the-counter and prescription medicines only as told by your doctor. Getting a flu shot every year is the best way to not get the flu. This information is not intended to replace advice given to you by your health care provider. Make sure you discuss any questions you have with your health care provider. Document Revised: 02/25/2020 Document Reviewed: 02/25/2020 Elsevier Patient Education  [place].      If you have been instructed to have an in-person evaluation today at a local Urgent Care facility, please use the link below. It will take you to a list of all of our available [facility], including address, phone number and hours of operation. Please do not delay care.  [facility]  If you or a family member do not have a primary care provider, use the link below to schedule a visit and establish care. When you choose a [facility] primary care physician or advanced practice provider,  you gain a long-term partner in health. Find a Primary Care Provider  Learn more about [facility] in-office and virtual care options: [facility]

## 2022-07-23 NOTE — Telephone Encounter (Signed)
Telehealth visit completed -- see proper encounter.

## 2022-07-23 NOTE — Progress Notes (Addendum)
Virtual Visit Consent   [name] [name], you are scheduled for a virtual visit with a [facility] provider today. Just as with appointments in the office, your consent must be obtained to participate. Your consent will be active for this visit and any virtual visit you may have with one of our providers in the next 365 days. If you have a MyChart account, a copy of this consent can be sent to you electronically.  As this is a virtual visit, video technology does not allow for your provider to perform a traditional examination. This may limit your provider's ability to fully assess your condition. If your provider identifies any concerns that need to be evaluated in person or the need to arrange testing (such as labs, EKG, etc.), we will make arrangements to do so. Although advances in technology are sophisticated, we cannot ensure that it will always work on either your end or our end. If the connection with a video visit is poor, the visit may have to be switched to a telephone visit. With either a video or telephone visit, we are not always able to ensure that we have a secure connection.  By engaging in this virtual visit, you consent to the provision of healthcare and authorize for your insurance to be billed (if applicable) for the services provided during this visit. Depending on your insurance coverage, you may receive a charge related to this service.  I need to obtain your verbal consent now. Are you willing to proceed with your visit today? [name] [name] has provided verbal consent on 07/23/2022 for a virtual visit (video or telephone). Leeanne Rio, [place]  Date: 07/23/2022 6:05 PM  Virtual Visit via Video Note   I, Leeanne Rio, connected with  [name] [name]  ([id number], [birth year]) on 07/23/22 at  4:45 PM EST by a video-enabled telemedicine application and verified that I am speaking with the correct person using two identifiers.  Location: Patient: Virtual Visit Location  Patient: Home Provider: Virtual Visit Location Provider: Home Office   I discussed the limitations of evaluation and management by telemedicine and the availability of in person appointments. The patient expressed understanding and agreed to proceed.    History of Present Illness: [name] [name] is a 37 y.o. who identifies as a female who was assigned female at birth, and is being seen today for flu like symptoms starting within past 48 hours. Notes aches, chills, cough and nausea. BF with similar symptoms. Both are negative for COVID. Has been taking OTc Theraflu.   HPI: HPI  Problems:  Patient Active Problem List   Diagnosis Date Noted   Encounter for surveillance of contraceptive pills 07/02/2022   Pelvic cramping 07/02/2022   Irregular intermenstrual bleeding 07/02/2022   Long term current use of antipsychotic medication 06/24/2022   Cannabis use disorder 06/24/2022   GERD (gastroesophageal reflux disease) 06/12/2022   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   Severe episode of recurrent major depressive disorder, without psychotic features ([place]) 04/19/2022   PTSD (post-traumatic stress disorder) 04/19/2022   Gestational hypertension 02/14/2022   Asthma 01/08/2022   Alpha thalassemia silent carrier 12/04/2021   Bipolar disorder ([place]) 11/14/2021   Generalized anxiety disorder 11/14/2021   Previous cesarean section 11/14/2021   History of pyelonephritis 11/08/2021   Morbid obesity ([place])     Allergies:  Allergies  Allergen Reactions   Shellfish Allergy Anaphylaxis    Throat swelling   Medications:  Current Outpatient Medications:  benzonatate (TESSALON) 100 MG capsule, Take 1 capsule (100 mg total) by mouth 3 (three) times daily as needed for cough., Disp: 30 capsule, Rfl: 0   oseltamivir (TAMIFLU) 75 MG capsule, Take 1 capsule (75 mg total) by mouth 2 (two) times daily., Disp: 10 capsule, Rfl: 0   acetaminophen (TYLENOL) 500 MG tablet, Take 1,000 mg by mouth  daily., Disp: , Rfl:    albuterol (VENTOLIN HFA) 108 (90 Base) MCG/ACT inhaler, Inhale 2 puffs into the lungs every 6 (six) hours as needed for wheezing or shortness of breath., Disp: 8 g, Rfl: 0   Blood Pressure Monitor MISC, For regular home bp monitoring during pregnancy Needs large cuff, Disp: 1 each, Rfl: 0   cetirizine (ZYRTEC) 10 MG tablet, Take 1 tablet (10 mg total) by mouth daily., Disp: 90 tablet, Rfl: 0   montelukast (SINGULAIR) 10 MG tablet, Take 1 tablet (10 mg total) by mouth at bedtime., Disp: 30 tablet, Rfl: 2   NIFEdipine (PROCARDIA XL) 90 MG 24 hr tablet, Take 1 tablet (90 mg total) by mouth daily., Disp: 30 tablet, Rfl: 6   Norethindrone-Ethinyl Estradiol-Fe Biphas (LO LOESTRIN FE) 1 MG-10 MCG / 10 MCG tablet, Take 1 tablet by mouth daily. Take 1 daily by mouth, Disp: 28 tablet, Rfl: 11   pantoprazole (PROTONIX) 20 MG tablet, TAKE 1 TABLET BY MOUTH ONCE DAILY AS NEEDED, Disp: 30 tablet, Rfl: 0   QUEtiapine (SEROQUEL) 50 MG tablet, Take half a tablet for one week at night. Then increase to 1 full tablet., Disp: 60 tablet, Rfl: 2   sertraline (ZOLOFT) 100 MG tablet, Take 1 tablet (100 mg total) by mouth at bedtime., Disp: 30 tablet, Rfl: 1   UNABLE TO FIND, Med Name: maternal belly band/ support belt [redacted] weeks pregnant, Disp: 1 each, Rfl: 0  Observations/Objective: Patient is well-developed, well-nourished in no acute distress.  Resting comfortably at home.  Head is normocephalic, atraumatic.  No labored breathing. Speech is clear and coherent with logical content.  Patient is alert and oriented at baseline.   Assessment and Plan: 1. Flu-like symptoms - oseltamivir (TAMIFLU) 75 MG capsule; Take 1 capsule (75 mg total) by mouth 2 (two) times daily.  Dispense: 10 capsule; Refill: 0 - benzonatate (TESSALON) 100 MG capsule; Take 1 capsule (100 mg total) by mouth 3 (three) times daily as needed for cough.  Dispense: 30 capsule; Refill: 0  COVID negative. Classic symptoms.  Supportive measures, OTC medications reviewed. Tessalon per orders. Will begin Tamiflu.   Follow Up Instructions: I discussed the assessment and treatment plan with the patient. The patient was provided an opportunity to ask questions and all were answered. The patient agreed with the plan and demonstrated an understanding of the instructions.  A copy of instructions were sent to the patient via MyChart unless otherwise noted below.   The patient was advised to call back or seek an in-person evaluation if the symptoms worsen or if the condition fails to improve as anticipated.  Time:  I spent 10 minutes with the patient via telehealth technology discussing the above problems/concerns.    Leeanne Rio, PA-C

## 2022-07-23 NOTE — Addendum Note (Signed)
Addended by: Brunetta Jeans on: 07/23/2022 06:06 PM   Modules accepted: Orders, Level of Service

## 2022-07-25 ENCOUNTER — Encounter: Payer: Self-pay | Admitting: Family Medicine

## 2022-07-25 ENCOUNTER — Telehealth: Payer: Medicaid Other | Admitting: Family Medicine

## 2022-07-25 DIAGNOSIS — R051 Acute cough: Secondary | ICD-10-CM

## 2022-07-25 NOTE — Progress Notes (Signed)
[facility]   Needs to follow up in person for extended work note- as she needs to wait until her cough is gone before she can return to work.   Patient acknowledged agreement and understanding of the plan.

## 2022-08-02 ENCOUNTER — Telehealth: Payer: Self-pay | Admitting: Family Medicine

## 2022-08-02 ENCOUNTER — Ambulatory Visit: Payer: Medicaid Other | Admitting: Family Medicine

## 2022-08-02 ENCOUNTER — Encounter: Payer: Self-pay | Admitting: Family Medicine

## 2022-08-02 NOTE — Telephone Encounter (Signed)
Kindly proceed with the clinic policy, given the patient has been fully aware of the clinic's policy and subsequent actions.

## 2022-08-02 NOTE — Telephone Encounter (Signed)
Patient has missed 3 appts as of today (01/01/22, 04/10/22, 08/02/22). How would you like to proceed. Letter was sent at 2nd no show.

## 2022-08-07 ENCOUNTER — Other Ambulatory Visit: Payer: Self-pay | Admitting: Family Medicine

## 2022-08-07 DIAGNOSIS — K219 Gastro-esophageal reflux disease without esophagitis: Secondary | ICD-10-CM

## 2022-08-08 ENCOUNTER — Other Ambulatory Visit (HOSPITAL_COMMUNITY): Payer: Self-pay | Admitting: Psychiatry

## 2022-08-08 ENCOUNTER — Telehealth (HOSPITAL_COMMUNITY): Payer: Medicaid Other | Admitting: Psychiatry

## 2022-08-08 DIAGNOSIS — F411 Generalized anxiety disorder: Secondary | ICD-10-CM

## 2022-08-08 DIAGNOSIS — F332 Major depressive disorder, recurrent severe without psychotic features: Secondary | ICD-10-CM

## 2022-08-08 DIAGNOSIS — F5105 Insomnia due to other mental disorder: Secondary | ICD-10-CM

## 2022-08-08 DIAGNOSIS — F3161 Bipolar disorder, current episode mixed, mild: Secondary | ICD-10-CM

## 2022-08-08 MED ORDER — SERTRALINE HCL 100 MG PO TABS: 100.0000 mg | ORAL_TABLET | Freq: Every day | ORAL | 0 refills | Status: DC

## 2022-08-08 MED ORDER — QUETIAPINE FUMARATE 50 MG PO TABS: 50.0000 mg | ORAL_TABLET | Freq: Every day | ORAL | 0 refills | Status: DC

## 2022-08-08 NOTE — Progress Notes (Signed)
Link sent to patient at time of appointment. Patient arrived 77min late to appointment, exceeding window for start of appointment. This is the third no show since establishing care with [facility] (including missed intake appointment) since September 2023. Dismissal letter sent and 30 day supply of medication.

## 2022-08-08 NOTE — Addendum Note (Signed)
Addended by: Debby Bud on: 08/08/2022 10:41 AM   Modules accepted: Orders

## 2022-08-14 ENCOUNTER — Telehealth (HOSPITAL_COMMUNITY): Payer: Self-pay | Admitting: *Deleted

## 2022-08-14 ENCOUNTER — Telehealth: Payer: Self-pay | Admitting: Family Medicine

## 2022-08-14 NOTE — Telephone Encounter (Signed)
Patient called asking does our practice have 30 days to cover her meds until she finds an new provider since she was discharged on 01.12.2024 from our practice.   Need med refill and prior authorization.  QUEtiapine (SEROQUEL) 50 MG tablet [place], [place] - [place] [id number] [place], [place] [address] Phone: [phone]  Fax: [phone]

## 2022-08-14 NOTE — Telephone Encounter (Signed)
Dr. Nehemiah Settle prescribed Seroquel for the patient, kindly have her follow-up with behavioral health

## 2022-08-14 NOTE — Telephone Encounter (Signed)
Opened in Error

## 2022-08-16 ENCOUNTER — Ambulatory Visit (HOSPITAL_COMMUNITY): Payer: Medicaid Other | Admitting: Clinical

## 2022-08-19 NOTE — Telephone Encounter (Signed)
Attempted to call pt unable to leave a vm.

## 2022-08-29 ENCOUNTER — Encounter: Payer: Self-pay | Admitting: Obstetrics and Gynecology

## 2022-08-29 ENCOUNTER — Ambulatory Visit (INDEPENDENT_AMBULATORY_CARE_PROVIDER_SITE_OTHER): Payer: Medicaid Other | Admitting: Obstetrics and Gynecology

## 2022-08-29 VITALS — BP 128/79 | HR 97 | Ht 65.0 in | Wt 353.8 lb

## 2022-08-29 DIAGNOSIS — N921 Excessive and frequent menstruation with irregular cycle: Secondary | ICD-10-CM | POA: Insufficient documentation

## 2022-08-29 DIAGNOSIS — Z01419 Encounter for gynecological examination (general) (routine) without abnormal findings: Secondary | ICD-10-CM | POA: Insufficient documentation

## 2022-08-29 DIAGNOSIS — Z01411 Encounter for gynecological examination (general) (routine) with abnormal findings: Secondary | ICD-10-CM

## 2022-08-29 MED ORDER — MEGESTROL ACETATE 40 MG PO TABS: 40.0000 mg | ORAL_TABLET | Freq: Two times a day (BID) | ORAL | 0 refills | Status: AC

## 2022-08-29 MED ORDER — DESOGESTREL-ETHINYL ESTRADIOL 0.15-30 MG-MCG PO TABS: 1.0000 | ORAL_TABLET | Freq: Every day | ORAL | 11 refills | Status: DC

## 2022-08-29 NOTE — Progress Notes (Signed)
[name] [name] is a 37 y.o. [phone] female here for a routine annual gynecologic exam.  Current complaints: BTB on OCP's.    On second package of new OCP's. Has been bleeding for the last 3 weeks. Has not missed any pills  Gynecologic History No LMP recorded. Contraception: OCP (estrogen/progesterone) Last Pap: 2022. Results were: normal Last mammogram: NA.   Obstetric History OB History  Gravida Para Term Preterm AB Living  5 3 2 1 2 3   SAB IAB Ectopic Multiple Live Births  2 0 0 0 3    # Outcome Date GA Lbr Len/2nd Weight Sex Delivery Anes PTL Lv  5 Term 05/09/22 [redacted]w[redacted]d 09:05 / 00:25 7 lb 10.4 oz (3.47 kg) M Vag-Spont EPI  LIV     Birth Comments: WNL  4 Preterm 06/15/12 [redacted]w[redacted]d  5 lb 6 oz (2.438 kg) M CS-LTranv Spinal  LIV     Complications: Fetal Intolerance  3 Term 05/01/08 [redacted]w[redacted]d  10 lb 11 oz (4.848 kg) F Vag-Spont None N LIV  2 SAB              Birth Comments: System Generated. Please review and update pregnancy details.  1 SAB              Birth Comments: System Generated. Please review and update pregnancy details.    Past Medical History:  Diagnosis Date   Abnormal Pap smear    Alpha thalassemia silent carrier 12/04/2021   FOB___   Anxiety    Anxiety and depression 02/01/2022   Asthma    Bipolar 1 disorder ([place])    Borderline personality disorder ([place])    Depression    GERD (gastroesophageal reflux disease)    IUD (intrauterine device) in place 04/12/2013   Had IUD inserted 03/29/13 could not feel strings went to ER 9/18 and KUB saw IUD still could not see string, can't see now but seen in US   Kidney infection    Morbid obesity ([place])    Pregnancy    Pregnancy induced hypertension    PTSD (post-traumatic stress disorder)    UTI (urinary tract infection)     Past Surgical History:  Procedure Laterality Date   CESAREAN SECTION     HYSTEROSCOPY N/A 01/09/2021   Procedure: HYSTEROSCOPY;  Surgeon: Janyth Pupa, DO;  Location: [facility];  Service: Gynecology;   Laterality: N/A;   IUD REMOVAL N/A 01/09/2021   Procedure: INTRAUTERINE DEVICE (IUD) REMOVAL;  Surgeon: Janyth Pupa, DO;  Location: [facility];  Service: Gynecology;  Laterality: N/A;   TOOTH EXTRACTION      Current Outpatient Medications on File Prior to Visit  Medication Sig Dispense Refill   acetaminophen (TYLENOL) 500 MG tablet Take 1,000 mg by mouth daily.     albuterol (VENTOLIN HFA) 108 (90 Base) MCG/ACT inhaler Inhale 2 puffs into the lungs every 6 (six) hours as needed for wheezing or shortness of breath. 8 g 0   ARIPiprazole (ABILIFY) 15 MG tablet Take 15 mg by mouth at bedtime.     cetirizine (ZYRTEC) 10 MG tablet Take 1 tablet (10 mg total) by mouth daily. 90 tablet 0   montelukast (SINGULAIR) 10 MG tablet Take 1 tablet (10 mg total) by mouth at bedtime. 30 tablet 2   NIFEdipine (ADALAT CC) 90 MG 24 hr tablet Take 90 mg by mouth daily.     Norethindrone-Ethinyl Estradiol-Fe Biphas (LO LOESTRIN FE) 1 MG-10 MCG / 10 MCG tablet Take 1 tablet by mouth daily. Take 1 daily  by mouth 28 tablet 11   sertraline (ZOLOFT) 100 MG tablet Take 1 tablet (100 mg total) by mouth at bedtime. 30 tablet 0   traZODone (DESYREL) 50 MG tablet Take 50 mg by mouth at bedtime as needed.     Blood Pressure Monitor MISC For regular home bp monitoring during pregnancy Needs large cuff (Patient not taking: Reported on 08/29/2022) 1 each 0   QUEtiapine (SEROQUEL) 50 MG tablet Take 1 tablet (50 mg total) by mouth at bedtime. (Patient not taking: Reported on 08/29/2022) 30 tablet 0   No current facility-administered medications on file prior to visit.    Allergies  Allergen Reactions   Shellfish Allergy Anaphylaxis    Throat swelling    Social History   Socioeconomic History   Marital status: Significant Other    Spouse name: Not on file   Number of children: 2   Years of education: Not on file   Highest education level: Not on file  Occupational History   Not on file  Tobacco Use   Smoking status:  Former    Types: Cigarettes    Quit date: 05/21/2007    Years since quitting: 15.2   Smokeless tobacco: Never  Vaping Use   Vaping Use: Never used  Substance and Sexual Activity   Alcohol use: Not Currently    Comment: one during pregnancy. Occasionally with drink whole bottle of liquor. Socially will have 2-3 drinks.   Drug use: Not Currently    Types: Marijuana, Cocaine    Comment: 06/24/22 infrequent blunts. Prior to pregnancy ate edibles and 4-5 blunts per day but potentially more if drinking/playing video games.   Sexual activity: Yes    Partners: Male    Birth control/protection: Pill  Other Topics Concern   Not on file  Social History Narrative   Not on file   Social Determinants of Health   Financial Resource Strain: Medium Risk (08/29/2022)   Overall Financial Resource Strain (CARDIA)    Difficulty of Paying Living Expenses: Somewhat hard  Food Insecurity: No Food Insecurity (08/29/2022)   Hunger Vital Sign    Worried About Running Out of Food in the Last Year: Never true    Pink in the Last Year: Never true  Transportation Needs: No Transportation Needs (08/29/2022)   PRAPARE - Hydrologist (Medical): No    Lack of Transportation (Non-Medical): No  Physical Activity: Insufficiently Active (08/29/2022)   Exercise Vital Sign    Days of Exercise per Week: 3 days    Minutes of Exercise per Session: 40 min  Stress: Stress Concern Present (08/29/2022)   [place]    Feeling of Stress : To some extent  Social Connections: Socially Isolated (08/29/2022)   Social Connection and Isolation Panel [NHANES]    Frequency of Communication with Friends and Family: Once a week    Frequency of Social Gatherings with Friends and Family: Never    Attends Religious Services: Never    Marine scientist or Organizations: No    Attends Archivist Meetings: Never    Marital  Status: Divorced  Human resources officer Violence: Not At Risk (08/29/2022)   Humiliation, Afraid, Rape, and Kick questionnaire    Fear of Current or Ex-Partner: No    Emotionally Abused: No    Physically Abused: No    Sexually Abused: No    Family History  Problem Relation Age of Onset  Diabetes Paternal Grandfather    Hypertension Paternal Grandmother    Diabetes Paternal Grandmother    Hypertension Father    Diabetes Father    Congestive Heart Failure Father    ADD / ADHD Son    Diabetes Paternal Aunt    Diabetes Paternal Uncle     The following portions of the patient's history were reviewed and updated as appropriate: allergies, current medications, past family history, past medical history, past social history, past surgical history and problem list.  Review of Systems Pertinent items noted in HPI and remainder of comprehensive ROS otherwise negative.   Objective:  BP 128/79 (BP Location: Right Arm, Patient Position: Sitting, Cuff Size: Normal) Comment (BP Location): lower arm  Pulse 97   Ht 5\' 5"  (1.651 m)   Wt (!) 353 lb 12.8 oz (160.5 kg)   Breastfeeding No   BMI 58.88 kg/m  Chaperone present CONSTITUTIONAL: Well-developed, well-nourished female in no acute distress.  HENT:  Normocephalic, atraumatic, External right and left ear normal. Oropharynx is clear and moist EYES: Conjunctivae and EOM are normal. Pupils are equal, round, and reactive to light. No scleral icterus.  NECK: Normal range of motion, supple, no masses.  Normal thyroid.  SKIN: Skin is warm and dry. No rash noted. Not diaphoretic. No erythema. No pallor. [place]: Alert and oriented to person, place, and time. Normal reflexes, muscle tone coordination. No cranial nerve deficit noted. PSYCHIATRIC: Normal mood and affect. Normal behavior. Normal judgment and thought content. CARDIOVASCULAR: Normal heart rate noted, regular rhythm RESPIRATORY: Clear to auscultation bilaterally. Effort and breath sounds  normal, no problems with respiration noted. BREASTS: Symmetric in size. No masses, skin changes, nipple drainage, or lymphadenopathy. ABDOMEN: Soft, normal bowel sounds, no distention noted.  No tenderness, rebound or guarding.  PELVIC: Normal appearing external genitalia; normal appearing vaginal mucosa and cervix.  Vaginal bleeding noted.  Normal uterine size, no other palpable masses, no uterine or adnexal tenderness. Exam limited by pt habitus MUSCULOSKELETAL: Normal range of motion. No tenderness.  No cyanosis, clubbing, or edema.  2+ distal pulses.   Assessment:  Annual gynecologic examination BTB on OCP's   Plan:  BTB reviewed with pt. Tx options reviewed. Will Give Megace x 10 days and complete current OCP package and the switch to new OCP Will check labs and U/S. Pt declined STD testing  Routine preventative health maintenance measures emphasized. Please refer to After Visit Summary for other counseling recommendations.    Chancy Milroy, MD, [place] Attending [place] for [facility], [place]

## 2022-08-30 LAB — COMPREHENSIVE METABOLIC PANEL
ALT: 14 IU/L (ref 0–32)
AST: 15 IU/L (ref 0–40)
Albumin/Globulin Ratio: 1.5 (ref 1.2–2.2)
Albumin: 4 g/dL (ref 3.9–4.9)
Alkaline Phosphatase: 111 IU/L (ref 44–121)
BUN/Creatinine Ratio: 14 (ref 9–23)
BUN: 12 mg/dL (ref 6–20)
Bilirubin Total: <0.2 mg/dL (ref 0.0–1.2)
CO2: 22 mmol/L (ref 20–29)
Calcium: 9.1 mg/dL (ref 8.7–10.2)
Chloride: 103 mmol/L (ref 96–106)
Creatinine, Ser: 0.86 mg/dL (ref 0.57–1.00)
Globulin, Total: 2.7 g/dL (ref 1.5–4.5)
Glucose: 88 mg/dL (ref 70–99)
Potassium: 4.4 mmol/L (ref 3.5–5.2)
Sodium: 140 mmol/L (ref 134–144)
Total Protein: 6.7 g/dL (ref 6.0–8.5)
eGFR: 91 mL/min/{1.73_m2} (ref >59)

## 2022-08-30 LAB — DHEA-SULFATE: DHEA-SO4: 32.2 ug/dL — ABNORMAL LOW (ref 84.8–378.0)

## 2022-08-30 LAB — TSH: TSH: 1.91 u[IU]/mL (ref 0.450–4.500)

## 2022-09-11 ENCOUNTER — Ambulatory Visit
Admission: EM | Admit: 2022-09-11 | Discharge: 2022-09-11 | Disposition: A | Discharge status: Home / Self Care | Payer: Medicaid Other | Attending: Nurse Practitioner | Admitting: Nurse Practitioner

## 2022-09-11 ENCOUNTER — Encounter: Payer: Self-pay | Admitting: Emergency Medicine

## 2022-09-11 ENCOUNTER — Other Ambulatory Visit: Payer: Self-pay

## 2022-09-11 DIAGNOSIS — N3000 Acute cystitis without hematuria: Secondary | ICD-10-CM

## 2022-09-11 LAB — POCT URINALYSIS DIP (MANUAL ENTRY)
Glucose, UA: NEGATIVE mg/dL
Ketones, POC UA: NEGATIVE mg/dL
Nitrite, UA: POSITIVE — AB
Protein Ur, POC: 100 mg/dL — AB
Spec Grav, UA: >=1.03 — AB (ref 1.010–1.025)
Urobilinogen, UA: 0.2 E.U./dL
pH, UA: 5.5 (ref 5.0–8.0)

## 2022-09-11 LAB — POCT URINE PREGNANCY: Preg Test, Ur: NEGATIVE

## 2022-09-11 MED ORDER — NITROFURANTOIN MONOHYD MACRO 100 MG PO CAPS: 100.0000 mg | ORAL_CAPSULE | Freq: Two times a day (BID) | ORAL | 0 refills | Status: DC

## 2022-09-11 NOTE — ED Triage Notes (Signed)
Pt reports left lower back pain x3 weeks. Denies any known injury.

## 2022-09-11 NOTE — ED Provider Notes (Signed)
[facility]    CSN: [id number] Arrival date & time: 09/11/22  0902      History   Chief Complaint Chief Complaint  Patient presents with   Back Pain    HPI [name] [name] is a 37 y.o. female.   The history is provided by the patient.   The patient presents for complaints of left-sided low back pain.  Symptoms have been present for the past patient denies any known injury or trauma, swelling, deformity, numbness, tingling, radiation of pain, urinary frequency, urgency, dysuria, or hematuria.  She states that she notices the pain worsens when she is bending over to lift something up.  She also states that she has been lying on her abdomen when she is doing her schoolwork and when she has her head back, she feels the pain in her right lower back.  She reports she has been taking ibuprofen for her symptoms with some relief.  She currently rates her pain 5/10 at present. LMP 08/14/22.  Past Medical History:  Diagnosis Date   Abnormal Pap smear    Alpha thalassemia silent carrier 12/04/2021   FOB___   Anxiety    Anxiety and depression 02/01/2022   Asthma    Bipolar 1 disorder ([place])    Borderline personality disorder ([place])    Depression    GERD (gastroesophageal reflux disease)    IUD (intrauterine device) in place 04/12/2013   Had IUD inserted 03/29/13 could not feel strings went to ER 9/18 and KUB saw IUD still could not see string, can't see now but seen in US   Kidney infection    Morbid obesity ([place])    Pregnancy    Pregnancy induced hypertension    PTSD (post-traumatic stress disorder)    UTI (urinary tract infection)     Patient Active Problem List   Diagnosis Date Noted   Visit for routine gyn exam 08/29/2022   Breakthrough bleeding on birth control pills 08/29/2022   Encounter for surveillance of contraceptive pills 07/02/2022   Pelvic cramping 07/02/2022   Long term current use of antipsychotic medication 06/24/2022   Cannabis use disorder 06/24/2022    GERD (gastroesophageal reflux disease) 06/12/2022   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   Severe episode of recurrent major depressive disorder, without psychotic features ([place]) 04/19/2022   PTSD (post-traumatic stress disorder) 04/19/2022   Asthma 01/08/2022   Bipolar disorder ([place]) 11/14/2021   Generalized anxiety disorder 11/14/2021   Previous cesarean section 11/14/2021   History of pyelonephritis 11/08/2021   Morbid obesity ([place])     Past Surgical History:  Procedure Laterality Date   CESAREAN SECTION     HYSTEROSCOPY N/A 01/09/2021   Procedure: HYSTEROSCOPY;  Surgeon: Janyth Pupa, DO;  Location: [facility];  Service: Gynecology;  Laterality: N/A;   IUD REMOVAL N/A 01/09/2021   Procedure: INTRAUTERINE DEVICE (IUD) REMOVAL;  Surgeon: Janyth Pupa, DO;  Location: [facility];  Service: Gynecology;  Laterality: N/A;   TOOTH EXTRACTION      OB History     Gravida  5   Para  3   Term  2   Preterm  1   AB  2   Living  3      SAB  2   IAB  0   Ectopic  0   Multiple  0   Live Births  3            Home Medications    Prior to Admission medications  Medication Sig Start Date End Date Taking? Authorizing Provider  nitrofurantoin, macrocrystal-monohydrate, (MACROBID) 100 MG capsule Take 1 capsule (100 mg total) by mouth 2 (two) times daily. 09/11/22  Yes Duan Scharnhorst-Warren, Alda Lea, NP  acetaminophen (TYLENOL) 500 MG tablet Take 1,000 mg by mouth daily.    [provider]  albuterol (VENTOLIN HFA) 108 (90 Base) MCG/ACT inhaler Inhale 2 puffs into the lungs every 6 (six) hours as needed for wheezing or shortness of breath. 01/08/22   Alvira Monday, FNP  ARIPiprazole (ABILIFY) 15 MG tablet Take 15 mg by mouth at bedtime. 08/19/22   [provider]  Blood Pressure Monitor MISC For regular home bp monitoring during pregnancy Needs large cuff Patient not taking: Reported on 08/29/2022 11/14/21   Luvenia Redden, PA-C  cetirizine (ZYRTEC) 10  MG tablet Take 1 tablet (10 mg total) by mouth daily. 01/08/22   Alvira Monday, FNP  desogestrel-ethinyl estradiol (APRI) 0.15-30 MG-MCG tablet Take 1 tablet by mouth daily. 08/29/22   Chancy Milroy, MD  montelukast (SINGULAIR) 10 MG tablet Take 1 tablet (10 mg total) by mouth at bedtime. 07/01/22   Alvira Monday, FNP  NIFEdipine (ADALAT CC) 90 MG 24 hr tablet Take 90 mg by mouth daily. 08/08/22   [provider]  Norethindrone-Ethinyl Estradiol-Fe Biphas (LO LOESTRIN FE) 1 MG-10 MCG / 10 MCG tablet Take 1 tablet by mouth daily. Take 1 daily by mouth 07/02/22   Estill Dooms, NP  QUEtiapine (SEROQUEL) 50 MG tablet Take 1 tablet (50 mg total) by mouth at bedtime. Patient not taking: Reported on 08/29/2022 08/08/22   Jacquelynn Cree, MD  sertraline (ZOLOFT) 100 MG tablet Take 1 tablet (100 mg total) by mouth at bedtime. 08/08/22 09/07/22  Jacquelynn Cree, MD  traZODone (DESYREL) 50 MG tablet Take 50 mg by mouth at bedtime as needed. 08/19/22   [provider]    Family History Family History  Problem Relation Age of Onset   Diabetes Paternal Grandfather    Hypertension Paternal Grandmother    Diabetes Paternal Grandmother    Hypertension Father    Diabetes Father    Congestive Heart Failure Father    ADD / ADHD Son    Diabetes Paternal Aunt    Diabetes Paternal Uncle     Social History Social History   Tobacco Use   Smoking status: Former    Types: Cigarettes    Quit date: 05/21/2007    Years since quitting: 15.3   Smokeless tobacco: Never  Vaping Use   Vaping Use: Never used  Substance Use Topics   Alcohol use: Not Currently    Comment: one during pregnancy. Occasionally with drink whole bottle of liquor. Socially will have 2-3 drinks.   Drug use: Not Currently    Types: Marijuana, Cocaine    Comment: 06/24/22 infrequent blunts. Prior to pregnancy ate edibles and 4-5 blunts per day but potentially more if drinking/playing video games.     Allergies    Shellfish allergy   Review of Systems Review of Systems Per HPI  Physical Exam Triage Vital Signs ED Triage Vitals  Enc Vitals Group     BP 09/11/22 1043 126/79     Pulse Rate 09/11/22 1043 89     Resp 09/11/22 1043 20     Temp 09/11/22 1043 98 F (36.7 C)     Temp Source 09/11/22 1043 Oral     SpO2 09/11/22 1043 95 %     Weight --  Height --      Head Circumference --      Peak Flow --      Pain Score 09/11/22 1042 5     Pain Loc --      Pain Edu? --      Excl. in [place]? --    No data found.  Updated Vital Signs BP 126/79 (BP Location: Right Arm)   Pulse 89   Temp 98 F (36.7 C) (Oral)   Resp 20   LMP 08/14/2022 (Approximate) Comment: swtiched birth controls and reports has not reached the end of the pill pack yet.  SpO2 95%   Breastfeeding No   Visual Acuity Right Eye Distance:   Left Eye Distance:   Bilateral Distance:    Right Eye Near:   Left Eye Near:    Bilateral Near:     Physical Exam Vitals and nursing note reviewed.  Constitutional:      General: She is not in acute distress.    Appearance: Normal appearance.  HENT:     Head: Normocephalic.  Eyes:     Extraocular Movements: Extraocular movements intact.     Pupils: Pupils are equal, round, and reactive to light.  Cardiovascular:     Rate and Rhythm: Normal rate and regular rhythm.     Pulses: Normal pulses.     Heart sounds: Normal heart sounds.  Pulmonary:     Effort: Pulmonary effort is normal. No respiratory distress.     Breath sounds: Normal breath sounds. No stridor. No wheezing, rhonchi or rales.  Abdominal:     General: Bowel sounds are normal. There is no distension.     Palpations: Abdomen is soft.     Tenderness: There is no abdominal tenderness. There is left CVA tenderness. There is no right CVA tenderness or guarding.  Musculoskeletal:     Cervical back: Normal range of motion.  Lymphadenopathy:     Cervical: No cervical adenopathy.  Skin:    General: Skin is warm  and dry.  Neurological:     General: No focal deficit present.     Mental Status: She is alert and oriented to person, place, and time.  Psychiatric:        Mood and Affect: Mood normal.        Behavior: Behavior normal.      UC Treatments / Results  Labs (all labs ordered are listed, but only abnormal results are displayed) Labs Reviewed  POCT URINALYSIS DIP (MANUAL ENTRY) - Abnormal; Notable for the following components:      Result Value   Clarity, UA hazy (*)    Bilirubin, UA small (*)    Spec Grav, UA >=1.030 (*)    Blood, UA small (*)    Protein Ur, POC =100 (*)    Nitrite, UA Positive (*)    Leukocytes, UA Trace (*)    All other components within normal limits  URINE CULTURE  POCT URINE PREGNANCY    EKG   Radiology No results found.  Procedures Procedures (including critical care time)  Medications Ordered in UC Medications - No data to display  Initial Impression / Assessment and Plan / UC Course  I have reviewed the triage vital signs and the nursing notes.  Pertinent labs & imaging results that were available during my care of the patient were reviewed by me and considered in my medical decision making (see chart for details).  Patient is well-appearing, she is in no acute distress, vital signs  are stable.  Urinalysis is positive for nitrates and leukocytes, results consistent with acute cystitis.  Will treat patient with Macrobid 100 mg twice daily for the next 5 days.  Urine culture is pending.  Supportive care recommendations were provided to the patient along with strict indications for follow-up in the emergency department may be indicated.  Patient is in agreement with this plan of care and verbalizes understanding.  All questions were answered.  Patient stable for discharge.  Final Clinical Impressions(s) / UC Diagnoses   Final diagnoses:  Acute cystitis without hematuria     Discharge Instructions      Your urinalysis shows that you have a  urinary tract infection.  A urine culture has been ordered to ensure you are being treated with the appropriate antibiotic.  If the medication needs to be changed, you will be contacted. Take medication as prescribed.  Take over-the-counter Tylenol as needed for pain or discomfort. Increase fluids and allow for plenty of rest.  To drink at least 8-10 8 ounce glasses of water while symptoms persist. Recommend developing a toileting schedule or you are peeing every 2 hours. If you are sexually active, please avoid approximately 15 to 20 minutes after each sexual encounter. Avoid caffeine while symptoms persist.  This includes tea, soda, and coffee. If you develop worsening back pain while taking the medication along with new symptoms of fever, chills, abdominal pain, or generalized fatigue, please follow-up in the emergency department for further evaluation. Follow-up as needed.       ED Prescriptions     Medication Sig Dispense Auth. Provider   nitrofurantoin, macrocrystal-monohydrate, (MACROBID) 100 MG capsule Take 1 capsule (100 mg total) by mouth 2 (two) times daily. 10 capsule Zanyah Lentsch-Warren, Alda Lea, NP      PDMP not reviewed this encounter.   Tish Men, NP 09/11/22 1137

## 2022-09-11 NOTE — Discharge Instructions (Addendum)
Your urinalysis shows that you have a urinary tract infection.  A urine culture has been ordered to ensure you are being treated with the appropriate antibiotic.  If the medication needs to be changed, you will be contacted. Take medication as prescribed.  Take over-the-counter Tylenol as needed for pain or discomfort. Increase fluids and allow for plenty of rest.  To drink at least 8-10 8 ounce glasses of water while symptoms persist. Recommend developing a toileting schedule or you are peeing every 2 hours. If you are sexually active, please avoid approximately 15 to 20 minutes after each sexual encounter. Avoid caffeine while symptoms persist.  This includes tea, soda, and coffee. If you develop worsening back pain while taking the medication along with new symptoms of fever, chills, abdominal pain, or generalized fatigue, please follow-up in the emergency department for further evaluation. Follow-up as needed.

## 2022-09-13 ENCOUNTER — Ambulatory Visit (HOSPITAL_COMMUNITY)
Admission: RE | Admit: 2022-09-13 | Discharge: 2022-09-13 | Disposition: A | Discharge status: Home / Self Care | Payer: Medicaid Other | Source: Ambulatory Visit | Attending: Obstetrics and Gynecology | Admitting: Obstetrics and Gynecology

## 2022-09-13 DIAGNOSIS — N921 Excessive and frequent menstruation with irregular cycle: Secondary | ICD-10-CM | POA: Diagnosis present

## 2022-09-13 LAB — URINE CULTURE: Culture: >=100000 — AB

## 2022-09-17 ENCOUNTER — Other Ambulatory Visit (HOSPITAL_COMMUNITY): Payer: Self-pay

## 2022-09-23 ENCOUNTER — Telehealth: Payer: Medicaid Other | Admitting: Family Medicine

## 2022-09-23 DIAGNOSIS — R051 Acute cough: Secondary | ICD-10-CM | POA: Diagnosis not present

## 2022-09-23 DIAGNOSIS — R0981 Nasal congestion: Secondary | ICD-10-CM | POA: Diagnosis not present

## 2022-09-23 MED ORDER — BENZONATATE 100 MG PO CAPS: 100.0000 mg | ORAL_CAPSULE | Freq: Three times a day (TID) | ORAL | 0 refills | Status: DC | PRN

## 2022-09-23 MED ORDER — FLUTICASONE PROPIONATE 50 MCG/ACT NA SUSP: 2.0000 | Freq: Every day | NASAL | 0 refills | Status: AC

## 2022-09-23 MED ORDER — AZELASTINE HCL 0.1 % NA SOLN: 2.0000 | Freq: Two times a day (BID) | NASAL | 12 refills | Status: DC

## 2022-09-23 NOTE — Patient Instructions (Signed)
[name], thank you for joining [name], NP for today's virtual visit.  While this provider is not your primary care provider (PCP), if your PCP is located in our provider database this encounter information will be shared with them immediately following your visit.   [facility] account gives you access to today's visit and all your visits, tests, and labs performed at [facility] " click here if you don't have a [facility] account or go to [email or web address]  Consent: (Patient) [name] [name] provided verbal consent for this virtual visit at the beginning of the encounter.  Current Medications:  Current Outpatient Medications:    azelastine (ASTELIN) 0.1 % nasal spray, Place 2 sprays into both nostrils 2 (two) times daily. Use in each nostril as directed, Disp: 30 mL, Rfl: 12   benzonatate (TESSALON) 100 MG capsule, Take 1 capsule (100 mg total) by mouth 3 (three) times daily as needed for cough., Disp: 20 capsule, Rfl: 0   fluticasone (FLONASE) 50 MCG/ACT nasal spray, Place 2 sprays into both nostrils daily., Disp: 16 g, Rfl: 0   acetaminophen (TYLENOL) 500 MG tablet, Take 1,000 mg by mouth daily., Disp: , Rfl:    albuterol (VENTOLIN HFA) 108 (90 Base) MCG/ACT inhaler, Inhale 2 puffs into the lungs every 6 (six) hours as needed for wheezing or shortness of breath., Disp: 8 g, Rfl: 0   ARIPiprazole (ABILIFY) 15 MG tablet, Take 15 mg by mouth at bedtime., Disp: , Rfl:    Blood Pressure Monitor MISC, For regular home bp monitoring during pregnancy Needs large cuff (Patient not taking: Reported on 08/29/2022), Disp: 1 each, Rfl: 0   cetirizine (ZYRTEC) 10 MG tablet, Take 1 tablet (10 mg total) by mouth daily., Disp: 90 tablet, Rfl: 0   desogestrel-ethinyl estradiol (APRI) 0.15-30 MG-MCG tablet, Take 1 tablet by mouth daily., Disp: 28 tablet, Rfl: 11   montelukast (SINGULAIR) 10 MG tablet, Take 1 tablet (10 mg total) by mouth at bedtime., Disp: 30  tablet, Rfl: 2   NIFEdipine (ADALAT CC) 90 MG 24 hr tablet, Take 90 mg by mouth daily., Disp: , Rfl:    nitrofurantoin, macrocrystal-monohydrate, (MACROBID) 100 MG capsule, Take 1 capsule (100 mg total) by mouth 2 (two) times daily., Disp: 10 capsule, Rfl: 0   Norethindrone-Ethinyl Estradiol-Fe Biphas (LO LOESTRIN FE) 1 MG-10 MCG / 10 MCG tablet, Take 1 tablet by mouth daily. Take 1 daily by mouth, Disp: 28 tablet, Rfl: 11   QUEtiapine (SEROQUEL) 50 MG tablet, Take 1 tablet (50 mg total) by mouth at bedtime. (Patient not taking: Reported on 08/29/2022), Disp: 30 tablet, Rfl: 0   sertraline (ZOLOFT) 100 MG tablet, Take 1 tablet (100 mg total) by mouth at bedtime., Disp: 30 tablet, Rfl: 0   traZODone (DESYREL) 50 MG tablet, Take 50 mg by mouth at bedtime as needed., Disp: , Rfl:    Medications ordered in this encounter:  Meds ordered this encounter  Medications   fluticasone (FLONASE) 50 MCG/ACT nasal spray    Sig: Place 2 sprays into both nostrils daily.    Dispense:  16 g    Refill:  0    Order Specific Question:   Supervising Provider    Answer:   Chase Picket [id number]   azelastine (ASTELIN) 0.1 % nasal spray    Sig: Place 2 sprays into both nostrils 2 (two) times daily. Use in each nostril as directed    Dispense:  30 mL    Refill:  12    Order Specific Question:   Supervising Provider    Answer:   Chase Picket [id number]   benzonatate (TESSALON) 100 MG capsule    Sig: Take 1 capsule (100 mg total) by mouth 3 (three) times daily as needed for cough.    Dispense:  20 capsule    Refill:  0    Order Specific Question:   Supervising Provider    Answer:   Chase Picket [id number]     *If you need refills on other medications prior to your next appointment, please contact your pharmacy*  Follow-Up: Call back or seek an in-person evaluation if the symptoms worsen or if the condition fails to improve as anticipated.  [facility] [phone]  Other  Instructions  Allergic Rhinitis, Adult  Allergic rhinitis is a reaction to allergens. Allergens are things that can cause an allergic reaction. This condition affects the lining inside the nose (mucous membrane). There are two types of allergic rhinitis: Seasonal. This type is also called hay fever. It happens only during some times of the year. Perennial. This type can happen at any time of the year. This condition cannot be spread from person to person (is not contagious). It can be mild, bad, or very bad. It can develop at any age and may be outgrown. What are the causes? Pollen from grasses, trees, and weeds. Other causes can be: Dust mites. Smoke. Mold. Car fumes. The pee (urine), spit, or dander of pets. Dander is dead skin cells from a pet. What increases the risk? You are more likely to develop this condition if: You have allergies in your family. You have problems like allergies in your family. You may have: Swelling of parts of your eyes and eyelids. Asthma. This affects how you breathe. Long-term redness and swelling on your skin. Food allergies. What are the signs or symptoms? The main symptom of this condition is a runny or stuffy nose (nasal congestion). Other symptoms may include: Sneezing or coughing. Itching and tearing of your eyes. Mucus that drips down the back of your throat (postnasal drip). This may cause a sore throat. Trouble sleeping. Feeling tired. Headache. How is this treated? There is no cure for this condition. You should avoid things that you are allergic to. Treatment can help to relieve symptoms. This may include: Medicines that block allergy symptoms, such as anti-inflammatories or antihistamines. These may be given as a shot, nasal spray, or pill. Avoiding things you are allergic to. Medicines that give you some of what you are allergic to over time. This is called immunotherapy. It is done if other treatments do not help. You may  get: Shots. Medicine under your tongue. Stronger medicines, if other treatments do not help. Follow these instructions at home: Avoiding allergens Find out what things you are allergic to and avoid them. To do this, try these things: If you get allergies any time of year: Replace carpet with wood, tile, or vinyl flooring. Carpet can trap pet dander and dust. Do not smoke. Do not allow smoking in your home. Change your heating and air conditioning filters at least once a month. If you get allergies only some times of the year: Keep windows closed when you can. Plan things to do outside when pollen counts are lowest. Check pollen counts before you plan things to do outside. When you come indoors, change your clothes and shower before you sit on furniture or bedding. If you are allergic to a  pet: Keep the pet out of your bedroom. Vacuum, sweep, and dust often. General instructions Take over-the-counter and prescription medicines only as told by your doctor. Drink enough fluid to keep your pee pale yellow. Where to find more information American Academy of Allergy, Asthma & Immunology: aaaai.org Contact a doctor if: You have a fever. You get a cough that does not go away. You make high-pitched whistling sounds when you breathe, most often when you breathe out (wheeze). Your symptoms slow you down. Your symptoms stop you from doing your normal things each day. Get help right away if: You are short of breath. This symptom may be an emergency. Get help right away. Call 911. Do not wait to see if the symptom will go away. Do not drive yourself to the hospital. This information is not intended to replace advice given to you by your health care provider. Make sure you discuss any questions you have with your health care provider. Document Revised: 03/18/2022 Document Reviewed: 03/18/2022 Elsevier Patient Education  [place].     If you have been instructed to have an  in-person evaluation today at a local Urgent Care facility, please use the link below. It will take you to a list of all of our available [facility], including address, phone number and hours of operation. Please do not delay care.  [facility]  If you or a family member do not have a primary care provider, use the link below to schedule a visit and establish care. When you choose a [facility] primary care physician or advanced practice provider, you gain a long-term partner in health. Find a Primary Care Provider  Learn more about [facility]'s in-office and virtual care options: [facility]

## 2022-09-23 NOTE — Progress Notes (Signed)
Virtual Visit Consent   [name] [name], you are scheduled for a virtual visit with a [facility] provider today. Just as with appointments in the office, your consent must be obtained to participate. Your consent will be active for this visit and any virtual visit you may have with one of our providers in the next 365 days. If you have a MyChart account, a copy of this consent can be sent to you electronically.  As this is a virtual visit, video technology does not allow for your provider to perform a traditional examination. This may limit your provider's ability to fully assess your condition. If your provider identifies any concerns that need to be evaluated in person or the need to arrange testing (such as labs, EKG, etc.), we will make arrangements to do so. Although advances in technology are sophisticated, we cannot ensure that it will always work on either your end or our end. If the connection with a video visit is poor, the visit may have to be switched to a telephone visit. With either a video or telephone visit, we are not always able to ensure that we have a secure connection.  By engaging in this virtual visit, you consent to the provision of healthcare and authorize for your insurance to be billed (if applicable) for the services provided during this visit. Depending on your insurance coverage, you may receive a charge related to this service.  I need to obtain your verbal consent now. Are you willing to proceed with your visit today? [name] [name] has provided verbal consent on 09/23/2022 for a virtual visit (video or telephone). [name], NP  Date: 09/23/2022 11:07 AM  Virtual Visit via Video Note   I, [name], connected with  [name] [name]  ([id number], [birth year]) on 09/23/22 at 11:15 AM EST by a video-enabled telemedicine application and verified that I am speaking with the correct person using two identifiers.  Location: Patient: Virtual Visit Location Patient:  Home Provider: Virtual Visit Location Provider: Home Office   I discussed the limitations of evaluation and management by telemedicine and the availability of in person appointments. The patient expressed understanding and agreed to proceed.    History of Present Illness: [name] [name] is a 37 y.o. who identifies as a female who was assigned female at birth, and is being seen today for head cold symptoms.   Onset was Saturday- onset with congestion Associated symptoms are congestion and stuffy nose, bad headaches, sore throat, felt feverish Modifying factors are  none  Denies chest pain, shortness of breath, fevers, chills, ear pain  Exposure to sick contacts- unknown COVID test: none  Problems:  Patient Active Problem List   Diagnosis Date Noted   Visit for routine gyn exam 08/29/2022   Breakthrough bleeding on birth control pills 08/29/2022   Encounter for surveillance of contraceptive pills 07/02/2022   Pelvic cramping 07/02/2022   Long term current use of antipsychotic medication 06/24/2022   Cannabis use disorder 06/24/2022   GERD (gastroesophageal reflux disease) 06/12/2022   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   Severe episode of recurrent major depressive disorder, without psychotic features ([place]) 04/19/2022   PTSD (post-traumatic stress disorder) 04/19/2022   Asthma 01/08/2022   Bipolar disorder ([place]) 11/14/2021   Generalized anxiety disorder 11/14/2021   Previous cesarean section 11/14/2021   History of pyelonephritis 11/08/2021   Morbid obesity ([facility])     Allergies:  Allergies  Allergen Reactions   Shellfish Allergy Anaphylaxis  Throat swelling   Medications:  Current Outpatient Medications:    acetaminophen (TYLENOL) 500 MG tablet, Take 1,000 mg by mouth daily., Disp: , Rfl:    albuterol (VENTOLIN HFA) 108 (90 Base) MCG/ACT inhaler, Inhale 2 puffs into the lungs every 6 (six) hours as needed for wheezing or shortness of breath., Disp: 8 g, Rfl:  0   ARIPiprazole (ABILIFY) 15 MG tablet, Take 15 mg by mouth at bedtime., Disp: , Rfl:    Blood Pressure Monitor MISC, For regular home bp monitoring during pregnancy Needs large cuff (Patient not taking: Reported on 08/29/2022), Disp: 1 each, Rfl: 0   cetirizine (ZYRTEC) 10 MG tablet, Take 1 tablet (10 mg total) by mouth daily., Disp: 90 tablet, Rfl: 0   desogestrel-ethinyl estradiol (APRI) 0.15-30 MG-MCG tablet, Take 1 tablet by mouth daily., Disp: 28 tablet, Rfl: 11   montelukast (SINGULAIR) 10 MG tablet, Take 1 tablet (10 mg total) by mouth at bedtime., Disp: 30 tablet, Rfl: 2   NIFEdipine (ADALAT CC) 90 MG 24 hr tablet, Take 90 mg by mouth daily., Disp: , Rfl:    nitrofurantoin, macrocrystal-monohydrate, (MACROBID) 100 MG capsule, Take 1 capsule (100 mg total) by mouth 2 (two) times daily., Disp: 10 capsule, Rfl: 0   Norethindrone-Ethinyl Estradiol-Fe Biphas (LO LOESTRIN FE) 1 MG-10 MCG / 10 MCG tablet, Take 1 tablet by mouth daily. Take 1 daily by mouth, Disp: 28 tablet, Rfl: 11   QUEtiapine (SEROQUEL) 50 MG tablet, Take 1 tablet (50 mg total) by mouth at bedtime. (Patient not taking: Reported on 08/29/2022), Disp: 30 tablet, Rfl: 0   sertraline (ZOLOFT) 100 MG tablet, Take 1 tablet (100 mg total) by mouth at bedtime., Disp: 30 tablet, Rfl: 0   traZODone (DESYREL) 50 MG tablet, Take 50 mg by mouth at bedtime as needed., Disp: , Rfl:   Observations/Objective: Patient is well-developed, well-nourished in no acute distress.  Resting comfortably  at home.  Head is normocephalic, atraumatic.  No labored breathing.  Speech is clear and coherent with logical content.  Patient is alert and oriented at baseline.    Assessment and Plan:   1. Congestion of nasal sinus  - fluticasone (FLONASE) 50 MCG/ACT nasal spray; Place 2 sprays into both nostrils daily.  Dispense: 16 g; Refill: 0 - azelastine (ASTELIN) 0.1 % nasal spray; Place 2 sprays into both nostrils 2 (two) times daily. Use in each nostril  as directed  Dispense: 30 mL; Refill: 12  2. Acute cough  - benzonatate (TESSALON) 100 MG capsule; Take 1 capsule (100 mg total) by mouth 3 (three) times daily as needed for cough.  Dispense: 20 capsule; Refill: 0   -suspected allergy related given time of year and symptoms -meds as above added to daily meds -follow up if not improved- in person  -work note  Patient acknowledged agreement and understanding of the plan.    Reviewed side effects, risks and benefits of medication.    Past Medical, Surgical, Social History, Allergies, and Medications have been Reviewed.    Follow Up Instructions: I discussed the assessment and treatment plan with the patient. The patient was provided an opportunity to ask questions and all were answered. The patient agreed with the plan and demonstrated an understanding of the instructions.  A copy of instructions were sent to the patient via MyChart unless otherwise noted below.    The patient was advised to call back or seek an in-person evaluation if the symptoms worsen or if the condition fails to improve as  anticipated.  Time:  I spent 10 minutes with the patient via telehealth technology discussing the above problems/concerns.    [name], NP

## 2022-10-05 NOTE — Progress Notes (Deleted)
[facility]  Follow Up Note   Date:  10/06/2022   ID:  [name] [name], DOB Sep 26, 1985, MRN [id number]  PCP:  Kathyrn Lass   [place] Providers Cardiologist:  Kate Sable, MD (Inactive)  Electrophysiologist:  None        Referring MD: Alvira Monday, FNP   Chief Complaint: palpitations  History of Present Illness:    [name] [name] is a 37 y.o. female [id number] who returns for follow up of palpitations.  Patient has a history of bipolar disorder and morbid obesity. She is currently [redacted]w[redacted]d pregnant.    Saw Dr. Bronson Ing in 2020 for atypical chest pain. TTE showed LVEF 50-55%, normal RV, normal PASP, no significant valve disease. Symptoms were thought to be related to anxiety.   [facility] on 12[facility]14[facility]21 for significant SOB. TTE was ordered but not completed. Zio monitor was only 1 day and showed average HR 101, no ectopy. Patient triggered events correlated with sinus tach.    Was seen in video visit on 12/2021 where she continued to have SOB on exertion but palpitations had improved and HR were running in the 90s.   Last deen in clinic on 03/2022 where she was feeling well   Today, ***   Prior CV Studies Reviewed: The following studies were reviewed today: Zio Monitor 02/21/22:   Patch wear time was 1 day and 6 hours   Predominant rhythm was sinus with average HR 101   No ectopy detected   Patient triggered events correlate with sinus tachycardia   No sustained arrhythmias or significant pauses     Patch Wear Time:  1 days and 6 hours (2023-06-01T15:36:18-0400 to 2023-06-02T21:50:56-0400)   Patient had a min HR of 55 bpm, max HR of 145 bpm, and avg HR of 101 bpm. Predominant underlying rhythm was Sinus Rhythm. No Isolated SVEs, SVE Couplets, or SVE Triplets were present. No Isolated VEs, VE Couplets, or VE Triplets were present.    TTE 11/2018: IMPRESSIONS     1. The left ventricle has low normal systolic  function, with an ejection  fraction of 50-55%. Image quality not adequate to assess focal wall  motion. The cavity size was normal. Left ventricular diastolic parameters  were normal.   2. The right ventricle has normal systolic function. The cavity was  normal. There is no increase in right ventricular wall thickness. Right  ventricular systolic pressure normal with an estimated pressure of 23.2  mmHg.   3. The mitral valve is grossly normal.   4. The tricuspid valve is grossly normal.   5. The aortic valve is tricuspid.   6. The aortic root is normal in size and structure.     Past Medical History:  Diagnosis Date   Abnormal Pap smear    Alpha thalassemia silent carrier 12/04/2021   FOB___   Anxiety    Anxiety and depression 02/01/2022   Asthma    Bipolar 1 disorder ([place])    Borderline personality disorder ([place])    Depression    GERD (gastroesophageal reflux disease)    IUD (intrauterine device) in place 04/12/2013   Had IUD inserted 03/29/13 could not feel strings went to ER 9/18 and KUB saw IUD still could not see string, can't see now but seen in US   Kidney infection    Morbid obesity ([place])    Pregnancy    Pregnancy induced hypertension    PTSD (post-traumatic stress disorder)    UTI (urinary tract infection)  Past Surgical History:  Procedure Laterality Date   CESAREAN SECTION     HYSTEROSCOPY N/A 01/09/2021   Procedure: HYSTEROSCOPY;  Surgeon: Janyth Pupa, DO;  Location: [facility];  Service: Gynecology;  Laterality: N/A;   IUD REMOVAL N/A 01/09/2021   Procedure: INTRAUTERINE DEVICE (IUD) REMOVAL;  Surgeon: Janyth Pupa, DO;  Location: [facility];  Service: Gynecology;  Laterality: N/A;   TOOTH EXTRACTION     { Click here to update PMH, PSH, OB Hx then refresh note  :1}   OB History     Gravida  5   Para  3   Term  2   Preterm  1   AB  2   Living  3      SAB  2   IAB  0   Ectopic  0   Multiple  0   Live Births  3           { Click here  to update OB Charting then refresh note  :1}    Current Medications: No outpatient medications have been marked as taking for the 10/09/22 encounter (Appointment) with Freada Bergeron, MD.     Allergies:   Shellfish allergy   Social History   Socioeconomic History   Marital status: Significant Other    Spouse name: Not on file   Number of children: 2   Years of education: Not on file   Highest education level: Not on file  Occupational History   Not on file  Tobacco Use   Smoking status: Former    Types: Cigarettes    Quit date: 05/21/2007    Years since quitting: 15.3   Smokeless tobacco: Never  Vaping Use   Vaping Use: Never used  Substance and Sexual Activity   Alcohol use: Not Currently    Comment: one during pregnancy. Occasionally with drink whole bottle of liquor. Socially will have 2-3 drinks.   Drug use: Not Currently    Types: Marijuana, Cocaine    Comment: 06/24/22 infrequent blunts. Prior to pregnancy ate edibles and 4-5 blunts per day but potentially more if drinking/playing video games.   Sexual activity: Yes    Partners: Male    Birth control/protection: Pill  Other Topics Concern   Not on file  Social History Narrative   Not on file   Social Determinants of Health   Financial Resource Strain: Medium Risk (08/29/2022)   Overall Financial Resource Strain (CARDIA)    Difficulty of Paying Living Expenses: Somewhat hard  Food Insecurity: No Food Insecurity (08/29/2022)   Hunger Vital Sign    Worried About Running Out of Food in the Last Year: Never true    [place] in the Last Year: Never true  Transportation Needs: No Transportation Needs (08/29/2022)   PRAPARE - Hydrologist (Medical): No    Lack of Transportation (Non-Medical): No  Physical Activity: Insufficiently Active (08/29/2022)   Exercise Vital Sign    Days of Exercise per Week: 3 days    Minutes of Exercise per Session: 40 min  Stress: Stress Concern Present  (08/29/2022)   [place]    Feeling of Stress : To some extent  Social Connections: Socially Isolated (08/29/2022)   Social Connection and Isolation Panel [NHANES]    Frequency of Communication with Friends and Family: Once a week    Frequency of Social Gatherings with Friends and Family: Never    Attends  Religious Services: Never    Active Member of Clubs or Organizations: No    Attends Archivist Meetings: Never    Marital Status: Divorced  { Click here to update SDOH then refresh :1}    Family History  Problem Relation Age of Onset   Diabetes Paternal Grandfather    Hypertension Paternal Grandmother    Diabetes Paternal Grandmother    Hypertension Father    Diabetes Father    Congestive Heart Failure Father    ADD / ADHD Son    Diabetes Paternal Aunt    Diabetes Paternal Uncle    { Click here to update FH then refresh note    :1}   ROS:   Please see the history of present illness.    *** All other systems reviewed and are negative.   Labs/EKG Reviewed:    EKG:   EKG is *** ordered today.  The ekg ordered today demonstrates ***  Recent Labs: 05/09/2022: Hemoglobin 10.6; Platelets 210 08/29/2022: ALT 14; BUN 12; Creatinine, Ser 0.86; Potassium 4.4; Sodium 140; TSH 1.910   Recent Lipid Panel No results found for: "CHOL", "TRIG", "HDL", "CHOLHDL", "LDLCALC", "LDLDIRECT"  Physical Exam:    VS:  LMP 08/14/2022 (Approximate) Comment: swtiched birth controls and reports has not reached the end of the pill pack yet.    Wt Readings from Last 3 Encounters:  08/29/22 (!) 353 lb 12.8 oz (160.5 kg)  07/01/22 (!) 348 lb 1.9 oz (157.9 kg)  06/12/22 (!) 349 lb 0.6 oz (158.3 kg)     GEN: *** Well nourished, well developed in no acute distress HEENT: Normal NECK: No JVD; No carotid bruits LYMPHATICS: No lymphadenopathy CARDIAC: ***RRR, no murmurs, rubs, gallops RESPIRATORY:  Clear to auscultation  without rales, wheezing or rhonchi  ABDOMEN: Soft, non-tender, non-distended MUSCULOSKELETAL:  No edema; No deformity  SKIN: Warm and dry NEUROLOGIC:  Alert and oriented x 3 PSYCHIATRIC:  Normal affect    Risk Assessment/Risk Calculators:   { Click to calculate CARPREG II - THEN refresh note :1}    { Click to caclulate Mod WHO Class of CV Risk - THEN refresh note :1}     { Click for [id number] Score - THEN Refresh Note    [id number]      ASSESSMENT & PLAN:    #Palpitations: Overall improved. No chest pain, lightheadedness or syncope. Zio with only 1 day worth of data but shows no significant arrhythmias. Will continue to monitor. -Zio reassuring and overall symptoms improved -Discussed importance of hydration -Small, frequent meals   #Dyspnea on Exertion: Has been ongoing since the start of her pregnancy. Has been persistent. May be multifactorial in the setting of hormonal shifts in pregnancy, obesity and possible asthma. Zio monitor unremarkable. Never scheduled TTE. Overall, symptoms stable and declined scheduling TTE at this time.   #Gestational HTN: *** -Continue nifedipine 90mg  daily   #High Risk Pregnancy: #Morbid Obesity: Has discussed with OBGYN. Currently on ASA 81mg  daily. Will need continued prevention efforts going forward.   There are no Patient Instructions on file for this visit.   Dispo:  No follow-ups on file.   Medication Adjustments/Labs and Tests Ordered: Current medicines are reviewed at length with the patient today.  Concerns regarding medicines are outlined above.  Tests Ordered: No orders of the defined types were placed in this encounter.  Medication Changes: No orders of the defined types were placed in this encounter.

## 2022-10-08 ENCOUNTER — Other Ambulatory Visit (HOSPITAL_COMMUNITY): Payer: Self-pay | Admitting: *Deleted

## 2022-10-08 DIAGNOSIS — R059 Cough, unspecified: Secondary | ICD-10-CM

## 2022-10-09 ENCOUNTER — Ambulatory Visit: Payer: Medicaid Other | Attending: Cardiology | Admitting: Cardiology

## 2022-10-09 ENCOUNTER — Ambulatory Visit: Payer: Medicaid Other | Admitting: Family Medicine

## 2022-10-11 ENCOUNTER — Ambulatory Visit (HOSPITAL_COMMUNITY)
Admission: RE | Admit: 2022-10-11 | Discharge: 2022-10-11 | Disposition: A | Discharge status: Home / Self Care | Payer: Medicaid Other | Source: Ambulatory Visit | Attending: *Deleted | Admitting: *Deleted

## 2022-10-11 DIAGNOSIS — R059 Cough, unspecified: Secondary | ICD-10-CM | POA: Diagnosis present

## 2022-10-31 ENCOUNTER — Ambulatory Visit (INDEPENDENT_AMBULATORY_CARE_PROVIDER_SITE_OTHER): Payer: Medicaid Other | Admitting: Pulmonary Disease

## 2022-10-31 ENCOUNTER — Encounter (HOSPITAL_BASED_OUTPATIENT_CLINIC_OR_DEPARTMENT_OTHER): Payer: Self-pay | Admitting: Pulmonary Disease

## 2022-10-31 VITALS — BP 124/86 | HR 95 | Temp 97.9°F | Ht 65.0 in | Wt 362.0 lb

## 2022-10-31 DIAGNOSIS — J4530 Mild persistent asthma, uncomplicated: Secondary | ICD-10-CM

## 2022-10-31 DIAGNOSIS — R0683 Snoring: Secondary | ICD-10-CM

## 2022-10-31 DIAGNOSIS — R0609 Other forms of dyspnea: Secondary | ICD-10-CM | POA: Diagnosis not present

## 2022-10-31 NOTE — Assessment & Plan Note (Signed)
Continue Symbicort for now. Use albuterol for rescue. Check PFTs for airway obstruction

## 2022-10-31 NOTE — Assessment & Plan Note (Signed)
Surprisingly sleep study was negative in 2020. Snoring is loud and disrupts her sleep and she reports frequent nocturnal awakenings. Will reassess with a home sleep test

## 2022-10-31 NOTE — Patient Instructions (Signed)
X Amb sat  X PFTs  X Home sleep test

## 2022-10-31 NOTE — Assessment & Plan Note (Addendum)
Not sure if she would be eligible for GLP-1 agents for weight loss or may need a referral to bariatric surgery Eventually may need breast reduction but will need to lose weight first

## 2022-10-31 NOTE — Assessment & Plan Note (Signed)
Likely related to morbid obesity although will have to rule out usual suspects Will obtain PFTs for obstructive lung disease.  If this is negative we will proceed with checking BNP,  d-dimer and consider echo

## 2022-10-31 NOTE — Progress Notes (Signed)
Subjective:    Patient ID: [name] [name], female    DOB: 11/18/1985, 37 y.o.   MRN: [id number]  HPI  37 year old morbidly obese woman presents for evaluation of dyspnea on exertion and sleep disordered breathing. [facility] in [place] She is referred for abnormal chest x-ray which shows minimal right basal atelectasis.  She reports dyspnea on exertion ongoing for more than a year.  This was attributed to being pregnant last year but then she delivered a healthy baby boy 6 months ago and dyspnea has persisted.  Chest x-ray was obtained which I reviewed that shows minimal right basilar atelectasis.  She denies wheezing coughing. She smokes 3 to 4 cigarettes daily, less than 10 pack years but states she has been unable to smoke due to shortness of breath.  She was turned down for breast reduction due to her morbid obesity she has been prescribed Symbicort and albuterol diagnosed with asthma which she denies any exacerbation requiring prednisone or ED visits.   She was evaluated for sleep apnea with a negative home sleep test in 2020.  She wakes up several times during the night and then it is difficult for her to fall back asleep.  Bedtime can be from midnight to 2 AM, sleep latency can sometimes be hours, she sleeps on her side with 1 pillow, reports multiple nocturnal awakenings with prolonged sleep latency and can be out of bed as late as 10 AM or noon still feeling tired with dryness of mouth.  Loud snoring has been noted by her boyfriend. She takes 100 mg of trazodone and has taken up to 40 mg of melatonin at bedtime but this does not work There is no history suggestive of cataplexy, sleep paralysis or parasomnias  Labs -TSH normal 08/2022  Significant tests/ events reviewed  HST 01/2019-357 pounds-AHI 2.3/h  Past Medical History:  Diagnosis Date   Abnormal Pap smear    Alpha thalassemia silent carrier 12/04/2021   FOB___   Anxiety    Anxiety and  depression 02/01/2022   Asthma    Bipolar 1 disorder    Borderline personality disorder    Depression    GERD (gastroesophageal reflux disease)    IUD (intrauterine device) in place 04/12/2013   Had IUD inserted 03/29/13 could not feel strings went to ER 9/18 and KUB saw IUD still could not see string, can't see now but seen in US   Kidney infection    Morbid obesity    Pregnancy    Pregnancy induced hypertension    PTSD (post-traumatic stress disorder)    UTI (urinary tract infection)      Past Surgical History:  Procedure Laterality Date   CESAREAN SECTION     HYSTEROSCOPY N/A 01/09/2021   Procedure: HYSTEROSCOPY;  Surgeon: Myna Hidalgo, DO;  Location: [facility];  Service: Gynecology;  Laterality: N/A;   IUD REMOVAL N/A 01/09/2021   Procedure: INTRAUTERINE DEVICE (IUD) REMOVAL;  Surgeon: Myna Hidalgo, DO;  Location: [facility];  Service: Gynecology;  Laterality: N/A;   TOOTH EXTRACTION      Allergies  Allergen Reactions   Shellfish Allergy Anaphylaxis    Throat swelling    Social History   Socioeconomic History   Marital status: Significant Other    Spouse name: Not on file   Number of children: 2   Years of education: Not on file   Highest education level: Not on file  Occupational History   Not on file  Tobacco Use  Smoking status: Former    Types: Cigarettes    Quit date: 05/21/2007    Years since quitting: 15.4   Smokeless tobacco: Never  Vaping Use   Vaping Use: Never used  Substance and Sexual Activity   Alcohol use: Not Currently    Comment: one during pregnancy. Occasionally with drink whole bottle of liquor. Socially will have 2-3 drinks.   Drug use: Not Currently    Types: Marijuana, Cocaine    Comment: 06/24/22 infrequent blunts. Prior to pregnancy ate edibles and 4-5 blunts per day but potentially more if drinking/playing video games.   Sexual activity: Yes    Partners: Male    Birth control/protection: Pill  Other Topics Concern   Not on file   Social History Narrative   Not on file   Social Determinants of Health   Financial Resource Strain: Medium Risk (08/29/2022)   Overall Financial Resource Strain (CARDIA)    Difficulty of Paying Living Expenses: Somewhat hard  Food Insecurity: No Food Insecurity (08/29/2022)   Hunger Vital Sign    Worried About Running Out of Food in the Last Year: Never true    Ran Out of Food in the Last Year: Never true  Transportation Needs: No Transportation Needs (08/29/2022)   PRAPARE - Administrator, Civil Service (Medical): No    Lack of Transportation (Non-Medical): No  Physical Activity: Insufficiently Active (08/29/2022)   Exercise Vital Sign    Days of Exercise per Week: 3 days    Minutes of Exercise per Session: 40 min  Stress: Stress Concern Present (08/29/2022)   [employer] of Occupational Health - Occupational Stress Questionnaire    Feeling of Stress : To some extent  Social Connections: Socially Isolated (08/29/2022)   Social Connection and Isolation Panel [NHANES]    Frequency of Communication with Friends and Family: Once a week    Frequency of Social Gatherings with Friends and Family: Never    Attends Religious Services: Never    Database administrator or Organizations: No    Attends Banker Meetings: Never    Marital Status: Divorced  Catering manager Violence: Not At Risk (08/29/2022)   Humiliation, Afraid, Rape, and Kick questionnaire    Fear of Current or Ex-Partner: No    Emotionally Abused: No    Physically Abused: No    Sexually Abused: No     Family History  Problem Relation Age of Onset   Diabetes Paternal Grandfather    Hypertension Paternal Grandmother    Diabetes Paternal Grandmother    Hypertension Father    Diabetes Father    Congestive Heart Failure Father    ADD / ADHD Son    Diabetes Paternal Aunt    Diabetes Paternal Uncle      Review of Systems Shortness of breath with activity and even at rest Nonproductive  cough Irregular heartbeat Tooth problems Headaches and nasal congestion Anxiety and depression Feet swelling    Objective:   Physical Exam  Gen. Pleasant, obese, in no distress, normal affect ENT - no pallor,icterus, no post nasal drip, class 2-3 airway Neck: No JVD, no thyromegaly, no carotid bruits Lungs: no use of accessory muscles, no dullness to percussion, decreased without rales or rhonchi  Cardiovascular: Rhythm regular, heart sounds  normal, no murmurs or gallops, 1+ peripheral edema Abdomen: soft and non-tender, no hepatosplenomegaly, BS normal. Musculoskeletal: No deformities, no cyanosis or clubbing Neuro:  alert, non focal, no tremors       Assessment &  Plan:

## 2022-11-01 LAB — RPR: RPR Ser Ql: NONREACTIVE

## 2022-11-01 LAB — HEPATITIS C ANTIBODY: Hep C Virus Ab: NONREACTIVE

## 2022-11-01 LAB — HIV ANTIBODY (ROUTINE TESTING W REFLEX): HIV Screen 4th Generation wRfx: NONREACTIVE

## 2022-11-12 ENCOUNTER — Ambulatory Visit: Payer: Medicaid Other | Admitting: Family Medicine

## 2022-11-13 ENCOUNTER — Encounter (HOSPITAL_BASED_OUTPATIENT_CLINIC_OR_DEPARTMENT_OTHER): Payer: Medicaid Other

## 2022-11-19 ENCOUNTER — Ambulatory Visit (INDEPENDENT_AMBULATORY_CARE_PROVIDER_SITE_OTHER): Payer: Medicaid Other | Admitting: Adult Health

## 2022-11-19 ENCOUNTER — Encounter: Payer: Self-pay | Admitting: Adult Health

## 2022-11-19 VITALS — Ht 65.0 in | Wt 365.0 lb

## 2022-11-19 DIAGNOSIS — N939 Abnormal uterine and vaginal bleeding, unspecified: Secondary | ICD-10-CM | POA: Diagnosis not present

## 2022-11-19 MED ORDER — MEGESTROL ACETATE 40 MG PO TABS: ORAL_TABLET | ORAL | 0 refills | Status: DC

## 2022-11-19 MED ORDER — NORGESTIMATE-ETH ESTRADIOL 0.25-35 MG-MCG PO TABS: 1.0000 | ORAL_TABLET | Freq: Every day | ORAL | 11 refills | Status: DC

## 2022-11-19 NOTE — Progress Notes (Signed)
Patient ID: [name] [name], female   DOB: [birth year], 37 y.o.   MRN: [id number]   TELEHEALTH GYNECOLOGY VISIT ENCOUNTER NOTE  [facility] location[facility]   Patient location: Home  I connected with [name] on 11/19/22 at 10:10 AM EDT by telephone and verified that I am speaking with the correct person using two identifiers. Patient was unable to do MyChart audiovisual encounter due to technical difficulties, she tried several times.    I discussed the limitations, risks, security and privacy concerns of performing an evaluation and management service by telephone and the availability of in person appointments. I also discussed with the patient that there may be a patient responsible charge related to this service. The patient expressed understanding and agreed to proceed.   History:  [name] [name] is a 37 y.o. [phone] female being evaluated today for abnormal bleeding, bleeding since 10/25/22 has not missed any pills, she is on Apri. She denies any abnormal vaginal discharge,  pelvic pain or other concerns.    Past Medical History:  Diagnosis Date   Abnormal Pap smear    Alpha thalassemia silent carrier 12/04/2021   FOB___   Anxiety    Anxiety and depression 02/01/2022   Asthma    Bipolar 1 disorder ([facility])    Borderline personality disorder ([facility])    Depression    GERD (gastroesophageal reflux disease)    IUD (intrauterine device) in place 04/12/2013   Had IUD inserted 03/29/13 could not feel strings went to ER 9/18 and KUB saw IUD still could not see string, can't see now but seen in US   Kidney infection    Morbid obesity ([facility])    Pregnancy    Pregnancy induced hypertension    PTSD (post-traumatic stress disorder)    UTI (urinary tract infection)    Past Surgical History:  Procedure Laterality Date   CESAREAN SECTION     HYSTEROSCOPY N/A 01/09/2021   Procedure: HYSTEROSCOPY;  Surgeon: Myna Hidalgo, DO;  Location: [facility];  Service:  Gynecology;  Laterality: N/A;   IUD REMOVAL N/A 01/09/2021   Procedure: INTRAUTERINE DEVICE (IUD) REMOVAL;  Surgeon: Myna Hidalgo, DO;  Location: [facility];  Service: Gynecology;  Laterality: N/A;   TOOTH EXTRACTION     The following portions of the patient's history were reviewed and updated as appropriate: allergies, current medications, past family history, past medical history, past social history, past surgical history and problem list.   Health Maintenance:  Normal pap and negative HRHPV on 12/26/20.  Review of Systems:  Pertinent items noted in HPI and remainder of comprehensive ROS otherwise negative.  Physical Exam:   General:  Alert, oriented and cooperative.   Mental Status: Normal mood and affect perceived. Normal judgment and thought content.  Physical exam deferred due to nature of the encounter Ht 5\' 5"  (1.651 m)   Wt (!) 365 lb (165.6 kg)   LMP 10/25/2022   Breastfeeding No   BMI 60.74 kg/m   Fall risk is low  Upstream - 11/19/22 1021       Pregnancy Intention Screening   Does the patient want to become pregnant in the next year? No    Does the patient's partner want to become pregnant in the next year? No    Would the patient like to discuss contraceptive options today? No      Contraception Wrap Up   Current Method Oral Contraceptive    End Method Oral Contraceptive  Labs and Imaging No results found for this or any previous visit (from the past 336 hour(s)). No results found.    Assessment and Plan:     1. Abnormal uterine bleeding (AUB) Stop Apri now Will rx megace for 4 days to stop bleeding and start new OC Sunday and use back up for 1 month Follow up with me in 3 months for ROS Meds ordered this encounter  Medications   norgestimate-ethinyl estradiol (ORTHO-CYCLEN) 0.25-35 MG-MCG tablet    Sig: Take 1 tablet by mouth daily.    Dispense:  28 tablet    Refill:  11    Order Specific Question:   Supervising Provider    Answer:   Duane Lope H [[id number]]   megestrol (MEGACE) 40 MG tablet    Sig: Take 2 daily for 4 days    Dispense:  8 tablet    Refill:  0    Order Specific Question:   Supervising Provider    Answer:   Duane Lope H [[id number]]          I discussed the assessment and treatment plan with the patient. The patient was provided an opportunity to ask questions and all were answered. The patient agreed with the plan and demonstrated an understanding of the instructions.   The patient was advised to call back or seek an in-person evaluation/go to the ED if the symptoms worsen or if the condition fails to improve as anticipated.  I provided 10 minutes of non-face-to-face time during this encounter.   Cyril Mourning, NP [facility] [employer], [facility]

## 2022-11-25 ENCOUNTER — Encounter (HOSPITAL_BASED_OUTPATIENT_CLINIC_OR_DEPARTMENT_OTHER): Payer: Medicaid Other

## 2022-12-12 ENCOUNTER — Encounter (HOSPITAL_BASED_OUTPATIENT_CLINIC_OR_DEPARTMENT_OTHER): Payer: Medicaid Other

## 2022-12-27 ENCOUNTER — Encounter: Payer: Self-pay | Admitting: Obstetrics and Gynecology

## 2022-12-27 ENCOUNTER — Ambulatory Visit (INDEPENDENT_AMBULATORY_CARE_PROVIDER_SITE_OTHER): Payer: Medicaid Other | Admitting: Obstetrics and Gynecology

## 2022-12-27 VITALS — BP 120/78 | HR 87 | Ht 67.0 in | Wt 361.0 lb

## 2022-12-27 DIAGNOSIS — I10 Essential (primary) hypertension: Secondary | ICD-10-CM | POA: Diagnosis not present

## 2022-12-27 DIAGNOSIS — N939 Abnormal uterine and vaginal bleeding, unspecified: Secondary | ICD-10-CM | POA: Diagnosis not present

## 2022-12-27 MED ORDER — MEGESTROL ACETATE 40 MG PO TABS: 40.0000 mg | ORAL_TABLET | Freq: Two times a day (BID) | ORAL | 2 refills | Status: DC

## 2022-12-27 NOTE — Progress Notes (Signed)
Ms Dandridge presents with continue problems with AUB. See prior office note Was placed on Megace and bleeding stopped but returned when started her new OCP's H/O HTN with pregnancy and morbid obesity  PE AF VSS Lungs clear Heart RRR Abd soft + BS obese  A/P AUB        CHTN        Morbid obesity  Will stop OCP's. Start Megace 40 mg po bid. Referral to [facility] for management of CHTN and obesity Return in 1 month to gage response to Megace and to discuss long term management options

## 2022-12-31 ENCOUNTER — Telehealth: Payer: Self-pay | Admitting: Primary Care

## 2022-12-31 NOTE — Telephone Encounter (Signed)
Patient would like to schedule a time to pick up her sleep machine.  Please call patient to discuss further.  CB# [phone]

## 2023-01-02 NOTE — Telephone Encounter (Signed)
Let pt know that you will re-authorize & pt will be rescheduled one more time.

## 2023-01-02 NOTE — Telephone Encounter (Signed)
Patient's authorization for HST expires on Monday, she would like to have it resubmitted and resch for a later time.   Patient has no showed all HST scheduled appts.

## 2023-01-02 NOTE — Telephone Encounter (Signed)
Patient no showed for her appt today I have made Raven aware

## 2023-01-24 ENCOUNTER — Encounter (HOSPITAL_BASED_OUTPATIENT_CLINIC_OR_DEPARTMENT_OTHER): Payer: Medicaid Other

## 2023-01-27 ENCOUNTER — Ambulatory Visit (HOSPITAL_COMMUNITY): Admission: RE | Admit: 2023-01-27 | Payer: Medicaid Other | Source: Ambulatory Visit

## 2023-01-27 ENCOUNTER — Ambulatory Visit: Payer: Medicaid Other | Admitting: Obstetrics & Gynecology

## 2023-02-05 ENCOUNTER — Encounter (INDEPENDENT_AMBULATORY_CARE_PROVIDER_SITE_OTHER): Payer: Medicaid Other | Admitting: Internal Medicine

## 2023-02-10 NOTE — Telephone Encounter (Signed)
Resubmitted auth for approval   Transaction ID: [id number] no case ID provided as of yet

## 2023-06-26 ENCOUNTER — Other Ambulatory Visit: Payer: Self-pay

## 2023-06-26 ENCOUNTER — Ambulatory Visit (HOSPITAL_COMMUNITY)
Admission: EM | Admit: 2023-06-26 | Discharge: 2023-06-26 | Disposition: A | Discharge status: Home / Self Care | Payer: Medicaid Other

## 2023-06-26 ENCOUNTER — Encounter (HOSPITAL_COMMUNITY): Payer: Self-pay | Admitting: Emergency Medicine

## 2023-06-26 ENCOUNTER — Telehealth: Payer: Medicaid Other | Admitting: Physician Assistant

## 2023-06-26 DIAGNOSIS — N939 Abnormal uterine and vaginal bleeding, unspecified: Secondary | ICD-10-CM

## 2023-06-26 LAB — POCT URINE PREGNANCY: Preg Test, Ur: NEGATIVE

## 2023-06-26 MED ORDER — MEGESTROL ACETATE 40 MG PO TABS: 40.0000 mg | ORAL_TABLET | Freq: Two times a day (BID) | ORAL | 0 refills | Status: DC

## 2023-06-26 NOTE — ED Triage Notes (Signed)
Pt reports constant vaginal bleeding outside of LMP x3 weeks. LMP was 10/27-11/1.Pt reports no pain or clots. Describes it as watery, heavy, and constant. States this happened Feb 2024 and OB/Gyn said it was breakthrough bleeding from birth control. Was prescribed megestrol at that time and it helped. Pt's unable to get into OB/GYN until 08/12/22.

## 2023-06-26 NOTE — ED Provider Notes (Signed)
[facility]    CSN: [id number] Arrival date & time: 06/26/23  1427      History   Chief Complaint Chief Complaint  Patient presents with   Vaginal Bleeding    HPI [name] [name] is a 37 y.o. female.  3 week history of vaginal bleeding Daily bleeding. Fills a pad every 3-4 hours Not having dizziness, shortness of breath, palpitations. Denies history of anemia. She has history of AUB, on megase several times which helps. Her next ob/gyn appointment is end of January   She does take OCP. She stops taking when she has to use megase Has tried several variations to help with bleed  Past Medical History:  Diagnosis Date   Abnormal Pap smear    Alpha thalassemia silent carrier 12/04/2021   FOB___   Anxiety    Anxiety and depression 02/01/2022   Asthma    Bipolar 1 disorder (HCC)    Borderline personality disorder (HCC)    Depression    GERD (gastroesophageal reflux disease)    IUD (intrauterine device) in place 04/12/2013   Had IUD inserted 03/29/13 could not feel strings went to ER 9/18 and KUB saw IUD still could not see string, can't see now but seen in US   Kidney infection    Morbid obesity (HCC)    Pregnancy    Pregnancy induced hypertension    PTSD (post-traumatic stress disorder)    UTI (urinary tract infection)     Patient Active Problem List   Diagnosis Date Noted   Abnormal uterine bleeding (AUB) 11/19/2022   DOE (dyspnea on exertion) 10/31/2022   Visit for routine gyn exam 08/29/2022   Breakthrough bleeding on birth control pills 08/29/2022   Encounter for surveillance of contraceptive pills 07/02/2022   Long term current use of antipsychotic medication 06/24/2022   Cannabis use disorder 06/24/2022   GERD (gastroesophageal reflux disease) 06/12/2022   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   Severe episode of recurrent major depressive disorder, without psychotic features (HCC) 04/19/2022   PTSD (post-traumatic stress disorder)  04/19/2022   Asthma 01/08/2022   Bipolar disorder (HCC) 11/14/2021   Generalized anxiety disorder 11/14/2021   Previous cesarean section 11/14/2021   History of pyelonephritis 11/08/2021   Morbid obesity (HCC)    Snoring 08/03/2013    Past Surgical History:  Procedure Laterality Date   CESAREAN SECTION     HYSTEROSCOPY N/A 01/09/2021   Procedure: HYSTEROSCOPY;  Surgeon: Myna Hidalgo, DO;  Location: [facility];  Service: Gynecology;  Laterality: N/A;   IUD REMOVAL N/A 01/09/2021   Procedure: INTRAUTERINE DEVICE (IUD) REMOVAL;  Surgeon: Myna Hidalgo, DO;  Location: [facility];  Service: Gynecology;  Laterality: N/A;   TOOTH EXTRACTION      OB History     Gravida  5   Para  3   Term  2   Preterm  1   AB  2   Living  3      SAB  2   IAB  0   Ectopic  0   Multiple  0   Live Births  3            Home Medications    Prior to Admission medications   Medication Sig Start Date End Date Taking? Authorizing Provider  albuterol (VENTOLIN HFA) 108 (90 Base) MCG/ACT inhaler Inhale 2 puffs into the lungs every 6 (six) hours as needed for wheezing or shortness of breath. 01/08/22  Yes Gilmore Laroche, FNP  ARIPiprazole (  ABILIFY) 15 MG tablet Take 15 mg by mouth at bedtime. 08/19/22  Yes [provider]  buPROPion (WELLBUTRIN XL) 300 MG 24 hr tablet Take 300 mg by mouth daily.   Yes [provider]  cetirizine (ZYRTEC) 10 MG tablet Take 1 tablet (10 mg total) by mouth daily. 01/08/22  Yes Gilmore Laroche, FNP  fluticasone (FLONASE) 50 MCG/ACT nasal spray Place 2 sprays into both nostrils daily. 09/23/22  Yes Freddy Finner, NP  megestrol (MEGACE) 40 MG tablet Take 1 tablet (40 mg total) by mouth 2 (two) times daily. 06/26/23  Yes Kainalu Heggs, Lurena Joiner, PA-C  pantoprazole (PROTONIX) 20 MG tablet Take 20 mg by mouth daily. 02/25/23  Yes [provider]  SYMBICORT 80-4.5 MCG/ACT inhaler Inhale 2 puffs into the lungs 2 (two) times daily. 06/03/23  Yes [provider]  traZODone (DESYREL) 50 MG tablet Take 50 mg by mouth at bedtime as needed. 08/19/22  Yes [provider]  Vitamin D, Ergocalciferol, (DRISDOL) 1.25 MG (50000 UNIT) CAPS capsule Take 50,000 Units by mouth once a week. 06/03/23  Yes [provider]  sertraline (ZOLOFT) 100 MG tablet Take 1 tablet (100 mg total) by mouth at bedtime. 08/08/22 12/27/22  Elsie Lincoln, MD    Family History Family History  Problem Relation Age of Onset   Diabetes Paternal Grandfather    Hypertension Paternal Grandmother    Diabetes Paternal Grandmother    Hypertension Father    Diabetes Father    Congestive Heart Failure Father    ADD / ADHD Son    Diabetes Paternal Aunt    Diabetes Paternal Uncle     Social History Social History   Tobacco Use   Smoking status: Former    Current packs/day: 0.00    Types: Cigarettes    Quit date: 05/21/2007    Years since quitting: 16.1   Smokeless tobacco: Never  Vaping Use   Vaping status: Former  Substance Use Topics   Alcohol use: Not Currently   Drug use: Not Currently    Types: Marijuana     Allergies   Shellfish allergy   Review of Systems Review of Systems  Genitourinary:  Positive for vaginal bleeding.   Per HPI  Physical Exam Triage Vital Signs ED Triage Vitals  Encounter Vitals Group     BP 06/26/23 1616 (!) 143/98     Systolic BP Percentile --      Diastolic BP Percentile --      Pulse Rate 06/26/23 1616 99     Resp 06/26/23 1616 18     Temp 06/26/23 1616 98 F (36.7 C)     Temp Source 06/26/23 1616 Oral     SpO2 06/26/23 1616 97 %     Weight --      Height --      Head Circumference --      Peak Flow --      Pain Score 06/26/23 1617 0     Pain Loc --      Pain Education --      Exclude from Growth Chart --    No data found.  Updated Vital Signs BP (!) 143/98 (BP Location: Left Wrist)   Pulse 99   Temp 98 F (36.7 C) (Oral)   Resp 18   LMP 05/23/2023 (Exact Date)   SpO2 97%     Physical Exam Vitals and nursing note reviewed.  Constitutional:      Appearance: Normal appearance.  HENT:  Mouth/Throat:     Mouth: Mucous membranes are moist.     Pharynx: Oropharynx is clear.  Eyes:     Conjunctiva/sclera: Conjunctivae normal.  Cardiovascular:     Rate and Rhythm: Normal rate and regular rhythm.     Pulses: Normal pulses.     Heart sounds: Normal heart sounds.  Pulmonary:     Effort: Pulmonary effort is normal. No respiratory distress.     Breath sounds: Normal breath sounds.  Abdominal:     Palpations: Abdomen is soft.     Tenderness: There is no abdominal tenderness. There is no right CVA tenderness, left CVA tenderness, guarding or rebound.  Musculoskeletal:        General: Normal range of motion.  Skin:    General: Skin is warm and dry.     Coloration: Skin is not pale.  Neurological:     Mental Status: She is alert and oriented to person, place, and time.     UC Treatments / Results  Labs (all labs ordered are listed, but only abnormal results are displayed) Labs Reviewed  POCT URINE PREGNANCY    EKG   Radiology No results found.  Procedures Procedures (including critical care time)  Medications Ordered in UC Medications - No data to display  Initial Impression / Assessment and Plan / UC Course  I have reviewed the triage vital signs and the nursing notes.  Pertinent labs & imaging results that were available during my care of the patient were reviewed by me and considered in my medical decision making (see chart for details).  Well appearing, stable vitals UPT negative Megase BID x 2 weeks. Sent 30 day supply incase bleeding begins again. Ob/gyn appointment on 1/22. She is on the wait list for sooner appointment if available. No red flags today. Return and ED precautions. No questions at this time  Final Clinical Impressions(s) / UC Diagnoses   Final diagnoses:  Abnormal uterine bleeding     Discharge Instructions       I have sent you a megase prescription Hopefully your ov/gyn will be able to see you sooner than 1/22!     ED Prescriptions     Medication Sig Dispense Auth. Provider   megestrol (MEGACE) 40 MG tablet Take 1 tablet (40 mg total) by mouth 2 (two) times daily. 60 tablet Leidi Astle, Lurena Joiner, PA-C      PDMP not reviewed this encounter.   Marlow Baars, [place] 06/26/23 1701

## 2023-06-26 NOTE — Discharge Instructions (Signed)
I have sent you a megase prescription Hopefully your ov/gyn will be able to see you sooner than 1/22!

## 2023-06-26 NOTE — Patient Instructions (Signed)
  [name], thank you for joining Gilberto Better, PA-C for today's virtual visit.  While this provider is not your primary care provider (PCP), if your PCP is located in our provider database this encounter information will be shared with them immediately following your visit.   A [facility] MyChart account gives you access to today's visit and all your visits, tests, and labs performed at [facility] " click here if you don't have a [facility] MyChart account or go to [email or web address]  Consent: (Patient) [name] [name] provided verbal consent for this virtual visit at the beginning of the encounter.  Current Medications:  Current Outpatient Medications:    albuterol (VENTOLIN HFA) 108 (90 Base) MCG/ACT inhaler, Inhale 2 puffs into the lungs every 6 (six) hours as needed for wheezing or shortness of breath., Disp: 8 g, Rfl: 0   ARIPiprazole (ABILIFY) 15 MG tablet, Take 15 mg by mouth at bedtime., Disp: , Rfl:    Blood Pressure Monitor MISC, For regular home bp monitoring during pregnancy Needs large cuff, Disp: 1 each, Rfl: 0   buPROPion (WELLBUTRIN XL) 300 MG 24 hr tablet, Take 300 mg by mouth daily., Disp: , Rfl:    cetirizine (ZYRTEC) 10 MG tablet, Take 1 tablet (10 mg total) by mouth daily., Disp: 90 tablet, Rfl: 0   fluticasone (FLONASE) 50 MCG/ACT nasal spray, Place 2 sprays into both nostrils daily., Disp: 16 g, Rfl: 0   megestrol (MEGACE) 40 MG tablet, Take 2 daily for 4 days (Patient not taking: Reported on 12/27/2022), Disp: 8 tablet, Rfl: 0   megestrol (MEGACE) 40 MG tablet, Take 1 tablet (40 mg total) by mouth 2 (two) times daily. Can increase to two tablets twice a day in the event of heavy bleeding, Disp: 60 tablet, Rfl: 2   montelukast (SINGULAIR) 10 MG tablet, Take 1 tablet (10 mg total) by mouth at bedtime., Disp: 30 tablet, Rfl: 2   NIFEdipine (ADALAT CC) 90 MG 24 hr tablet, Take 90 mg by mouth daily., Disp: , Rfl:    sertraline (ZOLOFT) 100 MG tablet,  Take 1 tablet (100 mg total) by mouth at bedtime., Disp: 30 tablet, Rfl: 0   traZODone (DESYREL) 50 MG tablet, Take 50 mg by mouth at bedtime as needed., Disp: , Rfl:    Medications ordered in this encounter:  No orders of the defined types were placed in this encounter.    *If you need refills on other medications prior to your next appointment, please contact your pharmacy*  Follow-Up: Call back or seek an in-person evaluation if the symptoms worsen or if the condition fails to improve as anticipated.  [facility] [phone]  Other Instructions Recommend to seek UC clinic for further evaluation.   If you have been instructed to have an in-person evaluation today at a local Urgent Care facility, please use the link below. It will take you to a list of all of our available [facility], including address, phone number and hours of operation. Please do not delay care.  [facility]  If you or a family member do not have a primary care provider, use the link below to schedule a visit and establish care. When you choose a [facility] primary care physician or advanced practice provider, you gain a long-term partner in health. Find a Primary Care Provider  Learn more about [facility]'s in-office and virtual care options:  - Get Care Now

## 2023-06-26 NOTE — Progress Notes (Signed)
Virtual Visit Consent   [name] [name], you are scheduled for a virtual visit with a [facility] provider today. Just as with appointments in the office, your consent must be obtained to participate. Your consent will be active for this visit and any virtual visit you may have with one of our providers in the next 365 days. If you have a MyChart account, a copy of this consent can be sent to you electronically.  As this is a virtual visit, video technology does not allow for your provider to perform a traditional examination. This may limit your provider's ability to fully assess your condition. If your provider identifies any concerns that need to be evaluated in person or the need to arrange testing (such as labs, EKG, etc.), we will make arrangements to do so. Although advances in technology are sophisticated, we cannot ensure that it will always work on either your end or our end. If the connection with a video visit is poor, the visit may have to be switched to a telephone visit. With either a video or telephone visit, we are not always able to ensure that we have a secure connection.  By engaging in this virtual visit, you consent to the provision of healthcare and authorize for your insurance to be billed (if applicable) for the services provided during this visit. Depending on your insurance coverage, you may receive a charge related to this service.  I need to obtain your verbal consent now. Are you willing to proceed with your visit today? [name] [name] has provided verbal consent on 06/26/2023 for a virtual visit (video or telephone). Gilberto Better, [place]  Date: 06/26/2023 11:36 AM  Virtual Visit via Video Note   I, Gilberto Better, connected with  [name] [name]  ([id number], [birth year]) on 06/26/23 at 11:30 AM EST by a video-enabled telemedicine application and verified that I am speaking with the correct person using two identifiers.  Location: Patient: Virtual Visit Location Patient:  Home Provider: Virtual Visit Location Provider: Home Office   I discussed the limitations of evaluation and management by telemedicine and the availability of in person appointments. The patient expressed understanding and agreed to proceed.    History of Present Illness: [name] [name] is a 37 y.o. who identifies as a female who was assigned female at birth, and is being seen today for vaginal bleeding.   HPI: 37 y/o F via telehealth video visit presents for c/o vaginal bleeding x 2.5 weeks. On oral contraception. This new oral contraception started in 08/2022 initially with similar symptoms. OB/GYN appointment unavailable for 2-3 months.     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   Severe episode of recurrent major depressive disorder, without psychotic features (HCC) 04/19/2022   PTSD (post-traumatic stress disorder) 04/19/2022   Asthma 01/08/2022   Bipolar disorder (HCC) 11/14/2021   Generalized anxiety disorder 11/14/2021   Previous cesarean section 11/14/2021   History of pyelonephritis 11/08/2021   Morbid obesity (HCC)    Snoring 08/03/2013    Allergies:  Allergies  Allergen Reactions  Shellfish Allergy Anaphylaxis    Throat swelling   Medications:  Current Outpatient Medications:    albuterol (VENTOLIN HFA) 108 (90 Base) MCG/ACT inhaler, Inhale 2 puffs into the lungs every 6 (six) hours as needed for wheezing or shortness of breath., Disp: 8 g, Rfl: 0   ARIPiprazole (ABILIFY) 15 MG tablet, Take 15 mg by  mouth at bedtime., Disp: , Rfl:    Blood Pressure Monitor MISC, For regular home bp monitoring during pregnancy Needs large cuff, Disp: 1 each, Rfl: 0   buPROPion (WELLBUTRIN XL) 300 MG 24 hr tablet, Take 300 mg by mouth daily., Disp: , Rfl:    cetirizine (ZYRTEC) 10 MG tablet, Take 1 tablet (10 mg total) by mouth daily., Disp: 90 tablet, Rfl: 0   fluticasone (FLONASE) 50 MCG/ACT nasal spray, Place 2 sprays into both nostrils daily., Disp: 16 g, Rfl: 0   megestrol (MEGACE) 40 MG tablet, Take 2 daily for 4 days (Patient not taking: Reported on 12/27/2022), Disp: 8 tablet, Rfl: 0   megestrol (MEGACE) 40 MG tablet, Take 1 tablet (40 mg total) by mouth 2 (two) times daily. Can increase to two tablets twice a day in the event of heavy bleeding, Disp: 60 tablet, Rfl: 2   montelukast (SINGULAIR) 10 MG tablet, Take 1 tablet (10 mg total) by mouth at bedtime., Disp: 30 tablet, Rfl: 2   NIFEdipine (ADALAT CC) 90 MG 24 hr tablet, Take 90 mg by mouth daily., Disp: , Rfl:    sertraline (ZOLOFT) 100 MG tablet, Take 1 tablet (100 mg total) by mouth at bedtime., Disp: 30 tablet, Rfl: 0   traZODone (DESYREL) 50 MG tablet, Take 50 mg by mouth at bedtime as needed., Disp: , Rfl:   Observations/Objective: Patient is well-developed, well-nourished in no acute distress.  Resting comfortably  at home.  Head is normocephalic, atraumatic.  No labored breathing.  Speech is clear and coherent with logical content.  Patient is alert and oriented at baseline.    Assessment and Plan: 1. Vaginal bleeding  Recommend to seek UC clinic for further evaluation for abnormal vaginal bleeding.  Pt verbalized understanding and in agreement.  Follow Up Instructions: I discussed the assessment and treatment plan with the patient. The patient was provided an opportunity to ask questions and all were answered. The patient agreed with the plan and demonstrated an understanding of the instructions.  A copy of instructions were sent to  the patient via MyChart unless otherwise noted below.   Patient has requested to receive PHI (AVS, Work Notes, etc) pertaining to this video visit through e-mail as they are currently without active MyChart. They have voiced understand that email is not considered secure and their health information could be viewed by someone other than the patient.   The patient was advised to call back or seek an in-person evaluation if the symptoms worsen or if the condition fails to improve as anticipated.    Gilberto Better, PA-C

## 2023-08-13 ENCOUNTER — Encounter: Payer: Medicaid Other | Admitting: Nurse Practitioner

## 2023-09-26 ENCOUNTER — Other Ambulatory Visit: Payer: Self-pay | Admitting: Obstetrics & Gynecology

## 2023-10-07 ENCOUNTER — Encounter: Payer: Self-pay | Admitting: Neurology

## 2023-10-23 ENCOUNTER — Encounter (HOSPITAL_COMMUNITY): Payer: Self-pay | Admitting: Emergency Medicine

## 2023-10-23 ENCOUNTER — Other Ambulatory Visit: Payer: Self-pay

## 2023-10-23 ENCOUNTER — Ambulatory Visit (HOSPITAL_COMMUNITY)
Admission: EM | Admit: 2023-10-23 | Discharge: 2023-10-23 | Disposition: A | Discharge status: Home / Self Care | Attending: Emergency Medicine | Admitting: Emergency Medicine

## 2023-10-23 DIAGNOSIS — N939 Abnormal uterine and vaginal bleeding, unspecified: Secondary | ICD-10-CM | POA: Diagnosis not present

## 2023-10-23 LAB — POCT URINE PREGNANCY: Preg Test, Ur: NEGATIVE

## 2023-10-23 MED ORDER — MEGESTROL ACETATE 40 MG PO TABS: 40.0000 mg | ORAL_TABLET | Freq: Two times a day (BID) | ORAL | 0 refills | Status: AC

## 2023-10-23 NOTE — Discharge Instructions (Addendum)
 Start taking Megace twice daily for for 5 days for vaginal bleeding.  Discontinue your birth control while taking this.  Call the [facility] at [phone] to schedule an appointment with them regarding recurrent abnormal vaginal bleeding.  Return here as needed. If you develop dizziness, passing out, shortness of breath, or excessive bleeding to the point of changing your pad once every hour please seek immediate medical treatment in the emergency department.

## 2023-10-23 NOTE — ED Provider Notes (Signed)
 [facility]    CSN: [id number] Arrival date & time: 10/23/23  1124      History   Chief Complaint Chief Complaint  Patient presents with   Vaginal Bleeding    HPI [name] [name] is a 38 y.o. female.   Patient presents with abnormal vaginal bleeding.  Patient states that she has history of abnormal vaginal bleeding and usually receives Megace.  Patient states that she normally will go a month or 2 months without having a.  And then will have excessive bleeding for a month or so at a time.  Patient states that this episode of vaginal bleeding began on 3/3.  Patient states that vaginal bleeding is minimal at times and then other times is heavier with large clots.  Denies abdominal pain, vaginal pain, abnormal vaginal discharge, dysuria, hematuria, urinary frequency/urgency, and flank pain.  Patient states that she takes Sprintec for birth control.  Patient states that she stopped taking it 2 days ago because she knew that she would be coming in soon and receiving Megace which she knows that she is not supposed to take at the same time.  Patient states that she currently does not have an OB/GYN and is requesting resources for an OB/GYN that is excepting Medicaid.   Vaginal Bleeding   Past Medical History:  Diagnosis Date   Abnormal Pap smear    Alpha thalassemia silent carrier 12/04/2021   FOB___   Anxiety    Anxiety and depression 02/01/2022   Asthma    Bipolar 1 disorder (HCC)    Borderline personality disorder (HCC)    Depression    GERD (gastroesophageal reflux disease)    IUD (intrauterine device) in place 04/12/2013   Had IUD inserted 03/29/13 could not feel strings went to ER 9/18 and KUB saw IUD still could not see string, can't see now but seen in US   Kidney infection    Morbid obesity (HCC)    Pregnancy    Pregnancy induced hypertension    PTSD (post-traumatic stress disorder)    UTI (urinary tract infection)     Patient Active Problem List    Diagnosis Date Noted   Abnormal uterine bleeding (AUB) 11/19/2022   DOE (dyspnea on exertion) 10/31/2022   Visit for routine gyn exam 08/29/2022   Breakthrough bleeding on birth control pills 08/29/2022   Encounter for surveillance of contraceptive pills 07/02/2022   Long term current use of antipsychotic medication 06/24/2022   Cannabis use disorder 06/24/2022   GERD (gastroesophageal reflux disease) 06/12/2022   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   Severe episode of recurrent major depressive disorder, without psychotic features (HCC) 04/19/2022   PTSD (post-traumatic stress disorder) 04/19/2022   Asthma 01/08/2022   Bipolar disorder (HCC) 11/14/2021   Generalized anxiety disorder 11/14/2021   Previous cesarean section 11/14/2021   History of pyelonephritis 11/08/2021   Morbid obesity (HCC)    Snoring 08/03/2013    Past Surgical History:  Procedure Laterality Date   CESAREAN SECTION     HYSTEROSCOPY N/A 01/09/2021   Procedure: HYSTEROSCOPY;  Surgeon: Myna Hidalgo, DO;  Location: [facility];  Service: Gynecology;  Laterality: N/A;   IUD REMOVAL N/A 01/09/2021   Procedure: INTRAUTERINE DEVICE (IUD) REMOVAL;  Surgeon: Myna Hidalgo, DO;  Location: [facility];  Service: Gynecology;  Laterality: N/A;   TOOTH EXTRACTION      OB History     Gravida  5   Para  3   Term  2  Preterm  1   AB  2   Living  3      SAB  2   IAB  0   Ectopic  0   Multiple  0   Live Births  3            Home Medications    Prior to Admission medications   Medication Sig Start Date End Date Taking? Authorizing Provider  albuterol (VENTOLIN HFA) 108 (90 Base) MCG/ACT inhaler Inhale 2 puffs into the lungs every 6 (six) hours as needed for wheezing or shortness of breath. 01/08/22   Gilmore Laroche, FNP  ARIPiprazole (ABILIFY) 15 MG tablet Take 15 mg by mouth at bedtime. 08/19/22   [provider]  buPROPion (WELLBUTRIN XL) 300 MG 24 hr tablet Take 300 mg by mouth  daily.    [provider]  cetirizine (ZYRTEC) 10 MG tablet Take 1 tablet (10 mg total) by mouth daily. 01/08/22   Gilmore Laroche, FNP  fluticasone (FLONASE) 50 MCG/ACT nasal spray Place 2 sprays into both nostrils daily. 09/23/22   Freddy Finner, NP  megestrol (MEGACE) 40 MG tablet Take 1 tablet (40 mg total) by mouth 2 (two) times daily for 5 days. 10/23/23 10/28/23  Wynonia Lawman A, NP  pantoprazole (PROTONIX) 20 MG tablet Take 20 mg by mouth daily. 02/25/23   [provider]  sertraline (ZOLOFT) 100 MG tablet Take 1 tablet (100 mg total) by mouth at bedtime. 08/08/22 12/27/22  Elsie Lincoln, MD  SPRINTEC 28 0.25-35 MG-MCG tablet TAKE ONE TABLET BY MOUTH ONCE DAILY 09/29/23   Lazaro Arms, MD  SYMBICORT 80-4.5 MCG/ACT inhaler Inhale 2 puffs into the lungs 2 (two) times daily. 06/03/23   [provider]  traZODone (DESYREL) 50 MG tablet Take 50 mg by mouth at bedtime as needed. 08/19/22   [provider]  Vitamin D, Ergocalciferol, (DRISDOL) 1.25 MG (50000 UNIT) CAPS capsule Take 50,000 Units by mouth once a week. 06/03/23   [provider]    Family History Family History  Problem Relation Age of Onset   Diabetes Paternal Grandfather    Hypertension Paternal Grandmother    Diabetes Paternal Grandmother    Hypertension Father    Diabetes Father    Congestive Heart Failure Father    ADD / ADHD Son    Diabetes Paternal Aunt    Diabetes Paternal Uncle     Social History Social History   Tobacco Use   Smoking status: Former    Current packs/day: 0.00    Types: Cigarettes    Quit date: 05/21/2007    Years since quitting: 16.4   Smokeless tobacco: Never  Vaping Use   Vaping status: Former  Substance Use Topics   Alcohol use: Not Currently   Drug use: Not Currently    Types: Marijuana     Allergies   Shellfish allergy   Review of Systems Review of Systems  Genitourinary:  Positive for vaginal bleeding.   Per HPI  Physical  Exam Triage Vital Signs ED Triage Vitals  Encounter Vitals Group     BP 10/23/23 1245 (!) 156/101     Systolic BP Percentile --      Diastolic BP Percentile --      Pulse Rate 10/23/23 1245 94     Resp 10/23/23 1245 (!) 22     Temp 10/23/23 1245 97.9 F (36.6 C)     Temp Source 10/23/23 1245 Oral     SpO2 10/23/23 1245  96 %     Weight --      Height --      Head Circumference --      Peak Flow --      Pain Score 10/23/23 1242 4     Pain Loc --      Pain Education --      Exclude from Growth Chart --    No data found.  Updated Vital Signs BP (!) 156/101 (BP Location: Right Arm) Comment: did not take blood pressure medicine  Pulse 94   Temp 97.9 F (36.6 C) (Oral)   Resp (!) 22   LMP 09/22/2023   SpO2 96%   Visual Acuity Right Eye Distance:   Left Eye Distance:   Bilateral Distance:    Right Eye Near:   Left Eye Near:    Bilateral Near:     Physical Exam Vitals and nursing note reviewed.  Constitutional:      General: She is not in acute distress.    Appearance: Normal appearance. She is not ill-appearing.  Abdominal:     General: Bowel sounds are normal. There is no distension.     Palpations: Abdomen is soft. There is no mass.     Tenderness: There is no abdominal tenderness. There is no right CVA tenderness, left CVA tenderness, guarding or rebound.     Hernia: No hernia is present.  Skin:    General: Skin is warm and dry.  Neurological:     Mental Status: She is alert.      UC Treatments / Results  Labs (all labs ordered are listed, but only abnormal results are displayed) Labs Reviewed  POCT URINE PREGNANCY    EKG   Radiology No results found.  Procedures Procedures (including critical care time)  Medications Ordered in UC Medications - No data to display  Initial Impression / Assessment and Plan / UC Course  I have reviewed the triage vital signs and the nursing notes.  Pertinent labs & imaging results that were available during my  care of the patient were reviewed by me and considered in my medical decision making (see chart for details).     Patient is well-appearing and vital signs are stable.  Urine pregnancy is negative.  Prescribed Megace twice daily for 5 days.  Given information for the [facility] to follow-up with regarding abnormal vaginal bleeding.  Discussed return and strict ER precautions. Final Clinical Impressions(s) / UC Diagnoses   Final diagnoses:  Abnormal uterine bleeding (AUB)     Discharge Instructions      Start taking Megace twice daily for for 5 days for vaginal bleeding.  Discontinue your birth control while taking this.  Call the [facility] at [phone] to schedule an appointment with them regarding recurrent abnormal vaginal bleeding.  Return here as needed. If you develop dizziness, passing out, shortness of breath, or excessive bleeding to the point of changing your pad once every hour please seek immediate medical treatment in the emergency department.   ED Prescriptions     Medication Sig Dispense Auth. Provider   megestrol (MEGACE) 40 MG tablet Take 1 tablet (40 mg total) by mouth 2 (two) times daily for 5 days. 10 tablet Wynonia Lawman A, NP      PDMP not reviewed this encounter.   Wynonia Lawman A, NP 10/23/23 1410

## 2023-10-23 NOTE — ED Triage Notes (Signed)
 Reports a history of no period for a month or 2 months, then will have extensive bleeding.  This episode of bleeding started 09/22/2023.  Does not have an ob/gyn in [place].  Requesting help with finding a specialist.  Patient reports to ucc for megace.

## 2023-11-24 ENCOUNTER — Telehealth

## 2023-11-24 DIAGNOSIS — A084 Viral intestinal infection, unspecified: Secondary | ICD-10-CM

## 2023-11-24 MED ORDER — ONDANSETRON 4 MG PO TBDP: 4.0000 mg | ORAL_TABLET | Freq: Three times a day (TID) | ORAL | 0 refills | Status: DC | PRN

## 2023-11-24 MED ORDER — LOPERAMIDE HCL 2 MG PO TABS: 2.0000 mg | ORAL_TABLET | Freq: Four times a day (QID) | ORAL | 0 refills | Status: DC | PRN

## 2023-11-24 NOTE — Progress Notes (Signed)
 Virtual Visit Consent   [name] [name], you are scheduled for a virtual visit with a [facility] provider today. Just as with appointments in the office, your consent must be obtained to participate. Your consent will be active for this visit and any virtual visit you may have with one of our providers in the next 365 days. If you have a MyChart account, a copy of this consent can be sent to you electronically.  As this is a virtual visit, video technology does not allow for your provider to perform a traditional examination. This may limit your provider's ability to fully assess your condition. If your provider identifies any concerns that need to be evaluated in person or the need to arrange testing (such as labs, EKG, etc.), we will make arrangements to do so. Although advances in technology are sophisticated, we cannot ensure that it will always work on either your end or our end. If the connection with a video visit is poor, the visit may have to be switched to a telephone visit. With either a video or telephone visit, we are not always able to ensure that we have a secure connection.  By engaging in this virtual visit, you consent to the provision of healthcare and authorize for your insurance to be billed (if applicable) for the services provided during this visit. Depending on your insurance coverage, you may receive a charge related to this service.  I need to obtain your verbal consent now. Are you willing to proceed with your visit today? [name] [name] has provided verbal consent on 11/24/2023 for a virtual visit (video or telephone). Angelia Kelp, PA-C  Date: 11/24/2023 1:40 PM   Virtual Visit via Video Note   I, Angelia Kelp, connected with  [name] [name]  ([id number], [birth year]) on 11/24/23 at  1:30 PM EDT by a video-enabled telemedicine application and verified that I am speaking with the correct person using two identifiers.  Location: Patient: Virtual Visit  Location Patient: Home Provider: Virtual Visit Location Provider: Home Office   I discussed the limitations of evaluation and management by telemedicine and the availability of in person appointments. The patient expressed understanding and agreed to proceed.    History of Present Illness: [name] [name] is a 38 y.o. who identifies as a female who was assigned female at birth, and is being seen today for nausea, vomiting, and diarrhea.  HPI: Emesis  This is a new problem. The current episode started yesterday. The problem occurs 5 to 10 times per day. The problem has been rapidly improving. The emesis has an appearance of stomach contents. There has been no fever. Associated symptoms include abdominal pain and diarrhea. Pertinent negatives include no arthralgias, chills, coughing, dizziness, fever, headaches, myalgias or sweats. She has tried nothing for the symptoms. The treatment provided no relief.     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   Severe episode of recurrent major depressive disorder, without psychotic features (HCC) 04/19/2022   PTSD (post-traumatic stress disorder) 04/19/2022   Asthma 01/08/2022  Bipolar disorder (HCC) 11/14/2021   Generalized anxiety disorder 11/14/2021   Previous cesarean section 11/14/2021   History of pyelonephritis 11/08/2021   Morbid obesity (HCC)    Snoring 08/03/2013    Allergies:  Allergies  Allergen Reactions   Shellfish Allergy Anaphylaxis    Throat swelling    Medications:  Current Outpatient Medications:    loperamide (IMODIUM A-D) 2 MG tablet, Take 1 tablet (2 mg total) by mouth 4 (four) times daily as needed for diarrhea or loose stools., Disp: 30 tablet, Rfl: 0   ondansetron  (ZOFRAN -ODT) 4 MG disintegrating tablet, Take 1 tablet (4 mg total) by mouth every 8 (eight) hours as needed., Disp: 20 tablet, Rfl: 0   albuterol  (VENTOLIN  HFA) 108 (90 Base) MCG/ACT inhaler, Inhale 2 puffs into the lungs every 6 (six) hours as needed for wheezing or shortness of breath., Disp: 8 g, Rfl: 0   ARIPiprazole (ABILIFY) 15 MG tablet, Take 15 mg by mouth at bedtime., Disp: , Rfl:    buPROPion (WELLBUTRIN XL) 300 MG 24 hr tablet, Take 300 mg by mouth daily., Disp: , Rfl:    cetirizine  (ZYRTEC ) 10 MG tablet, Take 1 tablet (10 mg total) by mouth daily., Disp: 90 tablet, Rfl: 0   fluticasone  (FLONASE ) 50 MCG/ACT nasal spray, Place 2 sprays into both nostrils daily., Disp: 16 g, Rfl: 0   pantoprazole  (PROTONIX ) 20 MG tablet, Take 20 mg by mouth daily., Disp: , Rfl:    sertraline  (ZOLOFT ) 100 MG tablet, Take 1 tablet (100 mg total) by mouth at bedtime., Disp: 30 tablet, Rfl: 0   SPRINTEC 28 0.25-35 MG-MCG tablet, TAKE ONE TABLET BY MOUTH ONCE DAILY, Disp: 28 tablet, Rfl: 12   SYMBICORT 80-4.5 MCG/ACT inhaler, Inhale 2 puffs into the lungs 2 (two) times daily., Disp: , Rfl:    traZODone (DESYREL) 50 MG tablet, Take 50 mg by mouth at bedtime as needed., Disp: , Rfl:    Vitamin D , Ergocalciferol , (DRISDOL) 1.25 MG (50000 UNIT) CAPS capsule, Take 50,000 Units by mouth once a week., Disp: , Rfl:   Observations/Objective: Patient is well-developed, well-nourished in no acute distress.  Resting comfortably at home.  Head is normocephalic, atraumatic.  No labored breathing.  Speech is clear and coherent with logical content.  Patient is alert and oriented at baseline.    Assessment and Plan: 1. Viral gastroenteritis (Primary) - ondansetron  (ZOFRAN -ODT) 4 MG  disintegrating tablet; Take 1 tablet (4 mg total) by mouth every 8 (eight) hours as needed.  Dispense: 20 tablet; Refill: 0  - Suspect viral gastroenteritis - Zofran  for nausea - Imodium for diarrhea - Push fluids, electrolyte beverages - Liquid diet, then increase to soft/bland (BRAT) diet over next day, then increase diet as tolerated - Seek in person evaluation if not improving or symptoms worsen   Follow Up Instructions: I discussed the assessment and treatment plan with the patient. The patient was provided an opportunity to ask questions and all were answered. The patient agreed with the plan and demonstrated an understanding of the instructions.  A copy of instructions were sent to the patient via MyChart unless otherwise noted below.    The patient was advised to call back or seek an in-person evaluation if the symptoms worsen or if the condition fails to improve as anticipated.    Angelia Kelp, PA-C

## 2023-11-24 NOTE — Patient Instructions (Signed)
 Duwaine Gins, thank you for joining Angelia Kelp, PA-C for today's virtual visit.  While this provider is not your primary care provider (PCP), if your PCP is located in our provider database this encounter information will be shared with them immediately following your visit.   A [facility] MyChart account gives you access to today's visit and all your visits, tests, and labs performed at [facility] " click here if you don't have a [facility] MyChart account or go to [email or web address]  Consent: (Patient) [name] [name] provided verbal consent for this virtual visit at the beginning of the encounter.  Current Medications:  Current Outpatient Medications:    loperamide (IMODIUM A-D) 2 MG tablet, Take 1 tablet (2 mg total) by mouth 4 (four) times daily as needed for diarrhea or loose stools., Disp: 30 tablet, Rfl: 0   ondansetron  (ZOFRAN -ODT) 4 MG disintegrating tablet, Take 1 tablet (4 mg total) by mouth every 8 (eight) hours as needed., Disp: 20 tablet, Rfl: 0   albuterol  (VENTOLIN  HFA) 108 (90 Base) MCG/ACT inhaler, Inhale 2 puffs into the lungs every 6 (six) hours as needed for wheezing or shortness of breath., Disp: 8 g, Rfl: 0   ARIPiprazole (ABILIFY) 15 MG tablet, Take 15 mg by mouth at bedtime., Disp: , Rfl:    buPROPion (WELLBUTRIN XL) 300 MG 24 hr tablet, Take 300 mg by mouth daily., Disp: , Rfl:    cetirizine  (ZYRTEC ) 10 MG tablet, Take 1 tablet (10 mg total) by mouth daily., Disp: 90 tablet, Rfl: 0   fluticasone  (FLONASE ) 50 MCG/ACT nasal spray, Place 2 sprays into both nostrils daily., Disp: 16 g, Rfl: 0   pantoprazole  (PROTONIX ) 20 MG tablet, Take 20 mg by mouth daily., Disp: , Rfl:    sertraline  (ZOLOFT ) 100 MG tablet, Take 1 tablet (100 mg total) by mouth at bedtime., Disp: 30 tablet, Rfl: 0   SPRINTEC 28 0.25-35 MG-MCG tablet, TAKE ONE TABLET BY MOUTH ONCE DAILY, Disp: 28 tablet, Rfl: 12   SYMBICORT 80-4.5 MCG/ACT inhaler, Inhale 2 puffs into the  lungs 2 (two) times daily., Disp: , Rfl:    traZODone (DESYREL) 50 MG tablet, Take 50 mg by mouth at bedtime as needed., Disp: , Rfl:    Vitamin D , Ergocalciferol , (DRISDOL) 1.25 MG (50000 UNIT) CAPS capsule, Take 50,000 Units by mouth once a week., Disp: , Rfl:    Medications ordered in this encounter:  Meds ordered this encounter  Medications   ondansetron  (ZOFRAN -ODT) 4 MG disintegrating tablet    Sig: Take 1 tablet (4 mg total) by mouth every 8 (eight) hours as needed.    Dispense:  20 tablet    Refill:  0    Supervising Provider:   LAMPTEY, PHILIP O [[id number]]   loperamide (IMODIUM A-D) 2 MG tablet    Sig: Take 1 tablet (2 mg total) by mouth 4 (four) times daily as needed for diarrhea or loose stools.    Dispense:  30 tablet    Refill:  0    Supervising Provider:   LAMPTEY, PHILIP O [[id number]]     *If you need refills on other medications prior to your next appointment, please contact your pharmacy*  Follow-Up: Call back or seek an in-person evaluation if the symptoms worsen or if the condition fails to improve as anticipated.  [facility] [phone]  Other Instructions Viral Gastroenteritis, Adult  Viral gastroenteritis is also known as the stomach flu. This condition may affect your stomach, small intestine,  and large intestine. It can cause sudden watery diarrhea, fever, and vomiting. This condition is caused by many different viruses. These viruses can be passed from person to person very easily (are contagious). Diarrhea and vomiting can make you feel weak and cause you to become dehydrated. You may not be able to keep fluids down. Dehydration can make you tired and thirsty, cause you to have a dry mouth, and decrease how often you urinate. It is important to replace the fluids that you lose from diarrhea and vomiting. What are the causes? Gastroenteritis is caused by many viruses, including rotavirus and norovirus. Norovirus is the most common cause in  adults. You can get sick after being exposed to the viruses from other people. You can also get sick by: Eating food, drinking water, or touching a surface contaminated with one of these viruses. Sharing utensils or other personal items with an infected person. What increases the risk? You are more likely to develop this condition if you: Have a weak body defense system (immune system). Live with one or more children who are younger than 2 years. Live in a nursing home. Travel on cruise ships. What are the signs or symptoms? Symptoms of this condition start suddenly 1-3 days after exposure to a virus. Symptoms may last for a few days or for as long as a week. Common symptoms include watery diarrhea and vomiting. Other symptoms include: Fever. Headache. Fatigue. Pain in the abdomen. Chills. Weakness. Nausea. Muscle aches. Loss of appetite. How is this diagnosed? This condition is diagnosed with a medical history and physical exam. You may also have a stool test to check for viruses or other infections. How is this treated? This condition typically goes away on its own. The focus of treatment is to prevent dehydration and restore lost fluids (rehydration). This condition may be treated with: An oral rehydration solution (ORS) to replace important salts and minerals (electrolytes) in your body. Take this if told by your health care provider. This is a drink that is sold at pharmacies and retail stores. Medicines to help with your symptoms. Probiotic supplements to reduce symptoms of diarrhea. Fluids given through an IV, if dehydration is severe. Older adults and people with other diseases or a weak immune system are at higher risk for dehydration. Follow these instructions at home: Eating and drinking  Take an ORS as told by your health care provider. Drink clear fluids in small amounts as you are able. Clear fluids include: Water. Ice chips. Diluted fruit juice. Low-calorie sports  drinks. Drink enough fluid to keep your urine pale yellow. Eat small amounts of healthy foods every 3-4 hours as you are able. This may include whole grains, fruits, vegetables, lean meats, and yogurt. Avoid fluids that contain a lot of sugar or caffeine , such as energy drinks, sports drinks, and soda. Avoid spicy or fatty foods. Avoid alcohol. General instructions  Wash your hands often, especially after having diarrhea or vomiting. If soap and water are not available, use hand sanitizer. Make sure that all people in your household wash their hands well and often. Take over-the-counter and prescription medicines only as told by your health care provider. Rest at home while you recover. Watch your condition for any changes. Take a warm bath to relieve any burning or pain from frequent diarrhea episodes. Keep all follow-up visits. This is important. Contact a health care provider if you: Cannot keep fluids down. Have symptoms that get worse. Have new symptoms. Feel light-headed or dizzy. Have  muscle cramps. Get help right away if you: Have chest pain. Have trouble breathing or you are breathing very quickly. Have a fast heartbeat. Feel extremely weak or you faint. Have a severe headache, a stiff neck, or both. Have a rash. Have severe pain, cramping, or bloating in your abdomen. Have skin that feels cold and clammy. Feel confused. Have pain when you urinate. Have signs of dehydration, such as: Dark urine, very little urine, or no urine. Cracked lips. Dry mouth. Sunken eyes. Sleepiness. Weakness. Have signs of bleeding, such as: Seeing blood in your vomit. Having vomit that looks like coffee grounds. Having bloody or black stools or stools that look like tar. These symptoms may be an emergency. Get help right away. Call 911. Do not wait to see if the symptoms will go away. Do not drive yourself to the hospital. Summary Viral gastroenteritis is also known as the stomach  flu. It can cause sudden watery diarrhea, fever, and vomiting. This condition can be passed from person to person very easily (is contagious). Take an oral rehydration solution (ORS) if told by your health care provider. This is a drink that is sold at pharmacies and retail stores. Wash your hands often, especially after having diarrhea or vomiting. If soap and water are not available, use hand sanitizer. This information is not intended to replace advice given to you by your health care provider. Make sure you discuss any questions you have with your health care provider. Document Revised: 05/07/2021 Document Reviewed: 05/07/2021 Elsevier Patient Education  2024 Elsevier Inc.   If you have been instructed to have an in-person evaluation today at a local Urgent Care facility, please use the link below. It will take you to a list of all of our available [facility], including address, phone number and hours of operation. Please do not delay care.  [facility]  If you or a family member do not have a primary care provider, use the link below to schedule a visit and establish care. When you choose a [facility] primary care physician or advanced practice provider, you gain a long-term partner in health. Find a Primary Care Provider  Learn more about [facility]'s in-office and virtual care options:  - Get Care Now
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
## 2024-02-17 ENCOUNTER — Ambulatory Visit: Admitting: Neurology

## 2024-02-17 ENCOUNTER — Encounter: Payer: Self-pay | Admitting: Neurology

## 2024-04-24 ENCOUNTER — Telehealth: Admitting: Nurse Practitioner

## 2024-04-24 DIAGNOSIS — H5711 Ocular pain, right eye: Secondary | ICD-10-CM

## 2024-04-24 MED ORDER — IBUPROFEN 800 MG PO TABS: 800.0000 mg | ORAL_TABLET | Freq: Three times a day (TID) | ORAL | 0 refills | Status: AC | PRN

## 2024-04-24 NOTE — Patient Instructions (Signed)
 [name] [name], thank you for joining [name] LELON Servant, NP for today's virtual visit.  While this provider is not your primary care provider (PCP), if your PCP is located in our provider database this encounter information will be shared with them immediately following your visit.   A [facility] MyChart account gives you access to today's visit and all your visits, tests, and labs performed at [facility]  click here if you don't have a [facility] MyChart account or go to [email or web address]  Consent: (Patient) [name] [name] provided verbal consent for this virtual visit at the beginning of the encounter.  Current Medications:  Current Outpatient Medications:    ibuprofen  (ADVIL ) 800 MG tablet, Take 1 tablet (800 mg total) by mouth every 8 (eight) hours as needed., Disp: 30 tablet, Rfl: 0   albuterol  (VENTOLIN  HFA) 108 (90 Base) MCG/ACT inhaler, Inhale 2 puffs into the lungs every 6 (six) hours as needed for wheezing or shortness of breath., Disp: 8 g, Rfl: 0   ARIPiprazole (ABILIFY) 15 MG tablet, Take 15 mg by mouth at bedtime., Disp: , Rfl:    buPROPion (WELLBUTRIN XL) 300 MG 24 hr tablet, Take 300 mg by mouth daily., Disp: , Rfl:    cetirizine  (ZYRTEC ) 10 MG tablet, Take 1 tablet (10 mg total) by mouth daily., Disp: 90 tablet, Rfl: 0   fluticasone  (FLONASE ) 50 MCG/ACT nasal spray, Place 2 sprays into both nostrils daily., Disp: 16 g, Rfl: 0   loperamide  (IMODIUM  A-D) 2 MG tablet, Take 1 tablet (2 mg total) by mouth 4 (four) times daily as needed for diarrhea or loose stools., Disp: 30 tablet, Rfl: 0   ondansetron  (ZOFRAN -ODT) 4 MG disintegrating tablet, Take 1 tablet (4 mg total) by mouth every 8 (eight) hours as needed., Disp: 20 tablet, Rfl: 0   pantoprazole  (PROTONIX ) 20 MG tablet, Take 20 mg by mouth daily., Disp: , Rfl:    sertraline  (ZOLOFT ) 100 MG tablet, Take 1 tablet (100 mg total) by mouth at bedtime., Disp: 30 tablet, Rfl: 0   SPRINTEC 28 0.25-35 MG-MCG  tablet, TAKE ONE TABLET BY MOUTH ONCE DAILY, Disp: 28 tablet, Rfl: 12   SYMBICORT 80-4.5 MCG/ACT inhaler, Inhale 2 puffs into the lungs 2 (two) times daily., Disp: , Rfl:    traZODone (DESYREL) 50 MG tablet, Take 50 mg by mouth at bedtime as needed., Disp: , Rfl:    Vitamin D , Ergocalciferol , (DRISDOL) 1.25 MG (50000 UNIT) CAPS capsule, Take 50,000 Units by mouth once a week., Disp: , Rfl:    Medications ordered in this encounter:  Meds ordered this encounter  Medications   ibuprofen  (ADVIL ) 800 MG tablet    Sig: Take 1 tablet (800 mg total) by mouth every 8 (eight) hours as needed.    Dispense:  30 tablet    Refill:  0    Supervising Provider:   LAMPTEY, PHILIP O [[id number]]     *If you need refills on other medications prior to your next appointment, please contact your pharmacy*  Follow-Up: Call back or seek an in-person evaluation if the symptoms worsen or if the condition fails to improve as anticipated.   Virtual Care [phone]  Other Instructions May apply cold compresses to right eye for relief of pain   If you have been instructed to have an in-person evaluation today at a local Urgent Care facility, please use the link below. It will take you to a list of all of our available [facility]gent [facility],  including address, phone number and hours of operation. Please do not delay care.  [facility]  If you or a family member do not have a primary care provider, use the link below to schedule a visit and establish care. When you choose a [facility] primary care physician or advanced practice provider, you gain a long-term partner in health. Find a Primary Care Provider  Learn more about [facility]'s in-office and virtual care options: [facility] - Get Care Now
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
## 2024-04-30 ENCOUNTER — Telehealth: Admitting: Physician Assistant

## 2024-04-30 DIAGNOSIS — R051 Acute cough: Secondary | ICD-10-CM | POA: Diagnosis not present

## 2024-04-30 DIAGNOSIS — R6889 Other general symptoms and signs: Secondary | ICD-10-CM | POA: Diagnosis not present

## 2024-04-30 DIAGNOSIS — J4541 Moderate persistent asthma with (acute) exacerbation: Secondary | ICD-10-CM

## 2024-04-30 DIAGNOSIS — R112 Nausea with vomiting, unspecified: Secondary | ICD-10-CM | POA: Diagnosis not present

## 2024-04-30 DIAGNOSIS — Z20828 Contact with and (suspected) exposure to other viral communicable diseases: Secondary | ICD-10-CM

## 2024-04-30 MED ORDER — ONDANSETRON 4 MG PO TBDP
4.0000 mg | ORAL_TABLET | Freq: Three times a day (TID) | ORAL | 0 refills | Status: AC | PRN
Start: 2024-04-30 — End: ?

## 2024-04-30 MED ORDER — PREDNISONE 20 MG PO TABS: 40.0000 mg | ORAL_TABLET | Freq: Every day | ORAL | 0 refills | Status: DC

## 2024-04-30 MED ORDER — PSEUDOEPH-BROMPHEN-DM 30-2-10 MG/5ML PO SYRP: 5.0000 mL | ORAL_SOLUTION | Freq: Four times a day (QID) | ORAL | 0 refills | Status: AC | PRN

## 2024-04-30 NOTE — Progress Notes (Signed)
 Virtual Visit Consent   [name] [name], you are scheduled for a virtual visit with a [facility] provider today. Just as with appointments in the office, your consent must be obtained to participate. Your consent will be active for this visit and any virtual visit you may have with one of our providers in the next 365 days. If you have a MyChart account, a copy of this consent can be sent to you electronically.  As this is a virtual visit, video technology does not allow for your provider to perform a traditional examination. This may limit your provider's ability to fully assess your condition. If your provider identifies any concerns that need to be evaluated in person or the need to arrange testing (such as labs, EKG, etc.), we will make arrangements to do so. Although advances in technology are sophisticated, we cannot ensure that it will always work on either your end or our end. If the connection with a video visit is poor, the visit may have to be switched to a telephone visit. With either a video or telephone visit, we are not always able to ensure that we have a secure connection.  By engaging in this virtual visit, you consent to the provision of healthcare and authorize for your insurance to be billed (if applicable) for the services provided during this visit. Depending on your insurance coverage, you may receive a charge related to this service.  I need to obtain your verbal consent now. Are you willing to proceed with your visit today? [name] [name] has provided verbal consent on 04/30/2024 for a virtual visit (video or telephone). Delon CHRISTELLA Dickinson, PA-C  Date: 04/30/2024 10:44 AM   Virtual Visit via Video Note   I, Delon CHRISTELLA Dickinson, connected with  [name] [name]  ([id number], 23-Aug-1985) on 04/30/24 at 10:30 AM EDT by a video-enabled telemedicine application and verified that I am speaking with the correct person using two identifiers.  Location: Patient: Virtual Visit  Location Patient: Mobile Provider: Virtual Visit Location Provider: Home Office   I discussed the limitations of evaluation and management by telemedicine and the availability of in person appointments. The patient expressed understanding and agreed to proceed.    History of Present Illness: [name] [name] is a 38 y.o. who identifies as a female who was assigned female at birth, and is being seen today for flu-like symptoms.  HPI: URI  This is a new problem. The current episode started in the past 7 days (3-4 days, had exposures to flu A at work). The problem has been gradually worsening. Maximum temperature: subjective fever on day 1. The fever has been present for Less than 1 day. Associated symptoms include congestion, coughing, headaches, nausea, rhinorrhea, a sore throat (mild), vomiting and wheezing. Pertinent negatives include no ear pain, plugged ear sensation, sinus pain or sneezing. She has tried inhaler use for the symptoms. The treatment provided no relief.     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   Severe episode of recurrent major depressive disorder, without psychotic features (  HCC) 04/19/2022   PTSD (post-traumatic stress disorder) 04/19/2022   Asthma 01/08/2022   Bipolar disorder (HCC) 11/14/2021   Generalized anxiety disorder 11/14/2021   Previous cesarean section 11/14/2021   History of pyelonephritis 11/08/2021   Morbid obesity (HCC)    Snoring 08/03/2013    Allergies:  Allergies  Allergen  Reactions   Shellfish Allergy Anaphylaxis    Throat swelling   Medications:  Current Outpatient Medications:    brompheniramine-pseudoephedrine-DM 30-2-10 MG/5ML syrup, Take 5 mLs by mouth 4 (four) times daily as needed., Disp: 120 mL, Rfl: 0   ondansetron  (ZOFRAN -ODT) 4 MG disintegrating tablet, Take 1 tablet (4 mg total) by mouth every 8 (eight) hours as needed., Disp: 20 tablet, Rfl: 0   predniSONE  (DELTASONE ) 20 MG tablet, Take 2 tablets (40 mg total) by mouth daily with breakfast., Disp: 14 tablet, Rfl: 0   albuterol  (VENTOLIN  HFA) 108 (90 Base) MCG/ACT inhaler, Inhale 2 puffs into the lungs every 6 (six) hours as needed for wheezing or shortness of breath., Disp: 8 g, Rfl: 0   ARIPiprazole (ABILIFY) 15 MG tablet, Take 15 mg by mouth at bedtime., Disp: , Rfl:    buPROPion (WELLBUTRIN XL) 300 MG 24 hr tablet, Take 300 mg by mouth daily., Disp: , Rfl:    cetirizine  (ZYRTEC ) 10 MG tablet, Take 1 tablet (10 mg total) by mouth daily., Disp: 90 tablet, Rfl: 0   fluticasone  (FLONASE ) 50 MCG/ACT nasal spray, Place 2 sprays into both nostrils daily., Disp: 16 g, Rfl: 0   ibuprofen  (ADVIL ) 800 MG tablet, Take 1 tablet (800 mg total) by mouth every 8 (eight) hours as needed., Disp: 30 tablet, Rfl: 0   loperamide  (IMODIUM  A-D) 2 MG tablet, Take 1 tablet (2 mg total) by mouth 4 (four) times daily as needed for diarrhea or loose stools., Disp: 30 tablet, Rfl: 0   pantoprazole  (PROTONIX ) 20 MG tablet, Take 20 mg by mouth daily., Disp: , Rfl:    sertraline  (ZOLOFT ) 100 MG tablet, Take 1 tablet (100 mg total) by mouth at bedtime., Disp: 30 tablet, Rfl: 0   SPRINTEC 28 0.25-35 MG-MCG tablet, TAKE ONE TABLET BY MOUTH ONCE DAILY, Disp: 28 tablet, Rfl: 12   SYMBICORT 80-4.5 MCG/ACT inhaler, Inhale 2 puffs into the lungs 2 (two) times daily., Disp: , Rfl:    traZODone (DESYREL) 50 MG tablet, Take 50 mg by mouth at bedtime as needed., Disp: , Rfl:    Vitamin D , Ergocalciferol , (DRISDOL) 1.25 MG (50000 UNIT) CAPS  capsule, Take 50,000 Units by mouth once a week., Disp: , Rfl:   Observations/Objective: Patient is well-developed, well-nourished in no acute distress.  Resting comfortably  Head is normocephalic, atraumatic.  No labored breathing.  Speech is clear and coherent with logical content.  Patient is alert and oriented at baseline.    Assessment and Plan: 1. Acute cough (Primary) - brompheniramine-pseudoephedrine-DM 30-2-10 MG/5ML syrup; Take 5 mLs by mouth 4 (four) times daily as needed.  Dispense: 120 mL; Refill: 0  2. Nausea and vomiting, unspecified vomiting type - ondansetron  (ZOFRAN -ODT) 4 MG disintegrating tablet; Take 1 tablet (4 mg total) by mouth every 8 (eight) hours as needed.  Dispense: 20 tablet; Refill: 0  3. Moderate persistent asthma with exacerbation - predniSONE  (DELTASONE ) 20 MG tablet; Take 2 tablets (40 mg total) by mouth daily with breakfast.  Dispense: 14 tablet; Refill: 0  4. Flu-like symptoms  5. Exposure to the flu  - Suspect viral URI/possible influenza as there was positive exposure but is out  of anti-viral treatment window - Symptomatic medications of choice over the counter as needed - Added Bromfed DM for cough - Added Zofran  for nausea and vomiting - Prednisone  added for asthma exacerbation - Continue inhalers as prescribed - Push fluids - Rest - Work note provided - Seek further evaluation if symptoms change or worsen   Follow Up Instructions: I discussed the assessment and treatment plan with the patient. The patient was provided an opportunity to ask questions and all were answered. The patient agreed with the plan and demonstrated an understanding of the instructions.  A copy of instructions were sent to the patient via MyChart unless otherwise noted below.    The patient was advised to call back or seek an in-person evaluation if the symptoms worsen or if the condition fails to improve as anticipated.    Delon CHRISTELLA Dickinson, PA-C

## 2024-04-30 NOTE — Patient Instructions (Signed)
 [name] [name], thank you for joining Delon CHRISTELLA Dickinson, PA-C for today's virtual visit.  While this provider is not your primary care provider (PCP), if your PCP is located in our provider database this encounter information will be shared with them immediately following your visit.   A [facility] MyChart account gives you access to today's visit and all your visits, tests, and labs performed at [facility]  click here if you don't have a [facility] MyChart account or go to [email or web address]  Consent: (Patient) [name] [name] provided verbal consent for this virtual visit at the beginning of the encounter.  Current Medications:  Current Outpatient Medications:    brompheniramine-pseudoephedrine-DM 30-2-10 MG/5ML syrup, Take 5 mLs by mouth 4 (four) times daily as needed., Disp: 120 mL, Rfl: 0   ondansetron  (ZOFRAN -ODT) 4 MG disintegrating tablet, Take 1 tablet (4 mg total) by mouth every 8 (eight) hours as needed., Disp: 20 tablet, Rfl: 0   predniSONE  (DELTASONE ) 20 MG tablet, Take 2 tablets (40 mg total) by mouth daily with breakfast., Disp: 14 tablet, Rfl: 0   albuterol  (VENTOLIN  HFA) 108 (90 Base) MCG/ACT inhaler, Inhale 2 puffs into the lungs every 6 (six) hours as needed for wheezing or shortness of breath., Disp: 8 g, Rfl: 0   ARIPiprazole (ABILIFY) 15 MG tablet, Take 15 mg by mouth at bedtime., Disp: , Rfl:    buPROPion (WELLBUTRIN XL) 300 MG 24 hr tablet, Take 300 mg by mouth daily., Disp: , Rfl:    cetirizine  (ZYRTEC ) 10 MG tablet, Take 1 tablet (10 mg total) by mouth daily., Disp: 90 tablet, Rfl: 0   fluticasone  (FLONASE ) 50 MCG/ACT nasal spray, Place 2 sprays into both nostrils daily., Disp: 16 g, Rfl: 0   ibuprofen  (ADVIL ) 800 MG tablet, Take 1 tablet (800 mg total) by mouth every 8 (eight) hours as needed., Disp: 30 tablet, Rfl: 0   loperamide  (IMODIUM  A-D) 2 MG tablet, Take 1 tablet (2 mg total) by mouth 4 (four) times daily as needed for diarrhea or  loose stools., Disp: 30 tablet, Rfl: 0   pantoprazole  (PROTONIX ) 20 MG tablet, Take 20 mg by mouth daily., Disp: , Rfl:    sertraline  (ZOLOFT ) 100 MG tablet, Take 1 tablet (100 mg total) by mouth at bedtime., Disp: 30 tablet, Rfl: 0   SPRINTEC 28 0.25-35 MG-MCG tablet, TAKE ONE TABLET BY MOUTH ONCE DAILY, Disp: 28 tablet, Rfl: 12   SYMBICORT 80-4.5 MCG/ACT inhaler, Inhale 2 puffs into the lungs 2 (two) times daily., Disp: , Rfl:    traZODone (DESYREL) 50 MG tablet, Take 50 mg by mouth at bedtime as needed., Disp: , Rfl:    Vitamin D , Ergocalciferol , (DRISDOL) 1.25 MG (50000 UNIT) CAPS capsule, Take 50,000 Units by mouth once a week., Disp: , Rfl:    Medications ordered in this encounter:  Meds ordered this encounter  Medications   brompheniramine-pseudoephedrine-DM 30-2-10 MG/5ML syrup    Sig: Take 5 mLs by mouth 4 (four) times daily as needed.    Dispense:  120 mL    Refill:  0    Supervising Provider:   BLAISE ALEENE KIDD [[id number]]   ondansetron  (ZOFRAN -ODT) 4 MG disintegrating tablet    Sig: Take 1 tablet (4 mg total) by mouth every 8 (eight) hours as needed.    Dispense:  20 tablet    Refill:  0    Supervising Provider:   BLAISE ALEENE KIDD [[id number]]   predniSONE  (DELTASONE ) 20 MG tablet    Sig: Take  2 tablets (40 mg total) by mouth daily with breakfast.    Dispense:  14 tablet    Refill:  0    Supervising Provider:   BLAISE ALEENE KIDD [phone]     *If you need refills on other medications prior to your next appointment, please contact your pharmacy*  Follow-Up: Call back or seek an in-person evaluation if the symptoms worsen or if the condition fails to improve as anticipated.  [facility] [phone]  Other Instructions Influenza, Adult Influenza is also called the flu. It's an infection that affects your respiratory tract. This includes your nose, throat, windpipe, and lungs. The flu is contagious. This means it spreads easily from person to person. It  causes symptoms that are like a cold. It can also cause a high fever and body aches. What are the causes? The flu is caused by the influenza virus. You can get it by: Breathing in droplets that are in the air after an infected person coughs or sneezes. Touching something that has the virus on it and then touching your mouth, nose, or eyes. What increases the risk? You may be more likely to get the flu if: You don't wash your hands often. You're near a lot of people during cold and flu season. You touch your mouth, eyes, or nose without washing your hands first. You don't get a flu shot each year. You may also be more at risk for the flu and serious problems, such as a lung infection called pneumonia, if: You're older than 65. You're pregnant. Your immune system is weak. Your immune system is your body's defense system. You have a long-term, or chronic, condition, such as: Heart, kidney, or lung disease. Diabetes. A liver disorder. Asthma. You're very overweight. You have anemia. This is when you don't have enough red blood cells in your body. What are the signs or symptoms? Flu symptoms often start all of a sudden. They may last 4-14 days and include: Fever and chills. Headaches, body aches, or muscle aches. Sore throat. Cough. Runny or stuffy nose. Discomfort in your chest. Not wanting to eat as much as normal. Feeling weak or tired. Feeling dizzy. Nausea or vomiting. How is this diagnosed? The flu may be diagnosed based on your symptoms and medical history. You may also have a physical exam. A swab may be taken from your nose or throat and tested for the virus. How is this treated? If the flu is found early, you can be treated with antiviral medicine. This may be given to you by mouth or through an IV. It can help you feel less sick and get better faster. Taking care of yourself at home can also help your symptoms get better. Your health care provider may tell you to: Take  over-the-counter medicines. Drink lots of fluids. The flu often goes away on its own. If you have very bad symptoms or problems caused by the flu, you may need to be treated in a hospital. Follow these instructions at home: Activity Rest as needed. Get lots of sleep. Stay home from work or school as told by your provider. Leave home only to go see your provider. Do not leave home for other reasons until you don't have a fever for 24 hours without taking medicine. Eating and drinking Take an oral rehydration solution (ORS). This is a drink that is sold at pharmacies and stores. Drink enough fluid to keep your pee pale yellow. Try to drink small amounts of clear  fluids. These include water, ice chips, fruit juice mixed with water, and low-calorie sports drinks. Try to eat bland foods that are easy to digest. These include bananas, applesauce, rice, lean meats, toast, and crackers. Avoid drinks that have a lot of sugar or caffeine  in them. These include energy drinks, regular sports drinks, and soda. Do not drink alcohol. Do not eat spicy or fatty foods. General instructions     Take your medicines only as told by your provider. Use a cool mist humidifier to add moisture to the air in your home. This can make it easier for you to breathe. You should also clean the humidifier every day. To do so: Empty the water. Pour clean water in. Cover your mouth and nose when you cough or sneeze. Wash your hands with soap and water often and for at least 20 seconds. It's extra important to do so after you cough or sneeze. If you can't use soap and water, use hand sanitizer. How is this prevented?  Get a flu shot every year. Ask your provider when you should get your flu shot. Stay away from people who are sick during fall and winter. Fall and winter are cold and flu season. Contact a health care provider if: You get new symptoms. You have chest pain. You have watery poop, also called diarrhea. You  have a fever. Your cough gets worse. You start to have more mucus. You feel like you may vomit, or you vomit. Get help right away if: You become short of breath or have trouble breathing. Your skin or nails turn blue. You have very bad pain or stiffness in your neck. You get a sudden headache or pain in your face or ear. You vomit each time you eat or drink. These symptoms may be an emergency. Call 911 right away. Do not wait to see if the symptoms will go away. Do not drive yourself to the hospital. This information is not intended to replace advice given to you by your health care provider. Make sure you discuss any questions you have with your health care provider. Document Revised: 04/10/2023 Document Reviewed: 08/15/2022 Elsevier Patient Education  2024 [employer]   If you have been instructed to have an in-person evaluation today at a local Urgent Care facility, please use the link below. It will take you to a list of all of our available [facility], including address, phone number and hours of operation. Please do not delay care.  [facility]  If you or a family member do not have a primary care provider, use the link below to schedule a visit and establish care. When you choose a [facility] primary care physician or advanced practice provider, you gain a long-term partner in health. Find a Primary Care Provider  Learn more about [facility] in-office and virtual care options: [place] - Get Care Now

## 2024-06-01 ENCOUNTER — Ambulatory Visit
Admission: EM | Admit: 2024-06-01 | Discharge: 2024-06-01 | Disposition: A | Discharge status: Home / Self Care | Attending: Family Medicine | Admitting: Family Medicine

## 2024-06-01 DIAGNOSIS — N926 Irregular menstruation, unspecified: Secondary | ICD-10-CM | POA: Insufficient documentation

## 2024-06-01 DIAGNOSIS — R21 Rash and other nonspecific skin eruption: Secondary | ICD-10-CM | POA: Insufficient documentation

## 2024-06-01 LAB — POCT URINE PREGNANCY: Preg Test, Ur: NEGATIVE

## 2024-06-01 MED ORDER — VALACYCLOVIR HCL 1 G PO TABS: 1000.0000 mg | ORAL_TABLET | Freq: Three times a day (TID) | ORAL | 0 refills | Status: AC

## 2024-06-01 NOTE — Discharge Instructions (Signed)
 The clinic will contact you with results of the swab done today if positive.  Start valacyclovir 3 times a day for 7 days.  Please follow-up with your PCP in 2 to 3 days for recheck.  Please go to the emergency room if you develop any worsening symptoms.  I hope you feel better soon!

## 2024-06-01 NOTE — ED Provider Notes (Signed)
 [facility]    CSN: [id number] Arrival date & time: 06/01/24  1508      History   Chief Complaint Chief Complaint  Patient presents with   insect bite    HPI [name] [name] is a 38 y.o. female presents for possible bug bite.  Patient reports today she noticed a painful burning bump on the back of her scalp at the hairline.  Does not know if she got bitten by anything.  No fevers or chills.  She also would like a pregnancy test as she has irregular periods.  No history of eczema or psoriasis.  No other concerns at this time.  HPI  Past Medical History:  Diagnosis Date   Abnormal Pap smear    Alpha thalassemia silent carrier 12/04/2021   FOB___   Anxiety    Anxiety and depression 02/01/2022   Asthma    Bipolar 1 disorder (HCC)    Borderline personality disorder (HCC)    Depression    GERD (gastroesophageal reflux disease)    IUD (intrauterine device) in place 04/12/2013   Had IUD inserted 03/29/13 could not feel strings went to ER 9/18 and KUB saw IUD still could not see string, can't see now but seen in US    Kidney infection    Morbid obesity (HCC)    Pregnancy    Pregnancy induced hypertension    PTSD (post-traumatic stress disorder)    UTI (urinary tract infection)     Patient Active Problem List   Diagnosis Date Noted   Abnormal uterine bleeding (AUB) 11/19/2022   DOE (dyspnea on exertion) 10/31/2022   Visit for routine gyn exam 08/29/2022   Breakthrough bleeding on birth control pills 08/29/2022   Encounter for surveillance of contraceptive pills 07/02/2022   Long term current use of antipsychotic medication 06/24/2022   Cannabis use disorder 06/24/2022   GERD (gastroesophageal reflux disease) 06/12/2022   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   Severe episode of recurrent major depressive disorder, without psychotic features (HCC) 04/19/2022   PTSD (post-traumatic stress disorder) 04/19/2022   Asthma 01/08/2022   Bipolar disorder (HCC)  11/14/2021   Generalized anxiety disorder 11/14/2021   Previous cesarean section 11/14/2021   History of pyelonephritis 11/08/2021   Morbid obesity (HCC)    Snoring 08/03/2013    Past Surgical History:  Procedure Laterality Date   CESAREAN SECTION     HYSTEROSCOPY N/A 01/09/2021   Procedure: HYSTEROSCOPY;  Surgeon: Marilynn Nest, DO;  Location: [facility];  Service: Gynecology;  Laterality: N/A;   IUD REMOVAL N/A 01/09/2021   Procedure: INTRAUTERINE DEVICE (IUD) REMOVAL;  Surgeon: Ozan, Jennifer, DO;  Location: [facility];  Service: Gynecology;  Laterality: N/A;   TOOTH EXTRACTION      OB History     Gravida  5   Para  3   Term  2   Preterm  1   AB  2   Living  3      SAB  2   IAB  0   Ectopic  0   Multiple  0   Live Births  3            Home Medications    Prior to Admission medications   Medication Sig Start Date End Date Taking? Authorizing Provider  valACYclovir (VALTREX) 1000 MG tablet Take 1 tablet (1,000 mg total) by mouth 3 (three) times daily for 7 days. 06/01/24 06/08/24 Yes Loreda Myla SAUNDERS, NP  albuterol  (VENTOLIN  HFA) 108 (90 Base)  MCG/ACT inhaler Inhale 2 puffs into the lungs every 6 (six) hours as needed for wheezing or shortness of breath. 01/08/22   Bacchus, Meade PEDLAR, FNP  ARIPiprazole (ABILIFY) 15 MG tablet Take 15 mg by mouth at bedtime. 08/19/22   [provider]  brompheniramine-pseudoephedrine-DM 30-2-10 MG/5ML syrup Take 5 mLs by mouth 4 (four) times daily as needed. 04/30/24   Vivienne Delon HERO, PA-C  buPROPion (WELLBUTRIN XL) 300 MG 24 hr tablet Take 300 mg by mouth daily.    [provider]  cetirizine  (ZYRTEC ) 10 MG tablet Take 1 tablet (10 mg total) by mouth daily. 01/08/22   Bacchus, Gloria Z, FNP  fluticasone  (FLONASE ) 50 MCG/ACT nasal spray Place 2 sprays into both nostrils daily. 09/23/22   Moishe Chiquita HERO, NP  ibuprofen  (ADVIL ) 800 MG tablet Take 1 tablet (800 mg total) by mouth every 8 (eight) hours as needed. 04/24/24    Fleming, Zelda W, NP  loperamide  (IMODIUM  A-D) 2 MG tablet Take 1 tablet (2 mg total) by mouth 4 (four) times daily as needed for diarrhea or loose stools. 11/24/23   Vivienne Delon HERO, PA-C  ondansetron  (ZOFRAN -ODT) 4 MG disintegrating tablet Take 1 tablet (4 mg total) by mouth every 8 (eight) hours as needed. 04/30/24   Vivienne Delon HERO, PA-C  pantoprazole  (PROTONIX ) 20 MG tablet Take 20 mg by mouth daily. 02/25/23   [provider]  predniSONE  (DELTASONE ) 20 MG tablet Take 2 tablets (40 mg total) by mouth daily with breakfast. 04/30/24   Vivienne Delon HERO, PA-C  sertraline  (ZOLOFT ) 100 MG tablet Take 1 tablet (100 mg total) by mouth at bedtime. 08/08/22 12/27/22  Barbra Jayson LABOR, MD  SPRINTEC 28 0.25-35 MG-MCG tablet TAKE ONE TABLET BY MOUTH ONCE DAILY 09/29/23   Eure, Luther H, MD  SYMBICORT 80-4.5 MCG/ACT inhaler Inhale 2 puffs into the lungs 2 (two) times daily. 06/03/23   [provider]  traZODone (DESYREL) 50 MG tablet Take 50 mg by mouth at bedtime as needed. 08/19/22   [provider]  Vitamin D , Ergocalciferol , (DRISDOL) 1.25 MG (50000 UNIT) CAPS capsule Take 50,000 Units by mouth once a week. 06/03/23   [provider]    Family History Family History  Problem Relation Age of Onset   Diabetes Paternal Grandfather    Hypertension Paternal Grandmother    Diabetes Paternal Grandmother    Hypertension Father    Diabetes Father    Congestive Heart Failure Father    ADD / ADHD Son    Diabetes Paternal Aunt    Diabetes Paternal Uncle     Social History Social History   Tobacco Use   Smoking status: Former    Current packs/day: 0.00    Types: Cigarettes    Quit date: 05/21/2007    Years since quitting: 17.0   Smokeless tobacco: Never  Vaping Use   Vaping status: Former  Substance Use Topics   Alcohol use: Not Currently   Drug use: Not Currently    Types: Marijuana     Allergies   Shellfish allergy   Review of  Systems Review of Systems  Skin:        Bump on back of hairline     Physical Exam Triage Vital Signs ED Triage Vitals  Encounter Vitals Group     BP 06/01/24 1530 135/79     Girls Systolic BP Percentile --      Girls Diastolic BP Percentile --      Boys Systolic BP Percentile --  Boys Diastolic BP Percentile --      Pulse Rate 06/01/24 1530 (!) 102     Resp 06/01/24 1530 (!) 21     Temp 06/01/24 1530 98.3 F (36.8 C)     Temp src --      SpO2 06/01/24 1530 97 %     Weight --      Height --      Head Circumference --      Peak Flow --      Pain Score 06/01/24 1529 0     Pain Loc --      Pain Education --      Exclude from Growth Chart --    No data found.  Updated Vital Signs BP 135/79 (BP Location: Right Arm)   Pulse (!) 102   Temp 98.3 F (36.8 C)   Resp (!) 21   LMP 03/01/2024 (Approximate)   SpO2 97%   Visual Acuity Right Eye Distance:   Left Eye Distance:   Bilateral Distance:    Right Eye Near:   Left Eye Near:    Bilateral Near:     Physical Exam Vitals and nursing note reviewed.  Constitutional:      General: She is not in acute distress.    Appearance: She is obese. She is not ill-appearing.  HENT:     Head: Normocephalic and atraumatic.  Eyes:     Pupils: Pupils are equal, round, and reactive to light.  Cardiovascular:     Rate and Rhythm: Tachycardia present.     Comments: Heart rate 102 Pulmonary:     Effort: Pulmonary effort is normal.  Skin:    General: Skin is warm and dry.         Comments: There is a painful erythematous vesicular rash to the right posterior head at the scalp line.  No drainage or swelling.  Neurological:     General: No focal deficit present.     Mental Status: She is alert and oriented to person, place, and time.  Psychiatric:        Mood and Affect: Mood normal.        Behavior: Behavior normal.      UC Treatments / Results  Labs (all labs ordered are listed, but only abnormal results are  displayed) Labs Reviewed  VARICELLA-ZOSTER BY PCR  POCT URINE PREGNANCY    EKG   Radiology No results found.  Procedures Procedures (including critical care time)  Medications Ordered in UC Medications - No data to display  Initial Impression / Assessment and Plan / UC Course  I have reviewed the triage vital signs and the nursing notes.  Pertinent labs & imaging results that were available during my care of the patient were reviewed by me and considered in my medical decision making (see chart for details).     Reviewed exam and symptoms with patient.  Negative urine hCG.  Suspect possible shingles rash, swab obtained and will start Valtrex.  Discussed avoidance of high risk populations.  OTC analgesics as needed.  PCP follow-up 2 to 3 days for recheck.  ER precautions reviewed. Final Clinical Impressions(s) / UC Diagnoses   Final diagnoses:  Irregular periods  Rash     Discharge Instructions      The clinic will contact you with results of the swab done today if positive.  Start valacyclovir 3 times a day for 7 days.  Please follow-up with your PCP in 2 to 3 days for recheck.  Please go to the emergency room if you develop any worsening symptoms.  I hope you feel better soon!    ED Prescriptions     Medication Sig Dispense Auth. Provider   valACYclovir (VALTREX) 1000 MG tablet Take 1 tablet (1,000 mg total) by mouth 3 (three) times daily for 7 days. 21 tablet [name] [name], Jodi R, NP      PDMP not reviewed this encounter.   Loreda Myla SAUNDERS, NP 06/01/24 [phone]

## 2024-06-01 NOTE — ED Triage Notes (Signed)
 Pt present with c/o a lump on the back of her neck. States she think is it a spider bite and has pain when moving her head. Pt is also requesting a pregnancy test.

## 2024-06-03 ENCOUNTER — Telehealth

## 2024-06-04 LAB — VARICELLA-ZOSTER BY PCR: Varicella-Zoster, PCR: NEGATIVE

## 2024-06-24 NOTE — Progress Notes (Unsigned)
 NEUROLOGY CONSULTATION NOTE  [name] MRN: [id number] DOB: 1986-02-17  Referring provider: Lannie Purdue, MD Primary care provider: No PCP  Reason for consult:  headaches  Assessment/Plan:   Migraine without aura, without status migrainosus, not intractable Excessive daytime sleepiness   Refer to sleep medicine for evaluation of sleep apnea Refer to ophthalmology for evaluation of papilledema Migraine prevention:  Increase topiramate  to 50mg  at bedtime.  We can increase to 100mg  at bedtime in 5 weeks if needed. Migraine rescue:  Stop rizatriptan and OTC analgesics.  She will try sumatriptan  100mg ; Zofran  4mg  for nausea Lifestyle modification: Limit use of pain relievers to no more than 9 days out of the month to prevent risk of rebound or medication-overuse headache. Diet modification/hydration/caffeine  cessation Routine exercise Sleep hygiene Consider vitamins/supplements:  magnesium citrate 400mg  daily, riboflavin 400mg  daily, CoQ10 100mg  three times daily Keep headache diary Follow up 6 months.    Subjective:  [name] [name] is a 38 year old female who presents for headaches.  History supplemented by referring provider' snote.   Onset:  62-34 years old.  Started getting frequent and more severe at the beginning of 2025 Location:  back of neck, never paid attention to location on head but usually bilaterally Quality:  pounding Intensity:  7/10.  Aura:  absent Prodrome:  absent Associated symptoms:  Nausea, vomiting, photophobia, phonophobia, osmophobia, blurred vision.  She denies associated unilateral numbness or weakness. Duration:  usually 1 day, up to 3 days Frequency:  3 to 4 a week (14 headache days a month) Frequency of abortive medication: 3 to 4 days a week Triggers:  emotional stress Relieving factors:  hot compress on head, close eyes and rest or sleep in dark and quiet room Activity:  aggravates Denies visual obscurations or pulsatile  tinnitus.  Past NSAIDS/analgesics:  Goody or BC powder, ibuprofen , Tylenol  Past abortive triptans:  none Past abortive ergotamine:  none Past muscle relaxants:  none Past anti-emetic:  none Past antihypertensive medications:  none Past antidepressant medications:  none Past anticonvulsant medications:  none Past anti-CGRP:  none Past vitamins/Herbal/Supplements:  none Past antihistamines/decongestants:  none Other past therapies:  none  Current NSAIDS/analgesics:  Excedrin Migraine Current triptans:  rizatriptan 10mg  Current ergotamine:  none Current anti-emetic:  Zofran -ODT 4mg  Current muscle relaxants:  none Current Antihypertensive medications:  losartan 50mg  Current Antidepressant medications:  sertraline  100mg  daily, wellbutrin XL 300mg  daily, trazodone 50mg  at bedtime PRN Current Anticonvulsant medications:  topiramate  25mg  daily (just started last Tuesday) Current anti-CGRP:  none Current Vitamins/Herbal/Supplements:  none Current Antihistamines/Decongestants:  Flonase , Zyrtec  Birth control:  none.  Discontinued a year ago. Other medications:  Abilfy   Caffeine :  1 Mt Dew a month Diet:  On a calorie deficit diet Depression:  controlled; Anxiety:  controlled Sleep hygiene:  Poor.  Wakes up frequently and difficult to fall asleep (mind races).  Excessive daytime sleepiness.  Checked for sleep apnea years ago and was negative.    History of TBI/concussion:  no Family history of headache:  no Family history of cerebral aneurysm:  no   PAST MEDICAL HISTORY: Past Medical History:  Diagnosis Date   Abnormal Pap smear    Alpha thalassemia silent carrier 12/04/2021   FOB___   Anxiety    Anxiety and depression 02/01/2022   Asthma    Bipolar 1 disorder (HCC)    Borderline personality disorder (HCC)    Depression    GERD (gastroesophageal reflux disease)    IUD (intrauterine device) in place 04/12/2013  Had IUD inserted 03/29/13 could not feel strings went to ER 9/18  and KUB saw IUD still could not see string, can't see now but seen in US    Kidney infection    Morbid obesity (HCC)    Pregnancy    Pregnancy induced hypertension    PTSD (post-traumatic stress disorder)    UTI (urinary tract infection)     PAST SURGICAL HISTORY: Past Surgical History:  Procedure Laterality Date   CESAREAN SECTION     HYSTEROSCOPY N/A 01/09/2021   Procedure: HYSTEROSCOPY;  Surgeon: Marilynn Nest, DO;  Location: [facility];  Service: Gynecology;  Laterality: N/A;   IUD REMOVAL N/A 01/09/2021   Procedure: INTRAUTERINE DEVICE (IUD) REMOVAL;  Surgeon: Ozan, Jennifer, DO;  Location: [facility];  Service: Gynecology;  Laterality: N/A;   TOOTH EXTRACTION      MEDICATIONS: Current Outpatient Medications on File Prior to Visit  Medication Sig Dispense Refill   albuterol  (VENTOLIN  HFA) 108 (90 Base) MCG/ACT inhaler Inhale 2 puffs into the lungs every 6 (six) hours as needed for wheezing or shortness of breath. 8 g 0   ARIPiprazole (ABILIFY) 15 MG tablet Take 15 mg by mouth at bedtime.     brompheniramine-pseudoephedrine-DM 30-2-10 MG/5ML syrup Take 5 mLs by mouth 4 (four) times daily as needed. 120 mL 0   buPROPion (WELLBUTRIN XL) 300 MG 24 hr tablet Take 300 mg by mouth daily.     cetirizine  (ZYRTEC ) 10 MG tablet Take 1 tablet (10 mg total) by mouth daily. 90 tablet 0   fluticasone  (FLONASE ) 50 MCG/ACT nasal spray Place 2 sprays into both nostrils daily. 16 g 0   ibuprofen  (ADVIL ) 800 MG tablet Take 1 tablet (800 mg total) by mouth every 8 (eight) hours as needed. 30 tablet 0   loperamide  (IMODIUM  A-D) 2 MG tablet Take 1 tablet (2 mg total) by mouth 4 (four) times daily as needed for diarrhea or loose stools. 30 tablet 0   ondansetron  (ZOFRAN -ODT) 4 MG disintegrating tablet Take 1 tablet (4 mg total) by mouth every 8 (eight) hours as needed. 20 tablet 0   pantoprazole  (PROTONIX ) 20 MG tablet Take 20 mg by mouth daily.     predniSONE  (DELTASONE ) 20 MG tablet Take 2 tablets (40 mg  total) by mouth daily with breakfast. 14 tablet 0   sertraline  (ZOLOFT ) 100 MG tablet Take 1 tablet (100 mg total) by mouth at bedtime. 30 tablet 0   SPRINTEC 28 0.25-35 MG-MCG tablet TAKE ONE TABLET BY MOUTH ONCE DAILY 28 tablet 12   SYMBICORT 80-4.5 MCG/ACT inhaler Inhale 2 puffs into the lungs 2 (two) times daily.     traZODone (DESYREL) 50 MG tablet Take 50 mg by mouth at bedtime as needed.     Vitamin D , Ergocalciferol , (DRISDOL) 1.25 MG (50000 UNIT) CAPS capsule Take 50,000 Units by mouth once a week.     No current facility-administered medications on file prior to visit.    ALLERGIES: Allergies  Allergen Reactions   Shellfish Allergy Anaphylaxis    Throat swelling    FAMILY HISTORY: Family History  Problem Relation Age of Onset   Diabetes Paternal Grandfather    Hypertension Paternal Grandmother    Diabetes Paternal Grandmother    Hypertension Father    Diabetes Father    Congestive Heart Failure Father    ADD / ADHD Son    Diabetes Paternal Aunt    Diabetes Paternal Uncle     Objective:  Blood pressure 134/68, pulse 91, height 5' 5 (  1.651 m), weight (!) 361 lb (163.7 kg), last menstrual period 03/01/2024, SpO2 97%. General: No acute distress.  Patient appears well-groomed.   Head:  Normocephalic/atraumatic Eyes:  fundi examined but not visualized Neck: supple, no paraspinal tenderness, full range of motion Heart: regular rate and rhythm Neurological Exam: Mental status: alert and oriented to person, place, and time, speech fluent and not dysarthric, language intact. Cranial nerves: CN I: not tested CN II: pupils equal, round and reactive to light, visual fields intact CN III, IV, VI:  full range of motion, no nystagmus, no ptosis CN V: facial sensation intact. CN VII: upper and lower face symmetric CN VIII: hearing intact CN IX, X: gag intact, uvula midline CN XI: sternocleidomastoid and trapezius muscles intact CN XII: tongue midline Bulk & Tone: normal,  no fasciculations. Motor:  muscle strength 5/5 throughout Sensation:  Pinprick and vibratory sensation intact. Deep Tendon Reflexes:  2+ throughout,  toes downgoing.   Finger to nose testing:  Without dysmetria.   Gait:  Normal station and stride.  Romberg negative.    Thank you for allowing me to take part in the care of this patient.  Juliene Dunnings, DO

## 2024-06-28 ENCOUNTER — Ambulatory Visit: Admitting: Neurology

## 2024-06-28 VITALS — BP 134/68 | HR 91 | Ht 65.0 in | Wt 361.0 lb

## 2024-06-28 DIAGNOSIS — G43009 Migraine without aura, not intractable, without status migrainosus: Secondary | ICD-10-CM

## 2024-06-28 DIAGNOSIS — G4719 Other hypersomnia: Secondary | ICD-10-CM | POA: Diagnosis not present

## 2024-06-28 DIAGNOSIS — H538 Other visual disturbances: Secondary | ICD-10-CM

## 2024-06-28 MED ORDER — SUMATRIPTAN SUCCINATE 100 MG PO TABS: ORAL_TABLET | ORAL | 5 refills | Status: DC

## 2024-06-28 MED ORDER — TOPIRAMATE 50 MG PO TABS: 50.0000 mg | ORAL_TABLET | Freq: Every day | ORAL | 5 refills | Status: DC

## 2024-06-28 NOTE — Patient Instructions (Signed)
  Refer to sleep medicine Refer for formal eye exam Increase topiramate  to 50mg  at bedtime.  Contact us  in 5 weeks with update and we can increase dose if needed. STOP RIZATRIPTAN and EXCEDRIN.  Take sumatriptan  100mg  at earliest onset of headache.  May repeat dose once in 2 hours if needed.  Maximum 2 tablets in 24 hours. Ondansetron /zofran  for nausea Limit use of pain relievers to no more than 9 days out of the month.  These medications include acetaminophen , NSAIDs (ibuprofen /Advil /Motrin , naproxen /Aleve , triptans (Imitrex /sumatriptan ), Excedrin, and narcotics.  This will help reduce risk of rebound headaches. Be aware of common food triggers Routine exercise Stay adequately hydrated (aim for 64 oz water daily) Keep headache diary Maintain proper stress management Maintain proper sleep hygiene Do not skip meals Consider supplements:  magnesium citrate 400mg  daily, riboflavin 400mg  daily, coenzyme Q10 100mg  three times daily.

## 2024-06-29 ENCOUNTER — Encounter: Payer: Self-pay | Admitting: Neurology

## 2024-07-01 ENCOUNTER — Ambulatory Visit: Admitting: Pulmonary Disease

## 2024-07-09 ENCOUNTER — Ambulatory Visit
Admission: RE | Admit: 2024-07-09 | Discharge: 2024-07-09 | Disposition: A | Discharge status: Home / Self Care | Source: Ambulatory Visit | Attending: Family Medicine | Admitting: Family Medicine

## 2024-07-09 VITALS — BP 138/89 | HR 100 | Temp 97.7°F | Resp 18

## 2024-07-09 DIAGNOSIS — Z3201 Encounter for pregnancy test, result positive: Secondary | ICD-10-CM | POA: Diagnosis not present

## 2024-07-09 DIAGNOSIS — J209 Acute bronchitis, unspecified: Secondary | ICD-10-CM

## 2024-07-09 DIAGNOSIS — R112 Nausea with vomiting, unspecified: Secondary | ICD-10-CM | POA: Diagnosis not present

## 2024-07-09 LAB — POCT URINE DIPSTICK
Blood, UA: NEGATIVE
Glucose, UA: NEGATIVE mg/dL
Ketones, POC UA: NEGATIVE mg/dL
Leukocytes, UA: NEGATIVE
Nitrite, UA: NEGATIVE
Protein Ur, POC: NEGATIVE mg/dL
Spec Grav, UA: >=1.03 — AB
Urobilinogen, UA: 0.2 U/dL
pH, UA: 5.5

## 2024-07-09 LAB — POCT URINE PREGNANCY: Preg Test, Ur: POSITIVE — AB

## 2024-07-09 MED ORDER — AZITHROMYCIN 250 MG PO TABS: ORAL_TABLET | ORAL | 0 refills | Status: DC

## 2024-07-09 MED ORDER — DOXYLAMINE-PYRIDOXINE 10-10 MG PO TBEC: 1.0000 | DELAYED_RELEASE_TABLET | Freq: Two times a day (BID) | ORAL | 0 refills | Status: AC | PRN

## 2024-07-09 NOTE — Discharge Instructions (Addendum)
 Avoid second hand exposure to smoke as much as possible. Start azithromycin  to help with bronchitis. Use Diclegis  for nausea and vomiting in pregnancy.  I recommend seeking a consultation with an obstetrician.  Please start a prenatal vitamin.  Avoid any alcohol use.  Do not use any nonsteroidal anti-inflammatories (NSAIDs) like ibuprofen , Motrin , naproxen , Aleve , etc. which are all available over-the-counter.  Please just use Tylenol  at a dose of 500mg -650mg  once every 6 hours as needed for your aches, pains, fevers.

## 2024-07-09 NOTE — ED Triage Notes (Signed)
 Pt c/o of dry cough, nausea, chest congestion for 3 weeks. Denies sore throat, fever, or nasal congestion. Patient has taken Tylenol  for symptoms, last dose was about 1 week ago. Pt did not see any benefit of the Tylenol . Patient did not try any cold or flu medications due to HTN. Patient has not been around anyone with similar symptoms. Patient reports constant nausea, does have a prescription for Zofran  BID but it has not provided any relief. Patient reports vomiting daily about 20 minutes after eating. Patient able to tolerate liquids ok.

## 2024-07-09 NOTE — ED Provider Notes (Signed)
 " [facility] - [facility]  Note:  This document was prepared using Conservation officer, historic buildings and may include unintentional dictation errors.  MRN: [id number] DOB: 1985/11/22  Subjective:   [name] [name] is a 38 y.o. female presenting for 3 week history of persistent coughing, chest congestion, nausea with vomiting. Has been using her Zofran  with minimal relief. Has been tolerating fluids. No smoking of any kind including cigarettes, cigars, vaping, marijuana use. Has exposure to second hand smoke from her husband. LMP was 06/07/2024.  No fever, sinus congestion, sinus pain, ear pain, throat pain, shortness of breath or wheezing.  Has a history of asthma. Denies abdominal pain, pelvic pain, rashes, dysuria, urinary frequency, hematuria, vaginal discharge.    Current Outpatient Medications  Medication Instructions   albuterol  (VENTOLIN  HFA) 108 (90 Base) MCG/ACT inhaler 2 puffs, Inhalation, Every 6 hours PRN   ARIPiprazole (ABILIFY) 15 mg, Daily at bedtime   cetirizine  (ZYRTEC ) 10 mg, Oral, Daily   fluticasone  (FLONASE ) 50 MCG/ACT nasal spray 2 sprays, Each Nare, Daily   losartan (COZAAR) 50 mg, Daily   pantoprazole  (PROTONIX ) 20 mg, Daily   sertraline  (ZOLOFT ) 100 mg, Oral, Daily at bedtime   SUMAtriptan  (IMITREX ) 100 MG tablet Take 1 tablet at earliest onset of headache.  May repeat in 2 hours if headache persists or recurs.  Maximum 2 tablets in 24 hours.   SYMBICORT 80-4.5 MCG/ACT inhaler 2 puffs, 2 times daily   topiramate  (TOPAMAX ) 50 mg, Oral, Daily at bedtime   traZODone (DESYREL) 50 mg, At bedtime PRN    Allergies[1]  Past Medical History:  Diagnosis Date   Abnormal Pap smear    Alpha thalassemia silent carrier 12/04/2021   FOB___   Anxiety    Anxiety and depression 02/01/2022   Asthma    Bipolar 1 disorder (HCC)    Borderline personality disorder (HCC)    Depression    GERD (gastroesophageal reflux disease)    IUD (intrauterine device) in place  04/12/2013   Had IUD inserted 03/29/13 could not feel strings went to ER 9/18 and KUB saw IUD still could not see string, can't see now but seen in US    Kidney infection    Morbid obesity (HCC)    Pregnancy    Pregnancy induced hypertension    PTSD (post-traumatic stress disorder)    UTI (urinary tract infection)      Past Surgical History:  Procedure Laterality Date   CESAREAN SECTION     HYSTEROSCOPY N/A 01/09/2021   Procedure: HYSTEROSCOPY;  Surgeon: Marilynn Nest, DO;  Location: [facility];  Service: Gynecology;  Laterality: N/A;   IUD REMOVAL N/A 01/09/2021   Procedure: INTRAUTERINE DEVICE (IUD) REMOVAL;  Surgeon: Ozan, Jennifer, DO;  Location: [facility];  Service: Gynecology;  Laterality: N/A;   TOOTH EXTRACTION      Family History  Problem Relation Age of Onset   Diabetes Paternal Grandfather    Hypertension Paternal Grandmother    Diabetes Paternal Grandmother    Hypertension Father    Diabetes Father    Congestive Heart Failure Father    ADD / ADHD Son    Diabetes Paternal Aunt    Diabetes Paternal Uncle     Social History   Occupational History   Not on file  Tobacco Use   Smoking status: Former    Current packs/day: 0.00    Types: Cigarettes    Quit date: 05/21/2007    Years since quitting: 17.1   Smokeless tobacco: Never  Vaping  Use   Vaping status: Former  Substance and Sexual Activity   Alcohol use: Not Currently   Drug use: Not Currently    Types: Marijuana   Sexual activity: Yes    Partners: Male     ROS   Objective:   Vitals: BP 138/89 (BP Location: Other (Comment)) Comment (BP Location): (R) forearm  Pulse 100   Temp 97.7 F (36.5 C) (Oral)   Resp 18   SpO2 97%   Physical Exam Constitutional:      General: She is not in acute distress.    Appearance: Normal appearance. She is well-developed and normal weight. She is not ill-appearing, toxic-appearing or diaphoretic.  HENT:     Head: Normocephalic and atraumatic.     Right Ear: Tympanic  membrane, ear canal and external ear normal. No drainage or tenderness. No middle ear effusion. There is no impacted cerumen. Tympanic membrane is not erythematous or bulging.     Left Ear: Tympanic membrane, ear canal and external ear normal. No drainage or tenderness.  No middle ear effusion. There is no impacted cerumen. Tympanic membrane is not erythematous or bulging.     Nose: Nose normal. No congestion or rhinorrhea.     Mouth/Throat:     Mouth: Mucous membranes are moist. No oral lesions.     Pharynx: No pharyngeal swelling, oropharyngeal exudate, posterior oropharyngeal erythema or uvula swelling.     Tonsils: No tonsillar exudate or tonsillar abscesses.     Comments: Thick streaks of postnasal drainage overlying pharynx.  Eyes:     General: No scleral icterus.       Right eye: No discharge.        Left eye: No discharge.     Extraocular Movements: Extraocular movements intact.     Right eye: Normal extraocular motion.     Left eye: Normal extraocular motion.     Conjunctiva/sclera: Conjunctivae normal.  Cardiovascular:     Rate and Rhythm: Normal rate and regular rhythm.     Heart sounds: Normal heart sounds. No murmur heard.    No friction rub. No gallop.  Pulmonary:     Effort: Pulmonary effort is normal. No respiratory distress.     Breath sounds: No stridor. No wheezing, rhonchi or rales.  Chest:     Chest wall: No tenderness.  Abdominal:     General: Bowel sounds are normal. There is no distension.     Palpations: Abdomen is soft. There is no mass.     Tenderness: There is no abdominal tenderness. There is no right CVA tenderness, left CVA tenderness, guarding or rebound.  Musculoskeletal:     Cervical back: Normal range of motion and neck supple.  Lymphadenopathy:     Cervical: No cervical adenopathy.  Skin:    General: Skin is warm and dry.  Neurological:     General: No focal deficit present.     Mental Status: She is alert and oriented to person, place, and  time.  Psychiatric:        Mood and Affect: Mood normal.        Behavior: Behavior normal.        Thought Content: Thought content normal.        Judgment: Judgment normal.     Results for orders placed or performed during the hospital encounter of 07/09/24 (from the past 24 hours)  POCT urine pregnancy     Status: Abnormal   Collection Time: 07/09/24 10:42 AM  Result Value Ref Range  Preg Test, Ur Positive (A) Negative  POCT URINE DIPSTICK     Status: Abnormal   Collection Time: 07/09/24 10:49 AM  Result Value Ref Range   Color, UA yellow yellow   Clarity, UA clear clear   Glucose, UA negative negative mg/dL   Bilirubin, UA small (A) negative   Ketones, POC UA negative negative mg/dL   Spec Grav, UA >=8.969 (A) 1.010 - 1.025   Blood, UA negative negative   pH, UA 5.5 5.0 - 8.0   Protein Ur, POC negative negative mg/dL   Urobilinogen, UA 0.2 0.2 or 1.0 E.U./dL   Nitrite, UA Negative Negative   Leukocytes, UA Negative Negative    Assessment and Plan :   PDMP not reviewed this encounter.  1. Acute bronchitis, unspecified organism   2. Nausea and vomiting, unspecified vomiting type   3. Positive pregnancy test      Given positive pregnancy test, we will avoid chest x-ray.  Recommended covering for bronchitis with azithromycin  and supportive care.  Avoid secondhand exposure to smoke.  Use Diclegis  for nausea and vomiting in pregnancy.  Anticipatory guidance provided to patient regarding prenatal care.  She is to start a prenatal vitamin and counseled on avoiding alcohol, cigarettes, drug use, caffeine .  Also counseled on medications that are safe in pregnancy over-the-counter. Counseled patient on potential for adverse effects with medications prescribed/recommended today, ER and return-to-clinic precautions discussed, patient verbalized understanding.     [1]  Allergies Allergen Reactions   Shellfish Allergy Anaphylaxis    Throat swelling     Christopher Savannah,  PA-C 07/09/24 1053  "

## 2024-07-12 ENCOUNTER — Other Ambulatory Visit: Payer: Self-pay

## 2024-07-12 ENCOUNTER — Emergency Department (HOSPITAL_COMMUNITY)
Admission: EM | Admit: 2024-07-12 | Discharge: 2024-07-12 | Disposition: A | Discharge status: Home / Self Care | Attending: Emergency Medicine | Admitting: Emergency Medicine

## 2024-07-12 ENCOUNTER — Encounter (HOSPITAL_COMMUNITY): Payer: Self-pay

## 2024-07-12 DIAGNOSIS — R0781 Pleurodynia: Secondary | ICD-10-CM | POA: Diagnosis not present

## 2024-07-12 DIAGNOSIS — O9A211 Injury, poisoning and certain other consequences of external causes complicating pregnancy, first trimester: Secondary | ICD-10-CM | POA: Insufficient documentation
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
## 2024-07-15 ENCOUNTER — Telehealth: Admitting: Physician Assistant

## 2024-07-15 DIAGNOSIS — R051 Acute cough: Secondary | ICD-10-CM

## 2024-07-15 NOTE — Progress Notes (Signed)
" °  Because your cough has persisted despite a recent antibiotic, I feel your condition warrants further evaluation and I recommend that you be seen in a face-to-face visit.   NOTE: There will be NO CHARGE for this E-Visit   If you are having a true medical emergency, please call 911.     For an urgent face to face visit, [facility] has multiple urgent care centers for your convenience.  Click the link below for the full list of locations and hours, walk-in wait times, appointment scheduling options and driving directions:  Urgent Care - [place], [place], [place], [place], [place], [place]  [place]     Your MyChart E-visit questionnaire answers were reviewed by a board certified advanced clinical practitioner to complete your personal care plan based on your specific symptoms.    Thank you for using e-Visits.    "

## 2024-07-16 ENCOUNTER — Inpatient Hospital Stay (HOSPITAL_COMMUNITY)

## 2024-07-16 ENCOUNTER — Ambulatory Visit: Admitting: Obstetrics

## 2024-07-16 ENCOUNTER — Inpatient Hospital Stay (HOSPITAL_COMMUNITY)
Admission: AD | Admit: 2024-07-16 | Discharge: 2024-07-16 | Disposition: A | Discharge status: Home / Self Care | Attending: Obstetrics and Gynecology | Admitting: Obstetrics and Gynecology

## 2024-07-16 ENCOUNTER — Encounter (HOSPITAL_COMMUNITY): Payer: Self-pay | Admitting: Obstetrics & Gynecology

## 2024-07-16 DIAGNOSIS — I10 Essential (primary) hypertension: Secondary | ICD-10-CM

## 2024-07-16 DIAGNOSIS — O10011 Pre-existing essential hypertension complicating pregnancy, first trimester: Secondary | ICD-10-CM | POA: Diagnosis not present

## 2024-07-16 DIAGNOSIS — Z3A01 Less than 8 weeks gestation of pregnancy: Secondary | ICD-10-CM | POA: Diagnosis not present

## 2024-07-16 DIAGNOSIS — O26851 Spotting complicating pregnancy, first trimester: Secondary | ICD-10-CM

## 2024-07-16 DIAGNOSIS — N939 Abnormal uterine and vaginal bleeding, unspecified: Secondary | ICD-10-CM

## 2024-07-16 DIAGNOSIS — O99341 Other mental disorders complicating pregnancy, first trimester: Secondary | ICD-10-CM | POA: Diagnosis not present

## 2024-07-16 DIAGNOSIS — F319 Bipolar disorder, unspecified: Secondary | ICD-10-CM | POA: Insufficient documentation

## 2024-07-16 LAB — CBC
HCT: 33.5 % — ABNORMAL LOW (ref 36.0–46.0)
Hemoglobin: 10.1 g/dL — ABNORMAL LOW (ref 12.0–15.0)
MCH: 21.8 pg — ABNORMAL LOW (ref 26.0–34.0)
MCHC: 30.1 g/dL (ref 30.0–36.0)
MCV: 72.2 fL — ABNORMAL LOW (ref 80.0–100.0)
Platelets: 253 K/uL (ref 150–400)
RBC: 4.64 MIL/uL (ref 3.87–5.11)
RDW: 19.9 % — ABNORMAL HIGH (ref 11.5–15.5)
WBC: 11.6 K/uL — ABNORMAL HIGH (ref 4.0–10.5)
nRBC: 0 % (ref 0.0–0.2)

## 2024-07-16 LAB — COMPREHENSIVE METABOLIC PANEL WITH GFR
ALT: 31 U/L (ref 0–44)
AST: 24 U/L (ref 15–41)
Albumin: 3.7 g/dL (ref 3.5–5.0)
Alkaline Phosphatase: 78 U/L (ref 38–126)
Anion gap: 10 (ref 5–15)
BUN: 14 mg/dL (ref 6–20)
CO2: 22 mmol/L (ref 22–32)
Calcium: 8.9 mg/dL (ref 8.9–10.3)
Chloride: 104 mmol/L (ref 98–111)
Creatinine, Ser: 0.83 mg/dL (ref 0.44–1.00)
GFR, Estimated: >60 mL/min
Glucose, Bld: 91 mg/dL (ref 70–99)
Potassium: 4 mmol/L (ref 3.5–5.1)
Sodium: 136 mmol/L (ref 135–145)
Total Bilirubin: 0.3 mg/dL (ref 0.0–1.2)
Total Protein: 6.7 g/dL (ref 6.5–8.1)

## 2024-07-16 LAB — URINALYSIS, ROUTINE W REFLEX MICROSCOPIC
Bilirubin Urine: NEGATIVE
Glucose, UA: NEGATIVE mg/dL
Hgb urine dipstick: NEGATIVE
Ketones, ur: NEGATIVE mg/dL
Nitrite: NEGATIVE
Protein, ur: 30 mg/dL — AB
Specific Gravity, Urine: 1.02 (ref 1.005–1.030)
WBC, UA: >50 WBC/hpf (ref 0–5)
pH: 5 (ref 5.0–8.0)

## 2024-07-16 LAB — ABO/RH
ABO/RH(D): O NEG
Antibody Screen: NEGATIVE

## 2024-07-16 LAB — HCG, QUANTITATIVE, PREGNANCY: hCG, Beta Chain, Quant, S: 4885 m[IU]/mL — ABNORMAL HIGH

## 2024-07-16 LAB — WET PREP, GENITAL
Clue Cells Wet Prep HPF POC: NONE SEEN
Sperm: NONE SEEN
Trich, Wet Prep: NONE SEEN
WBC, Wet Prep HPF POC: <10
Yeast Wet Prep HPF POC: NONE SEEN

## 2024-07-16 MED ORDER — NIFEDIPINE ER OSMOTIC RELEASE 30 MG PO TB24: 30.0000 mg | ORAL_TABLET | Freq: Every day | ORAL | 3 refills | Status: DC

## 2024-07-16 NOTE — MAU Note (Addendum)
 [name] [name] is a 38 y.o. at Unknown here in MAU reporting: started spotting 2 days ago only with wiping. Reports lower abdominal cramping and lower back pain. Worse with movement. Patient denies any unusual vaginal discharge, vaginal odor, itching or pain. Denies any recent sexual intercourse.  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  First OB appointment January 13th with [facility].  LMP: 04/22/24 unsure of dating  Onset of complaint: 2 days ago Pain score: back 6  abd 4 Vitals:   07/16/24 0806  BP: 131/85  Pulse: 99  Resp: 20  Temp: 98.1 F (36.7 C)  SpO2: 98%     FHT:n/a Lab orders placed from triage:  CUBA workup

## 2024-07-16 NOTE — MAU Provider Note (Signed)
 Chief Complaint:  Vaginal Bleeding   HPI   Event Date/Time   First Provider Initiated Contact with Patient 07/16/24 0949     [name] [name] is a 38 y.o. [id number] at [redacted]w[redacted]d who presents to maternity admissions reporting light spotting, fatigue and increased lower back pain with mild lower abdominal cramping since her positive pregnancy test. 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 No vaginal discharge, excessive bleeding or one-sided abdominal pain. No other physical complaints (including urinary symptoms).  Pregnancy Course: Receives care at [facility], has not established for this pregnancy yet.  Past Medical History:  Diagnosis Date   Abnormal Pap smear    Alpha thalassemia silent carrier 12/04/2021   FOB___   Anxiety    Anxiety and depression 02/01/2022   Asthma    Bipolar 1 disorder (HCC)    Borderline personality disorder (HCC)    Depression    GERD (gastroesophageal reflux disease)    IUD (intrauterine device) in place 04/12/2013   Had IUD inserted 03/29/13 could not feel strings went to ER 9/18 and KUB saw IUD still could not see string, can't see now but seen in US    Kidney infection    Morbid obesity (HCC)    Pregnancy    Pregnancy induced hypertension    PTSD (post-traumatic stress disorder)    UTI (urinary tract infection)    OB History  Gravida Para Term Preterm AB Living  6 3 2 1 2 3   SAB IAB Ectopic Multiple Live Births  2 0 0 0 3    # Outcome Date GA Lbr Len/2nd Weight Sex Type Anes PTL Lv  6 Current           5 Term 05/09/22 [redacted]w[redacted]d 09:05 / 00:25 7 lb 10.4 oz (3.47 kg) M Vag-Spont EPI  LIV     Birth Comments: WNL  4 Preterm 06/15/12 [redacted]w[redacted]d  5 lb 6 oz (2.438 kg) M CS-LTranv Spinal  LIV     Complications: Fetal Intolerance  3 Term 05/01/08 [redacted]w[redacted]d  10 lb 11 oz (4.848 kg) F Vag-Spont None N LIV  2 SAB              Birth Comments: System Generated. Please review and update pregnancy details.  1 SAB              Birth Comments: System Generated. Please review and  update pregnancy details.   Past Surgical History:  Procedure Laterality Date   CESAREAN SECTION     HYSTEROSCOPY N/A 01/09/2021   Procedure: HYSTEROSCOPY;  Surgeon: Marilynn Nest, DO;  Location: [facility];  Service: Gynecology;  Laterality: N/A;   IUD REMOVAL N/A 01/09/2021   Procedure: INTRAUTERINE DEVICE (IUD) REMOVAL;  Surgeon: Ozan, Jennifer, DO;  Location: [facility];  Service: Gynecology;  Laterality: N/A;   TOOTH EXTRACTION     Family History  Problem Relation Age of Onset   Diabetes Paternal Grandfather    Hypertension Paternal Grandmother    Diabetes Paternal Grandmother    Hypertension Father    Diabetes Father    Congestive Heart Failure Father    ADD / ADHD Son    Diabetes Paternal Aunt    Diabetes Paternal Uncle    Social History[1] Allergies[2] Medications Prior to Admission  Medication Sig Dispense Refill Last Dose/Taking   albuterol  (VENTOLIN  HFA) 108 (90 Base) MCG/ACT inhaler Inhale 2 puffs into the lungs every 6 (six) hours as needed for wheezing or shortness of breath. 8 g 0    ARIPiprazole (ABILIFY)  15 MG tablet Take 15 mg by mouth at bedtime.      azithromycin  (ZITHROMAX ) 250 MG tablet Day 1: take 2 tablets. Day 2-5: Take 1 tablet daily. 6 tablet 0    cetirizine  (ZYRTEC ) 10 MG tablet Take 1 tablet (10 mg total) by mouth daily. 90 tablet 0    Doxylamine -Pyridoxine  (DICLEGIS ) 10-10 MG TBEC Take 1 tablet by mouth 2 (two) times daily as needed. 60 tablet 0    fluticasone  (FLONASE ) 50 MCG/ACT nasal spray Place 2 sprays into both nostrils daily. 16 g 0    losartan (COZAAR) 50 MG tablet Take 50 mg by mouth daily.      pantoprazole  (PROTONIX ) 20 MG tablet Take 20 mg by mouth daily.      sertraline  (ZOLOFT ) 100 MG tablet Take 1 tablet (100 mg total) by mouth at bedtime. 30 tablet 0    SUMAtriptan  (IMITREX ) 100 MG tablet Take 1 tablet at earliest onset of headache.  May repeat in 2 hours if headache persists or recurs.  Maximum 2 tablets in 24 hours. 10 tablet 5    SYMBICORT  80-4.5 MCG/ACT inhaler Inhale 2 puffs into the lungs 2 (two) times daily.      topiramate  (TOPAMAX ) 50 MG tablet Take 1 tablet (50 mg total) by mouth at bedtime. 30 tablet 5    traZODone (DESYREL) 50 MG tablet Take 50 mg by mouth at bedtime as needed.      I have reviewed patient's Past Medical Hx, Surgical Hx, Family Hx, Social Hx, medications and allergies.   ROS  Pertinent items noted in HPI and remainder of comprehensive ROS otherwise negative.   PHYSICAL EXAM  Patient Vitals for the past 24 hrs:  BP Temp Temp src Pulse Resp SpO2 Height Weight  07/16/24 0806 131/85 98.1 F (36.7 C) Oral 99 20 98 % 5' 5 (1.651 m) (!) 421 lb 1.6 oz (191 kg)   Constitutional: Well-developed, well-nourished female in no acute distress.  Cardiovascular: normal rate & rhythm, warm and well-perfused Respiratory: normal effort, no problems with respiration noted GI: Abd soft, non-tender, non-distended MS: Extremities nontender, no edema, normal ROM Neurologic: Alert and oriented x 4.  GU: no CVA tenderness Pelvic: exam deferred, self-swabs collected   Labs: Results for orders placed or performed during the hospital encounter of 07/16/24 (from the past 24 hours)  Urinalysis, Routine w reflex microscopic -Urine, Clean Catch     Status: Abnormal   Collection Time: 07/16/24  8:20 AM  Result Value Ref Range   Color, Urine YELLOW YELLOW   APPearance HAZY (A) CLEAR   Specific Gravity, Urine 1.020 1.005 - 1.030   pH 5.0 5.0 - 8.0   Glucose, UA NEGATIVE NEGATIVE mg/dL   Hgb urine dipstick NEGATIVE NEGATIVE   Bilirubin Urine NEGATIVE NEGATIVE   Ketones, ur NEGATIVE NEGATIVE mg/dL   Protein, ur 30 (A) NEGATIVE mg/dL   Nitrite NEGATIVE NEGATIVE   Leukocytes,Ua MODERATE (A) NEGATIVE   RBC / HPF 0-5 0 - 5 RBC/hpf   WBC, UA >50 0 - 5 WBC/hpf   Bacteria, UA RARE (A) NONE SEEN   Squamous Epithelial / HPF 0-5 0 - 5 /HPF   Mucus PRESENT   Wet prep, genital     Status: None   Collection Time: 07/16/24  8:25 AM   Result Value Ref Range   Yeast Wet Prep HPF POC NONE SEEN NONE SEEN   Trich, Wet Prep NONE SEEN NONE SEEN   Clue Cells Wet Prep HPF POC NONE SEEN  NONE SEEN   WBC, Wet Prep HPF POC <10 <10   Sperm NONE SEEN   ABO/Rh     Status: None   Collection Time: 07/16/24  8:35 AM  Result Value Ref Range   ABO/RH(D) O NEG    Antibody Screen      NEG Performed at [facility], [address], [place], [place] [address]   CBC     Status: Abnormal   Collection Time: 07/16/24  8:40 AM  Result Value Ref Range   WBC 11.6 (H) 4.0 - 10.5 K/uL   RBC 4.64 3.87 - 5.11 MIL/uL   Hemoglobin 10.1 (L) 12.0 - 15.0 g/dL   HCT 66.4 (L) 63.9 - 53.9 %   MCV 72.2 (L) 80.0 - 100.0 fL   MCH 21.8 (L) 26.0 - 34.0 pg   MCHC 30.1 30.0 - 36.0 g/dL   RDW 80.0 (H) 88.4 - 84.4 %   Platelets 253 150 - 400 K/uL   nRBC 0.0 0.0 - 0.2 %  hCG, quantitative, pregnancy     Status: Abnormal   Collection Time: 07/16/24  8:40 AM  Result Value Ref Range   hCG, Beta Chain, Quant, S 4,885 (H) <5 mIU/mL  Comprehensive metabolic panel     Status: None   Collection Time: 07/16/24  8:40 AM  Result Value Ref Range   Sodium 136 135 - 145 mmol/L   Potassium 4.0 3.5 - 5.1 mmol/L   Chloride 104 98 - 111 mmol/L   CO2 22 22 - 32 mmol/L   Glucose, Bld 91 70 - 99 mg/dL   BUN 14 6 - 20 mg/dL   Creatinine, Ser 9.16 0.44 - 1.00 mg/dL   Calcium 8.9 8.9 - 89.6 mg/dL   Total Protein 6.7 6.5 - 8.1 g/dL   Albumin 3.7 3.5 - 5.0 g/dL   AST 24 15 - 41 U/L   ALT 31 0 - 44 U/L   Alkaline Phosphatase 78 38 - 126 U/L   Total Bilirubin 0.3 0.0 - 1.2 mg/dL   GFR, Estimated >39 >39 mL/min   Anion gap 10 5 - 15   Imaging:  US  OB LESS THAN 14 WEEKS WITH OB TRANSVAGINAL Result Date: 07/16/2024 EXAM: ULTRASOUND FIRST TRIMESTER TECHNIQUE: Transabdominal and Transvaginal first trimester obstetric pelvic duplex ultrasound was performed with real-time imaging, color flow Doppler imaging, and spectral analysis. COMPARISON: 09/13/2022. CLINICAL HISTORY:  [id number] Vaginal bleeding affecting early pregnancy FINDINGS: UTERUS: No focal myometrial mass. GESTATIONAL SAC(S): Small gestational sac in the upper endometrium, measuring 10 x 10 x 13 mm (mean sac diameter of 10.9 mm). No subchorionic hemorrhage. YOLK SAC: Present. EMBRYO(<11WK) /FETUS(>=11WK): No fetal pole or cardiac activity present, at this time. CROWN RUMP LENGTH: Not measured. RATE OF CARDIAC ACTIVITY: Not measured. RIGHT OVARY: Unremarkable. LEFT OVARY: Unremarkable. FREE FLUID: Trace free fluid in the pelvis, likely physiologic in a female this age. MEASUREMENTS ESTIMATED GESTATIONAL AGE BY CURRENT ULTRASOUND: 5 weeks and 6 days. IMPRESSION: 1. Single intrauterine gestation with a yolk sac present and an estimated gestational age of [redacted] weeks and 6 days. No fetal pole or fetal cardiac activity present, likely due to the earliness of the gestation. Repeat ultrasound in 14 days recommended to assess for progression in the pregnancy. Electronically signed by: Rogelia Myers MD 07/16/2024 09:34 AM EST RP Workstation: [id number]   MDM & MAU COURSE  MDM: High  MAU Course: Orders Placed This Encounter  Procedures   Wet prep, genital   Culture, OB Urine  US  OB LESS THAN 14 WEEKS WITH OB TRANSVAGINAL   Urinalysis, Routine w reflex microscopic -Urine, Clean Catch   CBC   hCG, quantitative, pregnancy   Comprehensive metabolic panel   ABO/Rh   Discharge patient   Meds ordered this encounter  Medications   NIFEdipine  (PROCARDIA -XL/NIFEDICAL-XL) 30 MG 24 hr tablet    Sig: Take 1 tablet (30 mg total) by mouth daily.    Dispense:  60 tablet    Refill:  3   Workup ordered from triage due to patient stability, self-swabs collected.  Discussed results with patient and advised viability scan in two weeks at [facility], pt agreed to plan. Will send message to office for scheduling assistance.  Unsure of etiology for light spotting, advised GC will be back in a couple of days and she will be notified of  any positive result. OB urine culture added to lab panel.  Advised we switch her losartan to nifedipine  (which she has taken before without issue), and advised she stay on her abilify (bipolar symptoms currently stable). She already stopped taking topiramate .   ASSESSMENT   1. [redacted] weeks gestation of pregnancy   2. Vaginal spotting   3. Chronic hypertension   4. Bipolar disorder without psychotic features [facility])    PLAN  Discharge home in stable condition with return precautions.     Follow-up Information     [facility] Follow up in 2 week(s).   Why: For repeat ultrasound Contact information: [address] [place]  [address] [phone]                Allergies as of 07/16/2024       Reactions   Shellfish Allergy Anaphylaxis   Throat swelling        Medication List     STOP taking these medications    azithromycin  250 MG tablet Commonly known as: ZITHROMAX    losartan 50 MG tablet Commonly known as: COZAAR   sertraline  100 MG tablet Commonly known as: ZOLOFT    SUMAtriptan  100 MG tablet Commonly known as: IMITREX    topiramate  50 MG tablet Commonly known as: Topamax    traZODone 50 MG tablet Commonly known as: DESYREL       TAKE these medications    albuterol  108 (90 Base) MCG/ACT inhaler Commonly known as: VENTOLIN  HFA Inhale 2 puffs into the lungs every 6 (six) hours as needed for wheezing or shortness of breath.   ARIPiprazole 15 MG tablet Commonly known as: ABILIFY Take 15 mg by mouth at bedtime.   cetirizine  10 MG tablet Commonly known as: ZYRTEC  Take 1 tablet (10 mg total) by mouth daily.   Doxylamine -Pyridoxine  10-10 MG Tbec Commonly known as: Diclegis  Take 1 tablet by mouth 2 (two) times daily as needed.   fluticasone  50 MCG/ACT nasal spray Commonly known as: FLONASE  Place 2 sprays into both nostrils daily.   NIFEdipine  30 MG 24 hr tablet Commonly known as:  PROCARDIA -XL/NIFEDICAL-XL Take 1 tablet (30 mg total) by mouth daily.   pantoprazole  20 MG tablet Commonly known as: PROTONIX  Take 20 mg by mouth daily.   Symbicort 80-4.5 MCG/ACT inhaler Generic drug: budesonide-formoterol Inhale 2 puffs into the lungs 2 (two) times daily.       Cornell Finder, CNM, MSN, IBCLC Certified Nurse Midwife, [facility]        [1]  Social History Tobacco Use   Smoking status: Former    Current packs/day: 0.00    Types: Cigarettes  Quit date: 05/21/2007    Years since quitting: 17.1   Smokeless tobacco: Never  Vaping Use   Vaping status: Former  Substance Use Topics   Alcohol use: Not Currently   Drug use: Not Currently    Types: Marijuana  [2]  Allergies Allergen Reactions   Shellfish Allergy Anaphylaxis    Throat swelling

## 2024-07-18 LAB — CULTURE, OB URINE: Culture: >=100000 — AB

## 2024-07-19 ENCOUNTER — Ambulatory Visit: Payer: Self-pay | Admitting: Certified Nurse Midwife

## 2024-07-19 DIAGNOSIS — O2341 Unspecified infection of urinary tract in pregnancy, first trimester: Secondary | ICD-10-CM

## 2024-07-19 LAB — GC/CHLAMYDIA PROBE AMP (~~LOC~~) NOT AT ARMC
Chlamydia: NEGATIVE
Comment: NEGATIVE
Comment: NORMAL
Neisseria Gonorrhea: NEGATIVE

## 2024-07-19 MED ORDER — CEFADROXIL 500 MG PO CAPS: 500.0000 mg | ORAL_CAPSULE | Freq: Two times a day (BID) | ORAL | 0 refills | Status: AC

## 2024-07-20 ENCOUNTER — Encounter: Payer: Self-pay | Admitting: Nurse Practitioner

## 2024-07-20 ENCOUNTER — Other Ambulatory Visit: Payer: Self-pay | Admitting: Nurse Practitioner

## 2024-07-23 ENCOUNTER — Encounter (HOSPITAL_BASED_OUTPATIENT_CLINIC_OR_DEPARTMENT_OTHER): Payer: Self-pay | Admitting: Pulmonary Disease

## 2024-07-23 ENCOUNTER — Ambulatory Visit: Admitting: Internal Medicine

## 2024-07-23 ENCOUNTER — Encounter (HOSPITAL_BASED_OUTPATIENT_CLINIC_OR_DEPARTMENT_OTHER): Payer: Self-pay

## 2024-07-23 ENCOUNTER — Ambulatory Visit (HOSPITAL_BASED_OUTPATIENT_CLINIC_OR_DEPARTMENT_OTHER): Admitting: Pulmonary Disease

## 2024-08-03 ENCOUNTER — Ambulatory Visit (INDEPENDENT_AMBULATORY_CARE_PROVIDER_SITE_OTHER): Payer: Self-pay | Admitting: *Deleted

## 2024-08-03 ENCOUNTER — Other Ambulatory Visit (HOSPITAL_COMMUNITY)
Admission: RE | Admit: 2024-08-03 | Discharge: 2024-08-03 | Disposition: A | Discharge status: Home / Self Care | Source: Ambulatory Visit | Attending: Obstetrics & Gynecology | Admitting: Obstetrics & Gynecology

## 2024-08-03 ENCOUNTER — Other Ambulatory Visit: Payer: Self-pay

## 2024-08-03 VITALS — BP 118/79 | HR 91 | Wt >= 6400 oz

## 2024-08-03 DIAGNOSIS — O0990 Supervision of high risk pregnancy, unspecified, unspecified trimester: Secondary | ICD-10-CM | POA: Diagnosis present

## 2024-08-03 DIAGNOSIS — O3680X0 Pregnancy with inconclusive fetal viability, not applicable or unspecified: Secondary | ICD-10-CM

## 2024-08-03 DIAGNOSIS — O0991 Supervision of high risk pregnancy, unspecified, first trimester: Secondary | ICD-10-CM | POA: Diagnosis not present

## 2024-08-03 DIAGNOSIS — Z23 Encounter for immunization: Secondary | ICD-10-CM

## 2024-08-03 DIAGNOSIS — Z3A08 8 weeks gestation of pregnancy: Secondary | ICD-10-CM

## 2024-08-03 MED ORDER — BLOOD PRESSURE KIT DEVI: 1.0000 | 0 refills | Status: AC

## 2024-08-03 NOTE — Progress Notes (Unsigned)
 New OB Intake  I connected with [name] [name]  on 08/03/2024 at 10:15 AM EST by {Contact:24193} Video Visit and verified that I am speaking with the correct person using two identifiers. Nurse is located at [facility] and pt is located at ***.  I discussed the limitations, risks, security and privacy concerns of performing an evaluation and management service by telephone and the availability of in person appointments. I also discussed with the patient that there may be a patient responsible charge related to this service. The patient expressed understanding and agreed to proceed.  I explained I am completing New OB Intake today. We discussed EDD of *** based on {EDD:33166}. Pt is [id number]. I reviewed her allergies, medications and Medical/Surgical/OB history.    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. Discussed the nature of our practice with multiple providers including residents and students as well as female and female providers. Due to  the size of the practice, the delivering provider may not be the same as those providing prenatal care.   Patient {Is/is not:9024} interested in water birth.  MyChart/Babyscripts MyChart access verified. I explained pt will have some visits in office and some virtually. Babyscripts instructions given and order placed. Patient verifies receipt of registration text/e-mail. Account successfully created and app downloaded. If patient is a candidate for Optimized scheduling, add to sticky note.   Blood Pressure Cuff/Weight Scale {blood pressure cuff:24241} Explained after first prenatal appt pt will check weekly and document in Babyscripts. Patient {weight scale:28336}.  Anatomy US  Explained first scheduled US  will be around 19 weeks. Anatomy US  scheduled for TBD at TBD.  Is patient a CenteringPregnancy candidate?  {Accepted:19197::Accepted,Not a Candidate,Declined} Declined due to {Declined:19197::Schedule,Childcare,Group setting,Support person concern,Declined to say,Enrolled in [facility],***} Not a candidate due to {Not a Candidate:19197::DM,CHTN, medication controlled,Language barrier,>28 weeks,Multiple gestation (mono-mono or mono-di),Complex coordination of care needed,***} If accepted,    Is patient a Mom+Baby Combined Care candidate?  {Accepted:19197::Accepted,Declined,Not a candidate,***}   If accepted, confirm patient does not intend to move from the area for at least 12 months, then notify Mom+Baby staff  Is patient a candidate for Babyscripts Optimization? {babyscripts:31704}   First visit review I reviewed new OB appt with patient. Explained pt will be seen by Dr.Constant at first visit. Discussed [place] genetic screening with patient. *** Panorama and Horizon.. Routine prenatal labs {collected today/needed at new OB visit:9024}   Last Pap Diagnosis  Date Value Ref Range Status  12/26/2020   Final   - Negative for intraepithelial lesion  or malignancy (NILM)    Rocky CHRISTELLA Ober, RN  08/03/2024  5:55 PM

## 2024-08-05 ENCOUNTER — Ambulatory Visit (HOSPITAL_BASED_OUTPATIENT_CLINIC_OR_DEPARTMENT_OTHER): Admitting: Pulmonary Disease

## 2024-08-05 LAB — CERVICOVAGINAL ANCILLARY ONLY
Chlamydia: NEGATIVE
Comment: NEGATIVE
Comment: NORMAL
Neisseria Gonorrhea: NEGATIVE

## 2024-08-06 ENCOUNTER — Ambulatory Visit: Payer: Self-pay | Admitting: Obstetrics & Gynecology

## 2024-08-06 LAB — CULTURE, OB URINE

## 2024-08-06 LAB — URINE CULTURE, OB REFLEX

## 2024-08-09 ENCOUNTER — Ambulatory Visit (HOSPITAL_BASED_OUTPATIENT_CLINIC_OR_DEPARTMENT_OTHER): Admitting: Pulmonary Disease

## 2024-08-09 ENCOUNTER — Encounter: Payer: Self-pay | Admitting: Obstetrics and Gynecology

## 2024-08-12 ENCOUNTER — Other Ambulatory Visit: Payer: Self-pay

## 2024-08-12 ENCOUNTER — Encounter (HOSPITAL_COMMUNITY): Payer: Self-pay | Admitting: Obstetrics & Gynecology

## 2024-08-12 ENCOUNTER — Inpatient Hospital Stay (HOSPITAL_COMMUNITY)
Admission: AD | Admit: 2024-08-12 | Discharge: 2024-08-12 | Disposition: A | Discharge status: Home / Self Care | Attending: Obstetrics and Gynecology | Admitting: Obstetrics and Gynecology

## 2024-08-12 ENCOUNTER — Other Ambulatory Visit: Payer: Self-pay | Admitting: Obstetrics and Gynecology

## 2024-08-12 ENCOUNTER — Inpatient Hospital Stay (HOSPITAL_COMMUNITY)

## 2024-08-12 ENCOUNTER — Ambulatory Visit (INDEPENDENT_AMBULATORY_CARE_PROVIDER_SITE_OTHER): Payer: Self-pay

## 2024-08-12 VITALS — BP 125/85 | HR 92

## 2024-08-12 DIAGNOSIS — O99211 Obesity complicating pregnancy, first trimester: Secondary | ICD-10-CM | POA: Diagnosis not present

## 2024-08-12 DIAGNOSIS — O09521 Supervision of elderly multigravida, first trimester: Secondary | ICD-10-CM | POA: Insufficient documentation

## 2024-08-12 DIAGNOSIS — R079 Chest pain, unspecified: Secondary | ICD-10-CM | POA: Diagnosis not present

## 2024-08-12 DIAGNOSIS — O09299 Supervision of pregnancy with other poor reproductive or obstetric history, unspecified trimester: Secondary | ICD-10-CM

## 2024-08-12 DIAGNOSIS — K219 Gastro-esophageal reflux disease without esophagitis: Secondary | ICD-10-CM | POA: Diagnosis not present

## 2024-08-12 DIAGNOSIS — Z79899 Other long term (current) drug therapy: Secondary | ICD-10-CM | POA: Diagnosis not present

## 2024-08-12 DIAGNOSIS — O0990 Supervision of high risk pregnancy, unspecified, unspecified trimester: Secondary | ICD-10-CM

## 2024-08-12 DIAGNOSIS — R55 Syncope and collapse: Secondary | ICD-10-CM | POA: Diagnosis not present

## 2024-08-12 DIAGNOSIS — O99611 Diseases of the digestive system complicating pregnancy, first trimester: Secondary | ICD-10-CM | POA: Diagnosis not present

## 2024-08-12 DIAGNOSIS — R0789 Other chest pain: Secondary | ICD-10-CM

## 2024-08-12 DIAGNOSIS — Z3A09 9 weeks gestation of pregnancy: Secondary | ICD-10-CM | POA: Diagnosis not present

## 2024-08-12 DIAGNOSIS — O26891 Other specified pregnancy related conditions, first trimester: Secondary | ICD-10-CM | POA: Diagnosis not present

## 2024-08-12 DIAGNOSIS — R0989 Other specified symptoms and signs involving the circulatory and respiratory systems: Secondary | ICD-10-CM | POA: Diagnosis present

## 2024-08-12 DIAGNOSIS — O99891 Other specified diseases and conditions complicating pregnancy: Secondary | ICD-10-CM | POA: Insufficient documentation

## 2024-08-12 DIAGNOSIS — Z0130 Encounter for examination of blood pressure without abnormal findings: Secondary | ICD-10-CM

## 2024-08-12 DIAGNOSIS — Z98891 History of uterine scar from previous surgery: Secondary | ICD-10-CM

## 2024-08-12 DIAGNOSIS — O99210 Obesity complicating pregnancy, unspecified trimester: Secondary | ICD-10-CM | POA: Insufficient documentation

## 2024-08-12 LAB — CBC WITH DIFFERENTIAL/PLATELET
Abs Immature Granulocytes: 0.05 K/uL (ref 0.00–0.07)
Basophils Absolute: 0.1 K/uL (ref 0.0–0.1)
Basophils Relative: 1 %
Eosinophils Absolute: 0.1 K/uL (ref 0.0–0.5)
Eosinophils Relative: 1 %
HCT: 35.3 % — ABNORMAL LOW (ref 36.0–46.0)
Hemoglobin: 10.6 g/dL — ABNORMAL LOW (ref 12.0–15.0)
Immature Granulocytes: 0 %
Lymphocytes Relative: 17 %
Lymphs Abs: 1.9 K/uL (ref 0.7–4.0)
MCH: 21.6 pg — ABNORMAL LOW (ref 26.0–34.0)
MCHC: 30 g/dL (ref 30.0–36.0)
MCV: 72 fL — ABNORMAL LOW (ref 80.0–100.0)
Monocytes Absolute: 0.7 K/uL (ref 0.1–1.0)
Monocytes Relative: 6 %
Neutro Abs: 8.6 K/uL — ABNORMAL HIGH (ref 1.7–7.7)
Neutrophils Relative %: 75 %
Platelets: 270 K/uL (ref 150–400)
RBC: 4.9 MIL/uL (ref 3.87–5.11)
RDW: 19.5 % — ABNORMAL HIGH (ref 11.5–15.5)
WBC: 11.4 K/uL — ABNORMAL HIGH (ref 4.0–10.5)
nRBC: 0 % (ref 0.0–0.2)

## 2024-08-12 LAB — COMPREHENSIVE METABOLIC PANEL WITH GFR
ALT: 16 U/L (ref 0–44)
AST: 19 U/L (ref 15–41)
Albumin: 3.6 g/dL (ref 3.5–5.0)
Alkaline Phosphatase: 80 U/L (ref 38–126)
Anion gap: 11 (ref 5–15)
BUN: 11 mg/dL (ref 6–20)
CO2: 25 mmol/L (ref 22–32)
Calcium: 9 mg/dL (ref 8.9–10.3)
Chloride: 101 mmol/L (ref 98–111)
Creatinine, Ser: 0.82 mg/dL (ref 0.44–1.00)
GFR, Estimated: >60 mL/min
Glucose, Bld: 106 mg/dL — ABNORMAL HIGH (ref 70–99)
Potassium: 3.7 mmol/L (ref 3.5–5.1)
Sodium: 137 mmol/L (ref 135–145)
Total Bilirubin: 0.2 mg/dL (ref 0.0–1.2)
Total Protein: 6.8 g/dL (ref 6.5–8.1)

## 2024-08-12 LAB — PRO BRAIN NATRIURETIC PEPTIDE: Pro Brain Natriuretic Peptide: <50 pg/mL

## 2024-08-12 LAB — TROPONIN T, HIGH SENSITIVITY: Troponin T High Sensitivity: <6 ng/L (ref 0–19)

## 2024-08-12 LAB — D-DIMER, QUANTITATIVE: D-Dimer, Quant: 0.49 ug{FEU}/mL (ref 0.00–0.50)

## 2024-08-12 LAB — LIPASE, BLOOD: Lipase: 17 U/L (ref 11–51)

## 2024-08-12 MED ORDER — FUROSEMIDE 20 MG PO TABS
20.0000 mg | ORAL_TABLET | Freq: Once | ORAL | Status: AC
  Administered 2024-08-12: 20 mg via ORAL
  Filled 2024-08-12: qty 1

## 2024-08-12 MED ORDER — ALUM & MAG HYDROXIDE-SIMETH 200-200-20 MG/5ML PO SUSP
30.0000 mL | Freq: Once | ORAL | Status: AC
  Administered 2024-08-12: 30 mL via ORAL
  Filled 2024-08-12: qty 30

## 2024-08-12 MED ORDER — PANTOPRAZOLE SODIUM 40 MG PO TBEC
40.0000 mg | DELAYED_RELEASE_TABLET | Freq: Once | ORAL | Status: AC
  Administered 2024-08-12: 40 mg via ORAL
  Filled 2024-08-12: qty 1

## 2024-08-12 MED ORDER — PANTOPRAZOLE SODIUM 40 MG PO TBEC: 40.0000 mg | DELAYED_RELEASE_TABLET | Freq: Every day | ORAL | 8 refills | Status: AC

## 2024-08-12 MED ORDER — LIDOCAINE VISCOUS HCL 2 % MT SOLN
15.0000 mL | Freq: Once | OROMUCOSAL | Status: AC
  Administered 2024-08-12: 15 mL via ORAL
  Filled 2024-08-12: qty 15

## 2024-08-12 NOTE — MAU Note (Signed)
 [name] [name] is a 39 y.o. at [redacted]w[redacted]d here in MAU reporting: increase in chest pain and 1 episode of passing out  today while sititng on the bed- witnessed noted just looked to be sleeping- did not hit head.  Denies VB reports some increase Vag discharge LMP: - Onset of complaint: 1800 Pain score: 5/10 Vitals:   08/12/24 1834  BP: 137/85  Pulse: 93  Resp: 20  Temp: 98.5 F (36.9 C)  SpO2: 98%     FHT: na  Lab orders placed from triage: ekg

## 2024-08-12 NOTE — Progress Notes (Signed)
..  Subjective:  [name] [name] is a 39 y.o. pregnant female here for BP check. Patient reports that she has been taking Nifedipine  as prescribed and she had elevated BPs at home. Pt reports intermittent chest pain. Pt brought home cuff to office today and home cuff readings were 152/81, and 153/95, while office cuff was 125/85.    Hypertension ROS: taking medications as instructed, no medication side effects noted, no TIA's, noting some chest pains described as occasional tightness, no dyspnea on exertion, and no swelling of ankles.    Objective:  BP 125/85   Pulse 92   LMP 03/01/2024 (Approximate)   Appearance alert, well appearing, and in no distress. General exam BP noted to be well controlled today in office.    Assessment:   Blood Pressure well controlled.   Plan:  Orders and follow up as documented in patient record.. Consulted with Dr. Alger in office and advised pt that if having chest pain she can be evaluated at MAU. Advised pt that BP cuff from home is too small and not giving accurate readings and to get a large size cuff for home, pt voiced understanding. Provider placed referral for [facility]

## 2024-08-12 NOTE — Discharge Instructions (Addendum)
 Ms. [name] [name],  It was a pleasure seeing you again in MAU. I'm glad we were able to help you feel a little better.   Magnesium in pregnancy  Magnesium is a natural electrolyte we find in our foods and beverages. It is beneficial in pregnancy for multiple complaints.  It can help with headaches, rest, muscle aches/spasm, and bowel movements.  You may take Magnesium Glycinate or Magnesium Oxide (available at drug stores and online). We recommend starting with one tablet and working up to two. Take Magnesium nightly at bedtime to promote rest.   Thank you for trusting us  to care for you, Camie, Midwife

## 2024-08-12 NOTE — MAU Provider Note (Addendum)
 [facility] Attending MAU Note  Chief Complaint: Chest Pain and Loss of Consciousness    Event Date/Time   First Provider Initiated Contact with Patient 08/12/24 1958      SUBJECTIVE [name] [name] is a 39 y.o. [id number] at [redacted]w[redacted]d by LMP who presents with chest pain. Notes x 1 week, worsened today. Left sided and feels like a squeezing pain. Also, noted to have 5-6 minute syncopal episode today. Denies diaphoresis, nausea, fever. Some associated SOB.notes it is worse with exertion.  Past Medical History:  Diagnosis Date   Abnormal Pap smear    Alpha thalassemia silent carrier 12/04/2021   FOB___   Anxiety    Anxiety and depression 02/01/2022   Asthma    Bipolar 1 disorder (HCC)    Borderline personality disorder (HCC)    Depression    GERD (gastroesophageal reflux disease)    IUD (intrauterine device) in place 04/12/2013   Had IUD inserted 03/29/13 could not feel strings went to ER 9/18 and KUB saw IUD still could not see string, can't see now but seen in US    Kidney infection    Morbid obesity (HCC)    Pregnancy    Pregnancy induced hypertension    PTSD (post-traumatic stress disorder)    UTI (urinary tract infection)    OB History  Gravida Para Term Preterm AB Living  6 3 2 1 2 3   SAB IAB Ectopic Multiple Live Births  2 0 0 0 3    # Outcome Date GA Lbr Len/2nd Weight Sex Type Anes PTL Lv  6 Current           5 Term 05/09/22 [redacted]w[redacted]d 09:05 / 00:25 3470 g M Vag-Spont EPI  LIV     Birth Comments: WNL  4 Preterm 06/15/12 [redacted]w[redacted]d  2438 g M CS-LTranv Spinal  LIV     Complications: Fetal Intolerance  3 Term 05/01/08 [redacted]w[redacted]d  4848 g F Vag-Spont None N LIV  2 SAB              Birth Comments: System Generated. Please review and update pregnancy details.  1 SAB              Birth Comments: System Generated. Please review and update pregnancy details.   Past Surgical History:  Procedure Laterality Date   CESAREAN SECTION     HYSTEROSCOPY N/A 01/09/2021   Procedure:  HYSTEROSCOPY;  Surgeon: Marilynn Nest, DO;  Location: [facility];  Service: Gynecology;  Laterality: N/A;   IUD REMOVAL N/A 01/09/2021   Procedure: INTRAUTERINE DEVICE (IUD) REMOVAL;  Surgeon: Ozan, Jennifer, DO;  Location: [facility];  Service: Gynecology;  Laterality: N/A;   TOOTH EXTRACTION     Social History   Socioeconomic History   Marital status: Significant Other    Spouse name: Not on file   Number of children: 2   Years of education: Not on file   Highest education level: Not on file  Occupational History   Not on file  Tobacco Use   Smoking status: Former    Current packs/day: 0.00    Types: Cigarettes    Quit date: 05/21/2007    Years since quitting: 17.2   Smokeless tobacco: Never  Vaping Use   Vaping status: Former  Substance and Sexual Activity   Alcohol use: Not Currently   Drug use: Not Currently    Types: Marijuana   Sexual activity: Yes    Partners: Male  Other Topics Concern   Not on file  Social History Narrative   Not on file   Social Drivers of Health   Tobacco Use: Medium Risk (08/12/2024)   Patient History    Smoking Tobacco Use: Former    Smokeless Tobacco Use: Never    Passive Exposure: Not on file  Financial Resource Strain: Medium Risk (08/29/2022)   Overall Financial Resource Strain (CARDIA)    Difficulty of Paying Living Expenses: Somewhat hard  Food Insecurity: No Food Insecurity (08/29/2022)   Hunger Vital Sign    Worried About Running Out of Food in the Last Year: Never true    Ran Out of Food in the Last Year: Never true  Transportation Needs: No Transportation Needs (08/29/2022)   PRAPARE - Administrator, Civil Service (Medical): No    Lack of Transportation (Non-Medical): No  Physical Activity: Insufficiently Active (08/29/2022)   Exercise Vital Sign    Days of Exercise per Week: 3 days    Minutes of Exercise per Session: 40 min  Stress: Stress Concern Present (08/29/2022)   [employer] of Occupational Health - Occupational  Stress Questionnaire    Feeling of Stress : To some extent  Social Connections: Socially Isolated (08/29/2022)   Social Connection and Isolation Panel    Frequency of Communication with Friends and Family: Once a week    Frequency of Social Gatherings with Friends and Family: Never    Attends Religious Services: Never    Database Administrator or Organizations: No    Attends Banker Meetings: Never    Marital Status: Divorced  Catering Manager Violence: Not At Risk (08/29/2022)   Humiliation, Afraid, Rape, and Kick questionnaire    Fear of Current or Ex-Partner: No    Emotionally Abused: No    Physically Abused: No    Sexually Abused: No  Depression (PHQ2-9): Low Risk (08/03/2024)   Depression (PHQ2-9)    PHQ-2 Score: 0  Alcohol Screen: Low Risk (08/29/2022)   Alcohol Screen    Last Alcohol Screening Score (AUDIT): 2  Housing: Low Risk (08/29/2022)   Housing    Last Housing Risk Score: 0  Utilities: Not At Risk (08/29/2022)   AHC Utilities    Threatened with loss of utilities: No  Health Literacy: Not on file   Medications Ordered Prior to Encounter[1] Allergies[2]  ROS: Pertinent items in HPI  OBJECTIVE BP 137/85 (BP Location: Right Arm)   Pulse 93   Temp 98.5 F (36.9 C) (Oral)   Resp 20   Wt (!) 188.3 kg   LMP 03/01/2024 (Approximate)   SpO2 99%   BMI 69.09 kg/m  CONSTITUTIONAL: Well-developed, well-nourished female in no acute distress.  HENT:  Normocephalic, atraumatic EYES: Conjunctivae and EOM are normal. No scleral icterus.  NECK: Normal range of motion, supple, no masses.  Normal thyroid.  SKIN: Skin is warm and dry. No rash noted. Not diaphoretic. No erythema. No pallor. NEUROLGIC: Alert and oriented to person, place, and time. CARDIOVASCULAR: Normal heart rate noted RESPIRATORY: Effort normal, no problems with respiration noted. ABDOMEN: Soft, normal bowel sounds, no distention noted.  No tenderness, rebound or guarding.  MUSCULOSKELETAL: Normal  range of motion. No edema  LAB RESULTS Results for orders placed or performed during the hospital encounter of 08/12/24 (from the past 48 hours)  CBC with Differential/Platelet     Status: Abnormal   Collection Time: 08/12/24  7:29 PM  Result Value Ref Range   WBC 11.4 (H) 4.0 - 10.5 K/uL   RBC 4.90 3.87 - 5.11  MIL/uL   Hemoglobin 10.6 (L) 12.0 - 15.0 g/dL   HCT 64.6 (L) 63.9 - 53.9 %   MCV 72.0 (L) 80.0 - 100.0 fL   MCH 21.6 (L) 26.0 - 34.0 pg   MCHC 30.0 30.0 - 36.0 g/dL   RDW 80.4 (H) 88.4 - 84.4 %   Platelets 270 150 - 400 K/uL   nRBC 0.0 0.0 - 0.2 %   Neutrophils Relative % 75 %   Neutro Abs 8.6 (H) 1.7 - 7.7 K/uL   Lymphocytes Relative 17 %   Lymphs Abs 1.9 0.7 - 4.0 K/uL   Monocytes Relative 6 %   Monocytes Absolute 0.7 0.1 - 1.0 K/uL   Eosinophils Relative 1 %   Eosinophils Absolute 0.1 0.0 - 0.5 K/uL   Basophils Relative 1 %   Basophils Absolute 0.1 0.0 - 0.1 K/uL   Immature Granulocytes 0 %   Abs Immature Granulocytes 0.05 0.00 - 0.07 K/uL    Comment: Performed at [facility], [address], [place], [place] [address]  Comprehensive metabolic panel     Status: Abnormal   Collection Time: 08/12/24  7:29 PM  Result Value Ref Range   Sodium 137 135 - 145 mmol/L   Potassium 3.7 3.5 - 5.1 mmol/L   Chloride 101 98 - 111 mmol/L   CO2 25 22 - 32 mmol/L   Glucose, Bld 106 (H) 70 - 99 mg/dL    Comment: Glucose reference range applies only to samples taken after fasting for at least 8 hours.   BUN 11 6 - 20 mg/dL   Creatinine, Ser 9.17 0.44 - 1.00 mg/dL   Calcium 9.0 8.9 - 89.6 mg/dL   Total Protein 6.8 6.5 - 8.1 g/dL   Albumin 3.6 3.5 - 5.0 g/dL   AST 19 15 - 41 U/L   ALT 16 0 - 44 U/L   Alkaline Phosphatase 80 38 - 126 U/L   Total Bilirubin 0.2 0.0 - 1.2 mg/dL   GFR, Estimated >39 >39 mL/min    Comment: (NOTE) Calculated using the CKD-EPI Creatinine Equation (2021)    Anion gap 11 5 - 15    Comment: Performed at [facility], [address]  [place], [place] [address]  Troponin T, High Sensitivity     Status: None   Collection Time: 08/12/24  7:29 PM  Result Value Ref Range   Troponin T High Sensitivity <6 0 - 19 ng/L    Comment: (NOTE) Biotin concentrations > 1000 ng/mL falsely decrease TnT results.  Serial cardiac troponin measurements are suggested.  Refer to the Links section for chest pain algorithms and additional  guidance. Performed at [facility], [address] [place], [place] [address]   D-dimer, quantitative     Status: None   Collection Time: 08/12/24  7:29 PM  Result Value Ref Range   D-Dimer, Quant 0.49 0.00 - 0.50 ug/mL-FEU    Comment: (NOTE) At the manufacturer cut-off value of 0.5 g/mL FEU, this assay has a negative predictive value of 95-100%.This assay is intended for use in conjunction with a clinical pretest probability (PTP) assessment model to exclude pulmonary embolism (PE) and deep venous thrombosis (DVT) in outpatients suspected of PE or DVT. Results should be correlated with clinical presentation. Performed at [facility], [address] [place], [place] [address]     IMAGING DG Chest Port 1V same Day Result Date: 08/12/2024 EXAM: 1 VIEW(S) XRAY OF THE CHEST 08/12/2024 07:43:00 PM  COMPARISON: 10/11/2022. CLINICAL HISTORY: Shortness of breath. FINDINGS: LUNGS AND PLEURA: Mild vascular congestion. No pleural effusion. No pneumothorax. HEART AND MEDIASTINUM: No acute abnormality of the cardiac and mediastinal silhouettes. BONES AND SOFT TISSUES: No acute osseous abnormality. IMPRESSION: 1. Mild vascular congestion. Electronically signed by: Franky Crease MD 08/12/2024 07:55 PM EST RP Workstation: [id number]   US  OB Limited Result Date: 08/11/2024 ----------------------------------------------------------------------  OBSTETRICS REPORT                       (Signed Final 08/11/2024 01:49 pm) ---------------------------------------------------------------------- Patient Info  ID #:        [id number]                          D.O.B.:  August 05, 1985 (38 yrs)(F)  Name:       [name] [name]                 Visit Date: 08/03/2024 11:57 am ---------------------------------------------------------------------- Performed By  Attending:        Lynwood Solomons MD        Ref. Address:     [address]                                                             [place], [place]                                                             [address]  Performed By:     Rocky Ober RN        [facility]         [facility]                                                             [facility]  Referred By:      [facility] ---------------------------------------------------------------------- Orders  #  Description                           Code        Ordered By  1  US  OB LIMITED                         23184.9     LYNWOOD SOLOMONS ----------------------------------------------------------------------  #  Order #                     Accession #                Episode #  1  [id number]                   [id number]                 [id number] ---------------------------------------------------------------------- Indications  [redacted] weeks gestation of pregnancy                 Z3A.08 ---------------------------------------------------------------------- Fetal Evaluation  Num Of Fetuses:         1  Preg. Location:         Intrauterine  Gest. Sac:              Intrauterine  Yolk Sac:               Not visualized  Fetal Pole:             Visualized  Fetal Heart Rate(bpm):  159  Cardiac Activity:       Observed ---------------------------------------------------------------------- Biometry  CRL:        18  mm     G. Age:  8w 1d                   EDD:   03/14/25 ---------------------------------------------------------------------- OB History   Gravidity:    6         Term:   2        Prem:   1        SAB:   2  TOP:          0       Ectopic:  0        Living: 3 ---------------------------------------------------------------------- Gestational Age  LMP:           22w 1d        Date:  03/01/24                  EDD:   12/06/24  Best:          8w 1d      Det. By:  Early Ultrasound         EDD:   03/14/25                                      (08/03/24) ---------------------------------------------------------------------- Comments  Single live IUP at [redacted]w[redacted]d by CRL. EDD of 03/14/25 to be  assigned based on today's scan. ---------------------------------------------------------------------- Impression  Viable IUP [redacted]w[redacted]d by CRL ---------------------------------------------------------------------- Recommendations  Fetal anatomy scan at 18-20 weeks. ----------------------------------------------------------------------                 Lynwood Solomons, MD Electronically Signed Final Report   08/11/2024 01:49  pm ----------------------------------------------------------------------   US  OB LESS THAN 14 WEEKS WITH OB TRANSVAGINAL Result Date: 07/16/2024 EXAM: ULTRASOUND FIRST TRIMESTER TECHNIQUE: Transabdominal and Transvaginal first trimester obstetric pelvic duplex ultrasound was performed with real-time imaging, color flow Doppler imaging, and spectral analysis. COMPARISON: 09/13/2022. CLINICAL HISTORY: [id number] Vaginal bleeding affecting early pregnancy FINDINGS: UTERUS: No focal myometrial mass. GESTATIONAL SAC(S): Small gestational sac in the upper endometrium, measuring 10 x 10 x 13 mm (mean sac diameter of 10.9 mm). No subchorionic hemorrhage. YOLK SAC: Present. EMBRYO(<11WK) /FETUS(>=11WK): No fetal pole or cardiac activity present, at this time. CROWN RUMP LENGTH: Not measured. RATE OF CARDIAC ACTIVITY: Not measured. RIGHT OVARY: Unremarkable. LEFT OVARY: Unremarkable. FREE FLUID: Trace free fluid in the pelvis, likely physiologic in a female this age. MEASUREMENTS  ESTIMATED GESTATIONAL AGE BY CURRENT ULTRASOUND: 5 weeks and 6 days. IMPRESSION: 1. Single intrauterine gestation with a yolk sac present and an estimated gestational age of [redacted] weeks and 6 days. No fetal pole or fetal cardiac activity present, likely due to the earliness of the gestation. Repeat ultrasound in 14 days recommended to assess for progression in the pregnancy. Electronically signed by: Rogelia Myers MD 07/16/2024 09:34 AM EST RP Workstation: [id number]    MAU COURSE EKG and Troponin negative D-dimer negative CMP WNL Anemia noted Given Protonix  GI cocktail Lasix  CXR shows possible vascular congestion Lipase BNP Care turned over to [facility]. [place]  ASSESSMENT 1. Supervision of high risk pregnancy, antepartum   2. Severe obesity due to excess calories affecting pregnancy, antepartum (HCC)   3. Other chest pain   4. [redacted] weeks gestation of pregnancy   5. Morbid obesity (HCC)   6. Syncope, unspecified syncope type     PLAN   Fredirick Glenys RAMAN, MD 08/12/2024 8:55 PM  As workup benign tonight and relief of GI symptoms effective for pain, will send Protonix  40mg  for use at home daily for GERD in pregnancy. Advised that a Ambulatory referral to [facility] cardiology has been placed.   Work note provided for kerr-mcgee and tomorrow.  Protonix  40mg  PO daily for GERD sent to preferred pharmacy.   Camie Rote, MSN, CNM, RNC-OB Certified Nurse Midwife, [facility] 08/12/2024 10:30 PM      [1]  No current facility-administered medications on file prior to encounter.   Current Outpatient Medications on File Prior to Encounter  Medication Sig Dispense Refill   albuterol  (VENTOLIN  HFA) 108 (90 Base) MCG/ACT inhaler Inhale 2 puffs into the lungs every 6 (six) hours as needed for wheezing or shortness of breath. 8 g 0   ARIPiprazole (ABILIFY) 15 MG tablet Take 15 mg by mouth at bedtime.     cetirizine  (ZYRTEC ) 10 MG tablet Take 1 tablet (10 mg total) by mouth daily. 90  tablet 0   Doxylamine -Pyridoxine  (DICLEGIS ) 10-10 MG TBEC Take 1 tablet by mouth 2 (two) times daily as needed. 60 tablet 0   fluticasone  (FLONASE ) 50 MCG/ACT nasal spray Place 2 sprays into both nostrils daily. 16 g 0   montelukast  (SINGULAIR ) 10 MG tablet Take 10 mg by mouth daily.     NIFEdipine  (PROCARDIA -XL/NIFEDICAL-XL) 30 MG 24 hr tablet Take 1 tablet (30 mg total) by mouth daily. 60 tablet 3   pantoprazole  (PROTONIX ) 20 MG tablet Take 20 mg by mouth daily.     sertraline  (ZOLOFT ) 100 MG tablet Take 100 mg by mouth daily.     SUMAtriptan  (IMITREX ) 100 MG tablet Take 100 mg by mouth once.     SYMBICORT 80-4.5 MCG/ACT inhaler  Inhale 2 puffs into the lungs 2 (two) times daily.     Blood Pressure Monitoring (BLOOD PRESSURE KIT) DEVI 1 Device by Does not apply route once a week. 1 each 0   traZODone (DESYREL) 100 MG tablet Take 100 mg by mouth at bedtime.    [2]  Allergies Allergen Reactions   Shellfish Allergy Anaphylaxis    Throat swelling

## 2024-08-12 NOTE — Progress Notes (Signed)
 Written and verbal d/c instructions given and pt voiced understanding.

## 2024-08-17 ENCOUNTER — Inpatient Hospital Stay (HOSPITAL_COMMUNITY)
Admission: AD | Admit: 2024-08-17 | Discharge: 2024-08-18 | Disposition: A | Discharge status: Home / Self Care | Attending: Obstetrics and Gynecology | Admitting: Obstetrics and Gynecology

## 2024-08-17 ENCOUNTER — Inpatient Hospital Stay (HOSPITAL_COMMUNITY)

## 2024-08-17 DIAGNOSIS — Z3A10 10 weeks gestation of pregnancy: Secondary | ICD-10-CM | POA: Diagnosis not present

## 2024-08-17 DIAGNOSIS — Y92481 Parking lot as the place of occurrence of the external cause: Secondary | ICD-10-CM | POA: Insufficient documentation

## 2024-08-17 DIAGNOSIS — O26891 Other specified pregnancy related conditions, first trimester: Secondary | ICD-10-CM | POA: Diagnosis not present

## 2024-08-17 DIAGNOSIS — S3991XA Unspecified injury of abdomen, initial encounter: Secondary | ICD-10-CM | POA: Diagnosis not present

## 2024-08-17 DIAGNOSIS — O0990 Supervision of high risk pregnancy, unspecified, unspecified trimester: Secondary | ICD-10-CM

## 2024-08-17 DIAGNOSIS — O9A211 Injury, poisoning and certain other consequences of external causes complicating pregnancy, first trimester: Secondary | ICD-10-CM | POA: Diagnosis present
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
## 2024-08-18 DIAGNOSIS — Z3A10 10 weeks gestation of pregnancy: Secondary | ICD-10-CM | POA: Diagnosis not present

## 2024-08-18 DIAGNOSIS — S3991XA Unspecified injury of abdomen, initial encounter: Secondary | ICD-10-CM | POA: Diagnosis not present

## 2024-08-18 DIAGNOSIS — O26891 Other specified pregnancy related conditions, first trimester: Secondary | ICD-10-CM

## 2024-08-18 NOTE — Progress Notes (Signed)
 Claris Cedar CNM in to see pt earlier and discuss d/c plan. Written and verbal d/c instructions given by CNM and pt then d/c home by provider.
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
## 2024-08-23 ENCOUNTER — Encounter: Payer: Self-pay | Admitting: *Deleted

## 2024-08-24 ENCOUNTER — Ambulatory Visit

## 2024-08-24 ENCOUNTER — Encounter: Payer: Self-pay | Admitting: Cardiology

## 2024-08-24 ENCOUNTER — Ambulatory Visit: Admitting: Cardiology

## 2024-08-24 VITALS — BP 140/90 | HR 95 | Ht 65.0 in | Wt >= 6400 oz

## 2024-08-24 DIAGNOSIS — R002 Palpitations: Secondary | ICD-10-CM

## 2024-08-24 DIAGNOSIS — Z7689 Persons encountering health services in other specified circumstances: Secondary | ICD-10-CM

## 2024-08-24 MED ORDER — ASPIRIN 81 MG PO TBEC: 81.0000 mg | DELAYED_RELEASE_TABLET | Freq: Every day | ORAL | Status: AC

## 2024-08-24 MED ORDER — NIFEDIPINE ER OSMOTIC RELEASE 60 MG PO TB24: 60.0000 mg | ORAL_TABLET | Freq: Every day | ORAL | 3 refills | Status: AC

## 2024-08-24 NOTE — Progress Notes (Unsigned)
 Enrolled patient for a 14 day Zio XT  monitor to be mailed to patients home

## 2024-08-26 ENCOUNTER — Ambulatory Visit: Payer: Self-pay | Admitting: Obstetrics and Gynecology

## 2024-08-26 ENCOUNTER — Encounter: Payer: Self-pay | Admitting: Obstetrics and Gynecology

## 2024-08-26 VITALS — BP 180/95 | HR 97 | Wt >= 6400 oz

## 2024-08-26 DIAGNOSIS — Z8759 Personal history of other complications of pregnancy, childbirth and the puerperium: Secondary | ICD-10-CM | POA: Insufficient documentation

## 2024-08-26 DIAGNOSIS — Z131 Encounter for screening for diabetes mellitus: Secondary | ICD-10-CM

## 2024-08-26 DIAGNOSIS — F3161 Bipolar disorder, current episode mixed, mild: Secondary | ICD-10-CM

## 2024-08-26 DIAGNOSIS — O0990 Supervision of high risk pregnancy, unspecified, unspecified trimester: Secondary | ICD-10-CM

## 2024-08-26 DIAGNOSIS — O99210 Obesity complicating pregnancy, unspecified trimester: Secondary | ICD-10-CM

## 2024-08-26 DIAGNOSIS — O09299 Supervision of pregnancy with other poor reproductive or obstetric history, unspecified trimester: Secondary | ICD-10-CM

## 2024-08-26 DIAGNOSIS — F411 Generalized anxiety disorder: Secondary | ICD-10-CM

## 2024-08-26 DIAGNOSIS — Z98891 History of uterine scar from previous surgery: Secondary | ICD-10-CM

## 2024-08-26 MED ORDER — LABETALOL HCL 400 MG PO TABS: 400.0000 mg | ORAL_TABLET | Freq: Two times a day (BID) | ORAL | 0 refills | Status: AC

## 2024-08-26 NOTE — Progress Notes (Signed)
 " Subjective:    [name] [name] is a [id number] [redacted]w[redacted]d being seen today for her first obstetrical visit.  Her obstetrical history is significant for advanced maternal age, maternal obesity, previous cesarean section due to fetal intolerance of labor followed by VBAC in 2023, history of gestational hypertension and now [facility] on procardia . Patient does intend to breast feed. Pregnancy history fully reviewed.  Patient reports no complaints.  Vitals:   08/26/24 0935  BP: (!) 180/95  Pulse: 97  Weight: (!) 409 lb (185.5 kg)    HISTORY: OB History  Gravida Para Term Preterm AB Living  6 3 2 1 2 3   SAB IAB Ectopic Multiple Live Births  2 0 0 0 3    # Outcome Date GA Lbr Len/2nd Weight Sex Type Anes PTL Lv  6 Current           5 Term 05/09/22 [redacted]w[redacted]d 09:05 / 00:25 7 lb 10.4 oz (3.47 kg) M Vag-Spont EPI  LIV     Birth Comments: WNL  4 Preterm 06/15/12 [redacted]w[redacted]d  5 lb 6 oz (2.438 kg) M CS-LTranv Spinal  LIV     Complications: Fetal Intolerance  3 Term 05/01/08 [redacted]w[redacted]d  10 lb 11 oz (4.848 kg) F Vag-Spont None N LIV  2 SAB              Birth Comments: System Generated. Please review and update pregnancy details.  1 SAB              Birth Comments: System Generated. Please review and update pregnancy details.   Past Medical History:  Diagnosis Date   Abnormal Pap smear    Alpha thalassemia silent carrier 12/04/2021   FOB___   Anxiety    Anxiety and depression 02/01/2022   Asthma    Bipolar 1 disorder (HCC)    Borderline personality disorder (HCC)    Depression    GERD (gastroesophageal reflux disease)    IUD (intrauterine device) in place 04/12/2013   Had IUD inserted 03/29/13 could not feel strings went to ER 9/18 and KUB saw IUD still could not see string, can't see now but seen in US    Kidney infection    Morbid obesity (HCC)    Pregnancy    Pregnancy induced hypertension    PTSD (post-traumatic stress disorder)    UTI (urinary tract infection)    Past Surgical History:  Procedure  Laterality Date   CESAREAN SECTION     HYSTEROSCOPY N/A 01/09/2021   Procedure: HYSTEROSCOPY;  Surgeon: Marilynn Nest, DO;  Location: [facility];  Service: Gynecology;  Laterality: N/A;   IUD REMOVAL N/A 01/09/2021   Procedure: INTRAUTERINE DEVICE (IUD) REMOVAL;  Surgeon: Ozan, Jennifer, DO;  Location: [facility];  Service: Gynecology;  Laterality: N/A;   TOOTH EXTRACTION     Family History  Problem Relation Age of Onset   Hypertension Mother    Hypertension Father    Diabetes Father    Congestive Heart Failure Father    ADD / ADHD Son    Diabetes Paternal Aunt    Diabetes Paternal Uncle    Hypertension Paternal Grandmother    Diabetes Paternal Grandmother    Diabetes Paternal Grandfather      Exam    Uterus:     Pelvic Exam:    Perineum: No Hemorrhoids, Normal Perineum   Vulva: normal   Vagina:  normal mucosa, normal discharge   pH:    Cervix: multiparous appearance and cervix is closed and long  Adnexa: normal adnexa and no mass, fullness, tenderness   Bony Pelvis: gynecoid  System: Breast:  normal appearance, no masses or tenderness   Skin: normal coloration and turgor, no rashes    Neurologic: oriented, no focal deficits   Extremities: normal strength, tone, and muscle mass   HEENT extra ocular movement intact   Mouth/Teeth mucous membranes moist, pharynx normal without lesions and dental hygiene good   Neck supple and no masses   Cardiovascular: regular rate and rhythm   Respiratory:  appears well, vitals normal, no respiratory distress, acyanotic, normal RR   Abdomen: soft, non-tender; bowel sounds normal; no masses,  no organomegaly   Urinary:       Assessment:    Pregnancy: [id number] Patient Active Problem List   Diagnosis Date Noted   History of gestational hypertension 08/26/2024   Maternal obesity affecting pregnancy, antepartum 08/12/2024   Supervision of high risk pregnancy, antepartum 08/03/2024   DOE (dyspnea on exertion) 10/31/2022   Long term current  use of antipsychotic medication 06/24/2022   Cannabis use disorder 06/24/2022   Severe episode of recurrent major depressive disorder, without psychotic features (HCC) 04/19/2022   PTSD (post-traumatic stress disorder) 04/19/2022   Asthma 01/08/2022   Bipolar disorder (HCC) 11/14/2021   Generalized anxiety disorder 11/14/2021   Previous cesarean section 11/14/2021   Morbid obesity (HCC)         Plan:     Initial labs drawn. Prenatal vitamins. Problem list reviewed and updated. Genetic Screening discussed : ordered.  Ultrasound discussed; fetal survey: ordered. Start ASA. Patient is established with Ob cardio Per incomplete cardiology note, patient should be taking labetalol  and procardia  60 mg. Patient is unaware of labetalol  rx. Prescription sent to her pharmacy Will have patient return next week for BP check  Follow up in 4 weeks. 50% of 30 min visit spent on counseling and coordination of care.     Infant Doane 08/26/2024   "

## 2024-08-27 ENCOUNTER — Ambulatory Visit: Payer: Self-pay | Admitting: Obstetrics and Gynecology

## 2024-08-27 DIAGNOSIS — R7309 Other abnormal glucose: Secondary | ICD-10-CM | POA: Insufficient documentation

## 2024-08-27 DIAGNOSIS — O0990 Supervision of high risk pregnancy, unspecified, unspecified trimester: Secondary | ICD-10-CM

## 2024-08-27 LAB — HEMOGLOBIN A1C
Est. average glucose Bld gHb Est-mCnc: 117 mg/dL
Hgb A1c MFr Bld: 5.7 % — ABNORMAL HIGH (ref 4.8–5.6)

## 2024-08-27 LAB — CBC/D/PLT+RPR+RH+ABO+RUBIGG...
Antibody Screen: NEGATIVE
Basophils Absolute: 0.1 10*3/uL (ref 0.0–0.2)
Basos: 1 %
EOS (ABSOLUTE): 0.1 10*3/uL (ref 0.0–0.4)
Eos: 1 %
HCV Ab: NONREACTIVE
HIV Screen 4th Generation wRfx: NONREACTIVE
Hematocrit: 40.8 % (ref 34.0–46.6)
Hemoglobin: 11.9 g/dL (ref 11.1–15.9)
Hepatitis B Surface Ag: NEGATIVE
Immature Grans (Abs): 0.1 10*3/uL (ref 0.0–0.1)
Immature Granulocytes: 1 %
Lymphocytes Absolute: 2.1 10*3/uL (ref 0.7–3.1)
Lymphs: 20 %
MCH: 22.1 pg — ABNORMAL LOW (ref 26.6–33.0)
MCHC: 29.2 g/dL — ABNORMAL LOW (ref 31.5–35.7)
MCV: 76 fL — ABNORMAL LOW (ref 79–97)
Monocytes Absolute: 0.6 10*3/uL (ref 0.1–0.9)
Monocytes: 6 %
Neutrophils Absolute: 7.5 10*3/uL — ABNORMAL HIGH (ref 1.4–7.0)
Neutrophils: 71 %
Platelets: 333 10*3/uL (ref 150–450)
RBC: 5.38 x10E6/uL — ABNORMAL HIGH (ref 3.77–5.28)
RDW: 19.6 % — ABNORMAL HIGH (ref 11.7–15.4)
RPR Ser Ql: NONREACTIVE
Rh Factor: NEGATIVE
Rubella Antibodies, IGG: >33 {index}
WBC: 10.5 10*3/uL (ref 3.4–10.8)

## 2024-08-27 LAB — COMPREHENSIVE METABOLIC PANEL WITH GFR
ALT: 21 [IU]/L (ref 0–32)
AST: 19 [IU]/L (ref 0–40)
Albumin: 3.9 g/dL (ref 3.9–4.9)
Alkaline Phosphatase: 90 [IU]/L (ref 41–116)
BUN/Creatinine Ratio: 16 (ref 9–23)
BUN: 12 mg/dL (ref 6–20)
Bilirubin Total: 0.2 mg/dL (ref 0.0–1.2)
CO2: 20 mmol/L (ref 20–29)
Calcium: 8.9 mg/dL (ref 8.7–10.2)
Chloride: 102 mmol/L (ref 96–106)
Creatinine, Ser: 0.76 mg/dL (ref 0.57–1.00)
Globulin, Total: 2.9 g/dL (ref 1.5–4.5)
Glucose: 76 mg/dL (ref 70–99)
Potassium: 4.5 mmol/L (ref 3.5–5.2)
Sodium: 140 mmol/L (ref 134–144)
Total Protein: 6.8 g/dL (ref 6.0–8.5)
eGFR: 104 mL/min/{1.73_m2}

## 2024-08-27 LAB — HCV INTERPRETATION

## 2024-09-02 ENCOUNTER — Ambulatory Visit: Payer: Self-pay

## 2024-09-23 ENCOUNTER — Encounter: Payer: Self-pay | Admitting: Obstetrics and Gynecology

## 2024-10-27 ENCOUNTER — Other Ambulatory Visit

## 2024-10-27 ENCOUNTER — Ambulatory Visit

## 2024-12-28 ENCOUNTER — Ambulatory Visit: Payer: Self-pay | Admitting: Neurology
